# Patient Record
Sex: Female | Born: 1935
Health system: Southern US, Community
[De-identification: ages and names within clinical notes are randomized; demographics above are authoritative.]

## PROBLEM LIST (undated history)

## (undated) DIAGNOSIS — M12819 Other specific arthropathies, not elsewhere classified, unspecified shoulder: Secondary | ICD-10-CM

## (undated) DIAGNOSIS — Z923 Personal history of irradiation: Secondary | ICD-10-CM

## (undated) DIAGNOSIS — M069 Rheumatoid arthritis, unspecified: Secondary | ICD-10-CM

## (undated) DIAGNOSIS — I499 Cardiac arrhythmia, unspecified: Secondary | ICD-10-CM

## (undated) DIAGNOSIS — R51 Headache: Secondary | ICD-10-CM

## (undated) DIAGNOSIS — G709 Myoneural disorder, unspecified: Secondary | ICD-10-CM

## (undated) DIAGNOSIS — K219 Gastro-esophageal reflux disease without esophagitis: Secondary | ICD-10-CM

## (undated) DIAGNOSIS — R42 Dizziness and giddiness: Secondary | ICD-10-CM

## (undated) DIAGNOSIS — J45909 Unspecified asthma, uncomplicated: Secondary | ICD-10-CM

## (undated) DIAGNOSIS — E119 Type 2 diabetes mellitus without complications: Secondary | ICD-10-CM

## (undated) DIAGNOSIS — F419 Anxiety disorder, unspecified: Secondary | ICD-10-CM

## (undated) DIAGNOSIS — I10 Essential (primary) hypertension: Secondary | ICD-10-CM

## (undated) DIAGNOSIS — M75100 Unspecified rotator cuff tear or rupture of unspecified shoulder, not specified as traumatic: Secondary | ICD-10-CM

## (undated) DIAGNOSIS — J309 Allergic rhinitis, unspecified: Secondary | ICD-10-CM

## (undated) DIAGNOSIS — R519 Headache, unspecified: Secondary | ICD-10-CM

## (undated) DIAGNOSIS — M8580 Other specified disorders of bone density and structure, unspecified site: Secondary | ICD-10-CM

## (undated) DIAGNOSIS — I4891 Unspecified atrial fibrillation: Secondary | ICD-10-CM

## (undated) DIAGNOSIS — R569 Unspecified convulsions: Secondary | ICD-10-CM

## (undated) HISTORY — PX: CATARACT EXTRACTION: SUR2

## (undated) HISTORY — DX: Allergic rhinitis, unspecified: J30.9

## (undated) HISTORY — PX: TUBAL LIGATION: SHX77

## (undated) HISTORY — DX: Headache: R51

## (undated) HISTORY — DX: Anxiety disorder, unspecified: F41.9

## (undated) HISTORY — DX: Type 2 diabetes mellitus without complications: E11.9

## (undated) HISTORY — DX: Myoneural disorder, unspecified: G70.9

## (undated) HISTORY — DX: Cardiac arrhythmia, unspecified: I49.9

## (undated) HISTORY — DX: Rheumatoid arthritis, unspecified: M06.9

## (undated) HISTORY — PX: EYE SURGERY: SHX253

## (undated) HISTORY — DX: Headache, unspecified: R51.9

## (undated) HISTORY — DX: Other specified disorders of bone density and structure, unspecified site: M85.80

## (undated) HISTORY — DX: Gastro-esophageal reflux disease without esophagitis: K21.9

## (undated) HISTORY — DX: Essential (primary) hypertension: I10

## (undated) HISTORY — DX: Personal history of irradiation: Z92.3

## (undated) HISTORY — DX: Dizziness and giddiness: R42

## (undated) SURGERY — ESOPHAGOGASTRODUODENOSCOPY (EGD) WITH PROPOFOL
Anesthesia: Monitor Anesthesia Care

---

## 2005-11-24 ENCOUNTER — Emergency Department (HOSPITAL_COMMUNITY): Admission: EM | Admit: 2005-11-24 | Discharge: 2005-11-25 | Payer: Self-pay | Admitting: Emergency Medicine

## 2005-11-25 ENCOUNTER — Encounter: Admission: RE | Admit: 2005-11-25 | Discharge: 2005-11-25 | Payer: Self-pay | Admitting: Gastroenterology

## 2006-01-21 ENCOUNTER — Emergency Department (HOSPITAL_COMMUNITY): Admission: EM | Admit: 2006-01-21 | Discharge: 2006-01-22 | Payer: Self-pay | Admitting: Emergency Medicine

## 2006-04-25 ENCOUNTER — Encounter: Admission: RE | Admit: 2006-04-25 | Discharge: 2006-07-24 | Payer: Self-pay | Admitting: Family Medicine

## 2008-02-19 ENCOUNTER — Encounter: Admission: RE | Admit: 2008-02-19 | Discharge: 2008-02-19 | Payer: Self-pay | Admitting: Family Medicine

## 2008-07-26 ENCOUNTER — Encounter
Admission: RE | Admit: 2008-07-26 | Discharge: 2008-07-26 | Payer: Self-pay | Admitting: Physical Medicine and Rehabilitation

## 2009-05-02 ENCOUNTER — Encounter: Admission: RE | Admit: 2009-05-02 | Discharge: 2009-05-02 | Payer: Self-pay | Admitting: Family Medicine

## 2010-11-04 ENCOUNTER — Other Ambulatory Visit: Payer: Self-pay | Admitting: Family Medicine

## 2010-11-04 DIAGNOSIS — Z1231 Encounter for screening mammogram for malignant neoplasm of breast: Secondary | ICD-10-CM

## 2010-11-26 ENCOUNTER — Ambulatory Visit
Admission: RE | Admit: 2010-11-26 | Discharge: 2010-11-26 | Disposition: A | Discharge status: Home / Self Care | Payer: PRIVATE HEALTH INSURANCE | Source: Ambulatory Visit | Attending: Family Medicine | Admitting: Family Medicine

## 2010-11-26 DIAGNOSIS — Z1231 Encounter for screening mammogram for malignant neoplasm of breast: Secondary | ICD-10-CM

## 2010-12-07 ENCOUNTER — Other Ambulatory Visit: Payer: Self-pay | Admitting: Family Medicine

## 2010-12-07 DIAGNOSIS — R928 Other abnormal and inconclusive findings on diagnostic imaging of breast: Secondary | ICD-10-CM

## 2010-12-22 ENCOUNTER — Ambulatory Visit
Admission: RE | Admit: 2010-12-22 | Discharge: 2010-12-22 | Disposition: A | Discharge status: Home / Self Care | Payer: Medicare Other | Source: Ambulatory Visit | Attending: Family Medicine | Admitting: Family Medicine

## 2010-12-22 DIAGNOSIS — R928 Other abnormal and inconclusive findings on diagnostic imaging of breast: Secondary | ICD-10-CM

## 2011-02-17 DIAGNOSIS — I272 Pulmonary hypertension, unspecified: Secondary | ICD-10-CM | POA: Insufficient documentation

## 2011-02-17 DIAGNOSIS — R079 Chest pain, unspecified: Secondary | ICD-10-CM | POA: Insufficient documentation

## 2011-05-11 ENCOUNTER — Other Ambulatory Visit (HOSPITAL_COMMUNITY)
Admission: RE | Admit: 2011-05-11 | Discharge: 2011-05-11 | Disposition: A | Discharge status: Home / Self Care | Payer: Medicare Other | Source: Ambulatory Visit | Attending: Obstetrics and Gynecology | Admitting: Obstetrics and Gynecology

## 2011-05-11 DIAGNOSIS — Z124 Encounter for screening for malignant neoplasm of cervix: Secondary | ICD-10-CM | POA: Insufficient documentation

## 2011-06-01 ENCOUNTER — Other Ambulatory Visit: Payer: Self-pay | Admitting: Family Medicine

## 2011-06-01 DIAGNOSIS — N6489 Other specified disorders of breast: Secondary | ICD-10-CM

## 2011-06-10 ENCOUNTER — Ambulatory Visit
Admission: RE | Admit: 2011-06-10 | Discharge: 2011-06-10 | Disposition: A | Discharge status: Home / Self Care | Payer: Medicare Other | Source: Ambulatory Visit | Attending: Family Medicine | Admitting: Family Medicine

## 2011-06-10 ENCOUNTER — Other Ambulatory Visit: Payer: Self-pay | Admitting: Family Medicine

## 2011-06-10 DIAGNOSIS — R059 Cough, unspecified: Secondary | ICD-10-CM

## 2011-06-10 DIAGNOSIS — R05 Cough: Secondary | ICD-10-CM

## 2011-06-24 ENCOUNTER — Ambulatory Visit
Admission: RE | Admit: 2011-06-24 | Discharge: 2011-06-24 | Disposition: A | Discharge status: Home / Self Care | Payer: Medicare Other | Source: Ambulatory Visit | Attending: Family Medicine | Admitting: Family Medicine

## 2011-06-24 DIAGNOSIS — N6489 Other specified disorders of breast: Secondary | ICD-10-CM

## 2011-12-22 ENCOUNTER — Other Ambulatory Visit: Payer: Self-pay | Admitting: Family Medicine

## 2011-12-22 DIAGNOSIS — N6489 Other specified disorders of breast: Secondary | ICD-10-CM

## 2011-12-30 ENCOUNTER — Ambulatory Visit
Admission: RE | Admit: 2011-12-30 | Discharge: 2011-12-30 | Disposition: A | Discharge status: Home / Self Care | Payer: Medicare Other | Source: Ambulatory Visit | Attending: Family Medicine | Admitting: Family Medicine

## 2011-12-30 DIAGNOSIS — N6489 Other specified disorders of breast: Secondary | ICD-10-CM

## 2012-07-20 ENCOUNTER — Encounter: Payer: Self-pay | Admitting: *Deleted

## 2012-07-24 ENCOUNTER — Ambulatory Visit (INDEPENDENT_AMBULATORY_CARE_PROVIDER_SITE_OTHER): Payer: Medicare Other | Admitting: Internal Medicine

## 2012-07-24 ENCOUNTER — Encounter: Payer: Self-pay | Admitting: Internal Medicine

## 2012-07-24 VITALS — BP 120/60 | HR 60 | Temp 98.1°F | Ht 62.75 in | Wt 147.0 lb

## 2012-07-24 DIAGNOSIS — J45909 Unspecified asthma, uncomplicated: Secondary | ICD-10-CM

## 2012-07-24 DIAGNOSIS — R05 Cough: Secondary | ICD-10-CM

## 2012-07-24 DIAGNOSIS — J45991 Cough variant asthma: Secondary | ICD-10-CM | POA: Insufficient documentation

## 2012-07-24 DIAGNOSIS — R059 Cough, unspecified: Secondary | ICD-10-CM

## 2012-07-24 DIAGNOSIS — J4520 Mild intermittent asthma, uncomplicated: Secondary | ICD-10-CM

## 2012-07-24 DIAGNOSIS — R058 Other specified cough: Secondary | ICD-10-CM | POA: Insufficient documentation

## 2012-07-24 MED ORDER — FAMOTIDINE 20 MG PO TABS: ORAL_TABLET | ORAL | Status: DC

## 2012-07-24 MED ORDER — PANTOPRAZOLE SODIUM 40 MG PO TBEC: 40.0000 mg | DELAYED_RELEASE_TABLET | Freq: Every day | ORAL | Status: DC

## 2012-07-24 MED ORDER — PREDNISONE (PAK) 10 MG PO TABS: ORAL_TABLET | ORAL | Status: DC

## 2012-07-24 MED ORDER — MOMETASONE FURO-FORMOTEROL FUM 100-5 MCG/ACT IN AERO: INHALATION_SPRAY | RESPIRATORY_TRACT | Status: DC

## 2012-07-24 NOTE — Progress Notes (Signed)
  Subjective:    Patient ID: Lorraine Gilbert, female    DOB: 28-Apr-1932  MRN: [id number]  HPI  31 yf from [place] never smoker with cough and sob x decades previously eval by Dr Jethro Bolus referred by Dr Azucena Cecil 07/24/2012 for further evaluation.  07/24/2012 1st pulmonary eval cc cough x 2 months indolent onset progressively worse that is not better on advair, min prod of white mucus more day than night, assoc with mild sob.  No obvious daytime variabilty or assoc  cp or chest tightness, subjective wheeze overt sinus or hb symptoms. No unusual exp hx or h/o childhood pna/ asthma or knowledge of premature birth.   Sleeping ok without nocturnal  or early am exacerbation  of respiratory  c/o's or need for noct saba. Also denies any obvious fluctuation of symptoms with weather or environmental changes or other aggravating or alleviating factors except as outlined above     Review of Systems  Constitutional: Negative for fever, chills and unexpected weight change.  HENT: Negative for ear pain, nosebleeds, congestion, sore throat, rhinorrhea, sneezing, trouble swallowing, dental problem, voice change, postnasal drip and sinus pressure.   Eyes: Negative for visual disturbance.  Respiratory: Positive for cough and shortness of breath. Negative for choking.   Cardiovascular: Negative for chest pain and leg swelling.  Gastrointestinal: Negative for vomiting, abdominal pain and diarrhea.  Genitourinary: Negative for difficulty urinating.  Musculoskeletal: Negative for arthralgias.  Skin: Negative for rash.  Neurological: Negative for tremors, syncope and headaches.  Hematological: Does not bruise/bleed easily.       Objective:   Physical Exam  Hoarse amb pr female nad Wt Readings from Last 3 Encounters:  07/24/12 147 lb (66.679 kg)     HEENT: nl dentition, turbinates, and orophanx. Nl external ear canals without cough reflex   NECK :  without JVD/Nodes/TM/ nl carotid upstrokes  bilaterally   LUNGS: no acc muscle use, clear to A and P bilaterally without cough on insp or exp maneuvers   CV:  RRR  no s3 or murmur or increase in P2, no edema   ABD:  soft and nontender with nl excursion in the supine position. No bruits or organomegaly, bowel sounds nl  MS:  warm without deformities, calf tenderness, cyanosis or clubbing  SKIN: warm and dry without lesions    NEURO:  alert, depressed,  no deficits    rec xr 07/24/12 > did not go      Assessment & Plan:

## 2012-07-24 NOTE — Patient Instructions (Addendum)
Stop advair  Start dulera 100 Take 2 puffs first thing in am and then another 2 puffs about 12 hours later.     Pantoprazole (protonix) 40 mg  Take 30-60 min before first meal of the day and Pepcid 20 mg one bedtime until return to office - this is the best way to tell whether stomach acid is contributing to your problem.    GERD (REFLUX)  is an extremely common cause of respiratory symptoms, many times with no significant heartburn at all.    It can be treated with medication, but also with lifestyle changes including avoidance of late meals, excessive alcohol, smoking cessation, and avoid fatty foods, chocolate, peppermint, colas, red wine, and acidic juices such as orange juice.  NO MINT OR MENTHOL PRODUCTS SO NO COUGH DROPS  USE SUGARLESS CANDY INSTEAD (jolley ranchers or Stover's)  NO OIL BASED VITAMINS - use powdered substitutes.  If not better return here in 2 weeks all bottles/inhalers from all doctors or return to [facility] and if you want to change to [facility] we will need you to bring your records with you as well

## 2012-07-26 NOTE — Assessment & Plan Note (Addendum)
DDX of  difficult airways managment all start with A and  include Adherence, Ace Inhibitors, Acid Reflux, Active Sinus Disease, Alpha 1 Antitripsin deficiency, Anxiety masquerading as Airways dz,  ABPA,  allergy(esp in young), Aspiration (esp in elderly), Adverse effects of DPI,  Active smokers, plus two Bs  = Bronchiectasis and Beta blocker use..and one C= CHF   Adherence is always the initial "prime suspect" and is a multilayered concern that requires a "trust but verify" approach in every patient - starting with knowing how to use medications, especially inhalers, correctly, keeping up with refills and understanding the fundamental difference between maintenance and prns vs those medications only taken for a very short course and then stopped and not refilled. The proper method of use, as well as anticipated side effects, of a metered-dose inhaler are discussed and demonstrated to the patient. Improved effectiveness after extensive coaching during this visit to a level of approximately  50% so try dulera 100 .2bid x 2 weeks then regroup with med reconciliation  ? Acid reflux in obese pt > max acid suppression plus diet  ? Adverse effect of advair > try hfa  ? Allergies > will need eval from [facility] if she wants to change to here.    Each maintenance medication was reviewed in detail including most importantly the difference between maintenance and as needed and under what circumstances the prns are to be used.  Please see instructions for details which were reviewed in writing and the patient given a copy.

## 2012-08-07 ENCOUNTER — Ambulatory Visit: Payer: Medicare Other | Admitting: Internal Medicine

## 2012-08-25 ENCOUNTER — Ambulatory Visit: Payer: Medicare Other | Admitting: Internal Medicine

## 2013-01-02 ENCOUNTER — Other Ambulatory Visit: Payer: Self-pay

## 2013-01-02 DIAGNOSIS — Z1231 Encounter for screening mammogram for malignant neoplasm of breast: Secondary | ICD-10-CM

## 2013-02-02 ENCOUNTER — Ambulatory Visit
Admission: RE | Admit: 2013-02-02 | Discharge: 2013-02-02 | Disposition: A | Discharge status: Home / Self Care | Payer: Medicare Other | Source: Ambulatory Visit

## 2013-02-02 ENCOUNTER — Other Ambulatory Visit: Payer: Self-pay

## 2013-02-02 DIAGNOSIS — Z1231 Encounter for screening mammogram for malignant neoplasm of breast: Secondary | ICD-10-CM

## 2013-02-21 ENCOUNTER — Other Ambulatory Visit: Payer: Self-pay | Admitting: Gastroenterology

## 2013-02-21 DIAGNOSIS — R1013 Epigastric pain: Secondary | ICD-10-CM

## 2013-02-28 ENCOUNTER — Ambulatory Visit
Admission: RE | Admit: 2013-02-28 | Discharge: 2013-02-28 | Disposition: A | Discharge status: Home / Self Care | Payer: Medicare Other | Source: Ambulatory Visit | Attending: Gastroenterology | Admitting: Gastroenterology

## 2013-02-28 DIAGNOSIS — R1013 Epigastric pain: Secondary | ICD-10-CM

## 2013-06-05 ENCOUNTER — Encounter: Payer: Medicare Other | Attending: Family Medicine

## 2013-06-05 VITALS — Ht 63.0 in | Wt 142.6 lb

## 2013-06-05 DIAGNOSIS — E119 Type 2 diabetes mellitus without complications: Secondary | ICD-10-CM | POA: Insufficient documentation

## 2013-06-05 DIAGNOSIS — Z713 Dietary counseling and surveillance: Secondary | ICD-10-CM | POA: Insufficient documentation

## 2013-06-05 NOTE — Progress Notes (Signed)
Patient was seen on 06/05/13 for the first of a series of three diabetes self-management courses at [facility].  Current HbA1c: 7.7%  The following learning objectives were met by the patient during this class:  Describe diabetes  State some common risk factors for diabetes  Defines the role of glucose and insulin  Identifies type of diabetes and pathophysiology  Describe the relationship between diabetes and cardiovascular risk  State the members of the Healthcare Team  States the rationale for glucose monitoring  State when to test glucose  State their individual Target Range  State the importance of logging glucose readings  Describe how to interpret glucose readings  Identifies A1C target  Explain the correlation between A1c and eAG values  State symptoms and treatment of high blood glucose  State symptoms and treatment of low blood glucose  Explain proper technique for glucose testing  Identifies proper sharps disposal  Handouts given during class include:  Living Well with Diabetes book  Carb Counting and Meal Planning book  Meal Plan Card  Carbohydrate guide  Meal planning worksheet  Low Sodium Flavoring Tips  The diabetes portion plate  T0C to eAG Conversion Chart  Diabetes Medications  Diabetes Recommended Care Schedule  Support Group  Diabetes Success Plan  Core Class Satisfaction Survey  Follow-Up Plan:  Attend core 2

## 2013-06-12 DIAGNOSIS — E119 Type 2 diabetes mellitus without complications: Secondary | ICD-10-CM

## 2013-06-12 DIAGNOSIS — Z713 Dietary counseling and surveillance: Secondary | ICD-10-CM | POA: Diagnosis not present

## 2013-06-12 NOTE — Progress Notes (Signed)

## 2013-06-19 ENCOUNTER — Encounter: Payer: Medicare Other | Attending: Family Medicine

## 2013-06-19 DIAGNOSIS — Z713 Dietary counseling and surveillance: Secondary | ICD-10-CM | POA: Diagnosis present

## 2013-06-19 DIAGNOSIS — E119 Type 2 diabetes mellitus without complications: Secondary | ICD-10-CM

## 2013-06-20 NOTE — Progress Notes (Signed)
Patient was seen on 06/19/13 for the third of a series of three diabetes self-management courses at the [facility]. The following learning objectives were met by the patient during this class:    State the amount of activity recommended for healthy living   Describe activities suitable for individual needs   Identify ways to regularly incorporate activity into daily life   Identify barriers to activity and ways to over come these barriers  Identify diabetes medications being personally used and their primary action for lowering glucose and possible side effects   Describe role of stress on blood glucose and develop strategies to address psychosocial issues   Identify diabetes complications and ways to prevent them  Explain how to manage diabetes during illness   Evaluate success in meeting personal goal   Establish 2-3 goals that they will plan to diligently work on until they return for the  28-monthfollow-up visit  Goals:  Follow Diabetes Meal Plan as instructed  Aim for 15-30 mins of physical activity daily as tolerated  Bring food record and glucose log to your follow up visit  Your patient has established the following 4 month goals in their individualized success plan: I will count my carb choices at most meals and snacks I will increase my activity level at least 15 minutes 7  days a week Reduce my whole milk I will test my glucose at least 2 times a day, 7 days a week I will look at patterns in my record book at least 2 days a month To help manage stress I will go out shopping  Your patient has identified these potential barriers to change:  motivation  finances  Your patient has identified their diabetes self-care support plan as  [facility]  Family Support  Plan:  Attend Core 4 in 4 months

## 2013-10-18 ENCOUNTER — Ambulatory Visit (INDEPENDENT_AMBULATORY_CARE_PROVIDER_SITE_OTHER)
Admission: RE | Admit: 2013-10-18 | Discharge: 2013-10-18 | Disposition: A | Discharge status: Home / Self Care | Payer: Medicare Other | Source: Ambulatory Visit | Attending: Internal Medicine | Admitting: Internal Medicine

## 2013-10-18 ENCOUNTER — Ambulatory Visit (INDEPENDENT_AMBULATORY_CARE_PROVIDER_SITE_OTHER): Payer: Medicare Other | Admitting: Internal Medicine

## 2013-10-18 ENCOUNTER — Encounter: Payer: Self-pay | Admitting: Internal Medicine

## 2013-10-18 VITALS — BP 130/58 | HR 60 | Temp 98.1°F | Ht 63.0 in | Wt 133.2 lb

## 2013-10-18 DIAGNOSIS — R059 Cough, unspecified: Secondary | ICD-10-CM

## 2013-10-18 DIAGNOSIS — J45991 Cough variant asthma: Secondary | ICD-10-CM

## 2013-10-18 DIAGNOSIS — R05 Cough: Secondary | ICD-10-CM

## 2013-10-18 MED ORDER — PANTOPRAZOLE SODIUM 40 MG PO TBEC: DELAYED_RELEASE_TABLET | ORAL | Status: DC

## 2013-10-18 MED ORDER — PREDNISONE 10 MG PO TABS: ORAL_TABLET | ORAL | Status: DC

## 2013-10-18 NOTE — Assessment & Plan Note (Signed)
-   10/18/2013 p extensive coaching HFA effectiveness =    25% at best > resume trial of dulera 100 2bid   Based on inadequate hfa I strongly doubt this dx but will resume dulera until return and I have a chance to review last allergy notes  Probably needs MCT to sort out

## 2013-10-18 NOTE — Assessment & Plan Note (Addendum)
-   Note POS GERD on UGI  02/28/13     The most common causes of chronic cough in immunocompetent adults include the following: upper airway cough syndrome (UACS), previously referred to as postnasal drip syndrome (PNDS), which is caused by variety of rhinosinus conditions; (2) asthma; (3) GERD; (4) chronic bronchitis from cigarette smoking or other inhaled environmental irritants; (5) nonasthmatic eosinophilic bronchitis; and (6) bronchiectasis.   These conditions, singly or in combination, have accounted for up to 94% of the causes of chronic cough in prospective studies.   Other conditions have constituted no >6% of the causes in prospective studies These have included bronchogenic carcinoma, chronic interstitial pneumonia, sarcoidosis, left ventricular failure, ACEI-induced cough, and aspiration from a condition associated with pharyngeal dysfunction.    Chronic cough is often simultaneously caused by more than one condition. A single cause has been found from 38 to 82% of the time, multiple causes from 18 to 62%. Multiply caused cough has been the result of three diseases up to 42% of the time.       Based on hx and exam, this is most likely:  Classic Upper airway cough syndrome, so named because it's frequently impossible to sort out how much is  CR/sinusitis with freq throat clearing (which can be related to primary GERD)   vs  causing  secondary (" extra esophageal")  GERD from wide swings in gastric pressure that occur with throat clearing, often  promoting self use of mint and menthol lozenges that reduce the lower esophageal sphincter tone and exacerbate the problem further in a cyclical fashion.   These are the same pts (now being labeled as having "irritable larynx syndrome" by some cough centers) who not infrequently have a history of having failed to tolerate ace inhibitors,  dry powder inhalers or biphosphonates or report having atypical reflux symptoms that don't respond to standard  doses of PPI (the most likely scenario here)  , and are easily confused as having aecopd or asthma flares by even experienced allergists/ pulmonologists.   The first step is to maximize acid suppression and eliminate cyclical coughing to the extent possible s narcotics Also add h1 hs per guidelines for uacs

## 2013-10-18 NOTE — Patient Instructions (Addendum)
Protonix 40 mg Take 30- 60 min before your first and last meals of the day   Add pepcid 20 mg and chlortrimeton 4 mg x 2 at bedtime both are over the counter  Prednisone 10 mg take  4 each am x 2 days,   2 each am x 2 days,  1 each am x 2 days and stop   Last note from Dr Leanora Cover office  For cough > delsym 2 tsp evey 12 hours   GERD (REFLUX)  is an extremely common cause of respiratory symptoms, many times with no significant heartburn at all.    It can be treated with medication, but also with lifestyle changes including avoidance of late meals, excessive alcohol, smoking cessation, and avoid fatty foods, chocolate, peppermint, colas, red wine, and acidic juices such as orange juice.  NO MINT OR MENTHOL PRODUCTS SO NO COUGH DROPS  USE SUGARLESS CANDY INSTEAD (jolley ranchers or Stover's)  NO OIL BASED VITAMINS - use powdered substitutes. Elevate head of bed if possible  Work on inhaler technique:  relax and gently blow all the way out then take a nice smooth deep breath back in, triggering the inhaler at same time you start breathing in.  Hold for up to 5 seconds if you can.  Rinse and gargle with water when done  See Tammy NP w/in 2 weeks with all your medications, even over the counter meds, separated in two separate bags, the ones you take no matter what vs the ones you stop once you feel better and take only as needed when you feel you need them.   Tammy  will generate for you a new user friendly medication calendar that will put [place] all on the same page re: your medication use.     Without this process, it simply isn't possible to assure that we are providing  your outpatient care  with  the attention to detail we feel you deserve.   If we cannot assure that you're getting that kind of care,  then we cannot manage your problem effectively from this clinic.  Please remember to go to the  x-ray department downstairs for your tests - we will call you with the results when they are  available.     Once you have seen Tammy and we are sure that we're all on the same page with your medication use she will arrange follow up with me.

## 2013-10-18 NOTE — Progress Notes (Signed)
Subjective:    Patient ID: Lorraine Gilbert, female    DOB: 1933-01-05  MRN: [id number]   Brief patient profile:  79  yf from [place] never smoker with cough and sob x decades previously eval by Dr Bernita Buffy referred by Dr Moreen Fowler 07/24/2012 for further evaluation.   History of Present Illness  07/24/2012 1st pulmonary eval cc cough x 2 months indolent onset progressively worse that is not better on advair, min prod of white mucus more day than night, assoc with mild sob. rec Stop advair Start dulera 100 Take 2 puffs first thing in am and then another 2 puffs about 12 hours later.  Pantoprazole (protonix) 40 mg  Take 30-60 min before first meal of the day and Pepcid 20 mg one bedtime until return to office - this is the best way to tell whether stomach acid is contributing to your problem.   GERD   If not better return here in 2 weeks all bottles/inhalers from all doctors or return to [facility] and if you want to change to [facility] we will need you to bring your records with you as well >> did not return as rec    10/18/2013 extended ov/ re-establish care ov/Harvel [place] re: on ppi ac and dulera 100 but extremely poor hfa  Chief Complaint  Patient presents with  . Acute Visit    Pt c/o increased cough x 2 months- prod with minimal clear sputum. Breathing is unchanged since her last visit.   cough is worse p supper and at hs but not waking her once asleep or in am  Not limited by breathing from desired activities    No obvious day to day or daytime variabilty or assoc   cp or chest tightness, subjective wheeze overt sinus or hb symptoms. No unusual exp hx or h/o childhood pna/ asthma or knowledge of premature birth.  Sleeping ok without nocturnal  or early am exacerbation  of respiratory  c/o's or need for noct saba. Also denies any obvious fluctuation of symptoms with weather or environmental changes or other aggravating or alleviating factors except as outlined above   Current  Medications, Allergies, Complete Past Medical History, Past Surgical History, Family History, and Social History were reviewed in [employer] record.  ROS  The following are not active complaints unless bolded sore throat, dysphagia, dental problems, itching, sneezing,  nasal congestion or excess/ purulent secretions, ear ache,   fever, chills, sweats, unintended wt loss, pleuritic or exertional cp, hemoptysis,  orthopnea pnd or leg swelling, presyncope, palpitations, heartburn, abdominal pain, anorexia, nausea, vomiting, diarrhea  or change in bowel or urinary habits, change in stools or urine, dysuria,hematuria,  rash, arthralgias, visual complaints, headache, numbness weakness or ataxia or problems with walking or coordination,  change in mood/affect or memory.                  Objective:   Physical Exam  Hoarse amb pr female nad  Wt Readings from Last 3 Encounters:  10/18/13 133 lb 3.2 oz (60.419 kg)  06/05/13 142 lb 9.6 oz (64.683 kg)  07/24/12 147 lb (66.679 kg)        HEENT: nl dentition, turbinates, and orophanx. Nl external ear canals without cough reflex   NECK :  without JVD/Nodes/TM/ nl carotid upstrokes bilaterally   LUNGS: no acc muscle use, clear to A and P bilaterally without cough on insp or exp maneuvers   CV:  RRR  no s3 or murmur or  increase in P2, no edema   ABD:  soft and nontender with nl excursion in the supine position. No bruits or organomegaly, bowel sounds nl  MS:  warm without deformities, calf tenderness, cyanosis or clubbing  SKIN: warm and dry without lesions    NEURO:  alert, depressed,  no deficits    CXR  10/18/2013 :   The heart size is stable at the upper limits of normal. Heart size is exaggerated by a narrow AP diameter of the chest. The mediastinal contours are stable. There is aortic atherosclerosis. The lungs are clear. There is no pleural effusion. No significant osseous findings are  demonstrated.       Assessment & Plan:

## 2013-10-22 ENCOUNTER — Encounter: Payer: Medicare Other | Attending: Family Medicine

## 2013-10-22 DIAGNOSIS — E119 Type 2 diabetes mellitus without complications: Secondary | ICD-10-CM | POA: Diagnosis present

## 2013-10-22 DIAGNOSIS — Z713 Dietary counseling and surveillance: Secondary | ICD-10-CM | POA: Insufficient documentation

## 2013-10-23 NOTE — Progress Notes (Signed)
Class start time: 9:10   End time: 10:05  Patient was seen on 10/22/2013 for a review of the series of three diabetes self-management courses at the [facility]. The following learning objectives were met by the patient during this class:    Reviewed blood glucose monitoring and interpretation including the recommended target ranges and Hgb A1c.    Reviewed on carb counting, importance of regularly scheduled meals/snacks, and meal planning.    Reviewed the effects of physical activity on glucose levels and long-term glucose control.  Recommended goal of 150 minutes of physical activity/week.   Reviewed patient medications and discussed role of medication on blood glucose and possible side effects.   Discussed strategies to manage stress, psychosocial issues, and other obstacles to diabetes management.   Encouraged moderate weight reduction to improve glucose levels.     Reviewed short-term complications: hyper- and hypo-glycemia.  Discussed causes, symptoms, and treatment options.   Reviewed prevention, detection, and treatment of long-term complications.  Discussed the role of prolonged elevated glucose levels on body systems.  Goals:  Follow Diabetes Meal Plan as instructed  Eat 3 meals and 2 snacks, every 3-5 hrs  Limit carbohydrate intake to 45 grams carbohydrate/meal Limit carbohydrate intake to 15 grams carbohydrate/snack Add lean protein foods to meals/snacks  Monitor glucose levels as instructed by your doctor  Aim for goal of 15-30 mins of physical activity daily as tolerated  Bring food record and glucose log to your next nutrition visit

## 2013-11-01 ENCOUNTER — Encounter: Payer: Self-pay | Admitting: Adult Health

## 2013-11-01 ENCOUNTER — Ambulatory Visit (INDEPENDENT_AMBULATORY_CARE_PROVIDER_SITE_OTHER): Payer: Medicare Other | Admitting: Adult Health

## 2013-11-01 VITALS — BP 132/56 | HR 54 | Temp 96.9°F | Ht 63.0 in | Wt 135.8 lb

## 2013-11-01 DIAGNOSIS — M069 Rheumatoid arthritis, unspecified: Secondary | ICD-10-CM

## 2013-11-01 DIAGNOSIS — J45991 Cough variant asthma: Secondary | ICD-10-CM

## 2013-11-01 NOTE — Patient Instructions (Signed)
Follow med calendar closely and bring to each visit.  May add Chlorpheniramine 4mg  2 at bedtime as needed for drainage/throat clearing .  GERD diet  Follow up Dr. Melvyn Novas  In 4-6 weeks with PFT and As needed   Please contact office for sooner follow up if symptoms do not improve or worsen or seek emergency care

## 2013-11-01 NOTE — Progress Notes (Signed)
Subjective:    Patient ID: Lorraine Gilbert, female    DOB: 06/09/1932  MRN: [id number]   Brief patient profile:  69  yf from [place] never smoker with cough and sob x decades previously eval by Dr Bernita Buffy referred by Dr Moreen Fowler 07/24/2012 for further evaluation.   History of Present Illness  07/24/2012 1st pulmonary eval cc cough x 2 months indolent onset progressively worse that is not better on advair, min prod of white mucus more day than night, assoc with mild sob. rec Stop advair Start dulera 100 Take 2 puffs first thing in am and then another 2 puffs about 12 hours later.  Pantoprazole (protonix) 40 mg  Take 30-60 min before first meal of the day and Pepcid 20 mg one bedtime until return to office - this is the best way to tell whether stomach acid is contributing to your problem.   GERD   If not better return here in 2 weeks all bottles/inhalers from all doctors or return to [facility] and if you want to change to [facility] we will need you to bring your records with you as well >> did not return as rec    10/18/2013 extended ov/ re-establish care ov/[place] re: on ppi ac and dulera 100 but extremely poor hfa  Chief Complaint  Patient presents with  . Acute Visit    Pt c/o increased cough x 2 months- prod with minimal clear sputum. Breathing is unchanged since her last visit.   cough is worse p supper and at hs but not waking her once asleep or in am Not limited by breathing from desired activities   >steroid  Taper , cough trigger regimen w/ PPI and H2  blocker and H1 antihistamine .   11/01/2013 Follow up and Med review  Pt returns for 2 week follow up for cough .  She was seen last week for return of cough . She was given steroid taper and strarted on PPI and Pepcid  Chlortrimeton added to bedtime.  CXR last ov with no acute process .  She return feeling much better. Says cough is totally gone.  We reviewed all her meds and organized them into a  Med calendar with  pt education .  She did not start on pepcid as recommended from last ov.  Nor did she add chlortabs .  Denies chest pain,orthopnea, edema, fever or chest pain.  Of note she does have RA on MTX . This was not on her med list from last ov.      Current Medications, Allergies, Complete Past Medical History, Past Surgical History, Family History, and Social History were reviewed in [employer] record.  ROS  The following are not active complaints unless bolded sore throat, dysphagia, dental problems, itching, sneezing,  nasal congestion or excess/ purulent secretions, ear ache,   fever, chills, sweats, unintended wt loss, pleuritic or exertional cp, hemoptysis,  orthopnea pnd or leg swelling, presyncope, palpitations, heartburn, abdominal pain, anorexia, nausea, vomiting, diarrhea  or change in bowel or urinary habits, change in stools or urine, dysuria,hematuria,  rash, arthralgias, visual complaints, headache, numbness weakness or ataxia or problems with walking or coordination,  change in mood/affect or memory.                  Objective:   Physical Exam   amb pr female nad       HEENT: nl dentition, turbinates, and orophanx. Nl external ear canals without cough reflex  NECK :  without JVD/Nodes/TM/ nl carotid upstrokes bilaterally   LUNGS: no acc muscle use, clear to A and P bilaterally without cough on insp or exp maneuvers   CV:  RRR  no s3 or murmur or increase in P2, no edema   ABD:  soft and nontender with nl excursion in the supine position. No bruits or organomegaly, bowel sounds nl  MS:  warm without deformities, calf tenderness, cyanosis or clubbing  SKIN: warm and dry without lesions    NEURO:  alert, depressed,  no deficits    CXR  10/18/2013 :   The heart size is stable at the upper limits of normal. Heart size is exaggerated by a narrow AP diameter of the chest. The mediastinal contours are stable. There is aortic atherosclerosis.  The lungs are clear. There is no pleural effusion. No significant osseous findings are demonstrated.       Assessment & Plan:

## 2013-11-01 NOTE — Assessment & Plan Note (Addendum)
Recent flare with Upper airway cough now improved after steroid taper CXR is unrevealing.  Of note she does have RA on MTX  -CXR w/out evidence sign of toxicity and cough resolved with short steroid burst Will follow closely for cough return  Need PFTs  Patient's medications were reviewed today and patient education was given. Computerized medication calendar was adjusted/completed    Plan   Follow med calendar closely and bring to each visit.  May add Chlorpheniramine 4mg  2 at bedtime as needed for drainage/throat clearing .  GERD diet  Follow up Dr. Melvyn Novas  In 4-6 weeks with PFT and As needed   Please contact office for sooner follow up if symptoms do not improve or worsen or seek emergency care

## 2013-11-02 NOTE — Progress Notes (Signed)
Chart and ov notes reviewed and agree with a/p

## 2013-11-30 ENCOUNTER — Ambulatory Visit: Payer: Medicare Other | Admitting: Internal Medicine

## 2013-12-03 ENCOUNTER — Other Ambulatory Visit: Payer: Self-pay | Admitting: Internal Medicine

## 2013-12-03 DIAGNOSIS — R0600 Dyspnea, unspecified: Secondary | ICD-10-CM

## 2013-12-04 ENCOUNTER — Ambulatory Visit (INDEPENDENT_AMBULATORY_CARE_PROVIDER_SITE_OTHER): Payer: Medicare Other | Admitting: Internal Medicine

## 2013-12-04 ENCOUNTER — Encounter: Payer: Self-pay | Admitting: Internal Medicine

## 2013-12-04 VITALS — BP 132/60 | HR 67 | Ht 61.0 in | Wt 132.0 lb

## 2013-12-04 DIAGNOSIS — R05 Cough: Secondary | ICD-10-CM

## 2013-12-04 DIAGNOSIS — M069 Rheumatoid arthritis, unspecified: Secondary | ICD-10-CM

## 2013-12-04 DIAGNOSIS — R059 Cough, unspecified: Secondary | ICD-10-CM

## 2013-12-04 DIAGNOSIS — J45991 Cough variant asthma: Secondary | ICD-10-CM

## 2013-12-04 DIAGNOSIS — R0600 Dyspnea, unspecified: Secondary | ICD-10-CM

## 2013-12-04 LAB — PULMONARY FUNCTION TEST
DL/VA % pred: 101 %
DL/VA: 4.48 ml/min/mmHg/L
DLCO unc % pred: 79 %
DLCO unc: 16.14 ml/min/mmHg
FEF 25-75 PRE: 2.09 L/s
FEF 25-75 Post: 1.53 L/sec
FEF2575-%CHANGE-POST: -26 %
FEF2575-%PRED-POST: 121 %
FEF2575-%Pred-Pre: 165 %
FEV1-%CHANGE-POST: -5 %
FEV1-%PRED-POST: 97 %
FEV1-%PRED-PRE: 103 %
FEV1-PRE: 1.74 L
FEV1-Post: 1.64 L
FEV1FVC-%Change-Post: 2 %
FEV1FVC-%Pred-Pre: 112 %
FEV6-%Change-Post: -8 %
FEV6-%PRED-POST: 89 %
FEV6-%Pred-Pre: 97 %
FEV6-PRE: 2.08 L
FEV6-Post: 1.9 L
FEV6FVC-%Change-Post: 0 %
FEV6FVC-%PRED-PRE: 106 %
FEV6FVC-%Pred-Post: 106 %
FVC-%Change-Post: -8 %
FVC-%Pred-Post: 84 %
FVC-%Pred-Pre: 91 %
FVC-POST: 1.91 L
FVC-Pre: 2.08 L
POST FEV1/FVC RATIO: 86 %
Post FEV6/FVC ratio: 100 %
Pre FEV1/FVC ratio: 83 %
Pre FEV6/FVC Ratio: 100 %
RV % PRED: 102 %
RV: 2.28 L
TLC % PRED: 100 %
TLC: 4.64 L

## 2013-12-04 NOTE — Patient Instructions (Signed)
Continue the protonix 40 mg Take 30-60 min before first meal of the day  And  if you start coughing for any reason>>add pepcid ac 20 mg at bedtime  Ok to leave off dulera to see what difference it makes   If you are satisfied with your treatment plan,  let your doctor know and he/she can either refill your medications or you can return here when your prescription runs out.     If in any way you are not 100% satisfied,  please tell us.  If 100% better, tell your friends!  Pulmonary follow up is as needed

## 2013-12-04 NOTE — Progress Notes (Signed)
PFT done today. 

## 2013-12-04 NOTE — Progress Notes (Signed)
Subjective:    Patient ID: Lorraine Gilbert, female    DOB: 1932/02/25  MRN: [id number]   Brief patient profile:  49  yf from [place] never smoker with cough and sob x decades previously eval by Dr Bernita Buffy referred by Dr Moreen Fowler 07/24/2012 for further evaluation of sob and cough with perfectly nl pfts 12/04/2013    History of Present Illness  07/24/2012 1st pulmonary eval cc cough x 2 months indolent onset progressively worse that is not better on advair, min prod of white mucus more day than night, assoc with mild sob. rec Stop advair Start dulera 100 Take 2 puffs first thing in am and then another 2 puffs about 12 hours later.  Pantoprazole (protonix) 40 mg  Take 30-60 min before first meal of the day and Pepcid 20 mg one bedtime until return to office - this is the best way to tell whether stomach acid is contributing to your problem.   GERD   If not better return here in 2 weeks all bottles/inhalers from all doctors or return to [facility] and if you want to change to [facility] we will need you to bring your records with you as well >> did not return as rec    10/18/2013 extended ov/ re-establish care ov/Kourtnei Rauber re: on ppi ac and dulera 100 but extremely poor hfa  Chief Complaint  Patient presents with  . Acute Visit    Pt c/o increased cough x 2 months- prod with minimal clear sputum. Breathing is unchanged since her last visit.   cough is worse p supper and at hs but not waking her once asleep or in am Not limited by breathing from desired activities   >steroid  Taper , cough trigger regimen w/ PPI and H2  blocker and H1 antihistamine .   11/01/2013 Follow up and Med review  Pt returns for 2 week follow up for cough .  She was seen last week for return of cough . She was given steroid taper and strarted on PPI and Pepcid  Chlortrimeton added to bedtime.  CXR last ov with no acute process .  She return feeling much better. Says cough is totally gone.  We reviewed all her  meds and organized them into a  Med calendar with pt education .  She did not start on pepcid as recommended from last ov.  Nor did she add chlortabs .  Denies chest pain,orthopnea, edema, fever or chest pain.  Of note she does have RA on MTX . This was not on her med list from last ov.  rec No change rx   12/04/2013 f/u ov/Chanta Bauers re: f/u pfts off dulera > 12 hours with nl pfts  Chief Complaint  Patient presents with  . Follow-up    Pt states that her breathing is unchanged. Her cough has resolved. No new co's today.     Swayne/ hawkes following  Not limited by breathing from desired activities  But rather by arthritis and fatigue   No obvious day to day or daytime variabilty or assoc chronic cough or cp or chest tightness, subjective wheeze overt sinus or hb symptoms. No unusual exp hx or h/o childhood pna/ asthma or knowledge of premature birth.  Sleeping ok without nocturnal  or early am exacerbation  of respiratory  c/o's or need for noct saba. Also denies any obvious fluctuation of symptoms with weather or environmental changes or other aggravating or alleviating factors except as outlined above   Current Medications,  Allergies, Complete Past Medical History, Past Surgical History, Family History, and Social History were reviewed in [employer] record.  ROS  The following are not active complaints unless bolded sore throat, dysphagia, dental problems, itching, sneezing,  nasal congestion or excess/ purulent secretions, ear ache,   fever, chills, sweats, unintended wt loss, pleuritic or exertional cp, hemoptysis,  orthopnea pnd or leg swelling, presyncope, palpitations, heartburn, abdominal pain, anorexia, nausea, vomiting, diarrhea  or change in bowel or urinary habits, change in stools or urine, dysuria,hematuria,  rash, arthralgias, visual complaints, headache, numbness weakness or ataxia or problems with walking or coordination,  change in mood/affect or memory.                       Objective:   Physical Exam   amb pr female nad   Wt Readings from Last 3 Encounters:  12/04/13 132 lb (59.875 kg)  11/01/13 135 lb 12.8 oz (61.598 kg)  10/18/13 133 lb 3.2 oz (60.419 kg)    Vital signs reviewed       HEENT: nl dentition, turbinates, and orophanx. Nl external ear canals without cough reflex   NECK :  without JVD/Nodes/TM/ nl carotid upstrokes bilaterally   LUNGS: no acc muscle use, clear to A and P bilaterally without cough on insp or exp maneuvers   CV:  RRR  no s3 or murmur or increase in P2, no edema   ABD:  soft and nontender with nl excursion in the supine position. No bruits or organomegaly, bowel sounds nl  MS:  warm without deformities, calf tenderness, cyanosis or clubbing     CXR  10/18/2013 :   The heart size is stable at the upper limits of normal. Heart size is exaggerated by a narrow AP diameter of the chest. The mediastinal contours are stable. There is aortic atherosclerosis. The lungs are clear. There is no pleural effusion. No significant osseous findings are demonstrated.       Assessment & Plan:    Updated Medication List Outpatient Encounter Prescriptions as of 12/04/2013  Medication Sig  . apixaban (ELIQUIS) 5 MG TABS tablet Take 5 mg by mouth 2 (two) times daily.  Marland Kitchen aspirin 81 MG tablet Take 81 mg by mouth daily.  . digoxin (LANOXIN) 0.125 MG tablet Take 125 mcg by mouth daily.  Marland Kitchen diltiazem (DILACOR XR) 240 MG 24 hr capsule Take 240 mg by mouth daily.  . ergocalciferol (VITAMIN D2) 50000 UNITS capsule Take 50,000 Units by mouth once a week.  . escitalopram (LEXAPRO) 10 MG tablet Take 10 mg by mouth daily.  . folic acid (FOLVITE) 1 MG tablet Take 1 mg by mouth daily.  . Methotrexate Sodium (METHOTREXATE PO) Take 9 tablets by mouth every 7 (seven) days.  . mometasone-formoterol (DULERA) 100-5 MCG/ACT AERO Take 2 puffs first thing in am and then another 2 puffs about 12 hours later.  .  pantoprazole (PROTONIX) 40 MG tablet Take 30- 60 min before your first and last meals of the day (Patient taking differently: Take 30- 60 min before your first meal of the day)  . [DISCONTINUED] predniSONE (DELTASONE) 10 MG tablet Take  4 each am x 2 days,   2 each am x 2 days,  1 each am x 2 days and stop

## 2013-12-05 NOTE — Assessment & Plan Note (Signed)
-   Note POS GERD on UGI  02/28/13   Therefore rec continue gerd rx indefinitely

## 2013-12-05 NOTE — Assessment & Plan Note (Signed)
On Methotrexate per Dr Trudie Reed as of ov 12/04/13 - PFT's wnl 12/04/13 with dlco 79%

## 2013-12-05 NOTE — Assessment & Plan Note (Signed)
-   10/18/2013 p extensive coaching HFA effectiveness =    25% at best > resume trial of dulera 100 2bid  -Med calendar 11/01/2013 > did not bring as instructed 12/04/13 - PFTs wnl 12/04/13 > ok to try off dulera

## 2014-01-28 ENCOUNTER — Other Ambulatory Visit: Payer: Self-pay | Admitting: Gastroenterology

## 2014-01-28 DIAGNOSIS — R1084 Generalized abdominal pain: Secondary | ICD-10-CM

## 2014-02-01 ENCOUNTER — Ambulatory Visit
Admission: RE | Admit: 2014-02-01 | Discharge: 2014-02-01 | Disposition: A | Discharge status: Home / Self Care | Payer: Medicare Other | Source: Ambulatory Visit | Attending: Gastroenterology | Admitting: Gastroenterology

## 2014-02-01 DIAGNOSIS — R1084 Generalized abdominal pain: Secondary | ICD-10-CM

## 2014-02-04 ENCOUNTER — Other Ambulatory Visit: Payer: Self-pay

## 2014-02-04 DIAGNOSIS — Z1231 Encounter for screening mammogram for malignant neoplasm of breast: Secondary | ICD-10-CM

## 2014-02-12 ENCOUNTER — Ambulatory Visit
Admission: RE | Admit: 2014-02-12 | Discharge: 2014-02-12 | Disposition: A | Discharge status: Home / Self Care | Payer: Medicare Other | Source: Ambulatory Visit

## 2014-02-12 DIAGNOSIS — Z1231 Encounter for screening mammogram for malignant neoplasm of breast: Secondary | ICD-10-CM

## 2014-02-13 ENCOUNTER — Ambulatory Visit: Payer: Medicare Other

## 2014-03-25 ENCOUNTER — Other Ambulatory Visit: Payer: Self-pay | Admitting: Family Medicine

## 2014-03-25 ENCOUNTER — Ambulatory Visit
Admission: RE | Admit: 2014-03-25 | Discharge: 2014-03-25 | Disposition: A | Discharge status: Home / Self Care | Payer: Medicare Other | Source: Ambulatory Visit | Attending: Family Medicine | Admitting: Family Medicine

## 2014-03-25 DIAGNOSIS — R05 Cough: Secondary | ICD-10-CM

## 2014-03-25 DIAGNOSIS — R059 Cough, unspecified: Secondary | ICD-10-CM

## 2014-04-30 ENCOUNTER — Encounter: Payer: Self-pay | Admitting: Dietician

## 2014-04-30 ENCOUNTER — Encounter: Payer: Medicare Other | Attending: Family Medicine | Admitting: Dietician

## 2014-04-30 VITALS — Ht 63.0 in | Wt 137.0 lb

## 2014-04-30 DIAGNOSIS — Z713 Dietary counseling and surveillance: Secondary | ICD-10-CM | POA: Diagnosis not present

## 2014-04-30 DIAGNOSIS — E119 Type 2 diabetes mellitus without complications: Secondary | ICD-10-CM | POA: Insufficient documentation

## 2014-04-30 NOTE — Progress Notes (Signed)
  Medical Nutrition Therapy:  Appt start time: 1400 end time:  1500.   Assessment:  Primary concerns today: Patient would like to learn more about diabetes and how to control her blood sugar.  CBG checked bid.  AM CGB is 120-150,   CBG 2 hours after dinner is 160.  HgbA1C 7.1% 03/25/14.  Patient has had diabetes for about 1 year.  She came to classes here at that time.    Patient lives with husband, son, daughter in [place].  Son has diabetes.Patient does all of the cooking and shopping.   Preferred Learning Style:   No preference indicated   Learning Readiness:   Not ready  Contemplating  Ready  Change in progress   MEDICATIONS: see list   DIETARY INTAKE: 24-hr recall:  B (6-7 AM): white bread and coffee  Snk (9-10 AM): grits or cereal, sometimes eggs or [place] bacon L ( PM): seldom: fruit or toast with peanut butter and jelly Snk ( PM): none D (5PM): potato, yams, rice, or pasta, meat, vegetables and salad Snk ( PM): occasional pastry if blood sugar is OK Beverages: coffee with cream and sugar sub, water, OJ rarely  Usual physical activity: cleans her own house, walks 3 times per week for 30 minutes  Estimated energy needs: 1400 calories 158 g carbohydrates 105 g protein 39 g fat  Progress Towards Goal(s):  In progress.   Nutritional Diagnosis:  NB-1.1 Food and nutrition-related knowledge deficit As related to balance of carbohydrate, protein, and fat.  As evidenced by patient report.    Intervention:  Nutrition counseling and diabetes education initiated. Discussed Carb Counting by food group as method of portion control, reading food labels, and benefits of increased activity. Also discussed basic physiology of Diabetes, target BG ranges pre and post meals, and A1c.  . Plan:  Aim for 2 Carb Choices per meal (30 grams) +/- 1 either way  Aim for 0-2 Carbs per snack if hungry  Include protein in moderation with your meals and snacks Consider reading food labels for  Total Carbohydrate and Fat Grams of foods Consider  increasing your activity level by walking for 30 minutes daily as tolerated Consider checking BG at alternate times per day as directed by MD     Teaching Method Utilized:  Visual Auditory Hands on  Handouts given during visit include:  In Spanish  Living Well with Diabetes  My Plate  Label reading (English)  Meal plan card  Snack list (English  Barriers to learning/adherence to lifestyle change: none  Demonstrated degree of understanding via:  Teach Back   Monitoring/Evaluation:  Dietary intake, exercise, label reading, and body weight prn.

## 2014-04-30 NOTE — Patient Instructions (Signed)
Plan:  Aim for 2 Carb Choices per meal (30 grams) +/- 1 either way  Aim for 0-2 Carbs per snack if hungry  Include protein in moderation with your meals and snacks Consider reading food labels for Total Carbohydrate and Fat Grams of foods Consider  increasing your activity level by walking for 30 minutes daily as tolerated Consider checking BG at alternate times per day as directed by MD

## 2014-08-14 ENCOUNTER — Encounter: Payer: Self-pay | Admitting: Internal Medicine

## 2014-08-14 ENCOUNTER — Ambulatory Visit (INDEPENDENT_AMBULATORY_CARE_PROVIDER_SITE_OTHER): Payer: Medicare Other | Admitting: Internal Medicine

## 2014-08-14 VITALS — BP 132/58 | HR 71 | Ht 63.0 in | Wt 138.0 lb

## 2014-08-14 DIAGNOSIS — J45991 Cough variant asthma: Secondary | ICD-10-CM

## 2014-08-14 MED ORDER — MOMETASONE FURO-FORMOTEROL FUM 100-5 MCG/ACT IN AERO: INHALATION_SPRAY | RESPIRATORY_TRACT | Status: DC

## 2014-08-14 MED ORDER — PREDNISONE 10 MG PO TABS: ORAL_TABLET | ORAL | Status: DC

## 2014-08-14 NOTE — Progress Notes (Signed)
Subjective:    Patient ID: Lorraine Gilbert, female    DOB: 1932-10-18  MRN: [id number]   Brief patient profile:  54  yf from [place] never smoker with cough and sob x decades previously eval by Dr Bernita Buffy referred by Dr Moreen Fowler 07/24/2012 for further evaluation of sob and cough with perfectly nl pfts 12/04/2013    History of Present Illness  07/24/2012 1st pulmonary eval cc cough x 2 months indolent onset progressively worse that is not better on advair, min prod of white mucus more day than night, assoc with mild sob. rec Stop advair Start dulera 100 Take 2 puffs first thing in am and then another 2 puffs about 12 hours later.  Pantoprazole (protonix) 40 mg  Take 30-60 min before first meal of the day and Pepcid 20 mg one bedtime until return to office - this is the best way to tell whether stomach acid is contributing to your problem.   GERD   If not better return here in 2 weeks all bottles/inhalers from all doctors or return to [facility] and if you want to change to [facility] we will need you to bring your records with you as well >> did not return as rec    10/18/2013 extended ov/ re-establish care ov/Wert re: on ppi ac and dulera 100 but extremely poor hfa  Chief Complaint  Patient presents with  . Acute Visit    Pt c/o increased cough x 2 months- prod with minimal clear sputum. Breathing is unchanged since her last visit.   cough is worse p supper and at hs but not waking her once asleep or in am Not limited by breathing from desired activities   >steroid  Taper , cough trigger regimen w/ PPI and H2  blocker and H1 antihistamine .   11/01/2013 Follow up and Med review  Pt returns for 2 week follow up for cough .  She was seen last week for return of cough . She was given steroid taper and strarted on PPI and Pepcid  Chlortrimeton added to bedtime.  CXR last ov with no acute process .  She return feeling much better. Says cough is totally gone.  We reviewed all her  meds and organized them into a  Med calendar with pt education .  She did not start on pepcid as recommended from last ov.  Nor did she add chlortabs .  Denies chest pain,orthopnea, edema, fever or chest pain.  Of note she does have RA on MTX . This was not on her med list from last ov.  rec No change rx     12/04/2013 f/u ov/Wert re: f/u pfts off dulera > 12 hours with nl pfts  Chief Complaint  Patient presents with  . Follow-up    Pt states that her breathing is unchanged. Her cough has resolved. No new co's today.     Swayne/ hawkes following rec Continue the protonix 40 mg Take 30-60 min before first meal of the day  And  if you start coughing for any reason>>add pepcid ac 20 mg at bedtime Ok to leave off dulera to see what difference it makes   08/14/2014 f/u ov/Wert re: ? Cough variant asthma  Chief Complaint  Patient presents with  . Acute Visit    Pt c/o cough x 3 wks- prod at times with minimal clear sputum.   cough worse at hs not taking pepcid dulera empty x 2 days ? If really helping as hfa  very poor and did fine off all resp rx x month prior to this acute flare x 3 weeks    Not limited by breathing from desired activities  But rather by arthritis and fatigue   No obvious day to day or daytime variabilty or assoc chronic cough or cp or chest tightness, subjective wheeze overt sinus or hb symptoms. No unusual exp hx or h/o childhood pna/ asthma or knowledge of premature birth.  Sleeping ok without nocturnal  or early am exacerbation  of respiratory  c/o's or need for noct saba. Also denies any obvious fluctuation of symptoms with weather or environmental changes or other aggravating or alleviating factors except as outlined above   Current Medications, Allergies, Complete Past Medical History, Past Surgical History, Family History, and Social History were reviewed in [employer] record.  ROS  The following are not active complaints unless  bolded sore throat, dysphagia, dental problems, itching, sneezing,  nasal congestion or excess/ purulent secretions, ear ache,   fever, chills, sweats, unintended wt loss, pleuritic or exertional cp, hemoptysis,  orthopnea pnd or leg swelling, presyncope, palpitations, heartburn, abdominal pain, anorexia, nausea, vomiting, diarrhea  or change in bowel or urinary habits, change in stools or urine, dysuria,hematuria,  rash, arthralgias, visual complaints, headache, numbness weakness or ataxia or problems with walking or coordination,  change in mood/affect or memory.            Objective:   Physical Exam   amb pr female nad   08/14/2014        138  Wt Readings from Last 3 Encounters:  12/04/13 132 lb (59.875 kg)  11/01/13 135 lb 12.8 oz (61.598 kg)  10/18/13 133 lb 3.2 oz (60.419 kg)    Vital signs reviewed       HEENT: nl dentition, turbinates, and orophanx. Nl external ear canals without cough reflex   NECK :  without JVD/Nodes/TM/ nl carotid upstrokes bilaterally   LUNGS: no acc muscle use, clear to A and P bilaterally without cough on insp or exp maneuvers   CV:  RRR  no s3 or murmur or increase in P2, no edema   ABD:  soft and nontender with nl excursion in the supine position. No bruits or organomegaly, bowel sounds nl  MS:  warm without deformities, calf tenderness, cyanosis or clubbing       I personally reviewed images and agree with radiology impression as follows:  CXR:  03/25/14  1. Mild cardiomegaly, no pulmonary venous congestion. 2. No acute pulmonary disease. Chest is stable prior exam.       Assessment & Plan:

## 2014-08-14 NOTE — Patient Instructions (Addendum)
Continue the protonix 40 mg Take 30-60 min before first meal of the day  And  add pepcid ac 20 mg at bedtime  GERD (REFLUX)  is an extremely common cause of respiratory symptoms just like yours , many times with no obvious heartburn at all.    It can be treated with medication, but also with lifestyle changes including elevation of the head of your bed (ideally with 6 inch  bed blocks),  Smoking cessation, avoidance of late meals, excessive alcohol, and avoid fatty foods, chocolate, peppermint, colas, red wine, and acidic juices such as orange juice.  NO MINT OR MENTHOL PRODUCTS SO NO COUGH DROPS  USE SUGARLESS CANDY INSTEAD (Jolley ranchers or Stover's or Life Savers) or even ice chips will also do - the key is to swallow to prevent all throat clearing. NO OIL BASED VITAMINS - use powdered substitutes.    Resume dulera 100 Take 2 puffs first thing in am and then another 2 puffs about 12 hours later.   Work on inhaler technique:  relax and gently blow all the way out then take a nice smooth deep breath back in, triggering the inhaler at same time you start breathing in.  Hold for up to 5 seconds if you can.  Rinse and gargle with water when done  Prednisone 10 mg take  4 each am x 2 days,   2 each am x 2 days,  1 each am x 2 days and stop   If not satisfied return with all mediations in hand

## 2014-08-19 ENCOUNTER — Encounter: Payer: Self-pay | Admitting: Internal Medicine

## 2014-08-19 NOTE — Assessment & Plan Note (Addendum)
-   10/18/2013 p extensive coaching HFA effectiveness =    25% at best > resume trial of dulera 100 2bid  -Med calendar 11/01/2013 > did not bring as instructed 12/04/13 - PFTs wnl 12/04/13 > ok to try off dulera  DDX of  difficult airways management all start with A and  include Adherence, Ace Inhibitors, Acid Reflux, Active Sinus Disease, Alpha 1 Antitripsin deficiency, Anxiety masquerading as Airways dz,  ABPA,  allergy(esp in young), Aspiration (esp in elderly), Adverse effects of meds,  Active smokers, A bunch of PE's (a small clot burden can't cause this syndrome unless there is already severe underlying pulm or vascular dz with poor reserve) plus two Bs  = Bronchiectasis and Beta blocker use..and one C= CHF  Adherence is always the initial "prime suspect" and is a multilayered concern that requires a "trust but verify" approach in every patient - starting with knowing how to use medications, especially inhalers, correctly, keeping up with refills and understanding the fundamental difference between maintenance and prns vs those medications only taken for a very short course and then stopped and not refilled.  - dulera count was on 0 and she was not aware of this  - The proper method of use, as well as anticipated side effects, of a metered-dose inhaler are discussed and demonstrated to the patient. Improved effectiveness after extensive coaching during this visit to a level of approximately  75% though baseline   ? Acid (or non-acid) GERD > always difficult to exclude as up to 75% of pts in some series report no assoc GI/ Heartburn symptoms> rec max (24h)  acid suppression and diet restrictions/ reviewed and instructions given in writing.   ? Allergy > Prednisone 10 mg take  4 each am x 2 days,   2 each am x 2 days,  1 each am x 2 days and stop   If not improving needs to return with all meds in hand  I had an extended discussion with the patient  through interpreter reviewing all relevant  studies completed to date and  lasting 15 to 20 minutes of a 25 minute visit    Each maintenance medication was reviewed in detail including most importantly the difference between maintenance and prns and under what circumstances the prns are to be triggered using an action plan format that is not reflected in the computer generated alphabetically organized AVS.    Please see instructions for details which were reviewed in writing and the patient given a copy highlighting the part that I personally wrote and discussed at today's ov.

## 2014-08-20 ENCOUNTER — Ambulatory Visit (INDEPENDENT_AMBULATORY_CARE_PROVIDER_SITE_OTHER): Payer: Medicare Other | Admitting: Neurology

## 2014-08-20 ENCOUNTER — Telehealth: Payer: Self-pay | Admitting: Family Medicine

## 2014-08-20 ENCOUNTER — Encounter: Payer: Self-pay | Admitting: Neurology

## 2014-08-20 ENCOUNTER — Other Ambulatory Visit (INDEPENDENT_AMBULATORY_CARE_PROVIDER_SITE_OTHER): Payer: Medicare Other

## 2014-08-20 VITALS — BP 110/50 | HR 67 | Resp 16 | Ht 62.0 in | Wt 136.0 lb

## 2014-08-20 DIAGNOSIS — I4891 Unspecified atrial fibrillation: Secondary | ICD-10-CM

## 2014-08-20 DIAGNOSIS — I10 Essential (primary) hypertension: Secondary | ICD-10-CM | POA: Diagnosis not present

## 2014-08-20 DIAGNOSIS — E785 Hyperlipidemia, unspecified: Secondary | ICD-10-CM | POA: Diagnosis not present

## 2014-08-20 DIAGNOSIS — G4485 Primary stabbing headache: Secondary | ICD-10-CM | POA: Diagnosis not present

## 2014-08-20 DIAGNOSIS — R413 Other amnesia: Secondary | ICD-10-CM | POA: Diagnosis not present

## 2014-08-20 DIAGNOSIS — E1165 Type 2 diabetes mellitus with hyperglycemia: Secondary | ICD-10-CM

## 2014-08-20 DIAGNOSIS — IMO0002 Reserved for concepts with insufficient information to code with codable children: Secondary | ICD-10-CM | POA: Insufficient documentation

## 2014-08-20 LAB — TSH: TSH: 2.17 u[IU]/mL (ref 0.35–4.50)

## 2014-08-20 LAB — VITAMIN B12: Vitamin B-12: 413 pg/mL (ref 211–911)

## 2014-08-20 NOTE — Telephone Encounter (Signed)
Called patient to give her MRI brain appt information. Scheduled at [facility] on 08/27/14 arrival time 10:00.

## 2014-08-20 NOTE — Patient Instructions (Addendum)
1. Schedule open MRI without contrast 2. Recent bloodwork from PCP will be requested for review, if not done, TSH and B12 will be ordered 3. Option for starting Aricept 5mg  daily 4. Physical exercise and brain stimulation exercises (crossword puzzles, word search, etc), control of BP, cholesterol, are important for brain health 5. Follow-up in 1 year, call for any changes

## 2014-08-20 NOTE — Progress Notes (Signed)
NEUROLOGY CONSULTATION NOTE  Ausha Sieh MRN: [id number] DOB: 10-07-1932  Referring provider: Dr. [name] Primary care provider: Dr. [name]  Reason for consult:  Memory loss  Dear Dr Moreen Fowler:  Thank you for your kind referral of Mildreth Reek for consultation of the above symptoms. Although her history is well known to you, please allow me to reiterate it for the purpose of our medical record. Records and images were personally reviewed where available.  HISTORY OF PRESENT ILLNESS: This is a pleasant 81 year old right-handed woman with a history of atrial fibrillation on anticoagulation with Eliquis, hypertension, hyperlipidemia, diabetes, anxiety, vitamin D deficiency, polymyalgia rheumatica, presenting for evaluation of worsening memory. She started noticing symptoms over the past few months, mostly with short-term memory, she would forget things, occasionally forget her medications, or get disoriented when driving in unfamiliar places. She has noticed some word-finding difficulties. There are times she would "feel lost" when in crowded places like the mall last week. She denies any missed bill payments, she cooks and drives without difficulties, no difficulties with ADLs.  She was found to have atrial fibrillation after episodes of syncope 2-3 years ago. She has had brief sharp pains in the occipital region lasting a few seconds occurring around once a week, with associated photophobia, no nausea/vomiting. She has occasional dizziness. She denies any vision changes except for blurred vision from cataracts and glaucoma. She has chronic neck and back pain. She has numbness and tingling in both hands, and occasional pain in her calves. She denies any bowel/bladder dysfunction, no anosmia or tremors. She reports her mother had "the beginning of Alzheimer's" at age 72. She had a head injury when younger, hit her head and was unconscious until she woke up in the hospital. She denies  any alcohol intake.  Laboratory Data: 03/2014: CMP normal, TSH 2.68, HbA1c 7.1, lipid panel showed total cholesterol 202, LDL 144.  PAST MEDICAL HISTORY: Past Medical History  Diagnosis Date  . Abnormal heart rhythm   . GERD (gastroesophageal reflux disease)   . Rheumatoid arthritis   . Hypertension   . Allergic rhinitis   . Anxiety   . Osteopenia   . Vertigo   . Headache   . Diabetes mellitus without complication     PAST SURGICAL HISTORY: Past Surgical History  Procedure Laterality Date  . Tubal ligation    . Cataract extraction      bilateral    MEDICATIONS: Current Outpatient Prescriptions on File Prior to Visit  Medication Sig Dispense Refill  . apixaban (ELIQUIS) 5 MG TABS tablet Take 5 mg by mouth 2 (two) times daily.    Marland Kitchen aspirin 81 MG tablet Take 81 mg by mouth daily.    . digoxin (LANOXIN) 0.125 MG tablet Take 125 mcg by mouth daily.    Marland Kitchen diltiazem (DILACOR XR) 240 MG 24 hr capsule Take 240 mg by mouth daily.    . ergocalciferol (VITAMIN D2) 50000 UNITS capsule Take 50,000 Units by mouth once a week.    . escitalopram (LEXAPRO) 10 MG tablet Take 10 mg by mouth daily.    . folic acid (FOLVITE) 1 MG tablet Take 1 mg by mouth daily.    . mometasone-formoterol (DULERA) 100-5 MCG/ACT AERO Take 2 puffs first thing in am and then another 2 puffs about 12 hours later. 1 Inhaler 11   No current facility-administered medications on file prior to visit.    ALLERGIES: Allergies  Allergen Reactions  . Motrin [Ibuprofen]  FAMILY HISTORY: Family History  Problem Relation Age of Onset  . Emphysema Father     smoked  . Heart disease Mother     SOCIAL HISTORY: History   Social History  . Marital Status: Married    Spouse Name: N/A  . Number of Children: 6  . Years of Education: N/A   Occupational History  . Retired    Social History Main Topics  . Smoking status: Never Smoker   . Smokeless tobacco: Never Used  . Alcohol Use: No  . Drug Use: No  .  Sexual Activity: Not on file   Other Topics Concern  . Not on file   Social History Narrative    REVIEW OF SYSTEMS: Constitutional: No fevers, chills, or sweats, no generalized fatigue, change in appetite Eyes: as above Ear, nose and throat: No hearing loss, ear pain, nasal congestion, sore throat Cardiovascular: No chest pain, palpitations Respiratory:  No shortness of breath at rest or with exertion, wheezes GastrointestinaI: No nausea, vomiting, diarrhea, abdominal pain, fecal incontinence Genitourinary:  No dysuria, urinary retention or frequency Musculoskeletal:  + neck pain, back pain Integumentary: No rash, pruritus, skin lesions Neurological: as above Psychiatric: No depression, insomnia, anxiety Endocrine: No palpitations, fatigue, diaphoresis, mood swings, change in appetite, change in weight, increased thirst Hematologic/Lymphatic:  No anemia, purpura, petechiae. Allergic/Immunologic: no itchy/runny eyes, nasal congestion, recent allergic reactions, rashes  PHYSICAL EXAM: Filed Vitals:   08/20/14 1340  BP: 110/50  Pulse: 67  Resp: 16   General: No acute distress Head:  Normocephalic/atraumatic Eyes: Fundoscopic exam shows bilateral sharp discs, no vessel changes, exudates, or hemorrhages Neck: supple, no paraspinal tenderness, full range of motion Back: No paraspinal tenderness Heart: regular rate and rhythm Lungs: Clear to auscultation bilaterally. Vascular: No carotid bruits. Skin/Extremities: No rash, no edema Neurological Exam: Mental status: alert and oriented to person, place, and time, no dysarthria or aphasia, Fund of knowledge is appropriate.  Recent and remote memory are intact.  Attention and concentration are normal.    Able to name objects and repeat phrases. MMSE - Mini Mental State Exam 08/20/2014  Orientation to time 5  Orientation to Place 5  Registration 3  Attention/ Calculation 5  Recall 3  Language- name 2 objects 2  Language- repeat 1    Language- follow 3 step command 3  Language- read & follow direction 1  Write a sentence 1  Copy design 1  Total score 30   Cranial nerves: CN I: not tested CN II: pupils equal, round and reactive to light, visual fields intact, fundi unremarkable. CN III, IV, VI:  full range of motion, no nystagmus, no ptosis CN V: facial sensation intact CN VII: upper and lower face symmetric CN VIII: hearing intact to finger rub CN IX, X: gag intact, uvula midline CN XI: sternocleidomastoid and trapezius muscles intact CN XII: tongue midline Bulk & Tone: normal, no cogwheeling,no fasciculations. Motor: 5/5 throughout with no pronator drift. Sensation: intact to light touch, cold, pin, vibration and joint position sense.  No extinction to double simultaneous stimulation.  Romberg test negative Deep Tendon Reflexes: +2 throughout except for absent ankle jerks bilaterally, no ankle clonus Plantar responses: downgoing bilaterally Cerebellar: no incoordination on finger to nose, heel to shin. No dysdiadochokinesia Gait: narrow-based and steady, mild difficulty with tandem walk but able Tremor: none  IMPRESSION: This is a pleasant 81 year old right-handed woman with vascular risk factors including hypertension, hyperlipidemia, diabetes, atrial fibrillation on Eliquis, PMR, presenting for worsening memory  and stabbing headaches. Her MMSE today is normal 30/30, neurological exam normal. Symptoms may be due to age-related memory changes versus mild cognitive impairment. We discussed different causes of memory loss. Check TSH and B12. MRI brain without contrast will be ordered to assess for underlying structural abnormality and assess vascular load. We discussed that she may benefit from starting low dose cholinesterase inhibitors such as Aricept, side effects and expectations from the medication were discussed. She would like to wait for results of tests first before she decides. Headaches suggestive of  occipital neuralgia vs cervicogenic headaches. We discussed the importance of control of vascular risk factors, physical exercise, and brain stimulation exercises for brain health. She will follow-up in 1 year or earlier if needed.   Thank you for allowing me to participate in the care of this patient. Please do not hesitate to call for any questions or concerns.   Ellouise Newer, M.D.  CC: Dr. Moreen Fowler

## 2014-08-21 ENCOUNTER — Telehealth: Payer: Self-pay | Admitting: Family Medicine

## 2014-08-21 NOTE — Telephone Encounter (Signed)
-----   Message from Cameron Sprang, MD sent at 08/21/2014  8:28 AM EDT ----- Pls let her know bloodwork is normal, thanks

## 2014-08-21 NOTE — Telephone Encounter (Signed)
Lmovm to return my call. 

## 2014-08-23 NOTE — Telephone Encounter (Signed)
Called patient again and was able to speak with her. Notified her of results.

## 2014-08-28 ENCOUNTER — Telehealth: Payer: Self-pay

## 2014-08-28 NOTE — Telephone Encounter (Signed)
Spoke with patient and informed her that Dr Delice Lesch reviewed the MRI and it was unremarkable, no tumor, stroke or bleed.  Just age related changes.

## 2014-08-28 NOTE — Telephone Encounter (Signed)
-----   Message from Cameron Sprang, MD sent at 08/28/2014 12:33 PM EDT ----- Regarding: MRI Pls document on phone note and let patient know that I reviewed MRI brain and it is unremarkable, no evidence of tumor, stroke, or bleed. It shows age-related changes. Thanks

## 2014-09-11 ENCOUNTER — Encounter: Payer: Self-pay | Admitting: Neurology

## 2014-11-22 ENCOUNTER — Other Ambulatory Visit: Payer: Self-pay | Admitting: Family Medicine

## 2014-11-22 ENCOUNTER — Ambulatory Visit
Admission: RE | Admit: 2014-11-22 | Discharge: 2014-11-22 | Disposition: A | Discharge status: Home / Self Care | Payer: Medicare Other | Source: Ambulatory Visit | Attending: Family Medicine | Admitting: Family Medicine

## 2014-11-22 DIAGNOSIS — R059 Cough, unspecified: Secondary | ICD-10-CM

## 2014-11-22 DIAGNOSIS — R05 Cough: Secondary | ICD-10-CM

## 2015-01-24 ENCOUNTER — Encounter: Payer: Self-pay | Admitting: Acute Care

## 2015-01-24 ENCOUNTER — Ambulatory Visit (INDEPENDENT_AMBULATORY_CARE_PROVIDER_SITE_OTHER): Payer: Medicare Other | Admitting: Acute Care

## 2015-01-24 VITALS — BP 136/62 | HR 65 | Temp 97.7°F | Ht 63.0 in | Wt 137.6 lb

## 2015-01-24 DIAGNOSIS — J45991 Cough variant asthma: Secondary | ICD-10-CM

## 2015-01-24 NOTE — Patient Instructions (Addendum)
Start taking the pepcid 20 mg one at bedtime. Continue the protonix 40 mg Take 30-60 min before first meal of the day. Avoid mint products and menthol as this can make make your cough worse.  Try using sugar free Jolly Ranchers to sooth your throat. Add the Chlortrimeton 4 mg x 2 at bedtime , this is an antihistamine and is available over the counter. Delsym cough syrup over the counter for cough as needed. Try not to clear your throat, use sips of water instead. Follow up with Dr. Melvyn Novas in 3-4 months or as needed Please contact office for sooner follow up if symptoms do not improve or worsen or seek emergency care

## 2015-01-24 NOTE — Assessment & Plan Note (Addendum)
Continues to cough, but is not taking the pepcid or Chlortrimeton recommended by Dr. Melvyn Novas in original plan for cough. She has stopped taking her MTX within the past week and has noticed that her cough is somewhat better. Specifically does not want to take prednisone for her cough as it causes elevation of blood sugars, which she is monitoring.  Plan: Continue Dulera 102 puffs first thing in am and then another 2 puffs about 12 hours later.   Start taking the pepcid 20 mg one at bedtime. Continue the protonix 40 mg Take 30-60 min before first meal of the day. Avoid mint products and menthol as this can make make your cough worse.  Try using sugar free Jolly Ranchers to sooth your throat. Add the Chlortrimeton 4 mg x 2 at bedtime , this is an antihistamine and is available over the counter. Try Delsym cough syrup 2 teaspoons every 12 hours over the counter for cough as needed. Try not to clear your throat, use sips of water instead. Follow up with Dr. Melvyn Novas in 3-4 months or as needed

## 2015-01-24 NOTE — Progress Notes (Signed)
Subjective:    Patient ID: Lorraine Gilbert, female    DOB: Nov 04, 1932  MRN: [id number]   Brief patient profile:  58  yf from [place] never smoker with cough and sob x decades previously eval by Dr Bernita Buffy referred by Dr Moreen Fowler 07/24/2012 for further evaluation of sob and cough with perfectly nl pfts 12/04/2013    History of Present Illness  07/24/2012 1st pulmonary eval cc cough x 2 months indolent onset progressively worse that is not better on advair, min prod of white mucus more day than night, assoc with mild sob. rec Stop advair Start dulera 100 Take 2 puffs first thing in am and then another 2 puffs about 12 hours later.  Pantoprazole (protonix) 40 mg  Take 30-60 min before first meal of the day and Pepcid 20 mg one bedtime until return to office - this is the best way to tell whether stomach acid is contributing to your problem.   GERD   If not better return here in 2 weeks all bottles/inhalers from all doctors or return to [facility] and if you want to change to [facility] we will need you to bring your records with you as well >> did not return as rec    10/18/2013 extended ov/ re-establish care ov/Wert re: on ppi ac and dulera 100 but extremely poor hfa  Chief Complaint  Patient presents with  . Acute Visit    Pt c/o increased cough x 2 months- prod with minimal clear sputum. Breathing is unchanged since her last visit.   cough is worse p supper and at hs but not waking her once asleep or in am Not limited by breathing from desired activities   >steroid  Taper , cough trigger regimen w/ PPI and H2  blocker and H1 antihistamine .   11/01/2013 Follow up and Med review  Pt returns for 2 week follow up for cough .  She was seen last week for return of cough . She was given steroid taper and strarted on PPI and Pepcid  Chlortrimeton added to bedtime.  CXR last ov with no acute process .  She return feeling much better. Says cough is totally gone.  We reviewed all her  meds and organized them into a  Med calendar with pt education .  She did not start on pepcid as recommended from last ov.  Nor did she add chlortabs .  Denies chest pain,orthopnea, edema, fever or chest pain.  Of note she does have RA on MTX . This was not on her med list from last ov.  rec No change rx     12/04/2013 f/u ov/Wert re: f/u pfts off dulera > 12 hours with nl pfts  Chief Complaint  Patient presents with  . Follow-up    Pt states that her breathing is unchanged. Her cough has resolved. No new co's today.     [facility]ne/ [facility] following rec Continue the protonix 40 mg Take 30-60 min before first meal of the day  And  if you start coughing for any reason>>add pepcid ac 20 mg at bedtime Ok to leave off dulera to see what difference it makes   08/14/2014 f/u ov/Wert re: ? Cough variant asthma  Chief Complaint  Patient presents with  . Acute Visit    Pt c/o cough x 3 wks- prod at times with minimal clear sputum.   cough worse at hs not taking pepcid dulera empty x 2 days ? If really helping as hfa  very poor and did fine off all resp rx x month prior to this acute flare x 3 weeks    Not limited by breathing from desired activities  But rather by arthritis and fatigue  >>> Continue protonix         Add pepcid if cough returns         Avoid mints and menthol         Dulera 100 2 puffs/day and 2 puffs 12 hours later         Prednisone taper  01/24/2015 Follow up :  ? cough variant asthma :  Pt. Continues to cough , but is not taking the pepcid or the Chlortrimeton 4 mg x 2 at bedtime as Dr. Melvyn Novas had instructed in his plan for her at last visit . She does not want to take prednisone due to the fact it increases her blood sugar. She is afebrile, and cough is non-productive. She has recently( 1 week ago) stopped her Methotrexate and has noticed an improvement in her cough.CXR done 11/22/14 shows Stable cardiomegaly and aortic atherosclerosis. Lungs remain clear.Negative for edema or  pneumonia. No effusion or pneumothorax. Cardiomegaly without acute process Trachea midline. No acute osseous finding. We have reviewed the plan Dr. Melvyn Novas developed for her cough in detail. We added to the regimen the specific medications she has not been taking as directed ( pepcid 20 mg at bedtime and Chlortrimeton 4 mg x 2 at bedtime.)  I have reviewed all current medications, allergies,Past Medical History, Social and Family history as documented in the electronic medical record.   ROS  The following are not active complaints unless bolded sore throat, dysphagia, dental problems, itching, sneezing,  nasal congestion or excess/ purulent secretions, ear ache,   fever, chills, sweats, unintended wt loss, pleuritic or exertional cp, hemoptysis,  orthopnea pnd or leg swelling, presyncope, palpitations, heartburn, abdominal pain, anorexia, nausea, vomiting, diarrhea  or change in bowel or urinary habits, change in stools or urine, dysuria,hematuria,  rash, arthralgias, visual complaints, headache, numbness weakness or ataxia or problems with walking or coordination,  change in mood/affect or memory.            Objective:   Physical Exam   amb pr female nad   71/6/17       138 > 137 01/24/2015  Vital signs reviewed       HEENT: nl dentition, turbinates, and orophanx. Nl external ear canals without cough reflex   NECK :  without JVD/Nodes/TM/ nl carotid upstrokes bilaterally   LUNGS: no acc muscle use, clear to A and P bilaterally without cough on insp or exp maneuvers   CV:  RRR  no s3 or murmur or increase in P2, no edema   ABD:  soft and nontender with nl excursion in the supine position. No bruits or organomegaly, bowel sounds nl  MS:  warm without deformities, calf tenderness, cyanosis or clubbing        CXR:  03/25/14  1. Mild cardiomegaly, no pulmonary venous congestion. 2. No acute pulmonary disease. Chest is stable prior exam.       Assessment & Plan:

## 2015-01-25 NOTE — Progress Notes (Signed)
Chart and office note reviewed in detail  > agree with a/p as outlined    

## 2015-03-10 ENCOUNTER — Encounter: Payer: Self-pay | Admitting: Neurology

## 2015-03-26 ENCOUNTER — Other Ambulatory Visit: Payer: Self-pay

## 2015-03-26 DIAGNOSIS — Z1231 Encounter for screening mammogram for malignant neoplasm of breast: Secondary | ICD-10-CM

## 2015-04-07 ENCOUNTER — Ambulatory Visit
Admission: RE | Admit: 2015-04-07 | Discharge: 2015-04-07 | Disposition: A | Discharge status: Home / Self Care | Payer: Medicare Other | Source: Ambulatory Visit

## 2015-04-07 DIAGNOSIS — Z1231 Encounter for screening mammogram for malignant neoplasm of breast: Secondary | ICD-10-CM

## 2015-04-24 ENCOUNTER — Ambulatory Visit (INDEPENDENT_AMBULATORY_CARE_PROVIDER_SITE_OTHER)
Admission: RE | Admit: 2015-04-24 | Discharge: 2015-04-24 | Disposition: A | Discharge status: Home / Self Care | Payer: Medicare Other | Source: Ambulatory Visit | Attending: Internal Medicine | Admitting: Internal Medicine

## 2015-04-24 ENCOUNTER — Ambulatory Visit (INDEPENDENT_AMBULATORY_CARE_PROVIDER_SITE_OTHER): Payer: Medicare Other | Admitting: Internal Medicine

## 2015-04-24 ENCOUNTER — Encounter: Payer: Self-pay | Admitting: Internal Medicine

## 2015-04-24 VITALS — BP 140/58 | HR 68 | Ht 63.0 in | Wt 137.0 lb

## 2015-04-24 DIAGNOSIS — J45991 Cough variant asthma: Secondary | ICD-10-CM

## 2015-04-24 LAB — NITRIC OXIDE: Nitric Oxide: 17

## 2015-04-24 MED ORDER — MOMETASONE FURO-FORMOTEROL FUM 100-5 MCG/ACT IN AERO: 2.0000 | INHALATION_SPRAY | Freq: Two times a day (BID) | RESPIRATORY_TRACT | Status: DC

## 2015-04-24 NOTE — Patient Instructions (Signed)
For drainage / throat tickle try take CHLORPHENIRAMINE  4 mg - take one every 4 hours as needed - available over the counter- may cause drowsiness so start with just a bedtime dose or two and see how you tolerate it before trying in daytime    Change the protonix to Take 30- 60 min before your first and last meals of the day   GERD (REFLUX)  is an extremely common cause of respiratory symptoms just like yours , many times with no obvious heartburn at all.    It can be treated with medication, but also with lifestyle changes including elevation of the head of your bed (ideally with 6 inch  bed blocks),  Smoking cessation, avoidance of late meals, excessive alcohol, and avoid fatty foods, chocolate, peppermint, colas, red wine, and acidic juices such as orange juice.  NO MINT OR MENTHOL PRODUCTS SO NO COUGH DROPS  USE SUGARLESS CANDY INSTEAD (Jolley ranchers or Stover's or Life Savers) or even ice chips will also do - the key is to swallow to prevent all throat clearing. NO OIL BASED VITAMINS - use powdered substitutes.    If not better restart dulera 100 Take 2 puffs first thing in am and then another 2 puffs about 12 hours later.   Please remember to go to the  x-ray department downstairs for your tests - we will call you with the results when they are available.    See Tammy NP w/in 2 weeks (or next available)  with all your medications, even over the counter meds, separated in two separate bags, the ones you take no matter what vs the ones you stop once you feel better and take only as needed when you feel you need them.   Tammy  will generate for you a new user friendly medication calendar that will put [place] all on the same page re: your medication use.     Without this process, it simply isn't possible to assure that we are providing  your outpatient care  with  the attention to detail we feel you deserve.   If we cannot assure that you're getting that kind of care,  then we cannot manage your  problem effectively from this clinic.  Once you have seen Tammy and we are sure that we're all on the same page with your medication use she will arrange follow up with me.

## 2015-04-24 NOTE — Progress Notes (Signed)
Subjective:    Patient ID: Lorraine Gilbert, female    DOB: 1932-10-18  MRN: [id number]   Brief patient profile:  54  yf from [place] never smoker with cough and sob x decades previously eval by Dr Bernita Buffy referred by Dr Moreen Fowler 07/24/2012 for further evaluation of sob and cough with perfectly nl pfts 12/04/2013    History of Present Illness  07/24/2012 1st pulmonary eval cc cough x 2 months indolent onset progressively worse that is not better on advair, min prod of white mucus more day than night, assoc with mild sob. rec Stop advair Start dulera 100 Take 2 puffs first thing in am and then another 2 puffs about 12 hours later.  Pantoprazole (protonix) 40 mg  Take 30-60 min before first meal of the day and Pepcid 20 mg one bedtime until return to office - this is the best way to tell whether stomach acid is contributing to your problem.   GERD   If not better return here in 2 weeks all bottles/inhalers from all doctors or return to [place] and if you want to change to [facility] we will need you to bring your records with you as well >> did not return as rec    10/18/2013 extended ov/ re-establish care ov/Stran Raper re: on ppi ac and dulera 100 but extremely poor hfa  Chief Complaint  Patient presents with  . Acute Visit    Pt c/o increased cough x 2 months- prod with minimal clear sputum. Breathing is unchanged since her last visit.   cough is worse p supper and at hs but not waking her once asleep or in am Not limited by breathing from desired activities   >steroid  Taper , cough trigger regimen w/ PPI and H2  blocker and H1 antihistamine .   11/01/2013 Follow up and Med review  Pt returns for 2 week follow up for cough .  She was seen last week for return of cough . She was given steroid taper and strarted on PPI and Pepcid  Chlortrimeton added to bedtime.  CXR last ov with no acute process .  She return feeling much better. Says cough is totally gone.  We reviewed all her  meds and organized them into a  Med calendar with pt education .  She did not start on pepcid as recommended from last ov.  Nor did she add chlortabs .  Denies chest pain,orthopnea, edema, fever or chest pain.  Of note she does have RA on MTX . This was not on her med list from last ov.  rec No change rx     12/04/2013 f/u ov/Nazanin Kinner re: f/u pfts off dulera > 12 hours with nl pfts  Chief Complaint  Patient presents with  . Follow-up    Pt states that her breathing is unchanged. Her cough has resolved. No new co's today.     Swayne/ hawkes following rec Continue the protonix 40 mg Take 30-60 min before first meal of the day  And  if you start coughing for any reason>>add pepcid ac 20 mg at bedtime Ok to leave off dulera to see what difference it makes   08/14/2014 f/u ov/Jisella Ashenfelter re: ? Cough variant asthma  Chief Complaint  Patient presents with  . Acute Visit    Pt c/o cough x 3 wks- prod at times with minimal clear sputum.   cough worse at hs not taking pepcid dulera empty x 2 days ? If really helping as hfa  very poor and did fine off all resp rx x month prior to this acute flare x 3 weeks rec Continue the protonix 40 mg Take 30-60 min before first meal of the day  And  add pepcid ac 20 mg at bedtime GERD  Diet   Resume dulera 100 Take 2 puffs first thing in am and then another 2 puffs about 12 hours later.  Work on inhaler technique:  If not satisfied return with all mediations in hand     01/24/15 NP recs Start taking the pepcid 20 mg one at bedtime. Continue the protonix 40 mg Take 30-60 min before first meal of the day. Avoid mint products and menthol as this can make make your cough worse.  Try using sugar free Jolly Ranchers to sooth your throat. Add the Chlortrimeton 4 mg x 2 at bedtime , this is an antihistamine and is available over the counter. Delsym cough syrup over the counter for cough as needed. Try not to clear your throat, use sips of water instead.    04/24/2015   f/u ov/Bracken Moffa re: chronic cough with nl spirometry and NO / off dulera ? Since when and on ppi ?80 mg before bfast? pepcid at hs  Chief Complaint  Patient presents with  . Follow-up    Increased cough x 5 wks- esp worse at night and occ prod with min, clear sputum- "feels like it gets stuck in my throat".  She has occ wheezing and chest tightness.   did not get the chlorpheniramine yet / very easily confused with details of care/ meds/ names/ refills    Not limited by breathing from desired activities  But rather by arthritis and fatigue   No obvious day to day or daytime variabilty or assoc excess/ purulent sputum or mucus plugs   or cp or chest tightness, subjective wheeze overt sinus or hb symptoms. No unusual exp hx or h/o childhood pna/ asthma or knowledge of premature birth.  Also denies any obvious fluctuation of symptoms with weather or environmental changes or other aggravating or alleviating factors except as outlined above   Current Medications, Allergies, Complete Past Medical History, Past Surgical History, Family History, and Social History were reviewed in [employer] record.  ROS  The following are not active complaints unless bolded sore throat, dysphagia, dental problems, itching, sneezing,  nasal congestion or excess/ purulent secretions, ear ache,   fever, chills, sweats, unintended wt loss, pleuritic or exertional cp, hemoptysis,  orthopnea pnd or leg swelling, presyncope, palpitations, heartburn, abdominal pain, anorexia, nausea, vomiting, diarrhea  or change in bowel or urinary habits, change in stools or urine, dysuria,hematuria,  rash, arthralgias, visual complaints, headache, numbness weakness or ataxia or problems with walking or coordination,  change in mood/affect or memory.            Objective:   Physical Exam   amb pr female nad prominent pseudowheeze resolves with plm   08/14/2014        138 >  04/24/2015   137     12/04/13 132 lb  (59.875 kg)  11/01/13 135 lb 12.8 oz (61.598 kg)  10/18/13 133 lb 3.2 oz (60.419 kg)    Vital signs reviewed       HEENT: nl dentition, turbinates, and orophanx. Nl external ear canals without cough reflex   NECK :  without JVD/Nodes/TM/ nl carotid upstrokes bilaterally   LUNGS: no acc muscle use, clear to A and P bilaterally without cough on insp or  exp maneuvers   CV:  RRR  no s3 or murmur or increase in P2, no edema   ABD:  soft and nontender with nl excursion in the supine position. No bruits or organomegaly, bowel sounds nl  MS:  warm without deformities, calf tenderness, cyanosis or clubbing             Assessment & Plan:

## 2015-04-25 NOTE — Assessment & Plan Note (Signed)
-   10/18/2013  resume trial of dulera 100 2bid  -Med calendar 11/01/2013 > did not bring as instructed 12/04/13 - PFTs wnl 12/04/13 > ok to try off dulera - flare off dulera 08/14/14 > ok to resume dulera 100 2bid   - Spirometry 04/24/2015  wnl off dulera / even fef 25-75 - NO 04/24/2015  = 17  - 04/24/2015  p extensive coaching HFA effectiveness =    75%  From a baseline of 25%  Exam/ data strongly support vcd over asthma so first rec she try the 1st gen h1 and if not effective add back duler 100 2bid  I had an extended discussion with the patient reviewing all relevant studies completed to date and  lasting 15 to 20 minutes of a 25 minute visit    Each maintenance medication was reviewed in detail including most importantly the difference between maintenance and prns and under what circumstances the prns are to be triggered using an action plan format that is not reflected in the computer generated alphabetically organized AVS.    Please see instructions for details which were reviewed in writing and the patient given a copy highlighting the part that I personally wrote and discussed at today's ov.

## 2015-04-25 NOTE — Progress Notes (Signed)
Quick Note:  LMOM with results ______ 

## 2015-04-29 ENCOUNTER — Other Ambulatory Visit: Payer: Self-pay | Admitting: Gastroenterology

## 2015-04-29 DIAGNOSIS — R109 Unspecified abdominal pain: Secondary | ICD-10-CM

## 2015-05-05 ENCOUNTER — Ambulatory Visit
Admission: RE | Admit: 2015-05-05 | Discharge: 2015-05-05 | Disposition: A | Discharge status: Home / Self Care | Payer: Medicare Other | Source: Ambulatory Visit | Attending: Gastroenterology | Admitting: Gastroenterology

## 2015-05-05 DIAGNOSIS — R109 Unspecified abdominal pain: Secondary | ICD-10-CM

## 2015-05-08 ENCOUNTER — Encounter: Payer: Self-pay | Admitting: Adult Health

## 2015-05-08 ENCOUNTER — Ambulatory Visit (INDEPENDENT_AMBULATORY_CARE_PROVIDER_SITE_OTHER): Payer: Medicare Other | Admitting: Adult Health

## 2015-05-08 VITALS — BP 122/60 | HR 57 | Temp 97.6°F | Ht 63.0 in | Wt 135.0 lb

## 2015-05-08 DIAGNOSIS — J45991 Cough variant asthma: Secondary | ICD-10-CM | POA: Diagnosis not present

## 2015-05-08 NOTE — Patient Instructions (Signed)
Follow med calendar closely and bring to each visit.  Follow up Dr. Wert  In 3 months and As needed   Please contact office for sooner follow up if symptoms do not improve or worsen or seek emergency care   

## 2015-05-09 ENCOUNTER — Ambulatory Visit
Admission: RE | Admit: 2015-05-09 | Discharge: 2015-05-09 | Disposition: A | Discharge status: Home / Self Care | Payer: Medicare Other | Source: Ambulatory Visit | Attending: Gastroenterology | Admitting: Gastroenterology

## 2015-05-09 DIAGNOSIS — R109 Unspecified abdominal pain: Secondary | ICD-10-CM

## 2015-05-13 NOTE — Assessment & Plan Note (Signed)
Improved on [facility]   Plan  Follow med calendar closely and bring to each visit.   Follow up Dr. Melvyn Novas  In 3 months  and As needed   Please contact office for sooner follow up if symptoms do not improve or worsen or seek emergency care

## 2015-05-13 NOTE — Progress Notes (Signed)
Subjective:    Patient ID: Lorraine Gilbert, female    DOB: [birth year]  MRN: [id number] Brief patient profile:  84   yf from [place] never smoker with cough and sob x decades previously eval by Dr Bernita Buffy referred by Dr Moreen Fowler 07/24/2012 for further evaluation of sob and cough with perfectly nl pfts 12/04/2013    History of Present Illness  07/24/2012 1st pulmonary eval cc cough x 2 months indolent onset progressively worse that is not better on advair, min prod of white mucus more day than night, assoc with mild sob. rec Stop advair Start dulera 100 Take 2 puffs first thing in am and then another 2 puffs about 12 hours later.  Pantoprazole (protonix) 40 mg  Take 30-60 min before first meal of the day and Pepcid 20 mg one bedtime until return to office - this is the best way to tell whether stomach acid is contributing to your problem.   GERD   If not better return here in 2 weeks all bottles/inhalers from all doctors or return to [facility] and if you want to change to [facility] we will need you to bring your records with you as well >> did not return as rec    10/18/2013 extended ov/ re-establish care ov/Wert re: on ppi ac and dulera 100 but extremely poor hfa  Chief Complaint  Patient presents with  . Acute Visit    Pt c/o increased cough x 2 months- prod with minimal clear sputum. Breathing is unchanged since her last visit.   cough is worse p supper and at hs but not waking her once asleep or in am Not limited by breathing from desired activities   >steroid  Taper , cough trigger regimen w/ PPI and H2  blocker and H1 antihistamine .   11/01/2013 Follow up and Med review  Pt returns for 2 week follow up for cough .  She was seen last week for return of cough . She was given steroid taper and strarted on PPI and Pepcid  Chlortrimeton added to bedtime.  CXR last ov with no acute process .  She return feeling much better. Says cough is totally gone.  We reviewed all her  meds and organized them into a  Med calendar with pt education .  She did not start on pepcid as recommended from last ov.  Nor did she add chlortabs .  Denies chest pain,orthopnea, edema, fever or chest pain.  Of note she does have RA on MTX . This was not on her med list from last ov.  rec No change rx     12/04/2013 f/u ov/Wert re: f/u pfts off dulera > 12 hours with nl pfts  Chief Complaint  Patient presents with  . Follow-up    Pt states that her breathing is unchanged. Her cough has resolved. No new co's today.     Swayne/ hawkes following rec Continue the protonix 40 mg Take 30-60 min before first meal of the day  And  if you start coughing for any reason>>add pepcid ac 20 mg at bedtime Ok to leave off dulera to see what difference it makes   08/14/2014 f/u ov/Wert re: ? Cough variant asthma  Chief Complaint  Patient presents with  . Acute Visit    Pt c/o cough x 3 wks- prod at times with minimal clear sputum.   cough worse at hs not taking pepcid dulera empty x 2 days ? If really helping as hfa very  poor and did fine off all resp rx x month prior to this acute flare x 3 weeks rec Continue the protonix 40 mg Take 30-60 min before first meal of the day  And  add pepcid ac 20 mg at bedtime GERD  Diet   Resume dulera 100 Take 2 puffs first thing in am and then another 2 puffs about 12 hours later.  Work on inhaler technique:  If not satisfied return with all mediations in hand     01/24/15 NP recs Start taking the pepcid 20 mg one at bedtime. Continue the protonix 40 mg Take 30-60 min before first meal of the day. Avoid mint products and menthol as this can make make your cough worse.  Try using sugar free Jolly Ranchers to sooth your throat. Add the Chlortrimeton 4 mg x 2 at bedtime , this is an antihistamine and is available over the counter. Delsym cough syrup over the counter for cough as needed. Try not to clear your throat, use sips of water instead.    04/24/2015   f/u ov/Wert re: chronic cough with nl spirometry and NO / off dulera ? Since when and on ppi ?80 mg before bfast? pepcid at hs  Chief Complaint  Patient presents with  . Follow-up    Increased cough x 5 wks- esp worse at night and occ prod with min, clear sputum- "feels like it gets stuck in my throat".  She has occ wheezing and chest tightness.   did not get the chlorpheniramine yet / very easily confused with details of care/ meds/ names/ refills   Not limited by breathing from desired activities  But rather by arthritis and fatigue  >>Restart Dulera if not better.   05/08/15 Follow up : Chronic cough and Med calendar  Pt returns for 2 week follow up .  We reviewed all her meds and organized them into a med calendar with pt education.  Feels better but still has some  sinus drainage, SOB and wheezing at times.  PPI was increased to Twice daily  And Ruthe Mannan was restarted.  Denies any chest congestion/tightness, sinus pressure, fever, nausea or vomiting.   Current Medications, Allergies, Complete Past Medical History, Past Surgical History, Family History, and Social History were reviewed in [employer] record.  ROS  The following are not active complaints unless bolded sore throat, dysphagia, dental problems, itching, sneezing,  nasal congestion or excess/ purulent secretions, ear ache,   fever, chills, sweats, unintended wt loss, pleuritic or exertional cp, hemoptysis,  orthopnea pnd or leg swelling, presyncope, palpitations, heartburn, abdominal pain, anorexia, nausea, vomiting, diarrhea  or change in bowel or urinary habits, change in stools or urine, dysuria,hematuria,  rash, arthralgias, visual complaints, headache, numbness weakness or ataxia or problems with walking or coordination,  change in mood/affect or memory.            Objective:   Physical Exam   amb pr female nad   08/14/2014        138 >  04/24/2015   137  Filed Vitals:   05/08/15 1415  BP:  122/60  Pulse: 57  Temp: 97.6 F (36.4 C)  TempSrc: Oral  Height: 5\' 3"  (1.6 m)  Weight: 135 lb (61.236 kg)  SpO2: 99%      Vital signs reviewed       HEENT: nl dentition, turbinates, and orophanx. Nl external ear canals without cough reflex   NECK :  without JVD/Nodes/TM/ nl carotid upstrokes bilaterally  LUNGS: no acc muscle use, clear to A and P bilaterally without cough on insp or exp maneuvers   CV:  RRR  no s3 or murmur or increase in P2, no edema   ABD:  soft and nontender with nl excursion in the supine position. No bruits or organomegaly, bowel sounds nl  MS:  warm without deformities, calf tenderness, cyanosis or clubbing       Tammy Parrett NP-C  [facility]  05/08/15       Assessment & Plan:

## 2015-06-19 ENCOUNTER — Encounter: Payer: Self-pay | Admitting: Family Medicine

## 2015-08-08 ENCOUNTER — Encounter: Payer: Self-pay | Admitting: Internal Medicine

## 2015-08-08 ENCOUNTER — Ambulatory Visit (INDEPENDENT_AMBULATORY_CARE_PROVIDER_SITE_OTHER): Payer: Medicare Other | Admitting: Internal Medicine

## 2015-08-08 DIAGNOSIS — J45991 Cough variant asthma: Secondary | ICD-10-CM | POA: Diagnosis not present

## 2015-08-08 NOTE — Progress Notes (Signed)
Subjective:    Patient ID: Lorraine Gilbert, female    DOB: [birth year]  MRN: [id number] Brief patient profile:  84   yf from [place] never smoker with cough and sob x decades previously eval by Dr Bernita Buffy referred by Dr Moreen Fowler 07/24/2012 for further evaluation of sob and cough with perfectly nl pfts 12/04/2013    History of Present Illness  07/24/2012 1st pulmonary eval cc cough x 2 months indolent onset progressively worse that is not better on advair, min prod of white mucus more day than night, assoc with mild sob. rec Stop advair Start dulera 100 Take 2 puffs first thing in am and then another 2 puffs about 12 hours later.  Pantoprazole (protonix) 40 mg  Take 30-60 min before first meal of the day and Pepcid 20 mg one bedtime until return to office - this is the best way to tell whether stomach acid is contributing to your problem.   GERD   If not better return here in 2 weeks all bottles/inhalers from all doctors or return to [facility] and if you want to change to [facility] we will need you to bring your records with you as well >> did not return as rec    10/18/2013 extended ov/ re-establish care ov/Pranav Lince re: on ppi ac and dulera 100 but extremely poor hfa  Chief Complaint  Patient presents with  . Acute Visit    Pt c/o increased cough x 2 months- prod with minimal clear sputum. Breathing is unchanged since her last visit.   cough is worse p supper and at hs but not waking her once asleep or in am Not limited by breathing from desired activities   >steroid  Taper , cough trigger regimen w/ PPI and H2  blocker and H1 antihistamine .   11/01/2013 Follow up and Med review  Pt returns for 2 week follow up for cough .  She was seen last week for return of cough . She was given steroid taper and strarted on PPI and Pepcid  Chlortrimeton added to bedtime.  CXR last ov with no acute process .  She return feeling much better. Says cough is totally gone.  We reviewed all her  meds and organized them into a  Med calendar with pt education .  She did not start on pepcid as recommended from last ov.  Nor did she add chlortabs .  Denies chest pain,orthopnea, edema, fever or chest pain.  Of note she does have RA on MTX . This was not on her med list from last ov.  rec No change rx     12/04/2013 f/u ov/Jaxson Keener re: f/u pfts off dulera > 12 hours with nl pfts  Chief Complaint  Patient presents with  . Follow-up    Pt states that her breathing is unchanged. Her cough has resolved. No new co's today.     Swayne/ hawkes following rec Continue the protonix 40 mg Take 30-60 min before first meal of the day  And  if you start coughing for any reason>>add pepcid ac 20 mg at bedtime Ok to leave off dulera to see what difference it makes   08/14/2014 f/u ov/Maleak Brazzel re: ? Cough variant asthma  Chief Complaint  Patient presents with  . Acute Visit    Pt c/o cough x 3 wks- prod at times with minimal clear sputum.   cough worse at hs not taking pepcid dulera empty x 2 days ? If really helping as hfa very  poor and did fine off all resp rx x month prior to this acute flare x 3 weeks rec Continue the protonix 40 mg Take 30-60 min before first meal of the day  And  add pepcid ac 20 mg at bedtime GERD  Diet   Resume dulera 100 Take 2 puffs first thing in am and then another 2 puffs about 12 hours later.  Work on inhaler technique:  If not satisfied return with all mediations in hand     01/24/15 NP recs Start taking the pepcid 20 mg one at bedtime. Continue the protonix 40 mg Take 30-60 min before first meal of the day. Avoid mint products and menthol as this can make make your cough worse.  Try using sugar free Jolly Ranchers to sooth your throat. Add the Chlortrimeton 4 mg x 2 at bedtime , this is an antihistamine and is available over the counter. Delsym cough syrup over the counter for cough as needed. Try not to clear your throat, use sips of water instead.    04/24/2015   f/u ov/Eleanora Guinyard re: chronic cough with nl spirometry and NO / off dulera ? Since when and on ppi ?80 mg before bfast? pepcid at hs  Chief Complaint  Patient presents with  . Follow-up    Increased cough x 5 wks- esp worse at night and occ prod with min, clear sputum- "feels like it gets stuck in my throat".  She has occ wheezing and chest tightness.   did not get the chlorpheniramine yet / very easily confused with details of care/ meds/ names/ refills   Not limited by breathing from desired activities  But rather by arthritis and fatigue  For drainage / throat tickle try take CHLORPHENIRAMINE  4 mg - take one every 4 hours as needed -   Change the protonix to Take 30- 60 min before your first and last meals of the day  GERD better. If not better restart dulera 100 Take 2 puffs first thing in am and then another 2 puffs about 12 hours later.        05/08/15 Follow up : Chronic cough and Med calendar  Pt returns for 2 week follow up .  We reviewed all her meds and organized them into a med calendar with pt education.  Feels better but still has some  sinus drainage, SOB and wheezing at times.  PPI was increased to Twice daily  And Ruthe Mannan was restarted.  rec No change rx/ stay on med calendar    . 08/08/2015  f/u ov/Donnabelle Blanchard re: chronic cough/? Cough variant asthma/ not using dulera regularly / now also on MTX/ no med calendar Chief Complaint  Patient presents with  . Follow-up    Pt states that her cough is much improved. She has had some increased SOB which she relates to humid weather. No new co's today.    hfa about 50% effective/ Not limited by breathing from desired activities    No obvious day to day or daytime variability or assoc chronic cough or cp or chest tightness, subjective wheeze or overt sinus or hb symptoms. No unusual exp hx or h/o childhood pna/ asthma or knowledge of premature birth.  Sleeping ok without nocturnal  or early am exacerbation  of respiratory  c/o's or need for  noct saba. Also denies any obvious fluctuation of symptoms with weather or environmental changes or other aggravating or alleviating factors except as outlined above   Current Medications, Allergies, Complete Past Medical  History, Past Surgical History, Family History, and Social History were reviewed in [employer] record.  ROS  The following are not active complaints unless bolded sore throat, dysphagia, dental problems, itching, sneezing,  nasal congestion or excess/ purulent secretions, ear ache,   fever, chills, sweats, unintended wt loss, classically pleuritic or exertional cp, hemoptysis,  orthopnea pnd or leg swelling, presyncope, palpitations, abdominal pain, anorexia, nausea, vomiting, diarrhea  or change in bowel or bladder habits, change in stools or urine, dysuria,hematuria,  rash, arthralgias moderate , visual complaints, headache, numbness, weakness or ataxia or problems with walking or coordination,  change in mood/affect or memory.                 Objective:   Physical Exam   amb pr female nad   08/14/2014        138 >  04/24/2015   137> 08/08/2015  136  Vital signs reviewed      HEENT: nl dentition, turbinates, and orophanx. Nl external ear canals without cough reflex   NECK :  without JVD/Nodes/TM/ nl carotid upstrokes bilaterally   LUNGS: no acc muscle use, clear to A and P bilaterally without cough on insp or exp maneuvers   CV:  RRR  no s3 or murmur or increase in P2, no edema   ABD:  soft and nontender with nl excursion in the supine position. No bruits or organomegaly, bowel sounds nl  MS:  warm without deformities, calf tenderness, cyanosis or clubbing              Assessment & Plan:

## 2015-08-08 NOTE — Patient Instructions (Addendum)
Work on inhaler technique:  relax and gently blow all the way out then take a nice smooth deep breath back in, triggering the inhaler at same time you start breathing in.  Hold for up to 5 seconds if you can. Blow out thru nose. Rinse and gargle with water when done  [facility] to change dulera 100 to where only as needed for cough or short of breath > 2 puffs first thing in am and then another 2 puffs about 12 hours later if needed       Please schedule a follow up visit in 6 months but call sooner if needed

## 2015-08-10 NOTE — Assessment & Plan Note (Signed)
-  10/18/2013  resume trial of dulera 100 2bid  -Med calendar 11/01/2013 > did not bring as instructed 12/04/13 - PFTs wnl 12/04/13 > ok to try off dulera - flare off dulera 08/14/14 > ok to resume dulera 100 2bid  - Spirometry 04/24/2015  wnl off dulera / even fef 25-75 - NO 04/24/2015  = 17   - 08/08/2015  p extensive coaching HFA effectiveness =    75%  From a baseline of 25%   Despite continued poor hfa baseline, she is teachable and All goals of chronic asthma control met including optimal function and elimination of symptoms with minimal need for rescue therapy.  Contingencies discussed in full including contacting this office immediately if not controlling the symptoms using the rule of two's.     I had an extended discussion with the patient reviewing all relevant studies completed to date and  lasting 15 to 20 minutes of a 25 minute visit    Each maintenance medication was reviewed in detail including most importantly the difference between maintenance and prns and under what circumstances the prns are to be triggered using an action plan format that is not reflected in the computer generated alphabetically organized AVS.    Please see instructions for details which were reviewed in writing and the patient given a copy highlighting the part that I personally wrote and discussed at today's ov.

## 2015-08-20 ENCOUNTER — Ambulatory Visit: Payer: Medicare Other | Admitting: Neurology

## 2015-08-25 ENCOUNTER — Ambulatory Visit
Admission: RE | Admit: 2015-08-25 | Discharge: 2015-08-25 | Disposition: A | Discharge status: Home / Self Care | Payer: Medicare Other | Source: Ambulatory Visit | Attending: Family Medicine | Admitting: Family Medicine

## 2015-08-25 ENCOUNTER — Other Ambulatory Visit: Payer: Self-pay | Admitting: Family Medicine

## 2015-08-25 DIAGNOSIS — R220 Localized swelling, mass and lump, head: Secondary | ICD-10-CM

## 2015-08-25 MED ORDER — IOPAMIDOL (ISOVUE-300) INJECTION 61%
75.0000 mL | Freq: Once | INTRAVENOUS | Status: AC | PRN
  Administered 2015-08-25: 75 mL via INTRAVENOUS

## 2015-11-18 ENCOUNTER — Ambulatory Visit: Payer: Medicare Other | Admitting: Neurology

## 2015-11-18 ENCOUNTER — Encounter: Payer: Self-pay | Admitting: Neurology

## 2015-11-18 ENCOUNTER — Ambulatory Visit (INDEPENDENT_AMBULATORY_CARE_PROVIDER_SITE_OTHER): Payer: Medicare Other | Admitting: Neurology

## 2015-11-18 VITALS — BP 112/60 | HR 78 | Ht 63.0 in | Wt 135.0 lb

## 2015-11-18 DIAGNOSIS — R413 Other amnesia: Secondary | ICD-10-CM

## 2015-11-18 NOTE — Patient Instructions (Signed)
You look great! Continue control of blood pressure, cholesterol, diabetes, as well as physical exercise and brain stimulation exercises for brain health. Follow-up in 1 year.

## 2015-11-18 NOTE — Progress Notes (Signed)
NEUROLOGY FOLLOW UP OFFICE NOTE  Dioselyn Kretsch [id number]  HISTORY OF PRESENT ILLNESS: I had the pleasure of seeing Lorraine Gilbert in follow-up in the neurology clinic on 11/18/2015.  The patient was last seen more than a year ago for worsening memory. MMSE in August 2016 was 30/30. Records and images were personally reviewed where available.  I personally reviewed MRI brain without contrast which did not show any acute changes. There was mild diffuse atrophy and mild chronic microvascular disease. TSH and B12 normal. She has noticed she sometimes gets confused when she goes into a store and there are too many people, she does not recall if she has been there. She infrequently gets lost driving, but can quickly find her way back. She occasionally misplaces things. She occasionally forgets her medication, especially when she goes out of the house. Her husband has told her she repeats herself. She does the bills with her husband. She has occasional headaches and dizziness that she attributes to her allergies. She has tripped but denies any injuries. No focal numbness/tingling/weakness.   HPI 08/20/14: This is a pleasant 82 yo RH woman with a history of atrial fibrillation on anticoagulation with Eliquis, hypertension, hyperlipidemia, diabetes, anxiety, vitamin D deficiency, polymyalgia rheumatica, who presented for evaluation of worsening memory. She started noticing symptoms over the past few months, mostly with short-term memory, she would forget things, occasionally forget her medications, or get disoriented when driving in unfamiliar places. She has noticed some word-finding difficulties. There are times she would "feel lost" when in crowded places like the mall last week. She denies any missed bill payments, she cooks and drives without difficulties, no difficulties with ADLs.  She was found to have atrial fibrillation after episodes of syncope 2-3 years ago. She has had brief sharp pains in the  occipital region lasting a few seconds occurring around once a week, with associated photophobia, no nausea/vomiting. She has occasional dizziness. She denies any vision changes except for blurred vision from cataracts and glaucoma. She has chronic neck and back pain. She has numbness and tingling in both hands, and occasional pain in her calves. She denies any bowel/bladder dysfunction, no anosmia or tremors. She reports her mother had "the beginning of Alzheimer's" at age 80. She had a head injury when younger, hit her head and was unconscious until she woke up in the hospital. She denies any alcohol intake.  PAST MEDICAL HISTORY: Past Medical History:  Diagnosis Date  . Abnormal heart rhythm   . Allergic rhinitis   . Anxiety   . Diabetes mellitus without complication ([place])   . GERD (gastroesophageal reflux disease)   . Headache   . Hypertension   . Osteopenia   . Rheumatoid arthritis ([place])   . Vertigo     MEDICATIONS: Current Outpatient Prescriptions on File Prior to Visit  Medication Sig Dispense Refill  . apixaban (ELIQUIS) 5 MG TABS tablet Take 5 mg by mouth 2 (two) times daily.    Marland Kitchen aspirin 81 MG tablet Take 81 mg by mouth every morning.     . calcium carbonate (OS-CAL) 600 MG tablet Take 600 mg by mouth every morning.     . chlorpheniramine (CHLOR-TRIMETON) 4 MG tablet Take 4 mg by mouth every 4 (four) hours as needed (drippy nose, drainage, throat clearing).    Marland Kitchen dextromethorphan (DELSYM) 30 MG/5ML liquid Take 2 tsp every 12 hours as needed for cough    . digoxin (LANOXIN) 0.125 MG tablet Take 125 mcg by mouth every  morning.     . diltiazem (CARDIZEM CD) 240 MG 24 hr capsule Take 240 mg by mouth every morning.     . ergocalciferol (VITAMIN D2) 50000 UNITS capsule Take 50,000 Units by mouth once a week.    . escitalopram (LEXAPRO) 10 MG tablet Take 10 mg by mouth every morning.     . folic acid (FOLVITE) 1 MG tablet Take 1 mg by mouth every morning.     . methotrexate  (RHEUMATREX) 2.5 MG tablet 6 tablets once per wk per Dr. Trudie Reed    . mometasone-formoterol (DULERA) 100-5 MCG/ACT AERO Take 2 puffs first thing in am and then another 2 puffs about 12 hours later. 1 Inhaler 11  . pantoprazole (PROTONIX) 40 MG tablet Take 40 mg by mouth daily before breakfast.      No current facility-administered medications on file prior to visit.     ALLERGIES: Allergies  Allergen Reactions  . Motrin [Ibuprofen]   . Morphine Rash    Patient says it makes her feel like she is flying.     FAMILY HISTORY: Family History  Problem Relation Age of Onset  . Emphysema Father     smoked  . Heart disease Mother     SOCIAL HISTORY: Social History   Social History  . Marital status: Married    Spouse name: N/A  . Number of children: 6  . Years of education: N/A   Occupational History  . Retired    Social History Main Topics  . Smoking status: Never Smoker  . Smokeless tobacco: Never Used  . Alcohol use No  . Drug use: No  . Sexual activity: Not on file   Other Topics Concern  . Not on file   Social History Narrative  . No narrative on file    REVIEW OF SYSTEMS: Constitutional: No fevers, chills, or sweats, no generalized fatigue, change in appetite Eyes: No visual changes, double vision, eye pain Ear, nose and throat: No hearing loss, ear pain, nasal congestion, sore throat Cardiovascular: No chest pain, palpitations Respiratory:  No shortness of breath at rest or with exertion, wheezes GastrointestinaI: No nausea, vomiting, diarrhea, abdominal pain, fecal incontinence Genitourinary:  No dysuria, urinary retention or frequency Musculoskeletal:  No neck pain, back pain Integumentary: No rash, pruritus, skin lesions Neurological: as above Psychiatric: No depression, insomnia, anxiety Endocrine: No palpitations, fatigue, diaphoresis, mood swings, change in appetite, change in weight, increased thirst Hematologic/Lymphatic:  No anemia, purpura,  petechiae. Allergic/Immunologic: no itchy/runny eyes, nasal congestion, recent allergic reactions, rashes  PHYSICAL EXAM: Vitals:   11/18/15 1443  BP: 112/60  Pulse: 78   General: No acute distress Head:  Normocephalic/atraumatic Neck: supple, no paraspinal tenderness, full range of motion Heart:  Regular rate and rhythm Lungs:  Clear to auscultation bilaterally Back: No paraspinal tenderness Skin/Extremities: No rash, no edema Neurological Exam: alert and oriented to person, place, and time. No aphasia or dysarthria. Fund of knowledge is appropriate.  Recent and remote memory are intact.  Attention and concentration are normal.    Able to name objects and repeat phrases. CDT 5/5.  MMSE - Mini Mental State Exam 11/18/2015 08/20/2014  Orientation to time 5 5  Orientation to Place 5 5  Registration 3 3  Attention/ Calculation 5 5  Recall 2 3  Language- name 2 objects 2 2  Language- repeat 1 1  Language- follow 3 step command 3 3  Language- read & follow direction 1 1  Write a sentence 1 1  Copy design 1 1  Total score 29 30   Cranial nerves: Pupils equal, round, reactive to light. Extraocular movements intact with no nystagmus. Visual fields full. Facial sensation intact. No facial asymmetry. Tongue, uvula, palate midline.  Motor: Bulk and tone normal, muscle strength 5/5 throughout with no pronator drift.  Sensation to light touch intact.  No extinction to double simultaneous stimulation.  Deep tendon reflexes 2+ throughout except for absent ankle jerks bilaterally, toes downgoing.  Finger to nose testing intact.  Gait narrow-based and steady, able to tandem walk adequately.  Romberg negative.  IMPRESSION: This is a pleasant 82 yo RH woman with vascular risk factors including hypertension, hyperlipidemia, diabetes, atrial fibrillation on Eliquis, PMR, who presented with worsening memory and stabbing headaches. Her MMSE today is normal 29/30 (30/30 in August 2016). Neurological exam  normal. MRI brain unremarkable, TSH and B12 normal. We discussed the option of starting low dose cholinesterase inhibitors such as Aricept, she would like to hold off for now. Headaches had been suggestive of occipital neuralgia vs cervicogenic headaches, she takes prn pain medication with good effect.. We discussed the importance of control of vascular risk factors, physical exercise, and brain stimulation exercises for brain health. She will follow-up in 1 year or earlier if needed  Thank you for allowing me to participate in her care.  Please do not hesitate to call for any questions or concerns.  The duration of this appointment visit was 25 minutes of face-to-face time with the patient.  Greater than 50% of this time was spent in counseling, explanation of diagnosis, planning of further management, and coordination of care.   Ellouise Newer, M.D.   CC: Dr. Moreen Fowler

## 2015-12-17 ENCOUNTER — Other Ambulatory Visit: Payer: Self-pay | Admitting: Physician Assistant

## 2015-12-17 ENCOUNTER — Ambulatory Visit
Admission: RE | Admit: 2015-12-17 | Discharge: 2015-12-17 | Disposition: A | Discharge status: Home / Self Care | Payer: Medicare Other | Source: Ambulatory Visit | Attending: Physician Assistant | Admitting: Physician Assistant

## 2015-12-17 DIAGNOSIS — R059 Cough, unspecified: Secondary | ICD-10-CM

## 2015-12-17 DIAGNOSIS — R05 Cough: Secondary | ICD-10-CM

## 2016-02-09 ENCOUNTER — Encounter: Payer: Self-pay | Admitting: Internal Medicine

## 2016-02-09 ENCOUNTER — Ambulatory Visit (INDEPENDENT_AMBULATORY_CARE_PROVIDER_SITE_OTHER): Payer: Medicare Other | Admitting: Internal Medicine

## 2016-02-09 VITALS — BP 124/64 | HR 61 | Ht 63.0 in | Wt 132.0 lb

## 2016-02-09 DIAGNOSIS — R059 Cough, unspecified: Secondary | ICD-10-CM

## 2016-02-09 DIAGNOSIS — R05 Cough: Secondary | ICD-10-CM

## 2016-02-09 DIAGNOSIS — J45991 Cough variant asthma: Secondary | ICD-10-CM | POA: Diagnosis not present

## 2016-02-09 NOTE — Progress Notes (Signed)
Subjective:   Patient ID: Lorraine Gilbert, female    DOB: [birth year]  MRN: [id number]        Brief patient profile:  30 yf from [place] never smoker with cough and sob x decades previously eval by Dr Bernita Buffy referred by Dr Moreen Fowler 07/24/2012 for further evaluation of sob and cough with perfectly nl pfts 12/04/2013    History of Present Illness  07/24/2012 1st pulmonary eval cc cough x 2 months indolent onset progressively worse that is not better on advair, min prod of white mucus more day than night, assoc with mild sob. rec Stop advair Start dulera 100 Take 2 puffs first thing in am and then another 2 puffs about 12 hours later.  Pantoprazole (protonix) 40 mg  Take 30-60 min before first meal of the day and Pepcid 20 mg one bedtime until return to office - this is the best way to tell whether stomach acid is contributing to your problem.   GERD   If not better return here in 2 weeks all bottles/inhalers from all doctors or return to [place] and if you want to change to [facility] we will need you to bring your records with you as well >> did not return as rec     04/24/2015  f/u ov/Danon Lograsso re: chronic cough with nl spirometry and NO / off dulera ? Since when and on ppi ?80 mg before bfast? pepcid at hs  Chief Complaint  Patient presents with  . Follow-up    Increased cough x 5 wks- esp worse at night and occ prod with min, clear sputum- "feels like it gets stuck in my throat".  She has occ wheezing and chest tightness.   did not get the chlorpheniramine yet / very easily confused with details of care/ meds/ names/ refills   Not limited by breathing from desired activities  But rather by arthritis and fatigue  For drainage / throat tickle try take CHLORPHENIRAMINE  4 mg - take one every 4 hours as needed -   Change the protonix to Take 30- 60 min before your first and last meals of the day  GERD better. If not better restart dulera 100 Take 2 puffs first thing in am and then  another 2 puffs about 12 hours later.      . 08/08/2015  f/u ov/Kiowa Hollar re: chronic cough/? Cough variant asthma/ not using dulera regularly / now also on MTX/ no med calendar Chief Complaint  Patient presents with  . Follow-up    Pt states that her cough is much improved. She has had some increased SOB which she relates to humid weather. No new co's today.    hfa about 50% effective/ Not limited by breathing from desired activities  rec Work on inhaler technique:  relax and gently blow all the way out then take a nice smooth deep breath back in, triggering the inhaler at same time you start breathing in.  Hold for up to 5 seconds if you can. Blow out thru nose. Rinse and gargle with water when done [facility] to change dulera 100 to where only as needed for cough or short of breath > 2 puffs first thing in am and then another 2 puffs about 12 hours later if needed  Please schedule a follow up visit in 6 months but call sooner if needed     02/09/2016  f/u ov/Braun Rocca re: cough variant asthma with component of uacs/ irritable larynx on dulera 100 2bid  Chief Complaint  Patient presents with  . Follow-up    Pt c/o "itchy dry cough" and PND x 3 days. Breathing is overall doing well and no wheezing or tightness.    Not limited by breathing from desired activities      No obvious day to day or daytime variability or assoc chronic cough or cp or chest tightness, subjective wheeze or overt sinus or hb symptoms. No unusual exp hx or h/o childhood pna/ asthma or knowledge of premature birth.  Sleeping ok without nocturnal  or early am exacerbation  of respiratory  c/o's or need for noct saba. Also denies any obvious fluctuation of symptoms with weather or environmental changes or other aggravating or alleviating factors except as outlined above   Current Medications, Allergies, Complete Past Medical History, Past Surgical History, Family History, and Social History were reviewed in [employer] record.  ROS  The following are not active complaints unless bolded sore throat, dysphagia, dental problems, itching, sneezing,  nasal congestion or excess/ purulent secretions, ear ache,   fever, chills, sweats, unintended wt loss, classically pleuritic or exertional cp, hemoptysis,  orthopnea pnd or leg swelling, presyncope, palpitations, abdominal pain, anorexia, nausea, vomiting, diarrhea  or change in bowel or bladder habits, change in stools or urine, dysuria,hematuria,  rash, arthralgias moderate , visual complaints, headache, numbness, weakness or ataxia or problems with walking or coordination,  change in mood/affect or memory.                 Objective:   Physical Exam   amb pr female nad   08/14/2014        138 >  04/24/2015   137> 08/08/2015  136 > 02/09/2016  132   Vital signs reviewed   - Note on arrival 02 sats  100% on RA     HEENT: nl dentition, turbinates, and orophanx. Nl external ear canals without cough reflex   NECK :  without JVD/Nodes/TM/ nl carotid upstrokes bilaterally   LUNGS: no acc muscle use, clear to A and P bilaterally without cough on insp or exp maneuvers  CV:  RRR  no s3 or murmur or increase in P2, no edema   ABD:  soft and nontender with nl excursion in the supine position. No bruits or organomegaly, bowel sounds nl  MS:  warm without deformities, calf tenderness, cyanosis or clubbing    I personally reviewed images and agree with radiology impression as follows:  CXR:   12/17/15 No active cardiopulmonary disease.          Assessment & Plan:   Outpatient Encounter Prescriptions as of 02/09/2016  Medication Sig  . apixaban (ELIQUIS) 5 MG TABS tablet Take 5 mg by mouth 2 (two) times daily.  Marland Kitchen aspirin 81 MG tablet Take 81 mg by mouth every morning.   . calcium carbonate (OS-CAL) 600 MG tablet Take 600 mg by mouth every morning.   . digoxin (LANOXIN) 0.125 MG tablet Take 125 mcg by mouth every morning.   . diltiazem (CARDIZEM CD)  240 MG 24 hr capsule Take 240 mg by mouth every morning.   . dorzolamide (TRUSOPT) 2 % ophthalmic solution   . ergocalciferol (VITAMIN D2) 50000 UNITS capsule Take 50,000 Units by mouth once a week.  . escitalopram (LEXAPRO) 10 MG tablet Take 10 mg by mouth every morning.   . folic acid (FOLVITE) 1 MG tablet Take 1 mg by mouth every morning.   . methotrexate (RHEUMATREX) 2.5 MG tablet 6 tablets once  per wk per Dr. Trudie Reed  . mometasone-formoterol (DULERA) 100-5 MCG/ACT AERO Take 2 puffs first thing in am and then another 2 puffs about 12 hours later.  . pantoprazole (PROTONIX) 40 MG tablet Take 40 mg by mouth daily before breakfast.   . chlorpheniramine (CHLOR-TRIMETON) 4 MG tablet Take 4 mg by mouth every 4 (four) hours as needed (drippy nose, drainage, throat clearing).  Marland Kitchen dextromethorphan (DELSYM) 30 MG/5ML liquid Take 2 tsp every 12 hours as needed for cough   No facility-administered encounter medications on file as of 02/09/2016.

## 2016-02-09 NOTE — Patient Instructions (Signed)
Whenever coughing add pepcid 20 mg at bedtime  For drainage / throat tickle try take CHLORPHENIRAMINE  4 mg - take one every 4 hours as needed - available over the counter- may cause drowsiness so start with just a bedtime dose or two and see how you tolerate it before trying in daytime     If you are satisfied with your treatment plan,  let your doctor know and he/she can either refill your medications or you can return here when your prescription runs out.     If in any way you are not 100% satisfied,  please tell us.  If 100% better, tell your friends!  Pulmonary follow up is as needed

## 2016-02-15 ENCOUNTER — Encounter: Payer: Self-pay | Admitting: Internal Medicine

## 2016-02-15 NOTE — Assessment & Plan Note (Signed)
-   Note POS GERD on UGI  02/28/13   Most likely still has component of Upper airway cough syndrome (previously labeled PNDS) , is  so named because it's frequently impossible to sort out how much is  CR/sinusitis with freq throat clearing (which can be related to primary GERD)   vs  causing  secondary (" extra esophageal")  GERD from wide swings in gastric pressure that occur with throat clearing, often  promoting self use of mint and menthol lozenges that reduce the lower esophageal sphincter tone and exacerbate the problem further in a cyclical fashion.   These are the same pts (now being labeled as having "irritable larynx syndrome" by some cough centers) who not infrequently have a history of having failed to tolerate ace inhibitors,  dry powder inhalers or biphosphonates or report having atypical/extraesophageal reflux symptoms that don't respond to standard doses of PPI  and are easily confused as having aecopd or asthma flares by even experienced allergists/ pulmonologists (myself included).   rec add pepcid 20 mg hs prn noct cough and 1st gen h1 prn per guidelines/ f/u is prn  see avs for instructions unique to this ov  F/u can be prn with refills per [facility]

## 2016-02-15 NOTE — Assessment & Plan Note (Addendum)
-  10/18/2013  resume trial of dulera 100 2bid  -Med calendar 11/01/2013 > did not bring as instructed 12/04/13 - PFTs wnl 12/04/13 > ok to try off dulera - flare off dulera 08/14/14 > ok to resume dulera 100 2bid  - Spirometry 04/24/2015  wnl off dulera / even fef 25-75 - NO 04/24/2015  = 17  - 08/08/2015  p extensive coaching HFA effectiveness =    75%  From a baseline of 25%   All goals of chronic asthma control met including optimal function and elimination of symptoms with minimal need for rescue therapy on dulera 100 2bid    I had an extended final summary discussion with the patient reviewing all relevant studies completed to date and  lasting 15 to 20 minutes of a 25 minute visit on the following issues:   Contingencies discussed in full including contacting this office immediately if not controlling the symptoms using the rule of two's.     Each maintenance medication was reviewed in detail including most importantly the difference between maintenance and as needed and under what circumstances the prns are to be used.  Please see AVS for specific  Instructions which are unique to this visit and I personally typed out  which were reviewed in detail in writing with the patient and a copy provided.

## 2016-04-06 DIAGNOSIS — I38 Endocarditis, valve unspecified: Secondary | ICD-10-CM | POA: Insufficient documentation

## 2016-04-19 ENCOUNTER — Other Ambulatory Visit: Payer: Self-pay | Admitting: Family Medicine

## 2016-04-19 DIAGNOSIS — Z1231 Encounter for screening mammogram for malignant neoplasm of breast: Secondary | ICD-10-CM

## 2016-05-05 ENCOUNTER — Other Ambulatory Visit: Payer: Self-pay | Admitting: Family Medicine

## 2016-05-05 ENCOUNTER — Ambulatory Visit
Admission: RE | Admit: 2016-05-05 | Discharge: 2016-05-05 | Disposition: A | Discharge status: Home / Self Care | Payer: Medicare Other | Source: Ambulatory Visit | Attending: Family Medicine | Admitting: Family Medicine

## 2016-05-05 DIAGNOSIS — R0781 Pleurodynia: Secondary | ICD-10-CM

## 2016-05-07 ENCOUNTER — Ambulatory Visit
Admission: RE | Admit: 2016-05-07 | Discharge: 2016-05-07 | Disposition: A | Discharge status: Home / Self Care | Payer: Medicare Other | Source: Ambulatory Visit | Attending: Family Medicine | Admitting: Family Medicine

## 2016-05-07 DIAGNOSIS — Z1231 Encounter for screening mammogram for malignant neoplasm of breast: Secondary | ICD-10-CM

## 2016-09-17 ENCOUNTER — Other Ambulatory Visit: Payer: Self-pay | Admitting: Family Medicine

## 2016-09-17 DIAGNOSIS — R410 Disorientation, unspecified: Secondary | ICD-10-CM

## 2016-09-17 DIAGNOSIS — R413 Other amnesia: Secondary | ICD-10-CM

## 2016-09-17 DIAGNOSIS — R42 Dizziness and giddiness: Secondary | ICD-10-CM

## 2016-09-23 ENCOUNTER — Ambulatory Visit
Admission: RE | Admit: 2016-09-23 | Discharge: 2016-09-23 | Disposition: A | Discharge status: Home / Self Care | Payer: Medicare Other | Source: Ambulatory Visit | Attending: Family Medicine | Admitting: Family Medicine

## 2016-09-23 DIAGNOSIS — R413 Other amnesia: Secondary | ICD-10-CM

## 2016-09-23 DIAGNOSIS — R410 Disorientation, unspecified: Secondary | ICD-10-CM

## 2016-09-23 DIAGNOSIS — R42 Dizziness and giddiness: Secondary | ICD-10-CM

## 2016-10-05 ENCOUNTER — Ambulatory Visit (INDEPENDENT_AMBULATORY_CARE_PROVIDER_SITE_OTHER): Payer: Medicare Other | Admitting: Neurology

## 2016-10-05 ENCOUNTER — Encounter: Payer: Self-pay | Admitting: Neurology

## 2016-10-05 VITALS — BP 140/46 | HR 70 | Ht 63.0 in | Wt 135.0 lb

## 2016-10-05 DIAGNOSIS — G3184 Mild cognitive impairment, so stated: Secondary | ICD-10-CM

## 2016-10-05 MED ORDER — DONEPEZIL HCL 10 MG PO TABS: ORAL_TABLET | ORAL | 11 refills | Status: DC

## 2016-10-05 NOTE — Patient Instructions (Signed)
1. Start Aricept 10mg : take 1/2 tablet daily for 1 month, then increase to 1 tablet daily 2. Continue to monitor driving 3. Follow-up in 6 months, call for any changes  FALL PRECAUTIONS: Be cautious when walking. Scan the area for obstacles that may increase the risk of trips and falls. When getting up in the mornings, sit up at the edge of the bed for a few minutes before getting out of bed. Consider elevating the bed at the head end to avoid drop of blood pressure when getting up. Walk always in a well-lit room (use night lights in the walls). Avoid area rugs or power cords from appliances in the middle of the walkways. Use a walker or a cane if necessary and consider physical therapy for balance exercise. Get your eyesight checked regularly.  FINANCIAL OVERSIGHT: Supervision, especially oversight when making financial decisions or transactions is also recommended.  HOME SAFETY: Consider the safety of the kitchen when operating appliances like stoves, microwave oven, and blender. Consider having supervision and share cooking responsibilities until no longer able to participate in those. Accidents with firearms and other hazards in the house should be identified and addressed as well.  DRIVING: Regarding driving, in patients with progressive memory problems, driving will be impaired. We advise to have someone else do the driving if trouble finding directions or if minor accidents are reported. Independent driving assessment is available to determine safety of driving.  ABILITY TO BE LEFT ALONE: If patient is unable to contact 911 operator, consider using LifeLine, or when the need is there, arrange for someone to stay with patients. Smoking is a fire hazard, consider supervision or cessation. Risk of wandering should be assessed by caregiver and if detected at any point, supervision and safe proof recommendations should be instituted.  MEDICATION SUPERVISION: Inability to self-administer medication  needs to be constantly addressed. Implement a mechanism to ensure safe administration of the medications.  RECOMMENDATIONS FOR ALL PATIENTS WITH MEMORY PROBLEMS: 1. Continue to exercise (Recommend 30 minutes of walking everyday, or 3 hours every week) 2. Increase social interactions - continue going to [place] and enjoy social gatherings with friends and family 3. Eat healthy, avoid fried foods and eat more fruits and vegetables 4. Maintain adequate blood pressure, blood sugar, and blood cholesterol level. Reducing the risk of stroke and cardiovascular disease also helps promoting better memory. 5. Avoid stressful situations. Live a simple life and avoid aggravations. Organize your time and prepare for the next day in anticipation. 6. Sleep well, avoid any interruptions of sleep and avoid any distractions in the bedroom that may interfere with adequate sleep quality 7. Avoid sugar, avoid sweets as there is a strong link between excessive sugar intake, diabetes, and cognitive impairment We discussed the Mediterranean diet, which has been shown to help patients reduce the risk of progressive memory disorders and reduces cardiovascular risk. This includes eating fish, eat fruits and green leafy vegetables, nuts like almonds and hazelnuts, walnuts, and also use olive oil. Avoid fast foods and fried foods as much as possible. Avoid sweets and sugar as sugar use has been linked to worsening of memory function.  There is always a concern of gradual progression of memory problems. If this is the case, then we may need to adjust level of care according to patient needs. Support, both to the patient and caregiver, should then be put into place.

## 2016-10-05 NOTE — Progress Notes (Signed)
NEUROLOGY FOLLOW UP OFFICE NOTE  Lorraine Gilbert [id number]  HISTORY OF PRESENT ILLNESS: I had the pleasure of seeing Lorraine Gilbert in follow-up in the neurology clinic on 0918/2018.  The patient was last seen a year ago for worsening memory. MMSE in October 2017 was 29/30 (30/30 in August 2016). She feels her memory has been worsening. She also reports dizziness, which she describes as her head feeling funny. No spinning sensation. Around 3 weeks ago, she felt dizzy on the way to [facility] and drove for 5 minutes not knowing where she was. She had a head CT on 09/23/16 which I personally reviewed, no acute changes, there was mild diffuse atrophy with slightly greater frontal atrophy bilaterally than elsewhere, mild chronic microvascular disease. She sometimes forgets where she is when she is outside with a lot of people. She forgets names. She and her husband do bills together. She denies missing medications. She occasionally feels like she will fall backward, one time she got out of bed and fell to the side. She has mild frontal headaches when she has sinus issues. She feels the dizziness sometimes is related to her sinuses. She denies any vision changes, nausea/vomiting, dysarthria/dysphagia, neck/back pain, bowel/bladder dysfunction.   HPI 08/20/14: This is a pleasant 83 yo RH woman with a history of atrial fibrillation on anticoagulation with Eliquis, hypertension, hyperlipidemia, diabetes, anxiety, vitamin D deficiency, polymyalgia rheumatica, who presented for evaluation of worsening memory. She started noticing symptoms over the past few months, mostly with short-term memory, she would forget things, occasionally forget her medications, or get disoriented when driving in unfamiliar places. She has noticed some word-finding difficulties. There are times she would "feel lost" when in crowded places like the mall last week. She denies any missed bill payments, she cooks and drives without difficulties, no  difficulties with ADLs.  She was found to have atrial fibrillation after episodes of syncope 2-3 years ago. She has had brief sharp pains in the occipital region lasting a few seconds occurring around once a week, with associated photophobia, no nausea/vomiting. She has occasional dizziness. She denies any vision changes except for blurred vision from cataracts and glaucoma. She has chronic neck and back pain. She has numbness and tingling in both hands, and occasional pain in her calves. She denies any bowel/bladder dysfunction, no anosmia or tremors. She reports her mother had "the beginning of Alzheimer's" at age 63. She had a head injury when younger, hit her head and was unconscious until she woke up in the hospital. She denies any alcohol intake.  Records and images were personally reviewed where available.  I personally reviewed MRI brain without contrast which did not show any acute changes. There was mild diffuse atrophy and mild chronic microvascular disease. TSH and B12 normal.   PAST MEDICAL HISTORY: Past Medical History:  Diagnosis Date  . Abnormal heart rhythm   . Allergic rhinitis   . Anxiety   . Diabetes mellitus without complication ([place])   . GERD (gastroesophageal reflux disease)   . Headache   . Hypertension   . Osteopenia   . Rheumatoid arthritis ([place])   . Vertigo     MEDICATIONS: Current Outpatient Prescriptions on File Prior to Visit  Medication Sig Dispense Refill  . apixaban (ELIQUIS) 5 MG TABS tablet Take 5 mg by mouth 2 (two) times daily.    Marland Kitchen aspirin 81 MG tablet Take 81 mg by mouth every morning.     . calcium carbonate (OS-CAL) 600 MG tablet Take 600  mg by mouth every morning.     . chlorpheniramine (CHLOR-TRIMETON) 4 MG tablet Take 4 mg by mouth every 4 (four) hours as needed (drippy nose, drainage, throat clearing).    Marland Kitchen dextromethorphan (DELSYM) 30 MG/5ML liquid Take 2 tsp every 12 hours as needed for cough    . digoxin (LANOXIN) 0.125 MG tablet Take 125  mcg by mouth every morning.     . diltiazem (CARDIZEM CD) 240 MG 24 hr capsule Take 240 mg by mouth every morning.     . dorzolamide (TRUSOPT) 2 % ophthalmic solution     . ergocalciferol (VITAMIN D2) 50000 UNITS capsule Take 50,000 Units by mouth once a week.    . escitalopram (LEXAPRO) 10 MG tablet Take 10 mg by mouth every morning.     . folic acid (FOLVITE) 1 MG tablet Take 1 mg by mouth every morning.     . methotrexate (RHEUMATREX) 2.5 MG tablet 6 tablets once per wk per Dr. Trudie Reed    . mometasone-formoterol (DULERA) 100-5 MCG/ACT AERO Take 2 puffs first thing in am and then another 2 puffs about 12 hours later. 1 Inhaler 11  . pantoprazole (PROTONIX) 40 MG tablet Take 40 mg by mouth daily before breakfast.      No current facility-administered medications on file prior to visit.     ALLERGIES: Allergies  Allergen Reactions  . Motrin [Ibuprofen]   . Morphine Rash    Patient says it makes her feel like she is flying.     FAMILY HISTORY: Family History  Problem Relation Age of Onset  . Emphysema Father        smoked  . Heart disease Mother   . Breast cancer Neg Hx     SOCIAL HISTORY: Social History   Social History  . Marital status: Married    Spouse name: N/A  . Number of children: 6  . Years of education: N/A   Occupational History  . Retired    Social History Main Topics  . Smoking status: Never Smoker  . Smokeless tobacco: Never Used  . Alcohol use No  . Drug use: No  . Sexual activity: Not on file   Other Topics Concern  . Not on file   Social History Narrative  . No narrative on file    REVIEW OF SYSTEMS: Constitutional: No fevers, chills, or sweats, no generalized fatigue, change in appetite Eyes: No visual changes, double vision, eye pain Ear, nose and throat: No hearing loss, ear pain, nasal congestion, sore throat Cardiovascular: No chest pain, palpitations Respiratory:  No shortness of breath at rest or with exertion,  wheezes GastrointestinaI: No nausea, vomiting, diarrhea, abdominal pain, fecal incontinence Genitourinary:  No dysuria, urinary retention or frequency Musculoskeletal:  No neck pain, back pain Integumentary: No rash, pruritus, skin lesions Neurological: as above Psychiatric: No depression, insomnia, anxiety Endocrine: No palpitations, fatigue, diaphoresis, mood swings, change in appetite, change in weight, increased thirst Hematologic/Lymphatic:  No anemia, purpura, petechiae. Allergic/Immunologic: no itchy/runny eyes, nasal congestion, recent allergic reactions, rashes  PHYSICAL EXAM: Vitals:   10/05/16 1516  BP: (!) 140/46  Pulse: 70  SpO2: 97%   General: No acute distress Head:  Normocephalic/atraumatic Neck: supple, no paraspinal tenderness, full range of motion Heart:  Regular rate and rhythm Lungs:  Clear to auscultation bilaterally Back: No paraspinal tenderness Skin/Extremities: No rash, no edema Neurological Exam: alert and oriented to person, place, and time. No aphasia or dysarthria. Fund of knowledge is appropriate.  Recent and remote  memory are intact.  Attention and concentration are normal.    Able to name objects and repeat phrases. CDT 5/5.  MMSE - Mini Mental State Exam 10/05/2016 11/18/2015 08/20/2014  Orientation to time 4 5 5   Orientation to Place 5 5 5   Registration 3 3 3   Attention/ Calculation 5 5 5   Recall 2 2 3   Language- name 2 objects 2 2 2   Language- repeat 1 1 1   Language- follow 3 step command 3 3 3   Language- read & follow direction 1 1 1   Write a sentence 1 1 1   Copy design 1 1 1   Total score 28 29 30    Cranial nerves: Pupils equal, round, reactive to light. Extraocular movements intact with no nystagmus. Visual fields full. Facial sensation intact. No facial asymmetry. Tongue, uvula, palate midline.  Motor: Bulk and tone normal, muscle strength 5/5 throughout with no pronator drift.  Sensation to all modalities on both UE, decreased vibration to  ankles bilaterally, intact pin and cold. No extinction to double simultaneous stimulation.  Deep tendon reflexes +1 throughout except for absent ankle jerks bilaterally, toes downgoing.  Finger to nose testing intact.  Gait narrow-based and steady, mild difficulty with tandem walk. Able to rise with arms crossed over chest.  Romberg negative.  IMPRESSION: This is a pleasant 83 yo RH woman with vascular risk factors including hypertension, hyperlipidemia, diabetes, atrial fibrillation on Eliquis, PMR, who presented with worsening memory and stabbing headaches. Her MMSE today is normal 28/30 (29/30 in October 2017, 30/30 in August 2016). There has been very gradual decline over the few years, she reports occasional instances of confusion. She is agreeable to starting Aricept 5mg  daily for a month, then increase to 10mg  daily. Side effects and expectations from the medication were discussed. She is also reporting dizziness, that she describes as "feeling funny" rather than vertiginous. Recent head CT no acute changes. Neurological exam shows mild neuropathy, otherwise non-focal. Continue to monitor. We discussed driving, if she starts noticing more difficulties, would stop driving. She will follow-up in 6 months and knows to call for any changes.   Thank you for allowing me to participate in her care.  Please do not hesitate to call for any questions or concerns.  The duration of this appointment visit was 25 minutes of face-to-face time with the patient.  Greater than 50% of this time was spent in counseling, explanation of diagnosis, planning of further management, and coordination of care.   Ellouise Newer, M.D.   CC: Dr. Moreen Fowler

## 2016-10-10 ENCOUNTER — Encounter: Payer: Self-pay | Admitting: Neurology

## 2016-10-10 DIAGNOSIS — G3184 Mild cognitive impairment, so stated: Secondary | ICD-10-CM | POA: Insufficient documentation

## 2016-11-15 ENCOUNTER — Ambulatory Visit (INDEPENDENT_AMBULATORY_CARE_PROVIDER_SITE_OTHER): Payer: Medicare Other | Admitting: Internal Medicine

## 2016-11-15 ENCOUNTER — Ambulatory Visit (INDEPENDENT_AMBULATORY_CARE_PROVIDER_SITE_OTHER)
Admission: RE | Admit: 2016-11-15 | Discharge: 2016-11-15 | Disposition: A | Discharge status: Home / Self Care | Payer: Medicare Other | Source: Ambulatory Visit | Attending: Internal Medicine | Admitting: Internal Medicine

## 2016-11-15 ENCOUNTER — Encounter: Payer: Self-pay | Admitting: Internal Medicine

## 2016-11-15 VITALS — BP 126/74 | HR 70 | Ht 63.0 in | Wt 137.0 lb

## 2016-11-15 DIAGNOSIS — M069 Rheumatoid arthritis, unspecified: Secondary | ICD-10-CM

## 2016-11-15 DIAGNOSIS — J45991 Cough variant asthma: Secondary | ICD-10-CM | POA: Diagnosis not present

## 2016-11-15 DIAGNOSIS — R058 Other specified cough: Secondary | ICD-10-CM

## 2016-11-15 DIAGNOSIS — R05 Cough: Secondary | ICD-10-CM | POA: Diagnosis not present

## 2016-11-15 MED ORDER — BUDESONIDE-FORMOTEROL FUMARATE 80-4.5 MCG/ACT IN AERO: 2.0000 | INHALATION_SPRAY | Freq: Two times a day (BID) | RESPIRATORY_TRACT | 0 refills | Status: DC

## 2016-11-15 MED ORDER — BUDESONIDE-FORMOTEROL FUMARATE 80-4.5 MCG/ACT IN AERO: 2.0000 | INHALATION_SPRAY | Freq: Two times a day (BID) | RESPIRATORY_TRACT | 11 refills | Status: DC

## 2016-11-15 NOTE — Patient Instructions (Addendum)
Symbicort 80 Take 2 puffs first thing in am and then another 2 puffs about 12 hours later to see if helps the cough and then try off and if helps we can call it in, if doesn't we need to see you back here with all medications in hand   Work on inhaler technique:  relax and gently blow all the way out then take a nice smooth deep breath back in, triggering the inhaler at same time you start breathing in.  Hold for up to 5 seconds if you can. Blow out thru nose. Rinse and gargle with water when done    Whenever coughing>>   add pepcid 20 mg at bedtime For drainage / throat tickle >>   take CHLORPHENIRAMINE  4 mg - take one every 4 hours as needed     Please remember to go to the  x-ray department downstairs in the basement  for your tests - we will call you with the results when they are available.      Please schedule a follow up office visit in 6 weeks, call sooner if needed with pfts on return and bring all your medications

## 2016-11-15 NOTE — Progress Notes (Signed)
Subjective:   Patient ID: Lorraine Gilbert, female    DOB: January 01, 1933  MRN: [id number]        Brief patient profile:  79 yf from [place] never smoker with cough and sob x decades previously eval by Dr Bernita Buffy referred by Dr Moreen Fowler 07/24/2012 for further evaluation of sob and cough with perfectly nl pfts 12/04/2013    History of Present Illness  07/24/2012 1st pulmonary eval cc cough x 2 months indolent onset progressively worse that is not better on advair, min prod of white mucus more day than night, assoc with mild sob. rec Stop advair Start dulera 100 Take 2 puffs first thing in am and then another 2 puffs about 12 hours later.  Pantoprazole (protonix) 40 mg  Take 30-60 min before first meal of the day and Pepcid 20 mg one bedtime until return to office - this is the best way to tell whether stomach acid is contributing to your problem.   GERD   If not better return here in 2 weeks all bottles/inhalers from all doctors or return to [facility] and if you want to change to [facility] we will need you to bring your records with you as well >> did not return as rec     04/24/2015  f/u ov/Wert re: chronic cough with nl spirometry and NO / off dulera ? Since when and on ppi ?80 mg before bfast? pepcid at hs  Chief Complaint  Patient presents with  . Follow-up    Increased cough x 5 wks- esp worse at night and occ prod with min, clear sputum- "feels like it gets stuck in my throat".  She has occ wheezing and chest tightness.   did not get the chlorpheniramine yet / very easily confused with details of care/ meds/ names/ refills   Not limited by breathing from desired activities  But rather by arthritis and fatigue  For drainage / throat tickle try take CHLORPHENIRAMINE  4 mg - take one every 4 hours as needed -   Change the protonix to Take 30- 60 min before your first and last meals of the day  GERD better. If not better restart dulera 100 Take 2 puffs first thing in am and then  another 2 puffs about 12 hours later.      . 08/08/2015  f/u ov/Wert re: chronic cough/? Cough variant asthma/ not using dulera regularly / now also on MTX/ no med calendar Chief Complaint  Patient presents with  . Follow-up    Pt states that her cough is much improved. She has had some increased SOB which she relates to humid weather. No new co's today.    hfa about 50% effective/ Not limited by breathing from desired activities  rec Work on inhaler technique:  relax and gently blow all the way out then take a nice smooth deep breath back in, triggering the inhaler at same time you start breathing in.  Hold for up to 5 seconds if you can. Blow out thru nose. Rinse and gargle with water when done [facility] to change dulera 100 to where only as needed for cough or short of breath > 2 puffs first thing in am and then another 2 puffs about 12 hours later if needed  Please schedule a follow up visit in 6 months but call sooner if needed     02/09/2016  f/u ov/Wert re: cough variant asthma with component of uacs/ irritable larynx on dulera 100 2bid  Chief Complaint  Patient presents with  . Follow-up    Pt c/o "itchy dry cough" and PND x 3 days. Breathing is overall doing well and no wheezing or tightness.    Not limited by breathing from desired activities   rec Whenever coughing add pepcid 20 mg at bedtime For drainage / throat tickle try take CHLORPHENIRAMINE  4 mg - take one every 4 hours as needed     11/15/2016  Acute extended  ov/Wert re: evolving chronic cough  Chief Complaint  Patient presents with  . Acute Visit    Pt c/o cough for the pasgt 3-4 wks- prod with clear sputum.     did not add prns as above/ still taking dulera 100 2bid  Cough and then mtx added definitely after onset of cough  r  Off dulera x 2 months but just using dulera samples/ not on maint rx  Really Not limited by breathing from desired activities  But very inactive due to arthritis  Cough is worse day than  noct   No obvious other patterns in  day to day or daytime variability or assoc  purulent sputum or mucus plugs or hemoptysis or cp or chest tightness, subjective wheeze or overt sinus or hb symptoms. No unusual exp hx or h/o childhood pna/ asthma or knowledge of premature birth.  Sleeping ok flat without nocturnal  or early am exacerbation  of respiratory  c/o's or need for noct saba. Also denies any obvious fluctuation of symptoms with weather or environmental changes or other aggravating or alleviating factors except as outlined above   Current Allergies, Complete Past Medical History, Past Surgical History, Family History, and Social History were reviewed in [employer] record.  ROS  The following are not active complaints unless bolded Hoarseness, sore throat, dysphagia, dental problems, itching, sneezing,  nasal congestion or discharge of excess mucus or purulent secretions, ear ache,   fever, chills, sweats, unintended wt loss or wt gain, classically pleuritic or exertional cp,  orthopnea pnd or leg swelling, presyncope, palpitations, abdominal pain, anorexia, nausea, vomiting, diarrhea  or change in bowel habits or change in bladder habits, change in stools or change in urine, dysuria, hematuria,  rash, arthralgias, visual complaints, headache, numbness, weakness or ataxia or problems with walking or coordination,  change in mood/affect or memory.        Current Meds  Medication Sig  . apixaban (ELIQUIS) 5 MG TABS tablet Take 5 mg by mouth 2 (two) times daily.  Marland Kitchen aspirin 81 MG tablet Take 81 mg by mouth every morning.   . calcium carbonate (OS-CAL) 600 MG tablet Take 600 mg by mouth every morning.   . digoxin (LANOXIN) 0.125 MG tablet Take 125 mcg by mouth every morning.   . donepezil (ARICEPT) 10 MG tablet Take 1/2 tablet daily for 1 month, then increase to 1 tablet daily  . escitalopram (LEXAPRO) 10 MG tablet Take 10 mg by mouth every morning.   . folic acid  (FOLVITE) 1 MG tablet Take 1 mg by mouth every morning.   . methotrexate (RHEUMATREX) 2.5 MG tablet 4 tablets once per wk per Dr. Trudie Reed  . pantoprazole (PROTONIX) 40 MG tablet Take 40 mg by mouth daily before breakfast.                     Objective:   Physical Exam   amb pr female nad   08/14/2014        138 >  04/24/2015  137> 08/08/2015  136 > 02/09/2016  132 >  11/15/2016    137   Vital signs reviewed   - Note on arrival 02 sats  99% on RA     HEENT: nl dentition, turbinates, and orophanx. Nl external ear canals without cough reflex   NECK :  without JVD/Nodes/TM/ nl carotid upstrokes bilaterally   LUNGS: no acc muscle use, clear to A and P bilaterally without cough on insp or exp maneuvers  CV:  RRR  no s3 or murmur or increase in P2, no edema   ABD:  soft and nontender with nl excursion in the supine position. No bruits or organomegaly, bowel sounds nl  MS:  warm without deformities, calf tenderness, cyanosis or clubbing    CXR PA and Lateral:   11/15/2016 :    I personally reviewed images and agree with radiology impression as follows:    Cardiomegaly without decompensation.         Assessment & Plan:

## 2016-11-16 NOTE — Progress Notes (Signed)
LMTCB

## 2016-11-17 ENCOUNTER — Ambulatory Visit: Payer: Medicare Other | Admitting: Neurology

## 2016-11-17 ENCOUNTER — Telehealth: Payer: Self-pay | Admitting: Internal Medicine

## 2016-11-17 NOTE — Telephone Encounter (Signed)
Notes recorded by Tanda Rockers, MD on 11/16/2016 at 1:05 PM EDT Call pt: Reviewed cxr and no acute change so no change in recommendations made at [facility]  Advised pt of results. Pt understood and nothing further is needed.

## 2016-11-17 NOTE — Assessment & Plan Note (Addendum)
On Methotrexate per Dr Trudie Reed as of ov 12/04/13 - PFT's wnl 12/04/13 with dlco 79%  - MTX restarted around Nov 01 2016 > rec repeat pfts about mid Dec 2018 for new baseline

## 2016-11-17 NOTE — Assessment & Plan Note (Signed)
-   Note POS GERD on UGI  02/28/13   Of the three most common causes of  Sub-acute or recurrent or chronic cough, only one (GERD)  can actually contribute to/ trigger  the other two (asthma and post nasal drip syndrome)  and perpetuate the cylce of cough.  While not intuitively obvious, many patients with chronic low grade reflux do not cough until there is a primary insult that disturbs the protective epithelial barrier and exposes sensitive nerve endings.   This is typically viral but can be direct physical injury such as with an endotracheal tube.   The point is that once this occurs, it is difficult to eliminate the cycle  using anything but a maximally effective acid suppression regimen at least in the short run, accompanied by an appropriate diet to address non acid GERD / reviewed > add pepcid 20 mg hs to the ppi main q am ac whenever coughing flares

## 2016-11-17 NOTE — Assessment & Plan Note (Signed)
-   10/18/2013  resume trial of dulera 100 2bid  -Med calendar 11/01/2013 > did not bring as instructed 12/04/13 - PFTs wnl 12/04/13 > ok to try off dulera - flare off dulera 08/14/14 > ok to resume dulera 100 2bid  - Spirometry 04/24/2015  wnl off dulera / even fef 25-75 - NO 04/24/2015  = 17   - 11/15/2016  After extensive coaching HFA effectiveness =    75% try symbicort 80 2bid    Having flare x last 3-4 weeks ? Trigger but no evidence of mtx tox/ cardiac asthma or allergic asthma at present nor ILD or any other complication of RA  Or RA med effects  Will start with rx with symbicort and return in 4-6 weeks for repeat  baseline pfts    I had an extended discussion with the patient reviewing all relevant studies completed to date and  lasting 15 to 20 minutes of a 25 minute acute visit    Each maintenance medication was reviewed in detail including most importantly the difference between maintenance and prns and under what circumstances the prns are to be triggered using an action plan format that is not reflected in the computer generated alphabetically organized AVS.    Please see AVS for specific instructions unique to this visit that I personally wrote and verbalized to the the pt in detail and then reviewed with pt  by my nurse highlighting any  changes in therapy recommended at today's visit to their plan of care.

## 2016-11-18 NOTE — Progress Notes (Signed)
Spoke with pt and notified of results per Dr. Wert. Pt verbalized understanding and denied any questions. 

## 2016-12-28 ENCOUNTER — Ambulatory Visit: Payer: Medicare Other | Admitting: Internal Medicine

## 2017-01-06 ENCOUNTER — Encounter: Payer: Self-pay | Admitting: Internal Medicine

## 2017-01-06 ENCOUNTER — Ambulatory Visit: Payer: Medicare Other | Admitting: Internal Medicine

## 2017-01-06 ENCOUNTER — Ambulatory Visit (INDEPENDENT_AMBULATORY_CARE_PROVIDER_SITE_OTHER): Payer: Medicare Other | Admitting: Internal Medicine

## 2017-01-06 VITALS — BP 122/58 | HR 61 | Ht 61.0 in | Wt 133.0 lb

## 2017-01-06 DIAGNOSIS — M05749 Rheumatoid arthritis with rheumatoid factor of unspecified hand without organ or systems involvement: Secondary | ICD-10-CM | POA: Diagnosis not present

## 2017-01-06 DIAGNOSIS — R05 Cough: Secondary | ICD-10-CM

## 2017-01-06 DIAGNOSIS — M069 Rheumatoid arthritis, unspecified: Secondary | ICD-10-CM

## 2017-01-06 DIAGNOSIS — J45991 Cough variant asthma: Secondary | ICD-10-CM

## 2017-01-06 DIAGNOSIS — R058 Other specified cough: Secondary | ICD-10-CM

## 2017-01-06 LAB — PULMONARY FUNCTION TEST
DL/VA % pred: 100 %
DL/VA: 4.44 ml/min/mmHg/L
DLCO COR: 13.9 ml/min/mmHg
DLCO UNC % PRED: 67 %
DLCO cor % pred: 68 %
DLCO unc: 13.59 ml/min/mmHg
FEF 25-75 PRE: 1.76 L/s
FEF 25-75 Post: 2.32 L/sec
FEF2575-%Change-Post: 32 %
FEF2575-%PRED-PRE: 159 %
FEF2575-%Pred-Post: 210 %
FEV1-%Change-Post: 4 %
FEV1-%PRED-PRE: 97 %
FEV1-%Pred-Post: 101 %
FEV1-POST: 1.59 L
FEV1-Pre: 1.52 L
FEV1FVC-%Change-Post: 4 %
FEV1FVC-%Pred-Pre: 113 %
FEV6-%CHANGE-POST: 0 %
FEV6-%PRED-POST: 91 %
FEV6-%PRED-PRE: 92 %
FEV6-PRE: 1.84 L
FEV6-Post: 1.84 L
FEV6FVC-%PRED-PRE: 106 %
FEV6FVC-%Pred-Post: 106 %
FVC-%Change-Post: 0 %
FVC-%Pred-Post: 86 %
FVC-%Pred-Pre: 86 %
FVC-Post: 1.84 L
FVC-Pre: 1.84 L
POST FEV1/FVC RATIO: 86 %
POST FEV6/FVC RATIO: 100 %
Pre FEV1/FVC ratio: 83 %
Pre FEV6/FVC Ratio: 100 %
RV % pred: 121 %
RV: 2.77 L
TLC % PRED: 100 %
TLC: 4.62 L

## 2017-01-06 NOTE — Progress Notes (Signed)
PFT done today. 

## 2017-01-06 NOTE — Progress Notes (Signed)
Subjective:   Patient ID: Lorraine Gilbert, female    DOB: 09/19/1932  MRN: [id number]        Brief patient profile:  3 yf from [place] never smoker with cough and sob x decades previously eval by Dr Bernita Buffy referred by Dr Moreen Fowler 07/24/2012 for further evaluation of sob and cough with perfectly nl pfts 12/04/2013       History of Present Illness  07/24/2012 1st pulmonary eval cc cough x 2 months indolent onset progressively worse that is not better on advair, min prod of white mucus more day than night, assoc with mild sob. rec Stop advair Start dulera 100 Take 2 puffs first thing in am and then another 2 puffs about 12 hours later.  Pantoprazole (protonix) 40 mg  Take 30-60 min before first meal of the day and Pepcid 20 mg one bedtime until return to office - this is the best way to tell whether stomach acid is contributing to your problem.   GERD   If not better return here in 2 weeks all bottles/inhalers from all doctors or return to [place] and if you want to change to [facility] we will need you to bring your records with you as well >> did not return as rec     04/24/2015  f/u ov/Conner Muegge re: chronic cough with nl spirometry and NO / off dulera ? Since when and on ppi ?80 mg before bfast? pepcid at hs  Chief Complaint  Patient presents with  . Follow-up    Increased cough x 5 wks- esp worse at night and occ prod with min, clear sputum- "feels like it gets stuck in my throat".  She has occ wheezing and chest tightness.   did not get the chlorpheniramine yet / very easily confused with details of care/ meds/ names/ refills   Not limited by breathing from desired activities  But rather by arthritis and fatigue  For drainage / throat tickle try take CHLORPHENIRAMINE  4 mg - take one every 4 hours as needed -   Change the protonix to Take 30- 60 min before your first and last meals of the day  GERD better. If not better restart dulera 100 Take 2 puffs first thing in am and  then another 2 puffs about 12 hours later.      . 08/08/2015  f/u ov/Tawney Vanorman re: chronic cough/? Cough variant asthma/ not using dulera regularly / now also on MTX/ no med calendar Chief Complaint  Patient presents with  . Follow-up    Pt states that her cough is much improved. She has had some increased SOB which she relates to humid weather. No new co's today.    hfa about 50% effective/ Not limited by breathing from desired activities  rec Work on inhaler technique:  relax and gently blow all the way out then take a nice smooth deep breath back in, triggering the inhaler at same time you start breathing in.  Hold for up to 5 seconds if you can. Blow out thru nose. Rinse and gargle with water when done [facility] to change dulera 100 to where only as needed for cough or short of breath > 2 puffs first thing in am and then another 2 puffs about 12 hours later if needed  Please schedule a follow up visit in 6 months but call sooner if needed     02/09/2016  f/u ov/Alberto Schoch re: cough variant asthma with component of uacs/ irritable larynx on dulera 100 2bid  Chief Complaint  Patient presents with  . Follow-up    Pt c/o "itchy dry cough" and PND x 3 days. Breathing is overall doing well and no wheezing or tightness.    Not limited by breathing from desired activities   rec Whenever coughing add pepcid 20 mg at bedtime For drainage / throat tickle try take CHLORPHENIRAMINE  4 mg - take one every 4 hours as needed     11/15/2016  Acute extended  ov/Madhav Mohon re: evolving chronic cough  Chief Complaint  Patient presents with  . Acute Visit    Pt c/o cough for the pasgt 3-4 wks- prod with clear sputum.     did not add prns as above/ still taking dulera 100 2bid  Cough and then mtx added definitely after onset of cough    Off dulera x 2 months  Still using dulera samples prn/ not on maint rx  Really Not limited by breathing from desired activities  But very inactive due to arthritis  Cough is worse day  than noct rec Symbicort 80 Take 2 puffs first thing in am and then another 2 puffs about 12 hours later  Work on inhaler technique:  relax and gently blow all the way out then take a nice smooth deep breath back in, triggering the inhaler at same time you start breathing in.  Hold for up to 5 seconds if you can. Blow out thru nose. Rinse and gargle with water when done Whenever coughing>>   add pepcid 20 mg at bedtime For drainage / throat tickle >>   take CHLORPHENIRAMINE  4 mg - take one every 4 hours as needed  Please schedule a follow up office visit in 6 weeks, call sooner if needed with pfts on return and bring all your medications    01/06/2017  f/u ov/Kensey Luepke re: cough > sob  Did not bring meds/arthritis worse than usual / no better on symb but hfa quite poor  Chief Complaint  Patient presents with  . Follow-up    PFT's done today. Breathing is unchanged.    cough is most bothersome w/in 15 min of hs / then sleeps ok after that  Not limited by breathing from desired activities  But by arthritis   No obvious day to day or daytime variability or assoc excess/ purulent sputum or mucus plugs or hemoptysis or cp or chest tightness, subjective wheeze or overt sinus or hb symptoms. No unusual exposure hx or h/o childhood pna/ asthma or knowledge of premature birth.  Sleeping ok flat (p initial cough) without nocturnal  or early am exacerbation  of respiratory  c/o's or need for noct saba. Also denies any obvious fluctuation of symptoms with weather or environmental changes or other aggravating or alleviating factors except as outlined above   Current Allergies, Complete Past Medical History, Past Surgical History, Family History, and Social History were reviewed in [employer] record.  ROS  The following are not active complaints unless bolded Hoarseness, sore throat, dysphagia, dental problems, itching, sneezing,  nasal congestion or discharge of excess mucus or  purulent secretions, ear ache,   fever, chills, sweats, unintended wt loss or wt gain, classically pleuritic or exertional cp,  orthopnea pnd or leg swelling, presyncope, palpitations, abdominal pain, anorexia, nausea, vomiting, diarrhea  or change in bowel habits or change in bladder habits, change in stools or change in urine, dysuria, hematuria,  rash, arthralgias, visual complaints, headache, numbness, weakness or ataxia or problems with walking or  coordination,  change in mood/affect or memory.        Current Meds  Medication Sig  . apixaban (ELIQUIS) 5 MG TABS tablet Take 5 mg by mouth 2 (two) times daily.  Marland Kitchen aspirin 81 MG tablet Take 81 mg by mouth every morning.   . budesonide-formoterol (SYMBICORT) 80-4.5 MCG/ACT inhaler Inhale 2 puffs into the lungs 2 (two) times daily.  . calcium carbonate (OS-CAL) 600 MG tablet Take 600 mg by mouth every morning.   . digoxin (LANOXIN) 0.125 MG tablet Take 125 mcg by mouth every morning.   . donepezil (ARICEPT) 10 MG tablet Take 1/2 tablet daily for 1 month, then increase to 1 tablet daily  . escitalopram (LEXAPRO) 10 MG tablet Take 10 mg by mouth every morning.   . folic acid (FOLVITE) 1 MG tablet Take 1 mg by mouth every morning.   . methotrexate (RHEUMATREX) 2.5 MG tablet 4 tablets once per wk per Dr. Trudie Reed  . pantoprazole (PROTONIX) 40 MG tablet Take 40 mg by mouth daily before breakfast.                      Objective:   Physical Exam  [place] amb PR female nad   08/14/2014        138 >  04/24/2015   137> 08/08/2015  136 > 02/09/2016  132 >  11/15/2016    137 > 01/06/2017  133   Vital signs reviewed - Note on arrival 02 sats  98% on RA    HEENT: nl dentition, turbinates bilaterally, and oropharynx. Nl external ear canals without cough reflex   NECK :  without JVD/Nodes/TM/ nl carotid upstrokes bilaterally   LUNGS: no acc muscle use,  Nl contour chest which is clear to A and P bilaterally without cough on insp or exp  maneuvers   CV:  RRR  no s3 or murmur or increase in P2, and no edema   ABD:  soft and nontender with nl inspiratory excursion in the supine position. No bruits or organomegaly appreciated, bowel sounds nl  MS:  Nl gait/ ext warm without deformities, calf tenderness, cyanosis or clubbing RA changes both hands    SKIN: warm and dry without lesions    NEURO:  alert, approp, nl sensorium with  no motor or cerebellar deficits apparent.             CXR PA and Lateral:   11/15/2016 :    I personally reviewed images and agree with radiology impression as follows:    Cardiomegaly without decompensation.         Assessment & Plan:

## 2017-01-06 NOTE — Patient Instructions (Addendum)
Add pepcid ac 20 mg about an hour before bedtime  For drainage / throat tickle try take CHLORPHENIRAMINE  4 mg - take one every 4 hours as needed - available over the counter- may cause drowsiness so start with just a bedtime dose (take one hour before) or two and see how you tolerate it before trying in daytime     GERD (REFLUX)  is an extremely common cause of respiratory symptoms just like yours , many times with no obvious heartburn at all.    It can be treated with medication, but also with lifestyle changes including elevation of the head of your bed (ideally with 6 inch  bed blocks),  Smoking cessation, avoidance of late meals, excessive alcohol, and avoid fatty foods, chocolate, peppermint, colas, red wine, and acidic juices such as orange juice.  NO MINT OR MENTHOL PRODUCTS SO NO COUGH DROPS  USE SUGARLESS CANDY INSTEAD (Jolley ranchers or Stover's or Life Savers) or even ice chips will also do - the key is to swallow to prevent all throat clearing. NO OIL BASED VITAMINS - use powdered substitutes.     If not satisfied with your breathing or coughing, return with all medications / inhalers  In hand

## 2017-01-07 ENCOUNTER — Encounter: Payer: Self-pay | Admitting: Internal Medicine

## 2017-01-07 NOTE — Assessment & Plan Note (Addendum)
-   10/18/2013  resume trial of dulera 100 2bid  -Med calendar 11/01/2013 > did not bring as instructed 12/04/13 - PFTs wnl 12/04/13 > ok to try off dulera - flare off dulera 08/14/14 > ok to resume dulera 100 2bid  - Spirometry 04/24/2015  wnl off dulera / even fef 25-75 - NO 04/24/2015  = 17  - 11/15/2016  try symbicort 80 2bid   - PFT's  01/06/2017  FEV1 1.59 (101 % ) ratio 86  p 4 % improvement from saba p symb 80 x 2 x 12h  prior to study with DLCO  67/68c % corrects to 100 % for alv volume   - 01/06/2017  After extensive coaching inhaler device  effectiveness =  10%    So ok to leave symbicort off at this point  I doubt the cough is related to asthma esp since most bothersome at hs but really not noct so rec h2 hs and 1st gen H1 blockers per guidelines  And return if not better with all meds in hand to regroup then    Each maintenance medication was reviewed in detail including most importantly the difference between maintenance and as needed and under what circumstances the prns are to be used.  Please see AVS for specific  Instructions which are unique to this visit and I personally typed out  which were reviewed in detail in writing with the patient and a copy provided.

## 2017-01-07 NOTE — Assessment & Plan Note (Signed)
-   Note POS GERD on UGI  02/28/13  - 01/06/2017  added h2 and 1st gen H1 blockers per guidelines  At HS > f/u prn

## 2017-01-07 NOTE — Assessment & Plan Note (Signed)
On Methotrexate per Dr Trudie Reed as of ov 12/04/13 - PFT's wnl 12/04/13 with dlco 79% - MTX restarted around Nov 01 2016  - repeat pfts 01/06/2017  - PFT's  FVC 1.84 (97%)    with DLCO  67/68c % corrects to 100 % for alv volume    I had an extended final summary discussion with the patient reviewing all relevant studies completed to date and  lasting 15 to 20 minutes of a 25 minute visit on the following issues:   Reviewed results, no evidence of RA lung dz or mtx adverse effects >f/u for worse sob or changes on cxr or at discretion of Rheumatology

## 2017-02-17 ENCOUNTER — Other Ambulatory Visit: Payer: Self-pay | Admitting: Gastroenterology

## 2017-02-17 ENCOUNTER — Ambulatory Visit
Admission: RE | Admit: 2017-02-17 | Discharge: 2017-02-17 | Disposition: A | Discharge status: Home / Self Care | Payer: Medicare Other | Source: Ambulatory Visit | Attending: Gastroenterology | Admitting: Gastroenterology

## 2017-02-17 DIAGNOSIS — R05 Cough: Secondary | ICD-10-CM

## 2017-02-17 DIAGNOSIS — R059 Cough, unspecified: Secondary | ICD-10-CM

## 2017-04-05 ENCOUNTER — Ambulatory Visit: Payer: Medicare Other | Admitting: Neurology

## 2017-04-28 ENCOUNTER — Other Ambulatory Visit: Payer: Self-pay | Admitting: Family Medicine

## 2017-04-28 DIAGNOSIS — Z1231 Encounter for screening mammogram for malignant neoplasm of breast: Secondary | ICD-10-CM

## 2017-05-03 ENCOUNTER — Encounter: Payer: Self-pay | Admitting: Adult Health

## 2017-05-03 ENCOUNTER — Ambulatory Visit: Payer: Medicare Other | Admitting: Adult Health

## 2017-05-03 DIAGNOSIS — R05 Cough: Secondary | ICD-10-CM

## 2017-05-03 DIAGNOSIS — R058 Other specified cough: Secondary | ICD-10-CM

## 2017-05-03 MED ORDER — PREDNISONE 10 MG PO TABS: ORAL_TABLET | ORAL | 0 refills | Status: DC

## 2017-05-03 MED ORDER — CETIRIZINE HCL 10 MG PO TABS: 10.0000 mg | ORAL_TABLET | Freq: Every day | ORAL | 3 refills | Status: DC

## 2017-05-03 MED ORDER — BENZONATATE 200 MG PO CAPS: 200.0000 mg | ORAL_CAPSULE | Freq: Three times a day (TID) | ORAL | 3 refills | Status: DC | PRN

## 2017-05-03 MED ORDER — FLUTICASONE PROPIONATE 50 MCG/ACT NA SUSP: 2.0000 | Freq: Every day | NASAL | 3 refills | Status: DC

## 2017-05-03 MED ORDER — DEXTROMETHORPHAN POLISTIREX ER 30 MG/5ML PO SUER: ORAL | 3 refills | Status: DC

## 2017-05-03 NOTE — Progress Notes (Signed)
@Patient  ID: Lorraine Gilbert, female    DOB: [birth year], 84 y.o.   MRN: [id number]  Chief Complaint  Patient presents with  . Acute Visit    Referring provider: [name], MD  HPI: 84 yo female from [place] , never smoker , followed for chronic cough /cough variant asthma  PMH : RA on Prednisone 5mg  (off Methotrexate 2018 )    TEST  - PFTs wnl 12/04/13 > ok to try off dulera - flare off dulera 08/14/14 > ok to resume dulera 100 2bid  - Spirometry 04/24/2015  wnl off dulera / even fef 25-75 - NO 04/24/2015  = 17  - PFT's  01/06/2017  FEV1 1.59 (101 % ) ratio 86  p 4 % improvement from saba p symb 80 x 2 x 12h  prior to study with DLCO  67/68c % corrects to 100 % for alv volume     05/03/2017 Acute OV : Cough  Pt presents for an acute office visit. Complains of increased dry cough , nasal drainage , throat clearing /tickle in throat . Has tried things in past for cough but not taking anything now .  Was on Symbicort previously but did not help , stopped . No worse off Symbicort , also could not afford. Last visit was started on pepcid and chlortrimeton but did not see much help. Cough has been worse for last 2-3 weeks.  CXR in Jan 2019 showed clear lungs.   She has Rheumatoid Arthritis on prednisone 5mg  . Previously on MTX.   Allergies  Allergen Reactions  . Motrin [Ibuprofen]   . Morphine Rash    Patient says it makes her feel like she is flying.     Immunization History  Administered Date(s) Administered  . Influenza Split 09/18/2013, 10/19/2014, 09/18/2016  . Influenza Whole 10/19/2011, 09/19/2015  . Influenza-Unspecified 10/19/2014    Past Medical History:  Diagnosis Date  . Abnormal heart rhythm   . Allergic rhinitis   . Anxiety   . Diabetes mellitus without complication ([place])   . GERD (gastroesophageal reflux disease)   . Headache   . Hypertension   . Osteopenia   . Rheumatoid arthritis ([place])   . Vertigo     Tobacco History: Social History   Tobacco  Use  Smoking Status Never Smoker  Smokeless Tobacco Never Used   Counseling given: Not Answered   Outpatient Encounter Medications as of 05/03/2017  Medication Sig  . apixaban (ELIQUIS) 5 MG TABS tablet Take 5 mg by mouth 2 (two) times daily.  Marland Kitchen aspirin 81 MG tablet Take 81 mg by mouth every morning.   . benzonatate (TESSALON) 100 MG capsule Take 100 mg by mouth 3 (three) times daily as needed for cough.  . calcium carbonate (OS-CAL) 600 MG tablet Take 600 mg by mouth every morning.   . digoxin (LANOXIN) 0.125 MG tablet Take 125 mcg by mouth every morning.   . diltiazem (CARDIZEM CD) 240 MG 24 hr capsule Take 240 mg by mouth daily.  Marland Kitchen donepezil (ARICEPT) 10 MG tablet Take 1/2 tablet daily for 1 month, then increase to 1 tablet daily  . escitalopram (LEXAPRO) 10 MG tablet Take 10 mg by mouth every morning.   . folic acid (FOLVITE) 1 MG tablet Take 1 mg by mouth every morning.   . pantoprazole (PROTONIX) 20 MG tablet Take 20 mg by mouth daily before breakfast.   . predniSONE (DELTASONE) 5 MG tablet Take 10 mg by mouth daily with breakfast. For 30  days per Dr Trudie Reed beginning 04/01/17  . vitamin C (ASCORBIC ACID) 500 MG tablet Take 500 mg by mouth daily.  . Vitamin D, Ergocalciferol, (DRISDOL) 50000 units CAPS capsule Take 50,000 Units by mouth every 7 (seven) days.  . budesonide-formoterol (SYMBICORT) 80-4.5 MCG/ACT inhaler Inhale 2 puffs into the lungs 2 (two) times daily. (Patient not taking: Reported on 05/03/2017)  . methotrexate (RHEUMATREX) 2.5 MG tablet 4 tablets once per wk per Dr. Trudie Reed   No facility-administered encounter medications on file as of 05/03/2017.      Review of Systems  Constitutional:   No  weight loss, night sweats,  Fevers, chills, fatigue, or  lassitude.  HEENT:   No headaches,  Difficulty swallowing,  Tooth/dental problems, or  Sore throat,                No sneezing, itching, ear ache,  +nasal congestion, post nasal drip,   CV:  No chest pain,  Orthopnea,  PND, swelling in lower extremities, anasarca, dizziness, palpitations, syncope.   GI  No heartburn, indigestion, abdominal pain, nausea, vomiting, diarrhea, change in bowel habits, loss of appetite, bloody stools.   Resp:  No chest wall deformity  Skin: no rash or lesions.  GU: no dysuria, change in color of urine, no urgency or frequency.  No flank pain, no hematuria   MS:  No joint pain or swelling.  No decreased range of motion.  No back pain.    Physical Exam  BP (!) 138/58 (BP Location: Left Arm, Cuff Size: Normal)   Pulse (!) 52   Ht 5\' 2"  (1.575 m)   Wt 131 lb (59.4 kg)   SpO2 98%   BMI 23.96 kg/m   GEN: A/Ox3; pleasant , NAD    HEENT:  [place]/AT,  EACs-clear, TMs-wnl, NOSE-clear drainage , THROAT-clear, no lesions, no postnasal drip or exudate noted.   NECK:  Supple w/ fair ROM; no JVD; normal carotid impulses w/o bruits; no thyromegaly or nodules palpated; no lymphadenopathy.    RESP  Clear  P & A; w/o, wheezes/ rales/ or rhonchi. no accessory muscle use, no dullness to percussion  CARD:  RRR, no m/r/g, no peripheral edema, pulses intact, no cyanosis or clubbing.  GI:   Soft & nt; nml bowel sounds; no organomegaly or masses detected.   Musco: Warm bil, no deformities or joint swelling noted.   Neuro: alert, no focal deficits noted.    Skin: Warm, no lesions or rashes    Lab Results:  CBC No results found for: WBC, RBC, HGB, HCT, PLT, MCV, MCH, MCHC, RDW, LYMPHSABS, MONOABS, EOSABS, BASOSABS  BMET No results found for: NA, K, CL, CO2, GLUCOSE, BUN, CREATININE, CALCIUM, GFRNONAA, GFRAA  BNP No results found for: BNP  ProBNP No results found for: PROBNP  Imaging: No results found.   Assessment & Plan:   No problem-specific Assessment & Plan notes found for this encounter.     Rexene Edison, NP 05/03/2017

## 2017-05-03 NOTE — Patient Instructions (Signed)
Begin Delsym cough syrup over-the-counter 2 teaspoons twice daily for cough Begin Zyrtec 10 mg at bedtime for sinus congestion and drainage Begin Tessalon 200 mg 3 times daily for cough as needed Saline nasal rinses twice daily for nasal congestion Begin Flonase 2 puffs daily, is over-the-counter Prednisone taper over the next week then resume 5 mg daily dosing Follow up with Dr. Melvyn Novas  In 6 weeks and As needed

## 2017-05-04 NOTE — Assessment & Plan Note (Signed)
Flare , with acute rhinitis   Plan  Patient Instructions  Begin Delsym cough syrup over-the-counter 2 teaspoons twice daily for cough Begin Zyrtec 10 mg at bedtime for sinus congestion and drainage Begin Tessalon 200 mg 3 times daily for cough as needed Saline nasal rinses twice daily for nasal congestion Begin Flonase 2 puffs daily, is over-the-counter Prednisone taper over the next week then resume 5 mg daily dosing Follow up with Dr. Melvyn Novas  In 6 weeks and As needed

## 2017-05-12 ENCOUNTER — Other Ambulatory Visit: Payer: Self-pay | Admitting: Physician Assistant

## 2017-05-12 DIAGNOSIS — M8580 Other specified disorders of bone density and structure, unspecified site: Secondary | ICD-10-CM

## 2017-05-26 ENCOUNTER — Ambulatory Visit: Payer: Medicare Other

## 2017-06-16 ENCOUNTER — Ambulatory Visit: Payer: Medicare Other | Admitting: Internal Medicine

## 2017-06-21 ENCOUNTER — Ambulatory Visit
Admission: RE | Admit: 2017-06-21 | Discharge: 2017-06-21 | Disposition: A | Discharge status: Home / Self Care | Payer: Medicare Other | Source: Ambulatory Visit | Attending: Family Medicine | Admitting: Family Medicine

## 2017-06-21 ENCOUNTER — Ambulatory Visit
Admission: RE | Admit: 2017-06-21 | Discharge: 2017-06-21 | Disposition: A | Discharge status: Home / Self Care | Payer: Medicare Other | Source: Ambulatory Visit | Attending: Physician Assistant | Admitting: Physician Assistant

## 2017-06-21 DIAGNOSIS — M8580 Other specified disorders of bone density and structure, unspecified site: Secondary | ICD-10-CM

## 2017-06-21 DIAGNOSIS — Z1231 Encounter for screening mammogram for malignant neoplasm of breast: Secondary | ICD-10-CM

## 2017-06-28 ENCOUNTER — Other Ambulatory Visit: Payer: Self-pay

## 2017-06-28 ENCOUNTER — Encounter: Payer: Self-pay | Admitting: Neurology

## 2017-06-28 ENCOUNTER — Ambulatory Visit: Payer: Medicare Other | Admitting: Neurology

## 2017-06-28 VITALS — BP 146/58 | HR 70 | Ht 63.0 in | Wt 129.0 lb

## 2017-06-28 DIAGNOSIS — R413 Other amnesia: Secondary | ICD-10-CM

## 2017-06-28 DIAGNOSIS — R531 Weakness: Secondary | ICD-10-CM | POA: Diagnosis not present

## 2017-06-28 DIAGNOSIS — R200 Anesthesia of skin: Secondary | ICD-10-CM | POA: Diagnosis not present

## 2017-06-28 NOTE — Patient Instructions (Addendum)
1. Schedule EMG/NCV of right UE and LE with Dr. Posey Pronto 2. After test, we will plan for PT of right leg 3. MRI results will be requested from Dr. Nelva Bush for review 4. Stop Donepezil 5. Follow-up in 6 months, call for any changes   RECOMMENDATIONS FOR ALL PATIENTS WITH MEMORY PROBLEMS: 1. Continue to exercise (Recommend 30 minutes of walking everyday, or 3 hours every week) 2. Increase social interactions - continue going to [place] and enjoy social gatherings with friends and family 3. Eat healthy, avoid fried foods and eat more fruits and vegetables 4. Maintain adequate blood pressure, blood sugar, and blood cholesterol level. Reducing the risk of stroke and cardiovascular disease also helps promoting better memory. 5. Avoid stressful situations. Live a simple life and avoid aggravations. Organize your time and prepare for the next day in anticipation. 6. Sleep well, avoid any interruptions of sleep and avoid any distractions in the bedroom that may interfere with adequate sleep quality 7. Avoid sugar, avoid sweets as there is a strong link between excessive sugar intake, diabetes, and cognitive impairment The Mediterranean diet has been shown to help patients reduce the risk of progressive memory disorders and reduces cardiovascular risk. This includes eating fish, eat fruits and green leafy vegetables, nuts like almonds and hazelnuts, walnuts, and also use olive oil. Avoid fast foods and fried foods as much as possible. Avoid sweets and sugar as sugar use has been linked to worsening of memory function.

## 2017-06-28 NOTE — Progress Notes (Signed)
NEUROLOGY FOLLOW UP OFFICE NOTE  Lorraine Gilbert [id number]  DOB: 26-Sep-1932  HISTORY OF PRESENT ILLNESS: I had the pleasure of seeing Lorraine Gilbert in follow-up in the neurology clinic on 06/28/2017. She is accompanied by her daughter-in-law who helps supplement the history today. The patient was last seen 9 months ago for worsening memory. She presents today with new symptoms of right arm and leg weakness and numbness. She reports a chronic history of right shoulder pain and had previously been offered surgery, but symptoms improved with PT. She started having more right shoulder pain a few weeks ago and went to the ER for right shoulder pain and frequent falls. She has pain on her right arm, sometimes she feels she cannot control it and it jerks and twitches involuntarily. She had an MRI of the shoulder and rotator cuff surgery has been discussed. She was also having issues with her right leg, she reports numbness from her hip down to the foot. She feels it is weak, she drags her right leg sometimes and states sometimes she can hardly walk. She has some back pain and received an epidural injection recently. She feels like there is a needle going down her leg sometimes, but her main concern is that it is numb. No bowel/bladder dysfunction, no perineal numbness. Left side is unaffected. She reports having an MRI lumbar spine with Dr. Nelva Bush, results unavailable for review. She had an MRI brain on 06/16/17 which did not show any acute changes, there was a small old lacunar infarct in the right cerebellum, mild chronic microvascular disease. MRI cervical spine showed cervical spondylosis most pronounced at C3-4 through C5-6, no cord compression or abnormal cord signal seen although limited by motion. No significant spinal canal stenosis. There was moderate left foraminal stenosis at C3-C4, moderate bilateral foraminal stenosis at C4-C5, and moderate right foraminal stenosis at C5-C6.   Her daughter-in-law  is concerned that Donepezil had worsened her own mother's symptoms, and would like to discontinue this in Lorraine Gilbert. Family feels she is occasionally forgetful but able to do complex tasks. She feels her memory is the same. She lives with her husband and son/daughter-in-law. She has not been cooking much due to her right arm difficulties. She has not been driving due to right leg issues as well. She denies missing medications or bill payments.   HPI 08/20/14: This is a pleasant 84 yo RH woman with a history of atrial fibrillation on anticoagulation with Eliquis, hypertension, hyperlipidemia, diabetes, anxiety, vitamin D deficiency, polymyalgia rheumatica, who presented for evaluation of worsening memory. She started noticing symptoms over the past few months, mostly with short-term memory, she would forget things, occasionally forget her medications, or get disoriented when driving in unfamiliar places. She has noticed some word-finding difficulties. There are times she would "feel lost" when in crowded places like the mall last week. She denies any missed bill payments, she cooks and drives without difficulties, no difficulties with ADLs.  She was found to have atrial fibrillation after episodes of syncope 2-3 years ago. She has had brief sharp pains in the occipital region lasting a few seconds occurring around once a week, with associated photophobia, no nausea/vomiting. She has occasional dizziness. She denies any vision changes except for blurred vision from cataracts and glaucoma. She has chronic neck and back pain. She has numbness and tingling in both hands, and occasional pain in her calves. She denies any bowel/bladder dysfunction, no anosmia or tremors. She reports her mother had "the beginning of  Alzheimer's" at age 63. She had a head injury when younger, hit her head and was unconscious until she woke up in the hospital. She denies any alcohol intake.  Records and images were personally reviewed  where available.  I personally reviewed MRI brain without contrast which did not show any acute changes. There was mild diffuse atrophy and mild chronic microvascular disease. TSH and B12 normal.   PAST MEDICAL HISTORY: Past Medical History:  Diagnosis Date  . Abnormal heart rhythm   . Allergic rhinitis   . Anxiety   . Diabetes mellitus without complication ([place])   . GERD (gastroesophageal reflux disease)   . Headache   . Hypertension   . Osteopenia   . Rheumatoid arthritis ([place])   . Vertigo     MEDICATIONS: Current Outpatient Medications on File Prior to Visit  Medication Sig Dispense Refill  . apixaban (ELIQUIS) 5 MG TABS tablet Take 5 mg by mouth 2 (two) times daily.    Marland Kitchen aspirin 81 MG tablet Take 81 mg by mouth every morning.     . benzonatate (TESSALON) 200 MG capsule Take 1 capsule (200 mg total) by mouth 3 (three) times daily as needed for cough. 45 capsule 3  . calcium carbonate (OS-CAL) 600 MG tablet Take 600 mg by mouth every morning.     . cetirizine (ZYRTEC ALLERGY) 10 MG tablet Take 1 tablet (10 mg total) by mouth at bedtime. 30 tablet 3  . dextromethorphan (DELSYM) 30 MG/5ML liquid 2 teaspoons by mouth twice daily as needed for cough 148 mL 3  . digoxin (LANOXIN) 0.125 MG tablet Take 125 mcg by mouth every morning.     . diltiazem (CARDIZEM CD) 240 MG 24 hr capsule Take 240 mg by mouth daily.    Marland Kitchen donepezil (ARICEPT) 10 MG tablet Take 1/2 tablet daily for 1 month, then increase to 1 tablet daily 30 tablet 11  . escitalopram (LEXAPRO) 10 MG tablet Take 10 mg by mouth every morning.     . fluticasone (FLONASE) 50 MCG/ACT nasal spray Place 2 sprays into both nostrils daily. 16 g 3  . folic acid (FOLVITE) 1 MG tablet Take 1 mg by mouth every morning.     . pantoprazole (PROTONIX) 20 MG tablet Take 20 mg by mouth daily before breakfast.     . predniSONE (DELTASONE) 10 MG tablet 4 tabs for 2 days, then 3 tabs for 2 days, 2 tabs for 2 days, then 1 tab for 2 days, then 5mg   daily 20 tablet 0  . predniSONE (DELTASONE) 5 MG tablet Take 10 mg by mouth daily with breakfast. For 30 days per Dr Trudie Reed beginning 04/01/17    . vitamin C (ASCORBIC ACID) 500 MG tablet Take 500 mg by mouth daily.    . Vitamin D, Ergocalciferol, (DRISDOL) 50000 units CAPS capsule Take 50,000 Units by mouth every 7 (seven) days.     No current facility-administered medications on file prior to visit.     ALLERGIES: Allergies  Allergen Reactions  . Motrin [Ibuprofen]   . Morphine Rash    Patient says it makes her feel like she is flying.     FAMILY HISTORY: Family History  Problem Relation Age of Onset  . Emphysema Father        smoked  . Heart disease Mother   . Breast cancer Neg Hx     SOCIAL HISTORY: Social History   Socioeconomic History  . Marital status: Married    Spouse name: Not on  file  . Number of children: 6  . Years of education: Not on file  . Highest education level: Not on file  Occupational History  . Occupation: Retired  Scientific laboratory technician  . Financial resource strain: Not on file  . Food insecurity:    Worry: Not on file    Inability: Not on file  . Transportation needs:    Medical: Not on file    Non-medical: Not on file  Tobacco Use  . Smoking status: Never Smoker  . Smokeless tobacco: Never Used  Substance and Sexual Activity  . Alcohol use: No    Alcohol/week: 0.0 oz  . Drug use: No  . Sexual activity: Not on file  Lifestyle  . Physical activity:    Days per week: Not on file    Minutes per session: Not on file  . Stress: Not on file  Relationships  . Social connections:    Talks on phone: Not on file    Gets together: Not on file    Attends religious service: Not on file    Active member of club or organization: Not on file    Attends meetings of clubs or organizations: Not on file    Relationship status: Not on file  . Intimate partner violence:    Fear of current or ex partner: Not on file    Emotionally abused: Not on file     Physically abused: Not on file    Forced sexual activity: Not on file  Other Topics Concern  . Not on file  Social History Narrative  . Not on file    REVIEW OF SYSTEMS: Constitutional: No fevers, chills, or sweats, no generalized fatigue, change in appetite Eyes: No visual changes, double vision, eye pain Ear, nose and throat: No hearing loss, ear pain, nasal congestion, sore throat Cardiovascular: No chest pain, palpitations Respiratory:  No shortness of breath at rest or with exertion, wheezes GastrointestinaI: No nausea, vomiting, diarrhea, abdominal pain, fecal incontinence Genitourinary:  No dysuria, urinary retention or frequency Musculoskeletal:  + neck pain, back pain Integumentary: No rash, pruritus, skin lesions Neurological: as above Psychiatric: No depression, insomnia, anxiety Endocrine: No palpitations, fatigue, diaphoresis, mood swings, change in appetite, change in weight, increased thirst Hematologic/Lymphatic:  No anemia, purpura, petechiae. Allergic/Immunologic: no itchy/runny eyes, nasal congestion, recent allergic reactions, rashes  PHYSICAL EXAM: Vitals:   06/28/17 1109  BP: (!) 146/58  Pulse: 70  SpO2: 98%   General: No acute distress Head:  Normocephalic/atraumatic Neck: supple, no paraspinal tenderness, full range of motion Heart:  Regular rate and rhythm Lungs:  Clear to auscultation bilaterally Back: No paraspinal tenderness Skin/Extremities: No rash, no edema Neurological Exam: alert and oriented to person, place, and time. No aphasia or dysarthria. Fund of knowledge is appropriate.  Recent and remote memory are intact.  Attention and concentration are normal.    Able to name objects and repeat phrases. CDT 5/5.  MMSE - Mini Mental State Exam 06/28/2017 10/05/2016 11/18/2015  Orientation to time 5 4 5   Orientation to Place 5 5 5   Registration 3 3 3   Attention/ Calculation 5 5 5   Recall 3 2 2   Language- name 2 objects 2 2 2   Language- repeat 1 1  1   Language- follow 3 step command 2 3 3   Language- read & follow direction 1 1 1   Write a sentence 1 1 1   Copy design 1 1 1   Total score 29 28 29    Cranial nerves: Pupils equal, round,  reactive to light. Extraocular movements intact with no nystagmus. Visual fields full. Facial sensation intact. No facial asymmetry. Tongue, uvula, palate midline.  Motor: Bulk and tone normal, muscle strength 5/5 on individual muscle testing, with report of pain on right shoulder abduction. No pronator drift.  Sensation intact to cold, pin on both UE, decreased cold and pin on right LE, decreased vibration to knees bilaterally, worse on right LE. Deep tendon reflexes +2 on both UE, bilateral patella, +1 both ankle jerks. Toes downgoing.  Finger to nose testing intact.  Gait slow and cautious favoring right leg, unable to tandem walk.  Romberg negative.  IMPRESSION: This is a pleasant 84 yo RH woman with vascular risk factors including hypertension, hyperlipidemia, diabetes, atrial fibrillation on Eliquis, PMR, who I had been seeing for memory changes. She reported continued worsening on last visit and was started on Donepezil, family today expresses concern that it has worsened symptoms in other patients and does not feel her memory is concerning, we have agreed to stop Donepezil. MMSE today 29/30. She presents today with new symptoms of right arm and leg weakness, right leg numbness. MRI brain and cervical spine did not show any intracranial abnormality, no significant spinal stenosis or cord abnormality. There were several levels of foraminal stenoses. She reports having an MRI lumbar spine, report will be requested for review. Strength is intact on individual muscle testing, she mostly feels numbness in her right leg. She is also dealing with right rotator cuff issues with pain. We discussed doing an EMG/NCV of the right UE and LE to further evaluate her symptoms. We discussed doing PT to help with symptoms, she has  been told to hold off on right UE PT until after surgery, we may do right leg PT first after EMG is done. She will follow-up in 6 months and knows to call for any changes.   Thank you for allowing me to participate in her care.  Please do not hesitate to call for any questions or concerns.  The duration of this appointment visit was 30 minutes of face-to-face time with the patient.  Greater than 50% of this time was spent in counseling, explanation of diagnosis, planning of further management, and coordination of care.   Ellouise Newer, M.D.   CC: Dr. Moreen Fowler

## 2017-07-07 ENCOUNTER — Ambulatory Visit (INDEPENDENT_AMBULATORY_CARE_PROVIDER_SITE_OTHER): Payer: Medicare Other | Admitting: Neurology

## 2017-07-07 DIAGNOSIS — R200 Anesthesia of skin: Secondary | ICD-10-CM

## 2017-07-07 DIAGNOSIS — R202 Paresthesia of skin: Secondary | ICD-10-CM | POA: Diagnosis not present

## 2017-07-07 DIAGNOSIS — G5621 Lesion of ulnar nerve, right upper limb: Secondary | ICD-10-CM | POA: Diagnosis not present

## 2017-07-07 DIAGNOSIS — R531 Weakness: Secondary | ICD-10-CM | POA: Diagnosis not present

## 2017-07-07 NOTE — Procedures (Signed)
[facility]  [place], [address]  [place], [place] [address] Tel: [phone] Fax:  [phone] Test Date:  07/07/2017  Patient: Lorraine Gilbert DOB: [birth year] Physician: Narda Amber, DO  Sex: Female Height: 5\' 3"  Ref Phys: Ellouise Newer, M.D.  ID#: [id number] Temp: 35.8C Technician:    Patient Complaints: This is a 84 year-old female referred for evaluation of right sided weakness and right leg numbness.  NCV & EMG Findings: Extensive electrodiagnostic testing of the right upper and lower extremity shows:  1. Right ulnar sensory response shows prolonged latency (4.1 ms). Right median, mixed palmer, sural, and superficial peroneal sensory responses are within normal limits. 2. Right ulnar motor response shows slowed conduction velocity across the elbow (A Elbow-B Elbow, 43 m/s).  Right median, peroneal, and tibial motor responses are within normal limits.  3. Right tibial H reflex study is within normal limits. 4. Sparse chronic motor axon loss changes isolated to the right first dorsal interosseous muscle, without accompanied active denervation. Motor unit configuration and recruitment pattern in the remaining muscles are within normal limits.  Impression: 1. Right ulnar neuropathy with slowing across the elbow, purely demyelinating in type, and mild in degree electrically. 2. There is no evidence of a diffuse myopathy, large fiber sensorimotor polyneuropathy, or cervical/lumbosacral radiculopathy affecting the right side.   ___________________________ Narda Amber, DO    Nerve Conduction Studies Anti Sensory Summary Table   Stim Site NR Peak (ms) Norm Peak (ms) P-T Amp (V) Norm P-T Amp  Right Median Anti Sensory (2nd Digit)  35.8C  Wrist    3.5 <3.8 11.2 >10  Right Sup Peroneal Anti Sensory (Ant Lat Mall)  35.8C  12 cm    2.5 <4.6 8.4 >3  Right Sural Anti Sensory (Lat Mall)  35.8C  Calf    3.5 <4.6 8.0 >3  Right Ulnar Anti Sensory (5th Digit)  35.8C    Wrist    4.1 <3.2 13.2 >5   Motor Summary Table   Stim Site NR Onset (ms) Norm Onset (ms) O-P Amp (mV) Norm O-P Amp Site1 Site2 Delta-0 (ms) Dist (cm) Vel (m/s) Norm Vel (m/s)  Right Median Motor (Abd Poll Brev)  35.8C  Wrist    3.2 <4.0 7.8 >5 Elbow Wrist 4.6 27.0 59 >50  Elbow    7.8  7.2         Right Peroneal Motor (Ext Dig Brev)  35.8C  Ankle    3.7 <6.0 3.4 >2.5 B Fib Ankle 7.1 34.0 48 >40  B Fib    10.8  3.1  Poplt B Fib 2.0 8.0 40 >40  Poplt    12.8  3.0         Right Tibial Motor (Abd Hall Brev)  35.8C  Ankle    5.2 <6.0 6.5 >4 Knee Ankle 7.7 39.0 51 >40  Knee    12.9  5.6         Right Ulnar Motor (Abd Dig Minimi)  35.8C  Wrist    2.7 <3.1 10.1 >7 B Elbow Wrist 3.0 21.0 70 >50  B Elbow    5.7  9.2  A Elbow B Elbow 2.3 10.0 43 >50  A Elbow    8.0  8.4          Comparison Summary Table   Stim Site NR Peak (ms) Norm Peak (ms) P-T Amp (V) Site1 Site2 Delta-P (ms) Norm Delta (ms)  Right Median/Ulnar Palm Comparison (Wrist - 8cm)  35.8C  Median TransMontaigne  2.0 <2.2 18.5 Median Palm Ulnar Palm 0.3   Ulnar Palm    1.7 <2.2 10.3       H Reflex Studies   NR H-Lat (ms) Lat Norm (ms) L-R H-Lat (ms)  Right Tibial (Gastroc)  35.8C     34.56 <35    EMG   Side Muscle Ins Act Fibs Psw Fasc Number Recrt Dur Dur. Amp Amp. Poly Poly. Comment  Right AntTibialis Nml Nml Nml Nml Nml Nml Nml Nml Nml Nml Nml Nml N/A  Right Gastroc Nml Nml Nml Nml Nml Nml Nml Nml Nml Nml Nml Nml N/A  Right RectFemoris Nml Nml Nml Nml Nml Nml Nml Nml Nml Nml Nml Nml N/A  Right BicepsFemS Nml Nml Nml Nml Nml Nml Nml Nml Nml Nml Nml Nml N/A  Right 1stDorInt Nml Nml Nml Nml 1- Rapid Few 1+ Few 1+ Nml Nml N/A  Right ABD Dig Min Nml Nml Nml Nml Nml Nml Nml Nml Nml Nml Nml Nml N/A  Right FlexCarpiUln Nml Nml Nml Nml Nml Nml Nml Nml Nml Nml Nml Nml N/A  Right PronatorTeres Nml Nml Nml Nml Nml Nml Nml Nml Nml Nml Nml Nml N/A  Right Biceps Nml Nml Nml Nml Nml Nml Nml Nml Nml Nml Nml Nml N/A  Right Triceps Nml  Nml Nml Nml Nml Nml Nml Nml Nml Nml Nml Nml N/A  Right Deltoid Nml Nml Nml Nml Nml Nml Nml Nml Nml Nml Nml Nml N/A      Waveforms:

## 2017-07-07 NOTE — Progress Notes (Signed)
12  

## 2017-07-12 ENCOUNTER — Telehealth: Payer: Self-pay

## 2017-07-12 NOTE — Telephone Encounter (Signed)
-----   Message from Cameron Sprang, MD sent at 07/12/2017  9:43 AM EDT ----- Pls let patient know that the nerve and muscle test did not show any evidence of significant injury to her nerves in the arm and leg. There is a mild pinched nerve at the elbow, but the problem with her arm is likely due to her shoulder. The problem with her leg is likely due to her back. Pls request MRI lumbar spine results from her Ortho Dr. Nelva Bush. Would start PT of right leg, or if she wants to wait after shoulder surgery. Thanks!

## 2017-07-12 NOTE — Telephone Encounter (Signed)
Spoke with pt relaying message below.  Pt states that she would like to wait until after her shoulder Sx for PT.  Advised her to give me a call when she is ready

## 2017-07-15 ENCOUNTER — Ambulatory Visit: Payer: Medicare Other | Admitting: Neurology

## 2017-07-26 ENCOUNTER — Telehealth: Payer: Self-pay | Admitting: Neurology

## 2017-07-26 NOTE — Telephone Encounter (Signed)
Patient states she is having pain in her limbs and discoloration and she would like to speak with the nurse.

## 2017-07-27 NOTE — Pre-Procedure Instructions (Signed)
Niang Mitcheltree  07/27/2017      [facility] [place], [address] [place] [address] Phone: [phone] Fax: [phone]    Your procedure is scheduled on August 04, 2017.  Report to [facility] Admitting at 530 AM.  Call this number if you have problems the morning of surgery:  [phone]   Remember:  Do not eat or drink after midnight.    Take these medicines the morning of surgery with A SIP OF WATER  Digoxin (lanoxin) Diltiazem (cardizem) Eye drops escitalopram (lexapro) Pantoprazole (protonix)  Follow you surgeon's instructions on when to hold/resume Eliquis and aspirin 81 mg.  If no instructions given, call your surgeon's office to receive instructions  7 days prior to surgery STOP taking any Aleve, Naproxen, Ibuprofen, Motrin, Advil, Goody's, BC's, all herbal medications, fish oil, and all vitamins    Do not wear jewelry, make-up or nail polish.  Do not wear lotions, powders, or perfumes, or deodorant.  Do not shave 48 hours prior to surgery.    Do not bring valuables to the hospital.  [facility] is not responsible for any belongings or valuables.  Contacts, dentures or bridgework may not be worn into surgery.  Leave your suitcase in the car.  After surgery it may be brought to your room.  For patients admitted to the hospital, discharge time will be determined by your treatment team.  Patients discharged the day of surgery will not be allowed to drive home.   [place]- Preparing For Surgery  Before surgery, you can play an important role. Because skin is not sterile, your skin needs to be as free of germs as possible. You can reduce the number of germs on your skin by washing with CHG (chlorahexidine gluconate) Soap before surgery.  CHG is an antiseptic cleaner which kills germs and bonds with the skin to continue killing germs even after washing.    Oral Hygiene is also important to  reduce your risk of infection.  Remember - BRUSH YOUR TEETH THE MORNING OF SURGERY WITH YOUR REGULAR TOOTHPASTE  Please do not use if you have an allergy to CHG or antibacterial soaps. If your skin becomes reddened/irritated stop using the CHG.  Do not shave (including legs and underarms) for at least 48 hours prior to first CHG shower. It is OK to shave your face.  Please follow these instructions carefully.   1. Shower the NIGHT BEFORE SURGERY and the MORNING OF SURGERY with CHG.   2. If you chose to wash your hair, wash your hair first as usual with your normal shampoo.  3. After you shampoo, rinse your hair and body thoroughly to remove the shampoo.  4. Use CHG as you would any other liquid soap. You can apply CHG directly to the skin and wash gently with a scrungie or a clean washcloth.   5. Apply the CHG Soap to your body ONLY FROM THE NECK DOWN.  Do not use on open wounds or open sores. Avoid contact with your eyes, ears, mouth and genitals (private parts). Wash Face and genitals (private parts)  with your normal soap.  6. Wash thoroughly, paying special attention to the area where your surgery will be performed.  7. Thoroughly rinse your body with warm water from the neck down.  8. DO NOT shower/wash with your normal soap after using and rinsing off the CHG Soap.  9. Pat yourself dry with a  CLEAN TOWEL.  10. Wear CLEAN PAJAMAS to bed the night before surgery, wear comfortable clothes the morning of surgery  11. Place CLEAN SHEETS on your bed the night of your first shower and DO NOT SLEEP WITH PETS.  Day of Surgery:  Do not apply any deodorants/lotions.  Please wear clean clothes to the hospital/surgery center.   Remember to brush your teeth WITH YOUR REGULAR TOOTHPASTE.  Please read over the following fact sheets that you were given. Pain Booklet, Coughing and Deep Breathing, MRSA Information and Surgical Site Infection Prevention

## 2017-07-28 ENCOUNTER — Encounter (HOSPITAL_COMMUNITY): Payer: Self-pay

## 2017-07-28 ENCOUNTER — Telehealth: Payer: Self-pay | Admitting: Neurology

## 2017-07-28 ENCOUNTER — Other Ambulatory Visit: Payer: Self-pay

## 2017-07-28 ENCOUNTER — Encounter (HOSPITAL_COMMUNITY)
Admission: RE | Admit: 2017-07-28 | Discharge: 2017-07-28 | Disposition: A | Discharge status: Home / Self Care | Payer: Medicare Other | Source: Ambulatory Visit | Attending: Orthopedic Surgery | Admitting: Orthopedic Surgery

## 2017-07-28 DIAGNOSIS — K219 Gastro-esophageal reflux disease without esophagitis: Secondary | ICD-10-CM | POA: Insufficient documentation

## 2017-07-28 DIAGNOSIS — I4891 Unspecified atrial fibrillation: Secondary | ICD-10-CM | POA: Insufficient documentation

## 2017-07-28 DIAGNOSIS — M75101 Unspecified rotator cuff tear or rupture of right shoulder, not specified as traumatic: Secondary | ICD-10-CM | POA: Diagnosis not present

## 2017-07-28 DIAGNOSIS — Z01818 Encounter for other preprocedural examination: Secondary | ICD-10-CM | POA: Insufficient documentation

## 2017-07-28 DIAGNOSIS — I10 Essential (primary) hypertension: Secondary | ICD-10-CM | POA: Diagnosis not present

## 2017-07-28 DIAGNOSIS — M12811 Other specific arthropathies, not elsewhere classified, right shoulder: Secondary | ICD-10-CM | POA: Insufficient documentation

## 2017-07-28 DIAGNOSIS — E119 Type 2 diabetes mellitus without complications: Secondary | ICD-10-CM | POA: Diagnosis not present

## 2017-07-28 DIAGNOSIS — Z79899 Other long term (current) drug therapy: Secondary | ICD-10-CM | POA: Insufficient documentation

## 2017-07-28 DIAGNOSIS — Z7901 Long term (current) use of anticoagulants: Secondary | ICD-10-CM | POA: Insufficient documentation

## 2017-07-28 DIAGNOSIS — Z7982 Long term (current) use of aspirin: Secondary | ICD-10-CM | POA: Diagnosis not present

## 2017-07-28 HISTORY — DX: Other specific arthropathies, not elsewhere classified, unspecified shoulder: M12.819

## 2017-07-28 HISTORY — DX: Unspecified rotator cuff tear or rupture of unspecified shoulder, not specified as traumatic: M75.100

## 2017-07-28 HISTORY — DX: Unspecified atrial fibrillation: I48.91

## 2017-07-28 HISTORY — DX: Unspecified asthma, uncomplicated: J45.909

## 2017-07-28 LAB — BASIC METABOLIC PANEL
ANION GAP: 8 (ref 5–15)
BUN: 15 mg/dL (ref 8–23)
CHLORIDE: 107 mmol/L (ref 98–111)
CO2: 24 mmol/L (ref 22–32)
Calcium: 9.3 mg/dL (ref 8.9–10.3)
Creatinine, Ser: 0.89 mg/dL (ref 0.44–1.00)
GFR calc Af Amer: >60 mL/min (ref >60)
GFR calc non Af Amer: 58 mL/min — ABNORMAL LOW (ref >60)
Glucose, Bld: 179 mg/dL — ABNORMAL HIGH (ref 70–99)
POTASSIUM: 4.8 mmol/L (ref 3.5–5.1)
SODIUM: 139 mmol/L (ref 135–145)

## 2017-07-28 LAB — GLUCOSE, CAPILLARY: GLUCOSE-CAPILLARY: 182 mg/dL — AB (ref 70–99)

## 2017-07-28 LAB — CBC
HEMATOCRIT: 43.6 % (ref 36.0–46.0)
HEMOGLOBIN: 13.6 g/dL (ref 12.0–15.0)
MCH: 29.5 pg (ref 26.0–34.0)
MCHC: 31.2 g/dL (ref 30.0–36.0)
MCV: 94.6 fL (ref 78.0–100.0)
Platelets: 256 10*3/uL (ref 150–400)
RBC: 4.61 MIL/uL (ref 3.87–5.11)
RDW: 13.5 % (ref 11.5–15.5)
WBC: 9.4 10*3/uL (ref 4.0–10.5)

## 2017-07-28 LAB — HEMOGLOBIN A1C
Hgb A1c MFr Bld: 7.8 % — ABNORMAL HIGH (ref 4.8–5.6)
Mean Plasma Glucose: 177.16 mg/dL

## 2017-07-28 LAB — SURGICAL PCR SCREEN
MRSA, PCR: NEGATIVE
Staphylococcus aureus: NEGATIVE

## 2017-07-28 NOTE — Progress Notes (Signed)
[facility]  Cardiologist - Dr. Michiel Sites [place]- [facility]   Chest x-ray - 06/19/17 (E)  EKG - 06/19/17 (CE)- Req'd  Stress Test - 01/28/15 (CE)- Req'd  ECHO - Denies  Cardiac Cath - Denies  Sleep Study - Denies CPAP - None  LABS- 07/28/17: CBC, BMP 08/04/17: PT  ASA- Continue Eliquis- LD-7/14  HA1C- 07/28/17 Fasting Blood Sugar - 99-120, Today 182 Checks Blood Sugar ____1_ time a day  Anesthesia- Yes- Req'd records  Pt denies having chest pain, sob, or fever at this time. All instructions explained to the pt, with a verbal understanding of the material. Pt agrees to go over the instructions while at home for a better understanding. The opportunity to ask questions was provided.

## 2017-07-28 NOTE — Progress Notes (Signed)
Lorraine Gilbert            07/27/2017                          [facility] [place], [place] [address] [place] [address] Phone: [phone] Fax: [phone]              Your procedure is scheduled on Thurs., August 04, 2017 from 7:30AM-9:30AM            Report to [facility] Admitting  Entrance "A" at 5:30AM            Call this number if you have problems the morning of surgery:            [phone]             Remember:            Do not eat or drink after midnight on July 17th                        Take these medicines the morning of surgery with A SIP OF WATER: Digoxin (lanoxin) Diltiazem (cardizem) Eye drops escitalopram (lexapro) Pantoprazole (protonix) Prednisone (deltasone)  Follow your doctors instructions regarding your Aspirin and Eliquis. Take last dose of Eliquis on 7/14.   As of today, STOP taking any Aleve, Naproxen, Ibuprofen, Motrin, Advil, Goody's, BC's, all herbal medications, fish oil, and all vitamins  How to Manage Your Diabetes Before and After Surgery  Why is it important to control my blood sugar before and after surgery? . Improving blood sugar levels before and after surgery helps healing and can limit problems. . A way of improving blood sugar control is eating a healthy diet by: o  Eating less sugar and carbohydrates o  Increasing activity/exercise o  Talking with your doctor about reaching your blood sugar goals . High blood sugars (greater than 180 mg/dL) can raise your risk of infections and slow your recovery, so you will need to focus on controlling your diabetes during the weeks before surgery. . Make sure that the doctor who takes care of your diabetes knows about your planned surgery including the date and location.  How do I manage my blood sugar before surgery? . Check your blood sugar at least 4 times a day, starting 2 days before surgery, to make sure that the  level is not too high or low. o Check your blood sugar the morning of your surgery when you wake up and every 2 hours until you get to the Short Stay unit. . If your blood sugar is less than 70 mg/dL, you will need to treat for low blood sugar: o Do not take insulin. o Treat a low blood sugar (less than 70 mg/dL) with  cup of clear juice (cranberry or apple), 4 glucose tablets, OR glucose gel. Recheck blood sugar in 15 minutes after treatment (to make sure it is greater than 70 mg/dL). If your blood sugar is not greater than 70 mg/dL on recheck, call [phone] o  for further instructions. . Report your blood sugar to the short stay nurse when you get to Short Stay.  . If you are admitted to the hospital after surgery: o Your blood sugar will be checked by the staff and you will probably be given insulin after surgery (instead of oral diabetes medicines) to make sure you have  good blood sugar levels. o The goal for blood sugar control after surgery is 80-180 mg/dL.  . If your CBG is greater than 220 mg/dL, inform the staff upon arrival to Short Stay.  Reviewed and Endorsed by [facility] Patient Education Committee, August 2015             Do not wear jewelry, make-up or nail polish.            Do not wear lotions, powders, or perfumes, or deodorant.            Do not shave 48 hours prior to surgery.              Do not bring valuables to the hospital.            [facility] is not responsible for any belongings or valuables.  Contacts, dentures or bridgework may not be worn into surgery.  Leave your suitcase in the car.  After surgery it may be brought to your room.  For patients admitted to the hospital, discharge time will be determined by your treatment team.  Patients discharged the day of surgery will not be allowed to drive home.   [facility]- Preparing For Surgery  Before surgery, you can play an important role. Because skin is not sterile, your skin needs to be as  free of germs as possible. You can reduce the number of germs on your skin by washing with CHG (chlorahexidine gluconate) Soap before surgery.  CHG is an antiseptic cleaner which kills germs and bonds with the skin to continue killing germs even after washing.    Oral Hygiene is also important to reduce your risk of infection.  Remember - BRUSH YOUR TEETH THE MORNING OF SURGERY WITH YOUR REGULAR TOOTHPASTE  Please do not use if you have an allergy to CHG or antibacterial soaps. If your skin becomes reddened/irritated stop using the CHG.  Do not shave (including legs and underarms) for at least 48 hours prior to first CHG shower. It is OK to shave your face.  Please follow these instructions carefully.                                                                                                                     1. Shower the NIGHT BEFORE SURGERY and the MORNING OF SURGERY with CHG.   2. If you chose to wash your hair, wash your hair first as usual with your normal shampoo.  3. After you shampoo, rinse your hair and body thoroughly to remove the shampoo.  4. Use CHG as you would any other liquid soap. You can apply CHG directly to the skin and wash gently with a scrungie or a clean washcloth.   5. Apply the CHG Soap to your body ONLY FROM THE NECK DOWN.  Do not use on open wounds or open sores. Avoid contact with your eyes, ears, mouth and genitals (private parts). Wash Face and genitals (private parts)  with your normal soap.  6. Wash thoroughly, paying special attention to the area where your surgery will be performed.  7. Thoroughly rinse your body with warm water from the neck down.  8. DO NOT shower/wash with your normal soap after using and rinsing off the CHG Soap.  9. Pat yourself dry with a CLEAN TOWEL.  10. Wear CLEAN PAJAMAS to bed the night before surgery, wear comfortable clothes the morning of surgery  11. Place CLEAN SHEETS on your bed the night of your  first shower and DO NOT SLEEP WITH PETS.  Day of Surgery:  Do not apply any deodorants/lotions.  Please wear clean clothes to the hospital/surgery center.   Remember to brush your teeth WITH YOUR REGULAR TOOTHPASTE.  Please read over the following fact sheets that you were given. Pain Booklet, Coughing and Deep Breathing, MRSA Information and Surgical Site Infection Prevention

## 2017-07-28 NOTE — Telephone Encounter (Signed)
Copy of MRI lumbar spine done 04/04/17 at [facility] report:  L1-2: Moderate to large right central canal and posterolateral extrusion L3-4: moderate to severe canal stenosis L4-5: moderate right and mild left lateral recess stenosis L5-S1: mild bilateral lateral recess stenosis

## 2017-07-29 NOTE — Progress Notes (Signed)
Anesthesia Chart Review:  Case:  [id number] Date/Time:  08/04/17 0715   Procedure:  RIGHT REVERSE SHOULDER ARTHROPLASTY (Right Shoulder)   Anesthesia type:  General   Pre-op diagnosis:  right shoulder rotator cuff tear arthropathy   Location:  [facility] OR ROOM 06 / [facility]   Surgeon:  Justice Britain, MD      DISCUSSION: 84 yo never smoker scheduled for above procedure. Pertinent medical hx includes RA, Aortic regurg, GERD, HTN, Anxiety, Afib, DMII, chronic cough, PAD with significant distal SFA disease.  Cardiology clearance per Elesa Massed, "The patient is anticipating shoulder surgery later this month. She has historically good exercise capacity and can perform greater than 4 METS of activities without limitations. She is felt to be at acceptable risk for her upcoming shoulder surgery. She should hold her Eliquis 2 to 3 days prior to surgery but continue this back as soon as possible post operative. Caution should be taken with her volume status during surgery. She has a mild increase in shortness of breath and occasional lower extremity edema concerns here today in the clinic. She does have a prescription for Lasix 20 mg to take as needed but has not been doing this. I encouraged her to go ahead and use Lasix on as-needed basis for volume retention. I will have the patient follow-up with her primary cardiologist in 4 to 6 weeks after the shoulder surgery to reassess her worsening claudication symptoms at that time. I encouraged her to walking be as active as possible in the meantime."  Last dose Eliquis 07/31/2017.  Anticipate pt can proceed with surgery as planned barring any acute status change.  VS: BP (!) 145/70   Pulse 70   Temp 36.4 C   Resp 20   Ht 5\' 3"  (1.6 m)   Wt 134 lb 1.6 oz (60.8 kg)   SpO2 100%   BMI 23.75 kg/m   PROVIDERS: [name], MD is PCP  Noureddine, Michiel Sites, MD is Cardiologist last seen in office by Elesa Massed, PA-C 07/22/2017 at which time she received surgical  clearance.  LABS: Labs reviewed: Acceptable for surgery. (all labs ordered are listed, but only abnormal results are displayed)  Labs Reviewed  GLUCOSE, CAPILLARY - Abnormal; Notable for the following components:      Result Value   Glucose-Capillary 182 (*)    All other components within normal limits  HEMOGLOBIN A1C - Abnormal; Notable for the following components:   Hgb A1c MFr Bld 7.8 (*)    All other components within normal limits  BASIC METABOLIC PANEL - Abnormal; Notable for the following components:   Glucose, Bld 179 (*)    GFR calc non Af Amer 58 (*)    All other components within normal limits  SURGICAL PCR SCREEN  CBC     IMAGES: CHEST  2 VIEW 02/17/2017 COMPARISON:  11/15/2016 FINDINGS: Lungs are clear. Cardiomediastinal silhouette is within normal. There is calcified plaque over the thoracic aorta. Mild degenerate change of the spine. IMPRESSION: No active cardiopulmonary disease.  EKG: 07/22/2017 ([facility], see copy in pt chart): Sinus rhythm. Nonspecific ST depression, Nonspecific T-abnormality.   CV: ECHO 04/07/2017 ([facility], see copy in pt chart) There is normal left ventricular wall thickness.  The left ventricular ejection fraction is normal 60 to 65%.  The left ventricular wall motion is normal.  There is mild 1+ mitral regurgitation.  There is moderate to moderately severe 2-3+ tricuspid regurgitation.  There is moderately severe 3+ aortic regurgitation present. There is  no aortic stenosis.  Mild aortic sclerosis is present with good valvular opening.  Right ventricular systolic pressure is elevated between 40 to 50 mmHg, consistent with moderate pulmonary hypertension.  Lexiscan 12/23/2016 Graphic evidence of inducible myocardial ischemia.  The post stress ejection fraction is 67%.  Global left ventricular systolic function is normal  Past Medical History:  Diagnosis Date  . A-fib ([place])   . Abnormal heart rhythm   . Allergic rhinitis   . Anxiety   .  Asthma   . Diabetes mellitus without complication ([place])    Type II  . GERD (gastroesophageal reflux disease)   . Headache   . Hypertension   . Osteopenia   . Rheumatoid arthritis ([place])   . Rotator cuff tear arthropathy    Right  . Vertigo     Past Surgical History:  Procedure Laterality Date  . CATARACT EXTRACTION     bilateral  . EYE SURGERY    . TUBAL LIGATION      MEDICATIONS: . apixaban (ELIQUIS) 5 MG TABS tablet  . aspirin 81 MG tablet  . Cholecalciferol (VITAMIN D3) 50000 units CAPS  . digoxin (LANOXIN) 0.125 MG tablet  . diltiazem (CARDIZEM CD) 240 MG 24 hr capsule  . donepezil (ARICEPT) 10 MG tablet  . dorzolamide (TRUSOPT) 2 % ophthalmic solution  . escitalopram (LEXAPRO) 20 MG tablet  . folic acid (FOLVITE) 1 MG tablet  . furosemide (LASIX) 20 MG tablet  . latanoprost (XALATAN) 0.005 % ophthalmic solution  . pantoprazole (PROTONIX) 20 MG tablet  . predniSONE (DELTASONE) 5 MG tablet  . timolol (BETIMOL) 0.5 % ophthalmic solution  . vitamin C (ASCORBIC ACID) 500 MG tablet   No current facility-administered medications for this encounter.     Wynonia Musty [facility] Short Stay Center/Anesthesiology Phone [phone] 07/29/2017 3:30 PM

## 2017-08-03 ENCOUNTER — Encounter (HOSPITAL_COMMUNITY): Payer: Self-pay | Admitting: Certified Registered Nurse Anesthetist

## 2017-08-03 MED ORDER — TRANEXAMIC ACID 1000 MG/10ML IV SOLN
1000.0000 mg | INTRAVENOUS | Status: AC
  Administered 2017-08-04: 1000 mg via INTRAVENOUS
  Filled 2017-08-03: qty 1100

## 2017-08-03 MED ORDER — DEXTROSE 5 % IV SOLN
3.0000 g | INTRAVENOUS | Status: AC
  Administered 2017-08-04: 2 g via INTRAVENOUS
  Filled 2017-08-03: qty 3

## 2017-08-04 ENCOUNTER — Encounter (HOSPITAL_COMMUNITY): Payer: Self-pay | Admitting: Surgery

## 2017-08-04 ENCOUNTER — Encounter (HOSPITAL_COMMUNITY)
Admission: RE | Disposition: A | Discharge status: Home / Self Care | Payer: Self-pay | Source: Ambulatory Visit | Attending: Orthopedic Surgery

## 2017-08-04 ENCOUNTER — Inpatient Hospital Stay (HOSPITAL_COMMUNITY)
Admission: RE | Admit: 2017-08-04 | Discharge: 2017-08-05 | DRG: 483 | Disposition: A | Discharge status: Home / Self Care | Payer: Medicare Other | Source: Ambulatory Visit | Attending: Orthopedic Surgery | Admitting: Orthopedic Surgery

## 2017-08-04 ENCOUNTER — Other Ambulatory Visit: Payer: Self-pay

## 2017-08-04 ENCOUNTER — Inpatient Hospital Stay (HOSPITAL_COMMUNITY): Payer: Medicare Other | Admitting: Anesthesiology

## 2017-08-04 ENCOUNTER — Inpatient Hospital Stay (HOSPITAL_COMMUNITY): Payer: Medicare Other | Admitting: Physician Assistant

## 2017-08-04 DIAGNOSIS — M069 Rheumatoid arthritis, unspecified: Secondary | ICD-10-CM | POA: Diagnosis present

## 2017-08-04 DIAGNOSIS — K219 Gastro-esophageal reflux disease without esophagitis: Secondary | ICD-10-CM | POA: Diagnosis not present

## 2017-08-04 DIAGNOSIS — I10 Essential (primary) hypertension: Secondary | ICD-10-CM | POA: Diagnosis present

## 2017-08-04 DIAGNOSIS — J45909 Unspecified asthma, uncomplicated: Secondary | ICD-10-CM | POA: Diagnosis not present

## 2017-08-04 DIAGNOSIS — E119 Type 2 diabetes mellitus without complications: Secondary | ICD-10-CM | POA: Diagnosis present

## 2017-08-04 DIAGNOSIS — M8580 Other specified disorders of bone density and structure, unspecified site: Secondary | ICD-10-CM | POA: Diagnosis present

## 2017-08-04 DIAGNOSIS — F419 Anxiety disorder, unspecified: Secondary | ICD-10-CM | POA: Diagnosis not present

## 2017-08-04 DIAGNOSIS — Z7901 Long term (current) use of anticoagulants: Secondary | ICD-10-CM | POA: Diagnosis not present

## 2017-08-04 DIAGNOSIS — Z79899 Other long term (current) drug therapy: Secondary | ICD-10-CM

## 2017-08-04 DIAGNOSIS — Z7952 Long term (current) use of systemic steroids: Secondary | ICD-10-CM | POA: Diagnosis not present

## 2017-08-04 DIAGNOSIS — I4891 Unspecified atrial fibrillation: Secondary | ICD-10-CM | POA: Diagnosis present

## 2017-08-04 DIAGNOSIS — Z96619 Presence of unspecified artificial shoulder joint: Secondary | ICD-10-CM

## 2017-08-04 DIAGNOSIS — M75101 Unspecified rotator cuff tear or rupture of right shoulder, not specified as traumatic: Principal | ICD-10-CM | POA: Diagnosis present

## 2017-08-04 HISTORY — PX: REVERSE SHOULDER ARTHROPLASTY: SHX5054

## 2017-08-04 LAB — GLUCOSE, CAPILLARY
GLUCOSE-CAPILLARY: 155 mg/dL — AB (ref 70–99)
Glucose-Capillary: 139 mg/dL — ABNORMAL HIGH (ref 70–99)

## 2017-08-04 LAB — PROTIME-INR
INR: 0.94
Prothrombin Time: 12.5 seconds (ref 11.4–15.2)

## 2017-08-04 SURGERY — ARTHROPLASTY, SHOULDER, TOTAL, REVERSE
Anesthesia: General | Site: Shoulder | Laterality: Right

## 2017-08-04 MED ORDER — PHENOL 1.4 % MT LIQD: 1.0000 | OROMUCOSAL | Status: DC | PRN

## 2017-08-04 MED ORDER — PROPOFOL 10 MG/ML IV BOLUS
INTRAVENOUS | Status: AC
  Filled 2017-08-04: qty 20

## 2017-08-04 MED ORDER — APIXABAN 5 MG PO TABS
5.0000 mg | ORAL_TABLET | Freq: Two times a day (BID) | ORAL | Status: DC
  Administered 2017-08-05: 5 mg via ORAL
  Filled 2017-08-04: qty 1

## 2017-08-04 MED ORDER — GLYCOPYRROLATE PF 0.2 MG/ML IJ SOSY
PREFILLED_SYRINGE | INTRAMUSCULAR | Status: DC | PRN
  Administered 2017-08-04 (×2): .2 mg via INTRAVENOUS

## 2017-08-04 MED ORDER — PANTOPRAZOLE SODIUM 40 MG PO TBEC
40.0000 mg | DELAYED_RELEASE_TABLET | Freq: Every day | ORAL | Status: DC
  Administered 2017-08-05: 40 mg via ORAL
  Filled 2017-08-04: qty 1

## 2017-08-04 MED ORDER — LIDOCAINE HCL (CARDIAC) PF 100 MG/5ML IV SOSY
PREFILLED_SYRINGE | INTRAVENOUS | Status: DC | PRN
  Administered 2017-08-04: 60 mg via INTRAVENOUS

## 2017-08-04 MED ORDER — MENTHOL 3 MG MT LOZG: 1.0000 | LOZENGE | OROMUCOSAL | Status: DC | PRN

## 2017-08-04 MED ORDER — PREDNISONE 10 MG PO TABS
10.0000 mg | ORAL_TABLET | Freq: Every day | ORAL | Status: DC
  Administered 2017-08-05: 10 mg via ORAL
  Filled 2017-08-04: qty 1

## 2017-08-04 MED ORDER — LACTATED RINGERS IV SOLN
INTRAVENOUS | Status: DC | PRN
  Administered 2017-08-04: 07:00:00 via INTRAVENOUS

## 2017-08-04 MED ORDER — SUGAMMADEX SODIUM 200 MG/2ML IV SOLN
INTRAVENOUS | Status: DC | PRN
  Administered 2017-08-04: 121.6 mg via INTRAVENOUS

## 2017-08-04 MED ORDER — ALUM & MAG HYDROXIDE-SIMETH 200-200-20 MG/5ML PO SUSP: 30.0000 mL | ORAL | Status: DC | PRN

## 2017-08-04 MED ORDER — FENTANYL CITRATE (PF) 250 MCG/5ML IJ SOLN
INTRAMUSCULAR | Status: AC
  Filled 2017-08-04: qty 5

## 2017-08-04 MED ORDER — ROCURONIUM BROMIDE 10 MG/ML (PF) SYRINGE
PREFILLED_SYRINGE | INTRAVENOUS | Status: AC
  Filled 2017-08-04: qty 10

## 2017-08-04 MED ORDER — DOCUSATE SODIUM 100 MG PO CAPS
100.0000 mg | ORAL_CAPSULE | Freq: Two times a day (BID) | ORAL | Status: DC
  Administered 2017-08-04 – 2017-08-05 (×2): 100 mg via ORAL
  Filled 2017-08-04 (×2): qty 1

## 2017-08-04 MED ORDER — SODIUM CHLORIDE 0.9 % IV SOLN
INTRAVENOUS | Status: DC | PRN
  Administered 2017-08-04: 30 ug/min via INTRAVENOUS

## 2017-08-04 MED ORDER — POLYETHYLENE GLYCOL 3350 17 G PO PACK: 17.0000 g | PACK | Freq: Every day | ORAL | Status: DC | PRN

## 2017-08-04 MED ORDER — ONDANSETRON HCL 4 MG PO TABS: 4.0000 mg | ORAL_TABLET | Freq: Four times a day (QID) | ORAL | Status: DC | PRN

## 2017-08-04 MED ORDER — DIGOXIN 125 MCG PO TABS
125.0000 ug | ORAL_TABLET | Freq: Every day | ORAL | Status: DC
  Filled 2017-08-04: qty 1

## 2017-08-04 MED ORDER — TIZANIDINE HCL 2 MG PO TABS
2.0000 mg | ORAL_TABLET | Freq: Four times a day (QID) | ORAL | Status: DC | PRN
Start: 2017-08-04 — End: 2017-08-05

## 2017-08-04 MED ORDER — METOCLOPRAMIDE HCL 5 MG PO TABS: 5.0000 mg | ORAL_TABLET | Freq: Three times a day (TID) | ORAL | Status: DC | PRN

## 2017-08-04 MED ORDER — DEXAMETHASONE SODIUM PHOSPHATE 10 MG/ML IJ SOLN
INTRAMUSCULAR | Status: AC
  Filled 2017-08-04: qty 1

## 2017-08-04 MED ORDER — ONDANSETRON HCL 4 MG/2ML IJ SOLN
INTRAMUSCULAR | Status: AC
  Filled 2017-08-04: qty 2

## 2017-08-04 MED ORDER — DIPHENHYDRAMINE HCL 12.5 MG/5ML PO ELIX: 12.5000 mg | ORAL_SOLUTION | ORAL | Status: DC | PRN

## 2017-08-04 MED ORDER — DEXAMETHASONE SODIUM PHOSPHATE 10 MG/ML IJ SOLN
INTRAMUSCULAR | Status: DC | PRN
  Administered 2017-08-04: 4 mg via INTRAVENOUS

## 2017-08-04 MED ORDER — DILTIAZEM HCL ER COATED BEADS 240 MG PO CP24
240.0000 mg | ORAL_CAPSULE | Freq: Every day | ORAL | Status: DC
  Administered 2017-08-05: 240 mg via ORAL
  Filled 2017-08-04: qty 1

## 2017-08-04 MED ORDER — METOCLOPRAMIDE HCL 5 MG/ML IJ SOLN: 5.0000 mg | Freq: Three times a day (TID) | INTRAMUSCULAR | Status: DC | PRN

## 2017-08-04 MED ORDER — BISACODYL 5 MG PO TBEC: 5.0000 mg | DELAYED_RELEASE_TABLET | Freq: Every day | ORAL | Status: DC | PRN

## 2017-08-04 MED ORDER — LIDOCAINE 2% (20 MG/ML) 5 ML SYRINGE
INTRAMUSCULAR | Status: AC
  Filled 2017-08-04: qty 5

## 2017-08-04 MED ORDER — ROCURONIUM BROMIDE 100 MG/10ML IV SOLN
INTRAVENOUS | Status: DC | PRN
  Administered 2017-08-04: 30 mg via INTRAVENOUS

## 2017-08-04 MED ORDER — MAGNESIUM CITRATE PO SOLN: 1.0000 | Freq: Once | ORAL | Status: DC | PRN

## 2017-08-04 MED ORDER — CEFAZOLIN SODIUM-DEXTROSE 2-4 GM/100ML-% IV SOLN: 2.0000 g | INTRAVENOUS | Status: DC

## 2017-08-04 MED ORDER — BUPIVACAINE HCL (PF) 0.5 % IJ SOLN
INTRAMUSCULAR | Status: DC | PRN
  Administered 2017-08-04: 25 mL via PERINEURAL

## 2017-08-04 MED ORDER — OXYCODONE HCL 5 MG PO TABS
10.0000 mg | ORAL_TABLET | ORAL | Status: DC | PRN
  Filled 2017-08-04: qty 2

## 2017-08-04 MED ORDER — ASPIRIN EC 81 MG PO TBEC
81.0000 mg | DELAYED_RELEASE_TABLET | Freq: Every day | ORAL | Status: DC
  Administered 2017-08-05: 81 mg via ORAL
  Filled 2017-08-04: qty 1

## 2017-08-04 MED ORDER — GLYCOPYRROLATE PF 0.2 MG/ML IJ SOSY
PREFILLED_SYRINGE | INTRAMUSCULAR | Status: AC
  Filled 2017-08-04: qty 1

## 2017-08-04 MED ORDER — PROPOFOL 10 MG/ML IV BOLUS
INTRAVENOUS | Status: DC | PRN
  Administered 2017-08-04: 20 mg via INTRAVENOUS
  Administered 2017-08-04: 80 mg via INTRAVENOUS

## 2017-08-04 MED ORDER — ONDANSETRON HCL 4 MG/2ML IJ SOLN
INTRAMUSCULAR | Status: DC | PRN
  Administered 2017-08-04: 4 mg via INTRAVENOUS

## 2017-08-04 MED ORDER — LACTATED RINGERS IV SOLN
INTRAVENOUS | Status: DC
  Administered 2017-08-04: 13:00:00 via INTRAVENOUS

## 2017-08-04 MED ORDER — FENTANYL CITRATE (PF) 100 MCG/2ML IJ SOLN
INTRAMUSCULAR | Status: DC | PRN
  Administered 2017-08-04 (×2): 50 ug via INTRAVENOUS

## 2017-08-04 MED ORDER — 0.9 % SODIUM CHLORIDE (POUR BTL) OPTIME
TOPICAL | Status: DC | PRN
  Administered 2017-08-04: 1000 mL

## 2017-08-04 MED ORDER — ACETAMINOPHEN 325 MG PO TABS: 325.0000 mg | ORAL_TABLET | Freq: Four times a day (QID) | ORAL | Status: DC | PRN

## 2017-08-04 MED ORDER — PROMETHAZINE HCL 25 MG/ML IJ SOLN: 6.2500 mg | INTRAMUSCULAR | Status: DC | PRN

## 2017-08-04 MED ORDER — ONDANSETRON HCL 4 MG/2ML IJ SOLN: 4.0000 mg | Freq: Four times a day (QID) | INTRAMUSCULAR | Status: DC | PRN

## 2017-08-04 MED ORDER — MIDAZOLAM HCL 2 MG/2ML IJ SOLN
INTRAMUSCULAR | Status: AC
  Filled 2017-08-04: qty 2

## 2017-08-04 MED ORDER — SUCCINYLCHOLINE CHLORIDE 20 MG/ML IJ SOLN
INTRAMUSCULAR | Status: DC | PRN
  Administered 2017-08-04: 100 mg via INTRAVENOUS

## 2017-08-04 MED ORDER — HYDROMORPHONE HCL 1 MG/ML IJ SOLN: 0.5000 mg | INTRAMUSCULAR | Status: DC | PRN

## 2017-08-04 MED ORDER — CHLORHEXIDINE GLUCONATE 4 % EX LIQD: 60.0000 mL | Freq: Once | CUTANEOUS | Status: DC

## 2017-08-04 MED ORDER — ESCITALOPRAM OXALATE 20 MG PO TABS
20.0000 mg | ORAL_TABLET | Freq: Every day | ORAL | Status: DC
  Administered 2017-08-05: 20 mg via ORAL
  Filled 2017-08-04: qty 1

## 2017-08-04 MED ORDER — HYDROMORPHONE HCL 1 MG/ML IJ SOLN: 0.2500 mg | INTRAMUSCULAR | Status: DC | PRN

## 2017-08-04 MED ORDER — OXYCODONE HCL 5 MG PO TABS
5.0000 mg | ORAL_TABLET | ORAL | Status: DC | PRN
  Administered 2017-08-04 – 2017-08-05 (×3): 10 mg via ORAL
  Filled 2017-08-04 (×2): qty 2

## 2017-08-04 SURGICAL SUPPLY — 54 items
BASEPLATE GLENOID SHLDR SM (Shoulder) ×2 IMPLANT
BLADE SAW SGTL 83.5X18.5 (BLADE) ×2 IMPLANT
COVER SURGICAL LIGHT HANDLE (MISCELLANEOUS) ×2 IMPLANT
CUP SUT UNIV REVERS 36 NEUTRAL (Cup) ×2 IMPLANT
DERMABOND ADVANCED (GAUZE/BANDAGES/DRESSINGS) ×1
DERMABOND ADVANCED .7 DNX12 (GAUZE/BANDAGES/DRESSINGS) ×1 IMPLANT
DRAPE ORTHO SPLIT 77X108 STRL (DRAPES) ×2
DRAPE SURG 17X11 SM STRL (DRAPES) ×2 IMPLANT
DRAPE SURG 17X23 STRL (DRAPES) ×4 IMPLANT
DRAPE SURG ORHT 6 SPLT 77X108 (DRAPES) ×2 IMPLANT
DRAPE U-SHAPE 47X51 STRL (DRAPES) ×4 IMPLANT
DRSG AQUACEL AG ADV 3.5X10 (GAUZE/BANDAGES/DRESSINGS) ×2 IMPLANT
DURAPREP 26ML APPLICATOR (WOUND CARE) ×2 IMPLANT
ELECT BLADE 4.0 EZ CLEAN MEGAD (MISCELLANEOUS) ×2
ELECT CAUTERY BLADE 6.4 (BLADE) ×2 IMPLANT
ELECT REM PT RETURN 9FT ADLT (ELECTROSURGICAL) ×2
ELECTRODE BLDE 4.0 EZ CLN MEGD (MISCELLANEOUS) ×1 IMPLANT
ELECTRODE REM PT RTRN 9FT ADLT (ELECTROSURGICAL) ×1 IMPLANT
FACESHIELD WRAPAROUND (MASK) ×8 IMPLANT
GLENOSPHERE LATERAL 36MM+4 (Shoulder) ×2 IMPLANT
GLOVE BIO SURGEON STRL SZ7.5 (GLOVE) ×2 IMPLANT
GLOVE BIO SURGEON STRL SZ8 (GLOVE) ×2 IMPLANT
GLOVE EUDERMIC 7 POWDERFREE (GLOVE) ×2 IMPLANT
GLOVE SS BIOGEL STRL SZ 7.5 (GLOVE) ×1 IMPLANT
GLOVE SUPERSENSE BIOGEL SZ 7.5 (GLOVE) ×1
GOWN STRL REUS W/ TWL LRG LVL3 (GOWN DISPOSABLE) ×1 IMPLANT
GOWN STRL REUS W/ TWL XL LVL3 (GOWN DISPOSABLE) ×2 IMPLANT
GOWN STRL REUS W/TWL LRG LVL3 (GOWN DISPOSABLE) ×1
GOWN STRL REUS W/TWL XL LVL3 (GOWN DISPOSABLE) ×2
INSERT HUMERAL 36 +6 (Shoulder) ×2 IMPLANT
KIT BASIN OR (CUSTOM PROCEDURE TRAY) ×2 IMPLANT
KIT TURNOVER KIT B (KITS) ×2 IMPLANT
MANIFOLD NEPTUNE II (INSTRUMENTS) ×2 IMPLANT
NEEDLE TAPERED W/ NITINOL LOOP (MISCELLANEOUS) ×2 IMPLANT
NS IRRIG 1000ML POUR BTL (IV SOLUTION) ×2 IMPLANT
PACK SHOULDER (CUSTOM PROCEDURE TRAY) ×2 IMPLANT
PAD ARMBOARD 7.5X6 YLW CONV (MISCELLANEOUS) ×4 IMPLANT
RESTRAINT HEAD UNIVERSAL NS (MISCELLANEOUS) ×2 IMPLANT
SCREW CENTRAL NONLOCK 25MM (Screw) ×2 IMPLANT
SCREW LOCK PERIPHERAL 30MM (Shoulder) ×2 IMPLANT
SCREW LOCK PERIPHERAL 36MM (Screw) ×2 IMPLANT
SET PIN UNIVERSAL REVERSE (SET/KITS/TRAYS/PACK) ×2 IMPLANT
SLING ARM FOAM STRAP LRG (SOFTGOODS) ×2 IMPLANT
SPONGE LAP 18X18 X RAY DECT (DISPOSABLE) ×2 IMPLANT
SPONGE LAP 4X18 RFD (DISPOSABLE) ×2 IMPLANT
STEM HUMERAL UNI REVERS SZ6 (Stem) ×2 IMPLANT
SUT MNCRL AB 3-0 PS2 18 (SUTURE) ×2 IMPLANT
SUT MON AB 2-0 CT1 27 (SUTURE) ×2 IMPLANT
SUT VIC AB 1 CT1 27 (SUTURE) ×1
SUT VIC AB 1 CT1 27XBRD ANBCTR (SUTURE) ×1 IMPLANT
SUTURE TAPE 1.3 40 TPR END (SUTURE) ×1 IMPLANT
SUTURETAPE 1.3 40 TPR END (SUTURE) ×2
TOWEL OR 17X26 10 PK STRL BLUE (TOWEL DISPOSABLE) ×2 IMPLANT
WATER STERILE IRR 1000ML POUR (IV SOLUTION) ×2 IMPLANT

## 2017-08-04 NOTE — Anesthesia Procedure Notes (Signed)
Anesthesia Regional Block: Interscalene brachial plexus block   Pre-Anesthetic Checklist: ,, timeout performed, Correct Patient, Correct Site, Correct Laterality, Correct Procedure, Correct Position, site marked, Risks and benefits discussed,  Surgical consent,  Pre-op evaluation,  At surgeon's request and post-op pain management  Laterality: Right  Prep: chloraprep       Needles:  Injection technique: Single-shot  Needle Type: Echogenic Needle     Needle Length: 9cm      Additional Needles:   Procedures:,,,, ultrasound used (permanent image in chart),,,,  Narrative:  Start time: 08/04/2017 7:00 AM End time: 08/04/2017 7:10 AM Injection made incrementally with aspirations every 5 mL.  Performed by: Personally  Anesthesiologist: Myrtie Soman, MD  Additional Notes: Patient tolerated the procedure well without complications

## 2017-08-04 NOTE — Progress Notes (Signed)
Report given to [employer] as caregiver

## 2017-08-04 NOTE — Discharge Instructions (Signed)
° °Kevin M. Supple, M.D., F.A.A.O.S. °Orthopaedic Surgery °Specializing in Arthroscopic and Reconstructive °Surgery of the Shoulder and Knee °[phone] °[address] - [place], [place] [address] - Fax [phone] ° ° °POST-OP TOTAL SHOULDER REPLACEMENT INSTRUCTIONS ° °1. Call the office at [phone] to schedule your first post-op appointment 10-14 days from the date of your surgery. ° °2. The bandage over your incision is waterproof. You may begin showering with this dressing on. You may leave this dressing on until first follow up appointment within 2 weeks. We prefer you leave this dressing in place until follow up however after 5-7 days if you are having itching or skin irritation and would like to remove it you may do so. Go slow and tug at the borders gently to break the bond the dressing has with the skin. At this point if there is no drainage it is okay to go without a bandage or you may cover it with a light guaze and tape. You can also expect significant bruising around your shoulder that will drift down your arm and into your chest wall. This is very normal and should resolve over several days. ° ° 3. Wear your sling/immobilizer at all times except to perform the exercises below or to occasionally let your arm dangle by your side to stretch your elbow. You also need to sleep in your sling immobilizer until instructed otherwise. ° °4. Range of motion to your elbow, wrist, and hand are encouraged 3-5 times daily. Exercise to your hand and fingers helps to reduce swelling you may experience. ° °5. Utilize ice to the shoulder 3-5 times minimum a day and additionally if you are experiencing pain. ° °6. Prescriptions for a pain medication and a muscle relaxant are provided for you. It is recommended that if you are experiencing pain that you pain medication alone is not controlling, add the muscle relaxant along with the pain medication which can give additional pain relief. The first 1-2 days  is generally the most severe of your pain and then should gradually decrease. As your pain lessens it is recommended that you decrease your use of the pain medications to an "as needed basis'" only and to always comply with the recommended dosages of the pain medications. ° °7. Pain medications can produce constipation along with their use. If you experience this, the use of an over the counter stool softener or laxative daily is recommended.  ° °8. For additional questions or concerns, please do not hesitate to call the office. If after hours there is an answering service to forward your concerns to the physician on call. ° °9.Pain control following an exparel block ° °To help control your post-operative pain you received a nerve block  performed with Exparel which is a long acting anesthetic (numbing agent) which can provide pain relief and sensations of numbness (and relief of pain) in the operative shoulder and arm for up to 3 days. Sometimes it provides mixed relief, meaning you may still have numbness in certain areas of the arm but can still be able to move  parts of that arm, hand, and fingers. We recommend that your prescribed pain medications  be used as needed. We do not feel it is necessary to "pre medicate" and "stay ahead" of pain.  Taking narcotic pain medications when you are not having any pain can lead to unnecessary and potentially dangerous side effects.  ° °POST-OP EXERCISES ° °Pendulum Exercises ° °Perform pendulum exercises while standing and bending at   the waist. Support your uninvolved arm on a table or chair and allow your operated arm to hang freely. Make sure to do these exercises passively - not using you shoulder muscles. ° °Repeat 20 times. Do 3 sessions per day. ° ° ° ° °

## 2017-08-04 NOTE — Op Note (Signed)
08/04/2017  9:10 AM  PATIENT:   Lorraine Gilbert  84 y.o. female  PRE-OPERATIVE DIAGNOSIS:  right shoulder rotator cuff tear arthropathy  POST-OPERATIVE DIAGNOSIS:  same  PROCEDURE:  R RSA #6 stem, +6 poly, 36/+4 glenosphere, small base plate  SURGEON:  Kenedi Cilia, Metta Clines M.D.  ASSISTANTS: Shuford pac   ANESTHESIA:   GET + ISB  EBL: 150  SPECIMEN:  none  Drains: none   PATIENT DISPOSITION:  PACU - hemodynamically stable.    PLAN OF CARE: Admit for overnight observation  Brief history:  Lorraine Gilbert presents with a long history of increasing right shoulder pain with associated functional limitations related to end-stage rotator cuff tear arthropathy.  Plain radiographs confirm glenohumeral joint arthrosis with a high riding humeral head consistent with rotator cuff tear arthropathy.  Due to her increasing pain and functional mentation she is brought to the operating room at this time for planned right shoulder reverse arthroplasty  Preoperative counseled Lorraine Gilbert regarding treatment options as well as the potential risks versus benefits thereof.  Possible surgical complications were reviewed including potential for bleeding, infection, neurovascular injury, persistent pain, anesthetic complication, failure of the implant, and possible need for additional surgery.  She understands and accepts and agrees with her planned procedure.  Procedure in detail:  After undergoing routine preop evaluation patient received prophylactic antibiotics and received an interscalene block with Exparel in the holding area by the anesthesia department.  Brought to the operating placed supine on the operative underwent smooth induction of a general endotracheal anesthesia.  Placed into the beachchair position and appropriate padding protected.  Right shoulder girdle region was sterilely prepped and draped in standard fashion.  Timeout was called.  An anterior deltopectoral approach to the right shoulder was  made through an 8 cm incision.  Skin flaps were elevated dissection carried deeply cephalic vein identified and retracted lateral to the deltoid upper centimeter the pectoralis major tendon was then tenotomized to improve exposure.  There is been previous rupture long head biceps tendon.  Conjoined tendon was then mobilized and retracted medially.  Subscapularis was then divided away from the lesser tuberosity intact with a pair of suture tape sutures.  We then divided the capsular attachments from the anterior inferior and inferior aspects of the humeral neck allowing deliver the head through the wound.  Remnant of the rotator cuff was divided superiorly.  We outlined the proposed humeral head resection was then performed with an oscillating saw at approximately 30 degrees retroversion.  Residual osteophytes on the margin of the humeral neck were then removed.  Middle Place of the cut surface the proximal humerus.  We then exposed the glenoid and performed a circumferential labral resection gaining complete visualization of the margins of the glenoid.  A central guidepin was then placed with an approximate 10 degree inferior tilt and the glenoid was then reamed with both the central and peripheral reamers.  Soft tissue bony debris was then meticulously removed.  Our small baseplate was then impacted into position and a central lag screw was then placed with excellent fixation followed by the inferior and superior locking screws all of which obtained good purchase and fixation.  A 36+4 glenosphere was then impacted over the baseplate with excellent fit and fixation.  We then returned our attention to the proximal humerus where we performed canal reaming by hand up to size 7 broached up to size 6 with excellent fit and fixation native retroversion.  The proximal metaphysis was then reamed.  A trial was then placed and this showed good soft tissue balance good shoulder motion good stability.  We assembled the final  implant on the back table with a neutral metaphysis.  This was then impacted in position with excellent interference fit and fixation.  Trial reductions were then performed ultimately the plastics probably give [place] the best soft tissue balance.  Trials were removed the plastic spine was impacted onto the humeral stem the joint was copiously irrigated final reduction was performed again very pleased with the overall stability and soft tissue balance.  The subscapularis was then.  Repaired back to the metaphysis of the implant through the eyelets on the collar the implant allowing excellent apposition and easily achieved 45 degrees of flexion rotation without excessive tension on the repair.  Wound was then irrigated hemostasis was obtained.  The deltopectoral interval was then reapproximated with a series of figure-of-eight and 1 Vicryl sutures.  2-0 Vicryl used for subcu layer intracuticular 3-0 Monocryl for the skin followed by Dermabond and Aquasol dressing.  Right arm then placed in sling patient awakened, extubated, and taken to the recovery room in stable condition.  Lorraine Gilbert for PA-C was used as an Environmental consultant throughout this case essential for help with positioning the patient, position extremity, tissue manipulation, implantation of the prosthesis, wound closure, and intraoperative decision-making.   Lorraine Gilbert # [phone]

## 2017-08-04 NOTE — H&P (Signed)
Lorraine Gilbert    Chief Complaint: right shoulder rotator cuff tear arthropathy HPI: The patient is a 84 y.o. female with end stage right shoulder rotator cuff tear arthropathy  Past Medical History:  Diagnosis Date  . A-fib ([place])   . Abnormal heart rhythm   . Allergic rhinitis   . Anxiety   . Asthma   . Diabetes mellitus without complication ([place])    Type II  . GERD (gastroesophageal reflux disease)   . Headache   . Hypertension   . Osteopenia   . Rheumatoid arthritis ([place])   . Rotator cuff tear arthropathy    Right  . Vertigo     Past Surgical History:  Procedure Laterality Date  . CATARACT EXTRACTION     bilateral  . EYE SURGERY    . TUBAL LIGATION      Family History  Problem Relation Age of Onset  . Emphysema Father        smoked  . Heart disease Mother   . Breast cancer Neg Hx     Social History:  reports that she has never smoked. She has never used smokeless tobacco. She reports that she does not drink alcohol or use drugs.   Medications Prior to Admission  Medication Sig Dispense Refill  . apixaban (ELIQUIS) 5 MG TABS tablet Take 5 mg by mouth 2 (two) times daily.    Marland Kitchen aspirin 81 MG tablet Take 81 mg by mouth every morning.     . Cholecalciferol (VITAMIN D3) 50000 units CAPS Take 50,000 Units by mouth every Wednesday.    . digoxin (LANOXIN) 0.125 MG tablet Take 125 mcg by mouth every morning.     . diltiazem (CARDIZEM CD) 240 MG 24 hr capsule Take 240 mg by mouth daily.    . dorzolamide (TRUSOPT) 2 % ophthalmic solution Place 1 drop into both eyes 2 (two) times daily.    Marland Kitchen escitalopram (LEXAPRO) 20 MG tablet Take 20 mg by mouth daily.     . folic acid (FOLVITE) 1 MG tablet Take 1 mg by mouth every morning.     . furosemide (LASIX) 20 MG tablet Take 20 mg by mouth daily as needed for fluid.  11  . latanoprost (XALATAN) 0.005 % ophthalmic solution Place 1 drop into both eyes at bedtime.    . pantoprazole (PROTONIX) 20 MG tablet Take 40 mg by mouth daily  before breakfast.     . predniSONE (DELTASONE) 5 MG tablet Take 10 mg by mouth daily with breakfast.    . timolol (BETIMOL) 0.5 % ophthalmic solution Place 1 drop into both eyes daily.     . vitamin C (ASCORBIC ACID) 500 MG tablet Take 500 mg by mouth daily.    Marland Kitchen donepezil (ARICEPT) 10 MG tablet Take 1/2 tablet daily for 1 month, then increase to 1 tablet daily (Patient not taking: Reported on 07/25/2017) 30 tablet 11     Physical Exam: right shoulder with painful and restricted motion as noted at recent office visits  Vitals  Temp:  [97.6 F (36.4 C)] 97.6 F (36.4 C) (07/18 0543) Pulse Rate:  [64] 64 (07/18 0543) Resp:  [18] 18 (07/18 0543) BP: (174)/(35) 174/35 (07/18 0543) SpO2:  [100 %] 100 % (07/18 0543)  Assessment/Plan  Impression: right shoulder rotator cuff tear arthropathy  Plan of Action: Procedure(s): RIGHT REVERSE SHOULDER ARTHROPLASTY  Sharaya Boruff M Lachell Rochette 08/04/2017, 6:03 AM Contact # [phone]

## 2017-08-04 NOTE — Anesthesia Preprocedure Evaluation (Addendum)
Anesthesia Evaluation  Patient identified by MRN, date of birth, ID band Patient awake    Reviewed: Allergy & Precautions, NPO status , Patient's Chart, lab work & pertinent test results  Airway Mallampati: II  TM Distance: >3 FB Neck ROM: Full    Dental no notable dental hx.    Pulmonary neg pulmonary ROS,    Pulmonary exam normal breath sounds clear to auscultation       Cardiovascular hypertension, Normal cardiovascular exam+ dysrhythmias Atrial Fibrillation + Valvular Problems/Murmurs MR and AI  Rhythm:Regular Rate:Normal     Neuro/Psych negative neurological ROS  negative psych ROS   GI/Hepatic negative GI ROS, Neg liver ROS,   Endo/Other  diabetes  Renal/GU negative Renal ROS  negative genitourinary   Musculoskeletal negative musculoskeletal ROS (+)   Abdominal   Peds negative pediatric ROS (+)  Hematology negative hematology ROS (+)   Anesthesia Other Findings   Reproductive/Obstetrics negative OB ROS                            Anesthesia Physical Anesthesia Plan  ASA: III  Anesthesia Plan: General   Post-op Pain Management:  Regional for Post-op pain   Induction: Intravenous  PONV Risk Score and Plan: 3 and Ondansetron, Dexamethasone and Treatment may vary due to age or medical condition  Airway Management Planned: Oral ETT  Additional Equipment:   Intra-op Plan:   Post-operative Plan: Extubation in OR  Informed Consent: I have reviewed the patients History and Physical, chart, labs and discussed the procedure including the risks, benefits and alternatives for the proposed anesthesia with the patient or authorized representative who has indicated his/her understanding and acceptance.   Dental advisory given  Plan Discussed with: CRNA and Surgeon  Anesthesia Plan Comments:         Anesthesia Quick Evaluation

## 2017-08-04 NOTE — Anesthesia Procedure Notes (Signed)
Anesthesia Procedure Image    

## 2017-08-04 NOTE — Anesthesia Postprocedure Evaluation (Signed)
Anesthesia Post Note  Patient: Lorraine Gilbert  Procedure(s) Performed: RIGHT REVERSE SHOULDER ARTHROPLASTY (Right Shoulder)     Patient location during evaluation: PACU Anesthesia Type: General Level of consciousness: awake and alert Pain management: pain level controlled Vital Signs Assessment: post-procedure vital signs reviewed and stable Respiratory status: spontaneous breathing, nonlabored ventilation, respiratory function stable and patient connected to nasal cannula oxygen Cardiovascular status: blood pressure returned to baseline and stable Postop Assessment: no apparent nausea or vomiting Anesthetic complications: no    Last Vitals:  Vitals:   08/04/17 0945 08/04/17 1000  BP: (!) 162/57   Pulse: 86 88  Resp: 18 20  Temp:    SpO2: 98% 98%    Last Pain:  Vitals:   08/04/17 1000  TempSrc:   PainSc: 0-No pain                 Deltha Bernales S

## 2017-08-04 NOTE — Anesthesia Procedure Notes (Signed)
Procedure Name: Intubation Date/Time: 08/04/2017 7:45 AM Performed by: Marsa Aris, CRNA Pre-anesthesia Checklist: Patient identified, Emergency Drugs available, Suction available, Patient being monitored and Timeout performed Patient Re-evaluated:Patient Re-evaluated prior to induction Oxygen Delivery Method: Circle system utilized Preoxygenation: Pre-oxygenation with 100% oxygen Induction Type: IV induction Ventilation: Mask ventilation without difficulty Laryngoscope Size: Miller and 2 Grade View: Grade II Tube size: 7.0 mm Number of attempts: 1 Airway Equipment and Method: Stylet Placement Confirmation: ETT inserted through vocal cords under direct vision,  positive ETCO2,  CO2 detector and breath sounds checked- equal and bilateral Secured at: 21 cm Tube secured with: Tape Dental Injury: Teeth and Oropharynx as per pre-operative assessment  Comments: C-spine neutrality maintained, dentition same as pre-op

## 2017-08-04 NOTE — Transfer of Care (Signed)
Immediate Anesthesia Transfer of Care Note  Patient: Lorraine Gilbert  Procedure(s) Performed: RIGHT REVERSE SHOULDER ARTHROPLASTY (Right Shoulder)  Patient Location: PACU  Anesthesia Type:General and GA combined with regional for post-op pain  Level of Consciousness: drowsy and responds to stimulation  Airway & Oxygen Therapy: Patient Spontanous Breathing and Patient connected to face mask oxygen  Post-op Assessment: Report given to RN and Post -op Vital signs reviewed and stable  Post vital signs: Reviewed and stable  Last Vitals:  Vitals Value Taken Time  BP    Temp    Pulse    Resp    SpO2      Last Pain:  Vitals:   08/04/17 0935  TempSrc:   PainSc: (P) 0-No pain      Patients Stated Pain Goal: 3 (37/36/68 1594)  Complications: No apparent anesthesia complications

## 2017-08-05 ENCOUNTER — Encounter (HOSPITAL_COMMUNITY): Payer: Self-pay | Admitting: Orthopedic Surgery

## 2017-08-05 MED ORDER — ONDANSETRON HCL 4 MG PO TABS: 4.0000 mg | ORAL_TABLET | Freq: Three times a day (TID) | ORAL | 0 refills | Status: DC | PRN

## 2017-08-05 MED ORDER — TIZANIDINE HCL 4 MG PO CAPS: 4.0000 mg | ORAL_CAPSULE | Freq: Three times a day (TID) | ORAL | 1 refills | Status: DC | PRN

## 2017-08-05 MED ORDER — OXYCODONE-ACETAMINOPHEN 5-325 MG PO TABS: 1.0000 | ORAL_TABLET | ORAL | 0 refills | Status: DC | PRN

## 2017-08-05 NOTE — Evaluation (Addendum)
Occupational Therapy Evaluation Patient Details Name: Lorraine Gilbert MRN: [id number] DOB: 01/02/1933 Today's Date: 08/05/2017    History of Present Illness Pt is an 84 y/o female now s/p reverse R TSA. PMHx includes a-fib, DM, HTN, vertigo, anxiety, RA   Clinical Impression   This 84 y/o female presents with the above. At baseline pt is independent with ADLs and functional mobility. Pt limited this session due to pain and dizziness with mobility (BP 158/46). Pt requiring minA for short distance functional mobility in room using HHA; currently requires mod-maxA for UB and LB ADLs. Began education regarding shoulder precautions, safety and compensatory techniques for completing ADLs while maintaining precautions with pt verbalizing understanding. Pt lives with spouse, reports her sister will be staying initially to assist with ADL/iADL needs after return home. Pt participating in AROM to elbow, wrist, and hand this session and completing seated dangle, though not able to progress to pendulums due to safety concerns. Will benefit from additional OT session prior to discharge to further review shoulder protocol, safety and compensatory strategies for completing ADLs and functional mobility.     Follow Up Recommendations  Follow surgeon's recommendation for DC plan and follow-up therapies;Supervision/Assistance - 24 hour    Equipment Recommendations  None recommended by OT           Precautions / Restrictions Precautions Precautions: Shoulder Type of Shoulder Precautions: sling at all times except ADL/exercise; NWB UE, AROM e/w/h to tolerance; no resisted internal rotation, no exercises for internal rotation; okay to use operative UE for ADLs within follow range: ER 0-20, ABD 0-45, FE 0-60; okay for pendulums and lap slides  Shoulder Interventions: Shoulder sling/immobilizer;At all times;Off for dressing/bathing/exercises Precaution Booklet Issued: Yes (comment) Precaution Comments: issued and  reviewed with pt  Required Braces or Orthoses: Sling Restrictions Weight Bearing Restrictions: Yes RUE Weight Bearing: Non weight bearing      Mobility Bed Mobility Overal bed mobility: Needs Assistance Bed Mobility: Supine to Sit;Sit to Supine     Supine to sit: Min assist Sit to supine: Min assist   General bed mobility comments: assist to guide and support trunk upright and when returning to supine; increased time and effort, HOB slightly elevated   Transfers Overall transfer level: Needs assistance Equipment used: 1 person hand held assist Transfers: Sit to/from Stand Sit to Stand: Min assist         General transfer comment: assist to rise and stabilize in standing     Balance Overall balance assessment: Needs assistance Sitting-balance support: Feet supported Sitting balance-Leahy Scale: Fair     Standing balance support: Single extremity supported Standing balance-Leahy Scale: Poor Standing balance comment: reliant on UE support and external support from therapist                            ADL either performed or assessed with clinical judgement   ADL Overall ADL's : Needs assistance/impaired Eating/Feeding: Set up;Sitting   Grooming: Set up;Min guard;Sitting   Upper Body Bathing: Minimal assistance;Sitting   Lower Body Bathing: Moderate assistance;Sit to/from stand   Upper Body Dressing : Maximal assistance;Sitting Upper Body Dressing Details (indicate cue type and reason): maxA for sling management this session  Lower Body Dressing: Maximal assistance;Sit to/from stand Lower Body Dressing Details (indicate cue type and reason): minA for standing balance  Toilet Transfer: Minimal assistance;Ambulation;Regular Toilet   Toileting- Clothing Manipulation and Hygiene: Minimal assistance;Sit to/from stand       Functional mobility  during ADLs: Minimal assistance General ADL Comments: pt with increased dizziness upon sitting EOB and with  standing and also limited due to pain, BP taken sitting EOB and 158/46. pt ambulating around EOB to sit EOB on opposite side, requiring minA and pt taking slow cautious steps throughout; began education regarding shoulder precautions, safety and compensatory techniques for completing ADLs while maintaining precautions; will benefit from continued review      Vision         Perception     Praxis      Pertinent Vitals/Pain Pain Assessment: Faces Faces Pain Scale: Hurts even more Pain Location: RUE  Pain Descriptors / Indicators: Grimacing;Guarding;Sharp Pain Intervention(s): Monitored during session;Repositioned;Ice applied     Hand Dominance Right   Extremity/Trunk Assessment Upper Extremity Assessment Upper Extremity Assessment: RUE deficits/detail RUE Deficits / Details: s/p reverse TSA  RUE: Unable to fully assess due to immobilization;Unable to fully assess due to pain   Lower Extremity Assessment Lower Extremity Assessment: Generalized weakness       Communication Communication Communication: No difficulties   Cognition Arousal/Alertness: Awake/alert Behavior During Therapy: WFL for tasks assessed/performed Overall Cognitive Status: Within Functional Limits for tasks assessed                                     General Comments  pt's spouse present during session    Exercises Shoulder Exercises Pendulum Exercise: Limitations Pendulum Limitations: completed dangle at EOB, not able to safely progress to pendulums at this time  Elbow Flexion: AAROM;5 reps;Seated;Right Elbow Extension: AAROM;5 reps;Right;Seated Wrist Flexion: AROM;10 reps;Right;Seated Wrist Extension: AROM;10 reps;Right;Seated Digit Composite Flexion: AROM;10 reps;Right;Seated Composite Extension: AROM;10 reps;Right;Seated Hand Exercises Forearm Supination: AROM;10 reps;Right;Seated Forearm Pronation: AROM;10 reps;Right;Seated Other Exercises Other Exercises: lap slides, seated,  RUE, x5 reps    Shoulder Instructions Shoulder Instructions Donning/doffing shirt without moving shoulder: Maximal assistance Method for sponge bathing under operated UE: Minimal assistance Donning/doffing sling/immobilizer: Maximal assistance Correct positioning of sling/immobilizer: Moderate assistance ROM for elbow, wrist and digits of operated UE: Minimal assistance Sling wearing schedule (on at all times/off for ADL's): Supervision/safety Proper positioning of operated UE when showering: Supervision/safety Positioning of UE while sleeping: Minimal assistance    Home Living Family/patient expects to be discharged to:: Private residence Living Arrangements: Spouse/significant other;Children;Other relatives(sister staying to assist initially ) Available Help at Discharge: Family;Available 24 hours/day Type of Home: House Home Access: Stairs to enter [employer] of Steps: 2  Entrance Stairs-Rails: Right;Left Home Layout: One level     Bathroom Shower/Tub: Occupational psychologist: Standard     Home Equipment: Shower seat - built in          Prior Functioning/Environment Level of Independence: Independent                 OT Problem List: Decreased strength;Impaired balance (sitting and/or standing);Decreased knowledge of precautions;Pain;Decreased activity tolerance;Impaired UE functional use      OT Treatment/Interventions: Self-care/ADL training;DME and/or AE instruction;Therapeutic activities;Balance training;Therapeutic exercise;Patient/family education    OT Goals(Current goals can be found in the care plan section) Acute Rehab OT Goals Patient Stated Goal: less pain OT Goal Formulation: With patient Time For Goal Achievement: 08/19/17 Potential to Achieve Goals: Good ADL Goals Pt Will Perform Upper Body Dressing: (P) with min assist;with caregiver independent in assisting;sitting Pt Will Perform Lower Body Dressing: (P) with min  assist;with caregiver independent in assisting;sit to/from stand Pt  Will Transfer to Toilet: (P) with min guard assist;ambulating;bedside commode(over toilet)  OT Frequency: Min 3X/week   Barriers to D/C:            Co-evaluation              AM-PAC PT "6 Clicks" Daily Activity     Outcome Measure Help from another person eating meals?: A Little Help from another person taking care of personal grooming?: A Little Help from another person toileting, which includes using toliet, bedpan, or urinal?: A Lot Help from another person bathing (including washing, rinsing, drying)?: A Lot Help from another person to put on and taking off regular upper body clothing?: A Lot Help from another person to put on and taking off regular lower body clothing?: A Lot 6 Click Score: 14   End of Session Equipment Utilized During Treatment: Other (comment);Gait belt(sling ) Nurse Communication: Mobility status  Activity Tolerance: Patient tolerated treatment well;Patient limited by pain;Other (comment)(limited by dizziness ) Patient left: in bed;with call bell/phone within reach;with family/visitor present  OT Visit Diagnosis: Unsteadiness on feet (R26.81);Muscle weakness (generalized) (M62.81);Pain Pain - Right/Left: Right Pain - part of body: Shoulder                Time: 4327-6147 OT Time Calculation (min): 50 min Charges:  OT General Charges $OT Visit: 1 Visit OT Evaluation $OT Eval Moderate Complexity: 1 Mod OT Treatments $Self Care/Home Management : 23-37 mins G-Codes:     Lou Cal, OT Pager [phone] 08/05/2017   Raymondo Band 08/05/2017, 1:06 PM

## 2017-08-05 NOTE — Progress Notes (Signed)
Occupational Therapy Treatment Patient Details Name: Lorraine Gilbert MRN: [id number] DOB: 1932-05-16 Today's Date: 08/05/2017    History of present illness Pt is an 84 y/o female now s/p reverse R TSA. PMHx includes a-fib, DM, HTN, vertigo, anxiety, RA   OT comments  Pt with improved tolerance with OOB mobility and activity this session. Pt completing UB/LB ADLs with overall mod-maxA with min cues for maintaining shoulder precautions; completing room level mobility with minA and no AD. Progressed to completion of standing dangle and gentle pendulums this session. Pt requiring max cues for safe completion of pendulums during instruction. Pt's daughter and spouse also present and engaged during session/education and pt/family verbalizing understanding of education throughout. Further reviewed shoulder precautions, safety and compensatory strategies for completing ADLs at home. Questions answered throughout. Pt will have 24hr assist from sister after return home. Feel pt is safe to discharge home from OT standpoint and follow up as recommended per MD given available family assist. Anticipating d/c home later today.    Follow Up Recommendations  Follow surgeon's recommendation for DC plan and follow-up therapies;Supervision/Assistance - 24 hour    Equipment Recommendations  None recommended by OT          Precautions / Restrictions Precautions Precautions: Shoulder Type of Shoulder Precautions: sling at all times except ADL/exercise; NWB UE, AROM e/w/h to tolerance; no resisted internal rotation, no exercises for internal rotation; okay to use operative UE for ADLs within follow range: ER 0-20, ABD 0-45, FE 0-60; okay for pendulums and lap slides  Shoulder Interventions: Shoulder sling/immobilizer;At all times;Off for dressing/bathing/exercises Precaution Booklet Issued: Yes (comment) Precaution Comments: reviewed with pt and pt's family  Required Braces or Orthoses: Sling Restrictions Weight  Bearing Restrictions: Yes RUE Weight Bearing: Non weight bearing       Mobility Bed Mobility Overal bed mobility: Needs Assistance Bed Mobility: Supine to Sit     Supine to sit: Min assist Sit to supine: Min assist   General bed mobility comments: assist to guide and support trunk upright increased time and effort and multiple attempts due to pain, HOB slightly elevated   Transfers Overall transfer level: Needs assistance Equipment used: 1 person hand held assist;None Transfers: Sit to/from Stand Sit to Stand: Min guard;Min assist         General transfer comment: assist to rise and stabilize in standing     Balance Overall balance assessment: Needs assistance Sitting-balance support: Feet supported Sitting balance-Leahy Scale: Good     Standing balance support: Single extremity supported;No upper extremity supported Standing balance-Leahy Scale: Fair Standing balance comment: reliant on UE support and external support from therapist                            ADL either performed or assessed with clinical judgement   ADL Overall ADL's : Needs assistance/impaired Eating/Feeding: Set up;Sitting   Grooming: Set up;Min guard;Sitting   Upper Body Bathing: Minimal assistance;Sitting   Lower Body Bathing: Moderate assistance;Sit to/from stand   Upper Body Dressing : Maximal assistance;Sitting Upper Body Dressing Details (indicate cue type and reason): donning button up gown and sling Lower Body Dressing: Sit to/from stand;Maximal assistance Lower Body Dressing Details (indicate cue type and reason): assist to thread LEs into underwear; pt able to advance underwear over hips with minA for standing balance  Toilet Transfer: Minimal assistance;Ambulation;Regular Toilet   Toileting- Clothing Manipulation and Hygiene: Minimal assistance;Sit to/from stand       Functional  mobility during ADLs: Minimal assistance General ADL Comments: pt with improved  activity tolerance to OOB this session; completed UB/LB dressing and reviewed shoulder protocol/exercises; pt's daughter present for education      Vision       Perception     Praxis      Cognition Arousal/Alertness: Awake/alert Behavior During Therapy: WFL for tasks assessed/performed Overall Cognitive Status: Within Functional Limits for tasks assessed                                          Exercises Shoulder Exercises Pendulum Exercise: Limitations;PROM Pendulum Limitations: progressed to standing dangle; max cues for safety and technique during attempts at pendulums with pt completing few reps Elbow Flexion: AAROM;5 reps;Seated;Right Elbow Extension: AAROM;5 reps;Right;Seated Wrist Flexion: AROM;10 reps;Right;Seated Wrist Extension: AROM;10 reps;Right;Seated Digit Composite Flexion: AROM;10 reps;Right;Seated Composite Extension: AROM;10 reps;Right;Seated Neck Flexion: (educated in neck exercises ) Hand Exercises Forearm Supination: AROM;10 reps;Right;Seated Forearm Pronation: AROM;10 reps;Right;Seated Other Exercises Other Exercises: lap slides, seated, RUE, x5 reps    Shoulder Instructions Shoulder Instructions Donning/doffing shirt without moving shoulder: Maximal assistance;Patient able to independently direct caregiver;Caregiver independent with task Method for sponge bathing under operated UE: Minimal assistance;Caregiver independent with task;Patient able to independently direct caregiver Donning/doffing sling/immobilizer: Maximal assistance;Caregiver independent with task;Patient able to independently direct caregiver Correct positioning of sling/immobilizer: Moderate assistance;Patient able to independently direct caregiver;Caregiver independent with task Pendulum exercises (written home exercise program): Moderate assistance;Caregiver independent with task ROM for elbow, wrist and digits of operated UE: Minimal assistance Sling wearing schedule  (on at all times/off for ADL's): Supervision/safety Proper positioning of operated UE when showering: Supervision/safety Positioning of UE while sleeping: Minimal assistance;Caregiver independent with task;Patient able to independently direct caregiver     General Comments pt's spouse present during session    Pertinent Vitals/ Pain       Pain Assessment: Faces Faces Pain Scale: Hurts even more Pain Location: RUE with certain movements  Pain Descriptors / Indicators: Grimacing;Guarding;Sharp Pain Intervention(s): Monitored during session;Repositioned  Home Living                                          Prior Functioning/Environment              Frequency  Min 3X/week        Progress Toward Goals  OT Goals(current goals can now be found in the care plan section)  Progress towards OT goals: Progressing toward goals  Acute Rehab OT Goals Patient Stated Goal: less pain OT Goal Formulation: With patient Time For Goal Achievement: 08/19/17 Potential to Achieve Goals: Good ADL Goals Pt Will Perform Upper Body Dressing: with min assist;with caregiver independent in assisting;sitting Pt Will Perform Lower Body Dressing: with min assist;with caregiver independent in assisting;sit to/from stand Pt Will Transfer to Toilet: with min guard assist;ambulating;bedside commode(over toilet) Pt/caregiver will Perform Home Exercise Program: Right Upper extremity;With Supervision;With written HEP provided  Plan Discharge plan remains appropriate    Co-evaluation                 AM-PAC PT "6 Clicks" Daily Activity     Outcome Measure   Help from another person eating meals?: A Little Help from another person taking care of personal grooming?: A Little Help from another person toileting, which  includes using toliet, bedpan, or urinal?: A Lot Help from another person bathing (including washing, rinsing, drying)?: A Lot Help from another person to put on and  taking off regular upper body clothing?: A Lot Help from another person to put on and taking off regular lower body clothing?: A Lot 6 Click Score: 14    End of Session Equipment Utilized During Treatment: (sling )  OT Visit Diagnosis: Unsteadiness on feet (R26.81);Muscle weakness (generalized) (M62.81);Pain Pain - Right/Left: Right Pain - part of body: Shoulder   Activity Tolerance Patient tolerated treatment well   Patient Left in chair;with call bell/phone within reach;with family/visitor present   Nurse Communication Mobility status        Time: 1610-9604 OT Time Calculation (min): 33 min  Charges: OT General Charges $OT Visit: 1 Visit OT Evaluation $OT Eval Moderate Complexity: 1 Mod OT Treatments $Self Care/Home Management : 23-37 mins  Lou Cal, OT Pager [phone] 08/05/2017    Raymondo Band 08/05/2017, 3:42 PM

## 2017-08-05 NOTE — Discharge Summary (Signed)
PATIENT ID:      Lorraine Gilbert  MRN:     [id number] DOB/AGE:    June 29, 1932 / 84 y.o.     DISCHARGE SUMMARY  ADMISSION DATE:    08/04/2017 DISCHARGE DATE:    ADMISSION DIAGNOSIS: right shoulder rotator cuff tear arthropathy Past Medical History:  Diagnosis Date  . A-fib ([place])   . Abnormal heart rhythm   . Allergic rhinitis   . Anxiety   . Asthma   . Diabetes mellitus without complication ([place])    Type II  . GERD (gastroesophageal reflux disease)   . Headache   . Hypertension   . Osteopenia   . Rheumatoid arthritis ([place])   . Rotator cuff tear arthropathy    Right  . Vertigo     DISCHARGE DIAGNOSIS:   Active Problems:   S/p reverse total shoulder arthroplasty   PROCEDURE: Procedure(s): RIGHT REVERSE SHOULDER ARTHROPLASTY on 08/04/2017  CONSULTS:    HISTORY:  See H&P in chart.  HOSPITAL COURSE:  Lorraine Gilbert is a 84 y.o. admitted on 08/04/2017 with a diagnosis of right shoulder rotator cuff tear arthropathy.  They were brought to the operating room on 08/04/2017 and underwent Procedure(s): RIGHT REVERSE SHOULDER ARTHROPLASTY.    They were given perioperative antibiotics:  Anti-infectives (From admission, onward)   Start     Dose/Rate Route Frequency Ordered Stop   08/04/17 0600  ceFAZolin (ANCEF) IVPB 2g/100 mL premix  Status:  Discontinued     2 g 200 mL/hr over 30 Minutes Intravenous On call to O.R. 08/04/17 0548 08/04/17 1343   08/04/17 0600  ceFAZolin (ANCEF) 3 g in dextrose 5 % 50 mL IVPB     3 g 100 mL/hr over 30 Minutes Intravenous To Short Stay 08/03/17 1326 08/04/17 0753    .  Patient underwent the above named procedure and tolerated it well. The following day they were hemodynamically stable and pain was controlled on oral analgesics. She did have a fair amount of pain the first night They were neurovascularly intact to the operative extremity. OT was ordered and worked with patient per protocol. They were medically and orthopaedically stable for discharge on  day 1 .    DIAGNOSTIC STUDIES:  RECENT RADIOGRAPHIC STUDIES :  No results found.  RECENT VITAL SIGNS:   Patient Vitals for the past 24 hrs:  BP Temp Temp src Pulse Resp SpO2 Height Weight  08/05/17 0523 (!) 166/47 98.2 F (36.8 C) Oral - 19 98 % - -  08/04/17 2129 (!) 151/53 98.1 F (36.7 C) Oral 65 16 98 % - -  08/04/17 1811 (!) 141/48 98.1 F (36.7 C) Oral 63 16 98 % - -  08/04/17 1349 (!) 129/32 98 F (36.7 C) Oral 69 17 97 % 5\' 3"  (1.6 m) 64.5 kg (142 lb 3.2 oz)  08/04/17 1315 (!) 146/50 - - 74 15 99 % - -  08/04/17 1258 - - - 62 17 96 % - -  08/04/17 1230 (!) 138/47 - - 87 19 98 % - -  08/04/17 1145 (!) 148/50 - - 61 17 94 % - -  08/04/17 1100 (!) 150/50 - - 75 20 95 % - -  08/04/17 1030 (!) 162/57 - - 71 20 94 % - -  08/04/17 1015 (!) 166/59 - - 78 (!) 22 96 % - -  08/04/17 1000 (!) 167/67 - - 88 20 98 % - -  08/04/17 0945 (!) 162/57 - - 86 18 98 % - -  08/04/17 0935 (!) 162/56 (!) 97.3 F (36.3 C) - 88 15 99 % - -  .  RECENT EKG RESULTS:   No orders found for this or any previous visit.  DISCHARGE INSTRUCTIONS:    DISCHARGE MEDICATIONS:   Allergies as of 08/05/2017      Reactions   Ibuprofen Other (See Comments)   Confusion   Morphine Rash, Other (See Comments)   Patient says it makes her feel like she is flying.       Medication List    TAKE these medications   aspirin 81 MG tablet Take 81 mg by mouth every morning.   digoxin 0.125 MG tablet Commonly known as:  LANOXIN Take 125 mcg by mouth every morning.   diltiazem 240 MG 24 hr capsule Commonly known as:  CARDIZEM CD Take 240 mg by mouth daily.   donepezil 10 MG tablet Commonly known as:  ARICEPT Take 1/2 tablet daily for 1 month, then increase to 1 tablet daily   dorzolamide 2 % ophthalmic solution Commonly known as:  TRUSOPT Place 1 drop into both eyes 2 (two) times daily.   ELIQUIS 5 MG Tabs tablet Generic drug:  apixaban Take 5 mg by mouth 2 (two) times daily.   escitalopram 20 MG  tablet Commonly known as:  LEXAPRO Take 20 mg by mouth daily.   folic acid 1 MG tablet Commonly known as:  FOLVITE Take 1 mg by mouth every morning.   furosemide 20 MG tablet Commonly known as:  LASIX Take 20 mg by mouth daily as needed for fluid.   latanoprost 0.005 % ophthalmic solution Commonly known as:  XALATAN Place 1 drop into both eyes at bedtime.   ondansetron 4 MG tablet Commonly known as:  ZOFRAN Take 1 tablet (4 mg total) by mouth every 8 (eight) hours as needed for nausea or vomiting.   oxyCODONE-acetaminophen 5-325 MG tablet Commonly known as:  PERCOCET Take 1 tablet by mouth every 4 (four) hours as needed (max 6 q).   pantoprazole 20 MG tablet Commonly known as:  PROTONIX Take 40 mg by mouth daily before breakfast.   predniSONE 5 MG tablet Commonly known as:  DELTASONE Take 10 mg by mouth daily with breakfast.   timolol 0.5 % ophthalmic solution Commonly known as:  BETIMOL Place 1 drop into both eyes daily.   tiZANidine 4 MG capsule Commonly known as:  ZANAFLEX Take 1 capsule (4 mg total) by mouth 3 (three) times daily as needed for muscle spasms.   vitamin C 500 MG tablet Commonly known as:  ASCORBIC ACID Take 500 mg by mouth daily.   Vitamin D3 50000 units Caps Take 50,000 Units by mouth every Wednesday.       FOLLOW UP VISIT:   Follow-up Information    Justice Britain, MD.   Specialty:  Orthopedic Surgery Why:  call to be seen in 10-14 days Contact information: [address] [place] [address] [phone]           DISCHARGE SE:GBTD   DISCHARGE CONDITION:  Lorraine Gilbert for Dr. Justice Britain 08/05/2017, 8:25 AM

## 2017-08-05 NOTE — Progress Notes (Signed)
Pt d/c home per MD order, pt tol well, pt family at [facility], D/c instructions given, pt verbalized understanding, all questions answered

## 2018-01-05 ENCOUNTER — Ambulatory Visit: Payer: Medicare Other | Admitting: Internal Medicine

## 2018-01-05 ENCOUNTER — Encounter: Payer: Self-pay | Admitting: Internal Medicine

## 2018-01-05 ENCOUNTER — Telehealth: Payer: Self-pay | Admitting: Internal Medicine

## 2018-01-05 VITALS — BP 148/36 | HR 69 | Ht 63.0 in | Wt 134.0 lb

## 2018-01-05 DIAGNOSIS — J45991 Cough variant asthma: Secondary | ICD-10-CM

## 2018-01-05 DIAGNOSIS — R05 Cough: Secondary | ICD-10-CM | POA: Diagnosis not present

## 2018-01-05 DIAGNOSIS — R058 Other specified cough: Secondary | ICD-10-CM

## 2018-01-05 MED ORDER — PANTOPRAZOLE SODIUM 40 MG PO TBEC: DELAYED_RELEASE_TABLET | ORAL | 2 refills | Status: DC

## 2018-01-05 MED ORDER — PREDNISONE 10 MG PO TABS: ORAL_TABLET | ORAL | 0 refills | Status: DC

## 2018-01-05 NOTE — Telephone Encounter (Signed)
[facility] pharmacy-phone continued to ring and hang up. Will need to try again later.

## 2018-01-05 NOTE — Patient Instructions (Addendum)
Prednisone 10 mg take two each am until 100 % better then 1 daily x 5 days and stop   Change pantoprazole to 40 mg (2 x 20)  Take 30- 60 min before your first and last meals of the day   GERD (REFLUX)  is an extremely common cause of respiratory symptoms just like yours , many times with no obvious heartburn at all.    It can be treated with medication, but also with lifestyle changes including elevation of the head of your bed (ideally with 6-8  inch  bed blocks),  Smoking cessation, avoidance of late meals, excessive alcohol, and avoid fatty foods, chocolate, peppermint, colas, red wine, and acidic juices such as orange juice.  NO MINT OR MENTHOL PRODUCTS SO NO COUGH DROPS   USE SUGARLESS CANDY INSTEAD (Jolley ranchers or Stover's or Life Savers) or even ice chips will also do - the key is to swallow to prevent all throat clearing. NO OIL BASED VITAMINS - use powdered substitutes.  For drainage / throat tickle try take CHLORPHENIRAMINE  4 mg (chlortab 4 mg at [facility]) - take one every 4 hours as needed - available over the counter- may cause drowsiness so start with just   dose or two  An hour before bedtime  and see how you tolerate it before trying in daytime      Please schedule a follow up office visit in 3  weeks, sooner if needed  with all medications /inhalers/ solutions in hand so we can verify exactly what you are taking. This includes all medications from all doctors and over the counters

## 2018-01-05 NOTE — Progress Notes (Signed)
Subjective:   Patient ID: Lorraine Gilbert, female    DOB: Nov 09, 1932  MRN: [id number]        Brief patient profile:  33 yf from [place] never smoker with cough and sob x decades previously eval by Dr Bernita Buffy referred by Dr Moreen Fowler 07/24/2012 for further evaluation of sob and cough with perfectly nl pfts 12/04/2013       History of Present Illness  07/24/2012 1st pulmonary eval cc cough x 2 months indolent onset progressively worse that is not better on advair, min prod of white mucus more day than night, assoc with mild sob. rec Stop advair Start dulera 100 Take 2 puffs first thing in am and then another 2 puffs about 12 hours later.  Pantoprazole (protonix) 40 mg  Take 30-60 min before first meal of the day and Pepcid 20 mg one bedtime until return to office - this is the best way to tell whether stomach acid is contributing to your problem.   GERD   If not better return here in 2 weeks all bottles/inhalers from all doctors or return to [place] and if you want to change to [facility] we will need you to bring your records with you as well >> did not return as rec     04/24/2015  f/u ov/Lorraine Gilbert re: chronic cough with nl spirometry and NO / off dulera ? Since when and on ppi ?80 mg before bfast? pepcid at hs  Chief Complaint  Patient presents with  . Follow-up    Increased cough x 5 wks- esp worse at night and occ prod with min, clear sputum- "feels like it gets stuck in my throat".  She has occ wheezing and chest tightness.   did not get the chlorpheniramine yet / very easily confused with details of care/ meds/ names/ refills   Not limited by breathing from desired activities  But rather by arthritis and fatigue  For drainage / throat tickle try take CHLORPHENIRAMINE  4 mg - take one every 4 hours as needed -   Change the protonix to Take 30- 60 min before your first and last meals of the day  GERD better. If not better restart dulera 100 Take 2 puffs first thing in am and  then another 2 puffs about 12 hours later.      . 08/08/2015  f/u ov/Lorraine Gilbert re: chronic cough/? Cough variant asthma/ not using dulera regularly / now also on MTX/ no med calendar Chief Complaint  Patient presents with  . Follow-up    Pt states that her cough is much improved. She has had some increased SOB which she relates to humid weather. No new co's today.    hfa about 50% effective/ Not limited by breathing from desired activities  rec Work on inhaler technique:  relax and gently blow all the way out then take a nice smooth deep breath back in, triggering the inhaler at same time you start breathing in.  Hold for up to 5 seconds if you can. Blow out thru nose. Rinse and gargle with water when done [facility] to change dulera 100 to where only as needed for cough or short of breath > 2 puffs first thing in am and then another 2 puffs about 12 hours later if needed  Please schedule a follow up visit in 6 months but call sooner if needed         11/15/2016  Acute extended  ov/Lorraine Gilbert re: evolving chronic cough  Chief Complaint  Patient presents with  . Acute Visit    Pt c/o cough for the pasgt 3-4 wks- prod with clear sputum.     did not add prns as above/ still taking dulera 100 2bid  Cough and then mtx added definitely after onset of cough    Off dulera x 2 months  Still using dulera samples prn/ not on maint rx  Really Not limited by breathing from desired activities  But very inactive due to arthritis  Cough is worse day than noct rec Symbicort 80 Take 2 puffs first thing in am and then another 2 puffs about 12 hours later  Work on inhaler technique:  relax and gently blow all the way out then take a nice smooth deep breath back in, triggering the inhaler at same time you start breathing in.  Hold for up to 5 seconds if you can. Blow out thru nose. Rinse and gargle with water when done Whenever coughing>>   add pepcid 20 mg at bedtime For drainage / throat tickle >>   take  CHLORPHENIRAMINE  4 mg - take one every 4 hours as needed  Please schedule a follow up office visit in 6 weeks, call sooner if needed with pfts on return and bring all your medications    01/06/2017  f/u ov/Lorraine Gilbert re: cough > sob  Did not bring meds/arthritis worse than usual / no better on symb but hfa quite poor  Chief Complaint  Patient presents with  . Follow-up    PFT's done today. Breathing is unchanged.   cough is most bothersome w/in 15 min of hs / then sleeps ok after that  Not limited by breathing from desired activities  But by arthritis  rec Add pepcid ac 20 mg about an hour before bedtime For drainage / throat tickle try take CHLORPHENIRAMINE  4 mg  GERD   If not satisfied with your breathing or coughing, return with all medications / inhalers  In hand     NP 05/03/17  Begin Delsym cough syrup over-the-counter 2 teaspoons twice daily for cough Begin Zyrtec 10 mg at bedtime for sinus congestion and drainage Begin Tessalon 200 mg 3 times daily for cough as needed Saline nasal rinses twice daily for nasal congestion Begin Flonase 2 puffs daily, is over-the-counter Prednisone taper over the next week then resume 5 mg daily dosing Follow up with Dr. Melvyn Novas  In 6 weeks and As needed > did not return as req    01/05/2018  Acute extended ov/Lorraine Gilbert re:  Cough> sob/ thoroughly confused re details of care = > brought only one bottle of meds = tessalon "not working"  Cough/sob  worse esp hs since stopped prednisone x  2 m prior to OV   Dyspnea:  MMRC3 = can't walk 100 yards even at a slow pace at a flat grade s stopping due to sob   Cough: clear minimal  Sleeping: flat bed 2 pillows / worse cough at hs  SABA use: none 02: none   No obvious day to day or daytime variability or assoc excess/ purulent sputum or mucus plugs or hemoptysis or cp or chest tightness, subjective wheeze or overt sinus or hb symptoms.    Also denies any obvious fluctuation of symptoms with weather or  environmental changes or other aggravating or alleviating factors except as outlined above   No unusual exposure hx or h/o childhood pna/ asthma or knowledge of premature birth.  Current Allergies, Complete Past Medical History, Past Surgical History,  Family History, and Social History were reviewed in [employer] record.  ROS  The following are not active complaints unless bolded Hoarseness, sore throat, dysphagia, dental problems, itching, sneezing,  nasal congestion or discharge of excess mucus or purulent secretions, ear ache,   fever, chills, sweats, unintended wt loss or wt gain, classically pleuritic or exertional cp,  orthopnea pnd or arm/hand swelling  or leg swelling, presyncope, palpitations, abdominal pain, anorexia, nausea, vomiting, diarrhea  or change in bowel habits or change in bladder habits, change in stools or change in urine, dysuria, hematuria,  rash, arthralgias, visual complaints, headache, numbness, weakness or ataxia or problems with walking or coordination,  change in mood or  memory.        Current Meds  - - NOTE:   Unable to verify as accurately reflecting what pt takes     Medication Sig  . apixaban (ELIQUIS) 5 MG TABS tablet Take 5 mg by mouth 2 (two) times daily.  Marland Kitchen aspirin 81 MG tablet Take 81 mg by mouth every morning.   . Cholecalciferol (VITAMIN D3) 50000 units CAPS Take 50,000 Units by mouth every Wednesday.  . digoxin (LANOXIN) 0.125 MG tablet Take 125 mcg by mouth every morning.   . diltiazem (CARDIZEM CD) 240 MG 24 hr capsule Take 240 mg by mouth daily.  Marland Kitchen donepezil (ARICEPT) 10 MG tablet Take 1/2 tablet daily for 1 month, then increase to 1 tablet daily  . dorzolamide (TRUSOPT) 2 % ophthalmic solution Place 1 drop into both eyes 2 (two) times daily.  Marland Kitchen escitalopram (LEXAPRO) 20 MG tablet Take 20 mg by mouth daily.   . folic acid (FOLVITE) 1 MG tablet Take 1 mg by mouth every morning.   . furosemide (LASIX) 20 MG tablet Take 20 mg  by mouth daily as needed for fluid.  Marland Kitchen latanoprost (XALATAN) 0.005 % ophthalmic solution Place 1 drop into both eyes at bedtime.  . ondansetron (ZOFRAN) 4 MG tablet Take 1 tablet (4 mg total) by mouth every 8 (eight) hours as needed for nausea or vomiting.  . timolol (BETIMOL) 0.5 % ophthalmic solution Place 1 drop into both eyes daily.   Marland Kitchen tiZANidine (ZANAFLEX) 4 MG capsule Take 1 capsule (4 mg total) by mouth 3 (three) times daily as needed for muscle spasms.  . vitamin C (ASCORBIC ACID) 500 MG tablet Take 500 mg by mouth daily.  . [  benzonatate (TESSALON) 100 MG capsule Take 100 mg by mouth 3 (three) times daily as needed for cough.  . [  pantoprazole (PROTONIX) 20 MG tablet Take 40 mg by mouth daily before breakfast.                  Objective:   Physical Exam  Somber amb pr female nad  / min dry mostly upper airway coughing noted  08/14/2014        138 >  04/24/2015   137> 08/08/2015  136 > 02/09/2016  132 >  11/15/2016    137 > 01/06/2017  133 > 01/05/2018   134   Vital signs reviewed - Note on arrival 02 sats  98% on RA    HEENT: nl dentition, turbinates bilaterally, and oropharynx. Nl external ear canals without cough reflex   NECK :  without JVD/Nodes/TM/ nl carotid upstrokes bilaterally   LUNGS: no acc muscle use,  Nl contour chest which is clear to A and P bilaterally without cough on insp or exp maneuvers   CV:  RRR  no s3 or murmur or increase in P2, and no edema   ABD:  soft and nontender with nl inspiratory excursion in the supine position. No bruits or organomegaly appreciated, bowel sounds nl  MS:  Nl gait/ ext warm without deformities, calf tenderness, cyanosis or clubbing No obvious joint restrictions   SKIN: warm and dry without lesions    NEURO:  alert, approp, nl sensorium with  no motor or cerebellar deficits apparent.           Assessment & Plan:

## 2018-01-06 ENCOUNTER — Encounter: Payer: Self-pay | Admitting: Internal Medicine

## 2018-01-06 NOTE — Telephone Encounter (Signed)
Per MW patients instructions are listed below as on the AVS.  Instructions   Prednisone 10 mg take two each am until 100 % better then 1 daily x 5 days and stop      _________________________________________________________________________________________  [facility] pharmacy and spoke with Caryl Pina advised her of response above, ashley verbalized understanding. Nothing further needed.

## 2018-01-06 NOTE — Assessment & Plan Note (Addendum)
-   Note POS GERD on UGI  02/28/13     Despite flare of cough /sob off prednisone she has very little  evidence of asthma so still strongly favor dx of Upper airway cough syndrome (previously labeled PNDS),  is so named because it's frequently impossible to sort out how much is  CR/sinusitis with freq throat clearing (which can be related to primary GERD)   vs  causing  secondary (" extra esophageal")  GERD from wide swings in gastric pressure that occur with throat clearing, often  promoting self use of mint and menthol lozenges that reduce the lower esophageal sphincter tone and exacerbate the problem further in a cyclical fashion.   These are the same pts (now being labeled as having "irritable larynx syndrome" by some cough centers) who not infrequently have a history of having failed to tolerate ace inhibitors,  dry powder inhalers or biphosphonates or report having atypical/extraesophageal reflux symptoms that don't respond to standard doses of PPI  and are easily confused as having aecopd or asthma flares by even experienced allergists/ pulmonologists (myself included).    Of the three most common causes of  Sub-acute / recurrent or chronic cough, only one (GERD, which we know she has from above study)  can actually contribute to/ trigger  the other two (asthma and post nasal drip syndrome)  and perpetuate the cylce of cough.  While not intuitively obvious, many patients with chronic low grade reflux do not cough until there is a primary insult that disturbs the protective epithelial barrier and exposes sensitive nerve endings.   This is typically viral but can due to PNDS and  either may apply here.      >>> The point is that once this occurs, it is difficult to eliminate the cycle  using anything but a maximally effective acid suppression regimen at least in the short run, accompanied by an appropriate diet to address non acid GERD and control / eliminate pnds with 1st gen H1 blockers per  guidelines  Then return to regroup with all meds in hand using a trust but verify approach to confirm accurate Medication  Reconciliation The principal here is that until we are certain that the  patients are doing what we've asked, it makes no sense to ask them to do more and advised we can't work with her safely going forward without her meeting [place] half way in terms of med reconciliation.   I had an extended discussion with the patient reviewing all relevant studies completed to date and  lasting 25 minutes of a 40  minute acute office visit to re-establish with me   re  severe non-specific but potentially very serious refractory respiratory symptoms of uncertain and potentially multiple  etiologies.   Each maintenance medication was reviewed in detail including most importantly the difference between maintenance and prns and under what circumstances the prns are to be triggered using an action plan format that is not reflected in the computer generated alphabetically organized AVS.    Please see AVS for specific instructions unique to this office visit that I personally wrote and verbalized to the the pt in detail and then reviewed with pt  by my nurse highlighting any changes in therapy/plan of care  recommended at today's visit.

## 2018-01-06 NOTE — Telephone Encounter (Signed)
Call made to pharmacy was placed on hold for over 10 minutes, they need clarification of prednisone order. Per pharmacy we cannot send prednisone orders that state Take as directed we must specify dose and frequency.   MW please advise of prednisone order. Thanks.

## 2018-01-06 NOTE — Assessment & Plan Note (Addendum)
-   10/18/2013  resume trial of dulera 100 2bid  -Med calendar 11/01/2013 > did not bring as instructed 12/04/13 - PFTs wnl 12/04/13 > ok to try off dulera - flare off dulera 08/14/14 > ok to resume dulera 100 2bid  - Spirometry 04/24/2015  wnl off dulera / even fef 25-75 - NO 04/24/2015  = 17  - 11/15/2016  try symbicort 80 2bid  - PFT's  01/06/2017  FEV1 1.59 (101 % ) ratio 86  p 4 % improvement from saba p symb 80 x 2 x 12h  prior to study with DLCO  67/68c % corrects to 100 % for alv volume   - 01/06/2017  After extensive coaching inhaler device  effectiveness =  10% so no need to use inhalers   Not clear at all this is asthma but symptoms did worsen off prednisone and on Timolol eyedrops so  reasonable to restart low doses until return to regroup p holidays.  The goal with a chronic steroid dependent illness is always arriving at the lowest effective dose that controls the disease/symptoms and not accepting a set "formula" which is based on statistics or guidelines that don't always take into account patient  variability or the natural hx of the dz in every individual patient, which may well vary over time.  For now therefore I recommend the patient maintain  20 mg daily until 100% better then 10 mg daily  X  5 days and stop but if not all better just continue the 20 mg daily until return    .

## 2018-01-06 NOTE — Telephone Encounter (Signed)
instructions are on the avs - just copy and paste

## 2018-01-18 DIAGNOSIS — C801 Malignant (primary) neoplasm, unspecified: Secondary | ICD-10-CM

## 2018-01-18 HISTORY — DX: Malignant (primary) neoplasm, unspecified: C80.1

## 2018-01-22 DIAGNOSIS — Z7982 Long term (current) use of aspirin: Secondary | ICD-10-CM | POA: Diagnosis not present

## 2018-01-22 DIAGNOSIS — M5489 Other dorsalgia: Secondary | ICD-10-CM | POA: Diagnosis not present

## 2018-01-22 DIAGNOSIS — I959 Hypotension, unspecified: Secondary | ICD-10-CM | POA: Diagnosis not present

## 2018-01-22 DIAGNOSIS — Z041 Encounter for examination and observation following transport accident: Secondary | ICD-10-CM | POA: Diagnosis not present

## 2018-01-22 DIAGNOSIS — Z886 Allergy status to analgesic agent status: Secondary | ICD-10-CM | POA: Diagnosis not present

## 2018-01-22 DIAGNOSIS — M47896 Other spondylosis, lumbar region: Secondary | ICD-10-CM | POA: Diagnosis not present

## 2018-01-22 DIAGNOSIS — G8911 Acute pain due to trauma: Secondary | ICD-10-CM | POA: Diagnosis not present

## 2018-01-22 DIAGNOSIS — Z7901 Long term (current) use of anticoagulants: Secondary | ICD-10-CM | POA: Diagnosis not present

## 2018-01-22 DIAGNOSIS — G319 Degenerative disease of nervous system, unspecified: Secondary | ICD-10-CM | POA: Diagnosis not present

## 2018-01-22 DIAGNOSIS — S0990XA Unspecified injury of head, initial encounter: Secondary | ICD-10-CM | POA: Diagnosis not present

## 2018-01-22 DIAGNOSIS — R52 Pain, unspecified: Secondary | ICD-10-CM | POA: Diagnosis not present

## 2018-01-22 DIAGNOSIS — M47897 Other spondylosis, lumbosacral region: Secondary | ICD-10-CM | POA: Diagnosis not present

## 2018-01-22 DIAGNOSIS — M545 Low back pain: Secondary | ICD-10-CM | POA: Diagnosis not present

## 2018-01-22 DIAGNOSIS — R51 Headache: Secondary | ICD-10-CM | POA: Diagnosis not present

## 2018-01-22 DIAGNOSIS — I10 Essential (primary) hypertension: Secondary | ICD-10-CM | POA: Diagnosis not present

## 2018-01-22 DIAGNOSIS — M542 Cervicalgia: Secondary | ICD-10-CM | POA: Diagnosis not present

## 2018-01-22 DIAGNOSIS — M4186 Other forms of scoliosis, lumbar region: Secondary | ICD-10-CM | POA: Diagnosis not present

## 2018-01-22 DIAGNOSIS — Z79899 Other long term (current) drug therapy: Secondary | ICD-10-CM | POA: Diagnosis not present

## 2018-01-24 DIAGNOSIS — H10413 Chronic giant papillary conjunctivitis, bilateral: Secondary | ICD-10-CM | POA: Diagnosis not present

## 2018-01-24 DIAGNOSIS — H04123 Dry eye syndrome of bilateral lacrimal glands: Secondary | ICD-10-CM | POA: Diagnosis not present

## 2018-01-24 DIAGNOSIS — H401131 Primary open-angle glaucoma, bilateral, mild stage: Secondary | ICD-10-CM | POA: Diagnosis not present

## 2018-01-24 DIAGNOSIS — E119 Type 2 diabetes mellitus without complications: Secondary | ICD-10-CM | POA: Diagnosis not present

## 2018-01-24 DIAGNOSIS — Z961 Presence of intraocular lens: Secondary | ICD-10-CM | POA: Diagnosis not present

## 2018-01-24 DIAGNOSIS — H353131 Nonexudative age-related macular degeneration, bilateral, early dry stage: Secondary | ICD-10-CM | POA: Diagnosis not present

## 2018-01-27 ENCOUNTER — Ambulatory Visit: Payer: Medicare Other | Admitting: Internal Medicine

## 2018-01-27 ENCOUNTER — Encounter: Payer: Self-pay | Admitting: Internal Medicine

## 2018-01-27 ENCOUNTER — Ambulatory Visit (INDEPENDENT_AMBULATORY_CARE_PROVIDER_SITE_OTHER): Payer: Medicare HMO | Admitting: Internal Medicine

## 2018-01-27 VITALS — BP 122/44 | HR 60 | Ht 63.0 in | Wt 133.0 lb

## 2018-01-27 DIAGNOSIS — J45991 Cough variant asthma: Secondary | ICD-10-CM

## 2018-01-27 NOTE — Progress Notes (Signed)
Subjective:   Patient ID: Lorraine Gilbert, female    DOB: September 18, 1932  MRN: [id number]        Brief patient profile:  19 yf from [place]  never smoker with cough and sob x decades previously eval by Dr Bernita Buffy referred by Dr Moreen Fowler 07/24/2012 for further evaluation of sob and cough with perfectly nl pfts 12/04/2013   History of Present Illness  07/24/2012 1st pulmonary eval cc cough x 2 months indolent onset progressively worse that is not better on advair, min prod of white mucus more day than night, assoc with mild sob. rec Stop advair Start dulera 100 Take 2 puffs first thing in am and then another 2 puffs about 12 hours later.  Pantoprazole (protonix) 40 mg  Take 30-60 min before first meal of the day and Pepcid 20 mg one bedtime until return to office - this is the best way to tell whether stomach acid is contributing to your problem.   GERD   If not better return here in 2 weeks all bottles/inhalers from all doctors or return to [place] and if you want to change to [facility] we will need you to bring your records with you as well >> did not return as rec   11/15/2016  Acute extended  ov/Deyjah Kindel re: evolving chronic cough  Chief Complaint  Patient presents with  . Acute Visit    Pt c/o cough for the pasgt 3-4 wks- prod with clear sputum.     did not add prns as above/ still taking dulera 100 2bid  Cough and then mtx added definitely after onset of cough    Off dulera x 2 months  Still using dulera samples prn/ not on maint rx  Really Not limited by breathing from desired activities  But very inactive due to arthritis  Cough is worse day than noct rec Symbicort 80 Take 2 puffs first thing in am and then another 2 puffs about 12 hours later  Work on inhaler technique:  relax and gently blow all the way out then take a nice smooth deep breath back in, triggering the inhaler at same time you start breathing in.  Hold for up to 5 seconds if you can. Blow out thru nose. Rinse  and gargle with water when done Whenever coughing>>   add pepcid 20 mg at bedtime For drainage / throat tickle >>   take CHLORPHENIRAMINE  4 mg - take one every 4 hours as needed  Please schedule a follow up office visit in 6 weeks, call sooner if needed with pfts on return and bring all your medications    01/06/2017  f/u ov/Desyre Calma re: cough > sob  Did not bring meds/arthritis worse than usual / no better on symb but hfa quite poor  Chief Complaint  Patient presents with  . Follow-up    PFT's done today. Breathing is unchanged.   cough is most bothersome w/in 15 min of hs / then sleeps ok after that  Not limited by breathing from desired activities  But by arthritis  rec Add pepcid ac 20 mg about an hour before bedtime For drainage / throat tickle try take CHLORPHENIRAMINE  4 mg  GERD   If not satisfied with your breathing or coughing, return with all medications / inhalers  In hand     NP 05/03/17  Begin Delsym cough syrup over-the-counter 2 teaspoons twice daily for cough Begin Zyrtec 10 mg at bedtime for sinus congestion and drainage Begin Tessalon  200 mg 3 times daily for cough as needed Saline nasal rinses twice daily for nasal congestion Begin Flonase 2 puffs daily, is over-the-counter Prednisone taper over the next week then resume 5 mg daily dosing Follow up with Dr. Melvyn Novas  In 6 weeks and As needed > did not return as req    01/05/2018  Acute extended ov/Alija Riano re:  Cough> sob/ thoroughly confused re details of care = > brought only one bottle of meds = tessalon "not working"  Cough/sob  worse esp hs since stopped prednisone x  2 m prior to OV   Dyspnea:  MMRC3 = can't walk 100 yards even at a slow pace at a flat grade s stopping due to sob   Cough: clear minimal  Sleeping: flat bed 2 pillows / worse cough at hs  SABA use: none 02: none  rec Prednisone 10 mg take two each am until 100 % better then 1 daily x 5 days and stop  Change pantoprazole to 40 mg (2 x 20)  Take 30-  60 min before your first and last meals of the day  GERD   For drainage / throat tickle try take CHLORPHENIRAMINE  4 mg (chlortab 4 mg at [facility]) - take one every 4 hours as needed -     01/27/2018  f/u ov/Lizabeth Fellner re: uacs improved on 1st gen H1 blockers per guidelines / finally brought meds as req   Chief Complaint  Patient presents with  . Follow-up    Her breathing is unchanged. She states still having hoarseness. No new co's.    Dyspnea:  MMRC2 = can't walk a nl pace on a flat grade s sob but does fine slow and flat eg shopping fine  Cough: in am only a cough or two  then ok  The rest of the day  Sleeping: flat ok  SABA use: none  02:    No obvious day to day or daytime variability or assoc excess/ purulent sputum or mucus plugs or hemoptysis or cp or chest tightness, subjective wheeze or overt sinus or hb symptoms.   Sleeping fine now without nocturnal  or early am exacerbation  of respiratory  c/o's or need for noct saba. Also denies any obvious fluctuation of symptoms with weather or environmental changes or other aggravating or alleviating factors except as outlined above   No unusual exposure hx or h/o childhood pna/ asthma or knowledge of premature birth.  Current Allergies, Complete Past Medical History, Past Surgical History, Family History, and Social History were reviewed in [employer] record.  ROS  The following are not active complaints unless bolded Hoarseness, sore throat, dysphagia, dental problems, itching, sneezing,  nasal congestion or discharge of excess mucus or purulent secretions, ear ache,   fever, chills, sweats, unintended wt loss or wt gain, classically pleuritic or exertional cp,  orthopnea pnd or arm/hand swelling  or leg swelling, presyncope, palpitations, abdominal pain, anorexia, nausea, vomiting, diarrhea  or change in bowel habits or change in bladder habits, change in stools or change in urine, dysuria, hematuria,  rash,  arthralgias, visual complaints, headache, numbness, weakness or ataxia or problems with walking or coordination,  change in mood or  memory.        Current Meds  Medication Sig  . apixaban (ELIQUIS) 5 MG TABS tablet Take 5 mg by mouth 2 (two) times daily.  Marland Kitchen aspirin 81 MG tablet Take 81 mg by mouth every morning.   . Cholecalciferol (VITAMIN D3)  50000 units CAPS Take 50,000 Units by mouth every Wednesday.  . digoxin (LANOXIN) 0.125 MG tablet Take 125 mcg by mouth every morning.   . diltiazem (CARDIZEM CD) 240 MG 24 hr capsule Take 240 mg by mouth daily.  Marland Kitchen donepezil (ARICEPT) 10 MG tablet Take 1/2 tablet daily for 1 month, then increase to 1 tablet daily  . dorzolamide (TRUSOPT) 2 % ophthalmic solution Place 1 drop into both eyes 2 (two) times daily.  Marland Kitchen escitalopram (LEXAPRO) 20 MG tablet Take 20 mg by mouth daily.   . folic acid (FOLVITE) 1 MG tablet Take 1 mg by mouth every morning.   . furosemide (LASIX) 20 MG tablet Take 20 mg by mouth daily as needed for fluid.  Marland Kitchen latanoprost (XALATAN) 0.005 % ophthalmic solution Place 1 drop into both eyes at bedtime.  . methocarbamol (ROBAXIN) 500 MG tablet Take 1 tablet by mouth 2 (two) times daily as needed.  . ondansetron (ZOFRAN) 4 MG tablet Take 1 tablet (4 mg total) by mouth every 8 (eight) hours as needed for nausea or vomiting.  . pantoprazole (PROTONIX) 40 MG tablet Take 30- 60 min before your first and last meals of the day  . timolol (BETIMOL) 0.5 % ophthalmic solution Place 1 drop into both eyes daily.   Marland Kitchen tiZANidine (ZANAFLEX) 4 MG capsule Take 1 capsule (4 mg total) by mouth 3 (three) times daily as needed for muscle spasms.  . vitamin C (ASCORBIC ACID) 500 MG tablet Take 500 mg by mouth daily.                 Objective:  Physical Exam  Pleasant pr female mildly hoarse nad   08/14/2014        138 >  04/24/2015   137> 08/08/2015  136 > 02/09/2016  132 >  11/15/2016    137 > 01/06/2017  133 > 01/05/2018   134 > 01/27/2018   133    Vital signs reviewed - Note on arrival 02 sats  100% on RA   HEENT: nl dentition, and oropharynx. Nl external ear canals without cough reflex - mild bilateral non-specific turbinate edema     NECK :  without JVD/Nodes/TM/ nl carotid upstrokes bilaterally   LUNGS: no acc muscle use,  Nl contour chest which is clear to A and P bilaterally without cough on insp or exp maneuvers   CV:  RRR  no s3 or murmur or increase in P2, and no edema   ABD:  soft and nontender with nl inspiratory excursion in the supine position. No bruits or organomegaly appreciated, bowel sounds nl  MS:  Nl gait/ ext warm without deformities, calf tenderness, cyanosis or clubbing No obvious joint restrictions   SKIN: warm and dry without lesions    NEURO:  alert, approp, nl sensorium with  no motor or cerebellar deficits apparent.              Assessment & Plan:

## 2018-01-27 NOTE — Patient Instructions (Signed)
Continue For drainage / throat tickle try take CHLORPHENIRAMINE  4 mg - take one every 4 hours as needed - available over the counter- may cause drowsiness so start with just a bedtime dose or two and see how you tolerate it before trying in daytime      If you are satisfied with your treatment plan,  let your doctor know and he/she can either refill your medications or you can return here when your prescription runs out.     If in any way you are not 100% satisfied,  please tell us.  If 100% better, tell your friends!  Pulmonary follow up is as needed

## 2018-01-28 ENCOUNTER — Encounter: Payer: Self-pay | Admitting: Internal Medicine

## 2018-01-28 DIAGNOSIS — M5489 Other dorsalgia: Secondary | ICD-10-CM | POA: Diagnosis not present

## 2018-01-28 DIAGNOSIS — S301XXA Contusion of abdominal wall, initial encounter: Secondary | ICD-10-CM | POA: Diagnosis not present

## 2018-01-28 NOTE — Assessment & Plan Note (Signed)
-   10/18/2013  resume trial of dulera 100 2bid  -Med calendar 11/01/2013 > did not bring as instructed 12/04/13 - PFTs wnl 12/04/13 > ok to try off dulera - flare off dulera 08/14/14 > ok to resume dulera 100 2bid  - Spirometry 04/24/2015  wnl off dulera / even fef 25-75 - NO 04/24/2015  = 17  - 11/15/2016  try symbicort 80 2bid  - PFT's  01/06/2017  FEV1 1.59 (101 % ) ratio 86  p 4 % improvement from saba p symb 80 x 2 x 12h  prior to study with DLCO  67/68c % corrects to 100 % for alv volume   - 01/06/2017  After extensive coaching inhaler device  effectiveness =  10% - 01/27/2018 cough resolved maint on just 1st gen H1 blockers per guidelines  And off inhalers      Convincing response to 1st gen H1 blockers is strongly indicative of Upper airway cough syndrome (previously labeled PNDS),  is so named because it's frequently impossible to sort out how much is  CR/sinusitis with freq throat clearing (which can be related to primary GERD)   vs  causing  secondary (" extra esophageal")  GERD from wide swings in gastric pressure that occur with throat clearing, often  promoting self use of mint and menthol lozenges that reduce the lower esophageal sphincter tone and exacerbate the problem further in a cyclical fashion.   These are the same pts (now being labeled as having "irritable larynx syndrome" by some cough centers) who not infrequently have a history of having failed to tolerate ace inhibitors,  dry powder inhalers or biphosphonates or report having atypical/extraesophageal reflux symptoms that don't respond to standard doses of PPI  and are easily confused as having aecopd or asthma flares by even experienced allergists/ pulmonologists (myself included).    No need for pulmonary f/u for this problem as adeq control on otcs   I had an extended summary final discussion with the patient reviewing all relevant studies completed to date and  lasting 15 to 20 minutes of a 25 minute visit  Which was  extended due to language barrier and demonstrated repeated inability to process instructions.  Each maintenance medication was reviewed in detail including most importantly the difference between maintenance and prns and under what circumstances the prns are to be triggered using an action plan format that is not reflected in the computer generated alphabetically organized AVS.     Please see AVS for specific instructions unique to this visit that I personally wrote and verbalized to the the pt in detail and then reviewed with pt  by my nurse highlighting any  changes in therapy recommended at today's visit to their plan of care.

## 2018-01-30 ENCOUNTER — Other Ambulatory Visit (HOSPITAL_COMMUNITY): Payer: Self-pay | Admitting: Orthopedic Surgery

## 2018-01-30 ENCOUNTER — Ambulatory Visit: Payer: Self-pay | Admitting: Internal Medicine

## 2018-01-30 ENCOUNTER — Other Ambulatory Visit: Payer: Self-pay | Admitting: Orthopedic Surgery

## 2018-01-30 DIAGNOSIS — Z96612 Presence of left artificial shoulder joint: Secondary | ICD-10-CM

## 2018-01-31 DIAGNOSIS — K219 Gastro-esophageal reflux disease without esophagitis: Secondary | ICD-10-CM | POA: Diagnosis not present

## 2018-01-31 DIAGNOSIS — M25511 Pain in right shoulder: Secondary | ICD-10-CM | POA: Diagnosis not present

## 2018-01-31 DIAGNOSIS — M545 Low back pain: Secondary | ICD-10-CM | POA: Diagnosis not present

## 2018-01-31 DIAGNOSIS — E7800 Pure hypercholesterolemia, unspecified: Secondary | ICD-10-CM | POA: Diagnosis not present

## 2018-01-31 DIAGNOSIS — E1169 Type 2 diabetes mellitus with other specified complication: Secondary | ICD-10-CM | POA: Diagnosis not present

## 2018-01-31 DIAGNOSIS — F419 Anxiety disorder, unspecified: Secondary | ICD-10-CM | POA: Diagnosis not present

## 2018-01-31 DIAGNOSIS — I4891 Unspecified atrial fibrillation: Secondary | ICD-10-CM | POA: Diagnosis not present

## 2018-02-06 DIAGNOSIS — K760 Fatty (change of) liver, not elsewhere classified: Secondary | ICD-10-CM | POA: Diagnosis not present

## 2018-02-06 DIAGNOSIS — R1031 Right lower quadrant pain: Secondary | ICD-10-CM | POA: Diagnosis not present

## 2018-02-06 DIAGNOSIS — I10 Essential (primary) hypertension: Secondary | ICD-10-CM | POA: Diagnosis not present

## 2018-02-06 DIAGNOSIS — R590 Localized enlarged lymph nodes: Secondary | ICD-10-CM | POA: Diagnosis not present

## 2018-02-06 DIAGNOSIS — Z79899 Other long term (current) drug therapy: Secondary | ICD-10-CM | POA: Diagnosis not present

## 2018-02-06 DIAGNOSIS — R591 Generalized enlarged lymph nodes: Secondary | ICD-10-CM | POA: Diagnosis not present

## 2018-02-06 DIAGNOSIS — Z886 Allergy status to analgesic agent status: Secondary | ICD-10-CM | POA: Diagnosis not present

## 2018-02-06 DIAGNOSIS — I4891 Unspecified atrial fibrillation: Secondary | ICD-10-CM | POA: Diagnosis not present

## 2018-02-06 DIAGNOSIS — R109 Unspecified abdominal pain: Secondary | ICD-10-CM | POA: Diagnosis not present

## 2018-02-06 DIAGNOSIS — M4856XA Collapsed vertebra, not elsewhere classified, lumbar region, initial encounter for fracture: Secondary | ICD-10-CM | POA: Diagnosis not present

## 2018-02-06 DIAGNOSIS — Z7982 Long term (current) use of aspirin: Secondary | ICD-10-CM | POA: Diagnosis not present

## 2018-02-07 ENCOUNTER — Encounter (HOSPITAL_COMMUNITY): Payer: Medicare HMO

## 2018-02-07 ENCOUNTER — Encounter (HOSPITAL_COMMUNITY): Admission: RE | Admit: 2018-02-07 | Payer: Medicare HMO | Source: Ambulatory Visit

## 2018-02-08 ENCOUNTER — Encounter: Payer: Self-pay | Admitting: Neurology

## 2018-02-08 ENCOUNTER — Ambulatory Visit: Payer: Medicare HMO | Admitting: Neurology

## 2018-02-08 ENCOUNTER — Other Ambulatory Visit: Payer: Self-pay

## 2018-02-08 VITALS — BP 140/48 | HR 64 | Ht 63.0 in | Wt 133.0 lb

## 2018-02-08 DIAGNOSIS — F0390 Unspecified dementia without behavioral disturbance: Secondary | ICD-10-CM

## 2018-02-08 DIAGNOSIS — F03A0 Unspecified dementia, mild, without behavioral disturbance, psychotic disturbance, mood disturbance, and anxiety: Secondary | ICD-10-CM

## 2018-02-08 MED ORDER — RIVASTIGMINE TARTRATE 1.5 MG PO CAPS: 1.5000 mg | ORAL_CAPSULE | Freq: Two times a day (BID) | ORAL | 11 refills | Status: DC

## 2018-02-08 MED ORDER — SODIUM CHLORIDE 0.9 % IV SOLN
10.00 | INTRAVENOUS | Status: DC
Start: ? — End: 2018-02-08

## 2018-02-08 MED ORDER — GENERIC EXTERNAL MEDICATION
10.00 | Status: DC
Start: ? — End: 2018-02-08
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
## 2018-02-08 NOTE — Patient Instructions (Signed)
1. Start Rivastigmine 1.5mg  twice a day 2. Recommend family do the driving 3. Follow-up in 6 months, call for any changes  FALL PRECAUTIONS: Be cautious when walking. Scan the area for obstacles that may increase the risk of trips and falls. When getting up in the mornings, sit up at the edge of the bed for a few minutes before getting out of bed. Consider elevating the bed at the head end to avoid drop of blood pressure when getting up. Walk always in a well-lit room (use night lights in the walls). Avoid area rugs or power cords from appliances in the middle of the walkways. Use a walker or a cane if necessary and consider physical therapy for balance exercise. Get your eyesight checked regularly.  FINANCIAL OVERSIGHT: Supervision, especially oversight when making financial decisions or transactions is also recommended.  HOME SAFETY: Consider the safety of the kitchen when operating appliances like stoves, microwave oven, and blender. Consider having supervision and share cooking responsibilities until no longer able to participate in those. Accidents with firearms and other hazards in the house should be identified and addressed as well.  DRIVING: Regarding driving, in patients with progressive memory problems, driving will be impaired. We advise to have someone else do the driving if trouble finding directions or if minor accidents are reported. Independent driving assessment is available to determine safety of driving.  ABILITY TO BE LEFT ALONE: If patient is unable to contact 911 operator, consider using LifeLine, or when the need is there, arrange for someone to stay with patients. Smoking is a fire hazard, consider supervision or cessation. Risk of wandering should be assessed by caregiver and if detected at any point, supervision and safe proof recommendations should be instituted.  MEDICATION SUPERVISION: Inability to self-administer medication needs to be constantly addressed. Implement a  mechanism to ensure safe administration of the medications.  RECOMMENDATIONS FOR ALL PATIENTS WITH MEMORY PROBLEMS: 1. Continue to exercise (Recommend 30 minutes of walking everyday, or 3 hours every week) 2. Increase social interactions - continue going to [place] and enjoy social gatherings with friends and family 3. Eat healthy, avoid fried foods and eat more fruits and vegetables 4. Maintain adequate blood pressure, blood sugar, and blood cholesterol level. Reducing the risk of stroke and cardiovascular disease also helps promoting better memory. 5. Avoid stressful situations. Live a simple life and avoid aggravations. Organize your time and prepare for the next day in anticipation. 6. Sleep well, avoid any interruptions of sleep and avoid any distractions in the bedroom that may interfere with adequate sleep quality 7. Avoid sugar, avoid sweets as there is a strong link between excessive sugar intake, diabetes, and cognitive impairment The Mediterranean diet has been shown to help patients reduce the risk of progressive memory disorders and reduces cardiovascular risk. This includes eating fish, eat fruits and green leafy vegetables, nuts like almonds and hazelnuts, walnuts, and also use olive oil. Avoid fast foods and fried foods as much as possible. Avoid sweets and sugar as sugar use has been linked to worsening of memory function.  There is always a concern of gradual progression of memory problems. If this is the case, then we may need to adjust level of care according to patient needs. Support, both to the patient and caregiver, should then be put into place.

## 2018-02-09 ENCOUNTER — Encounter: Payer: Self-pay | Admitting: Neurology

## 2018-02-10 DIAGNOSIS — R590 Localized enlarged lymph nodes: Secondary | ICD-10-CM | POA: Diagnosis not present

## 2018-02-10 DIAGNOSIS — S32010D Wedge compression fracture of first lumbar vertebra, subsequent encounter for fracture with routine healing: Secondary | ICD-10-CM | POA: Diagnosis not present

## 2018-02-10 DIAGNOSIS — F439 Reaction to severe stress, unspecified: Secondary | ICD-10-CM | POA: Diagnosis not present

## 2018-02-10 DIAGNOSIS — M25511 Pain in right shoulder: Secondary | ICD-10-CM | POA: Diagnosis not present

## 2018-02-10 DIAGNOSIS — F0390 Unspecified dementia without behavioral disturbance: Secondary | ICD-10-CM | POA: Diagnosis not present

## 2018-02-13 DIAGNOSIS — M545 Low back pain: Secondary | ICD-10-CM | POA: Diagnosis not present

## 2018-02-13 DIAGNOSIS — M6281 Muscle weakness (generalized): Secondary | ICD-10-CM | POA: Diagnosis not present

## 2018-02-13 DIAGNOSIS — R262 Difficulty in walking, not elsewhere classified: Secondary | ICD-10-CM | POA: Diagnosis not present

## 2018-02-14 ENCOUNTER — Encounter (HOSPITAL_COMMUNITY)
Admission: RE | Admit: 2018-02-14 | Discharge: 2018-02-14 | Disposition: A | Discharge status: Home / Self Care | Payer: Medicare HMO | Source: Ambulatory Visit | Attending: Orthopedic Surgery | Admitting: Orthopedic Surgery

## 2018-02-14 DIAGNOSIS — Z96612 Presence of left artificial shoulder joint: Secondary | ICD-10-CM

## 2018-02-14 DIAGNOSIS — R948 Abnormal results of function studies of other organs and systems: Secondary | ICD-10-CM | POA: Diagnosis not present

## 2018-02-14 MED ORDER — TECHNETIUM TC 99M MEDRONATE IV KIT
20.0000 | PACK | Freq: Once | INTRAVENOUS | Status: AC | PRN
  Administered 2018-02-14: 20 via INTRAVENOUS

## 2018-02-17 DIAGNOSIS — M25511 Pain in right shoulder: Secondary | ICD-10-CM | POA: Diagnosis not present

## 2018-02-21 DIAGNOSIS — M545 Low back pain: Secondary | ICD-10-CM | POA: Diagnosis not present

## 2018-02-22 DIAGNOSIS — R1110 Vomiting, unspecified: Secondary | ICD-10-CM | POA: Diagnosis not present

## 2018-02-22 DIAGNOSIS — K589 Irritable bowel syndrome without diarrhea: Secondary | ICD-10-CM | POA: Diagnosis not present

## 2018-02-22 DIAGNOSIS — K219 Gastro-esophageal reflux disease without esophagitis: Secondary | ICD-10-CM | POA: Diagnosis not present

## 2018-02-22 DIAGNOSIS — R1031 Right lower quadrant pain: Secondary | ICD-10-CM | POA: Diagnosis not present

## 2018-02-24 DIAGNOSIS — M545 Low back pain: Secondary | ICD-10-CM | POA: Diagnosis not present

## 2018-02-28 DIAGNOSIS — C774 Secondary and unspecified malignant neoplasm of inguinal and lower limb lymph nodes: Secondary | ICD-10-CM | POA: Diagnosis not present

## 2018-02-28 DIAGNOSIS — R599 Enlarged lymph nodes, unspecified: Secondary | ICD-10-CM | POA: Diagnosis not present

## 2018-02-28 DIAGNOSIS — R590 Localized enlarged lymph nodes: Secondary | ICD-10-CM | POA: Diagnosis not present

## 2018-03-01 DIAGNOSIS — M6281 Muscle weakness (generalized): Secondary | ICD-10-CM | POA: Diagnosis not present

## 2018-03-01 DIAGNOSIS — R262 Difficulty in walking, not elsewhere classified: Secondary | ICD-10-CM | POA: Diagnosis not present

## 2018-03-01 DIAGNOSIS — M545 Low back pain: Secondary | ICD-10-CM | POA: Diagnosis not present

## 2018-03-07 DIAGNOSIS — M545 Low back pain: Secondary | ICD-10-CM | POA: Diagnosis not present

## 2018-03-09 ENCOUNTER — Telehealth: Payer: Self-pay | Admitting: Oncology

## 2018-03-09 DIAGNOSIS — M545 Low back pain: Secondary | ICD-10-CM | POA: Diagnosis not present

## 2018-03-09 NOTE — Telephone Encounter (Signed)
Left a message for patient to call to schedule an appointment with Dr. Denman George.

## 2018-03-13 ENCOUNTER — Telehealth: Payer: Self-pay | Admitting: Oncology

## 2018-03-13 NOTE — Telephone Encounter (Signed)
Called [place] and scheduled appointment to see Dr. Denman George on 03/15/18 at 11:30 am.  She verbalized understanding and agreement.

## 2018-03-15 ENCOUNTER — Encounter: Payer: Self-pay | Admitting: Gynecologic Oncology

## 2018-03-15 ENCOUNTER — Encounter: Payer: Self-pay | Admitting: Oncology

## 2018-03-15 ENCOUNTER — Other Ambulatory Visit (HOSPITAL_COMMUNITY)
Admission: RE | Admit: 2018-03-15 | Discharge: 2018-03-15 | Disposition: A | Discharge status: Home / Self Care | Payer: Medicare HMO | Source: Ambulatory Visit | Attending: Gynecologic Oncology | Admitting: Gynecologic Oncology

## 2018-03-15 ENCOUNTER — Telehealth: Payer: Self-pay

## 2018-03-15 ENCOUNTER — Inpatient Hospital Stay: Payer: Medicare HMO | Attending: Gynecologic Oncology | Admitting: Gynecologic Oncology

## 2018-03-15 VITALS — BP 153/44 | HR 58 | Temp 98.0°F | Resp 18 | Ht 63.0 in | Wt 129.0 lb

## 2018-03-15 DIAGNOSIS — E119 Type 2 diabetes mellitus without complications: Secondary | ICD-10-CM | POA: Diagnosis not present

## 2018-03-15 DIAGNOSIS — M79604 Pain in right leg: Secondary | ICD-10-CM | POA: Diagnosis not present

## 2018-03-15 DIAGNOSIS — K219 Gastro-esophageal reflux disease without esophagitis: Secondary | ICD-10-CM | POA: Insufficient documentation

## 2018-03-15 DIAGNOSIS — C779 Secondary and unspecified malignant neoplasm of lymph node, unspecified: Secondary | ICD-10-CM

## 2018-03-15 DIAGNOSIS — M8580 Other specified disorders of bone density and structure, unspecified site: Secondary | ICD-10-CM | POA: Insufficient documentation

## 2018-03-15 DIAGNOSIS — Z888 Allergy status to other drugs, medicaments and biological substances status: Secondary | ICD-10-CM | POA: Diagnosis not present

## 2018-03-15 DIAGNOSIS — E785 Hyperlipidemia, unspecified: Secondary | ICD-10-CM | POA: Diagnosis not present

## 2018-03-15 DIAGNOSIS — Z7982 Long term (current) use of aspirin: Secondary | ICD-10-CM | POA: Diagnosis not present

## 2018-03-15 DIAGNOSIS — Z79899 Other long term (current) drug therapy: Secondary | ICD-10-CM | POA: Diagnosis not present

## 2018-03-15 DIAGNOSIS — J45909 Unspecified asthma, uncomplicated: Secondary | ICD-10-CM | POA: Insufficient documentation

## 2018-03-15 DIAGNOSIS — C774 Secondary and unspecified malignant neoplasm of inguinal and lower limb lymph nodes: Secondary | ICD-10-CM | POA: Diagnosis not present

## 2018-03-15 DIAGNOSIS — I4891 Unspecified atrial fibrillation: Secondary | ICD-10-CM | POA: Insufficient documentation

## 2018-03-15 DIAGNOSIS — Z7901 Long term (current) use of anticoagulants: Secondary | ICD-10-CM | POA: Insufficient documentation

## 2018-03-15 DIAGNOSIS — M069 Rheumatoid arthritis, unspecified: Secondary | ICD-10-CM | POA: Insufficient documentation

## 2018-03-15 DIAGNOSIS — C801 Malignant (primary) neoplasm, unspecified: Secondary | ICD-10-CM | POA: Insufficient documentation

## 2018-03-15 DIAGNOSIS — I10 Essential (primary) hypertension: Secondary | ICD-10-CM | POA: Diagnosis not present

## 2018-03-15 DIAGNOSIS — N72 Inflammatory disease of cervix uteri: Secondary | ICD-10-CM | POA: Diagnosis not present

## 2018-03-15 DIAGNOSIS — K580 Irritable bowel syndrome with diarrhea: Secondary | ICD-10-CM | POA: Diagnosis not present

## 2018-03-15 DIAGNOSIS — R05 Cough: Secondary | ICD-10-CM | POA: Diagnosis not present

## 2018-03-15 DIAGNOSIS — J101 Influenza due to other identified influenza virus with other respiratory manifestations: Secondary | ICD-10-CM | POA: Diagnosis not present

## 2018-03-15 DIAGNOSIS — R509 Fever, unspecified: Secondary | ICD-10-CM | POA: Diagnosis not present

## 2018-03-15 NOTE — Progress Notes (Signed)
Consult Note: Gyn-Onc  Consult was requested by Dr. Moreen Fowler for the evaluation of Lorraine Gilbert 85 y.o. female  CC:  Chief Complaint  Patient presents with  . squamous cell carcinoma of unknown primary    Assessment/Plan:  Lorraine. Kassiah Mccrory  is a 85 y.o.  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  I discussed with the patient and her daughter-in-law today that her examination was normal of her lower reproductive tract and perianal tissues.  We will follow-up the Pap and biopsies that were taken from representative areas.  We will obtain a PET/CT to better evaluate for source of primary malignancy.  Should the studies all failed to demonstrate a clear primary, her diagnosis will be metastatic squamous cell carcinoma of unknown primary.  We will review her case at the multidisciplinary tumor board conference conferring with radiation oncology and medical oncology regarding next best therapy.  I will discuss if there is a role for surgical debulking of the lymph node in this case, though it is unclear at this time.  HPI: Lorraine Gilbert is an 85 year old P6 who is seen in consultation at the request of Dr Moreen Fowler for metastatic squamous cell carcinoma in the left inguinal lymph nodes.   The patient has a longstanding history of irritable bowel syndrome.  She developed some abdominal pain that was [place] generalized and nonlocalized in January 2020.  This was worked up with a CT scan of the abdomen and pelvis that was performed via [facility] radiology.  This revealed an enlarged lymph node in the left inguinal region measuring 2.6 x 1.5 cm which was newly enlarged from her prior exam in 2014.  There were no other enlarged lymph nodes identified.  The any other findings on the CT scan were a 1.3 cm low-attenuation lesion in the far lateral left hepatic lobe that had been less apparent on the previous examination, and a 1.6 cm right renal cyst.   The reproductive organs were unremarkable.  There was a mild L1 compression deformity.  The patient denied symptoms of vulva pruritus, vaginal bleeding, pelvic pain, blood in her stools, change in bladder or bowel habit.  Patient's medical history is significant for some mild dementia though she is independently functioning and signed her own medical consents.  She has a history of atrial fibrillation and is on anticoagulation for this with Eliquis and aspirin 81 mg.  She has a history of type II but diabetes mellitus which she states is controlled with diet only however and hemoglobin A1c from January 2020 was elevated at 7%.  She has a history of rheumatoid arthritis but takes no steroid medication on methotrexate for this.  She has a history of glaucoma.  She has a history of irritable bowel syndrome and intermittent constipation diarrhea and some anal leakage.  She has no family history remarkable for malignancies.  Her last colonoscopy was within a couple of years and she states that it was normal.  She is never had an abnormal Pap smear.  She reports that she had regular gynecologic examinations during her reproductive years but none recently.  She is never had a gynecologic procedure other than a tubal ligation.  She has never had episodes of postmenopausal bleeding.  Specifically she has never had any cervical dysplasia diagnosed on Pap smear treated.  Current Meds:  Outpatient Encounter Medications as of 03/15/2018  Medication Sig  . apixaban (ELIQUIS) 5 MG TABS tablet Take  5 mg by mouth 2 (two) times daily.  Marland Kitchen aspirin 81 MG tablet Take 81 mg by mouth every morning.   . Cholecalciferol (VITAMIN D3) 50000 units CAPS Take 50,000 Units by mouth every Wednesday.  . digoxin (LANOXIN) 0.125 MG tablet Take 125 mcg by mouth every morning.   . diltiazem (CARDIZEM CD) 240 MG 24 hr capsule Take 240 mg by mouth daily.  . dorzolamide (TRUSOPT) 2 % ophthalmic solution Place 1 drop into both eyes 2  (two) times daily.  Marland Kitchen escitalopram (LEXAPRO) 20 MG tablet Take 20 mg by mouth daily.   . folic acid (FOLVITE) 1 MG tablet Take 1 mg by mouth every morning.   . furosemide (LASIX) 20 MG tablet Take 20 mg by mouth daily as needed for fluid.  Marland Kitchen latanoprost (XALATAN) 0.005 % ophthalmic solution Place 1 drop into both eyes at bedtime.  . pantoprazole (PROTONIX) 40 MG tablet Take 30- 60 min before your first and last meals of the day  . pentoxifylline (TRENTAL) 400 MG CR tablet Take 400 mg by mouth 3 (three) times daily with meals.  . rivastigmine (EXELON) 1.5 MG capsule Take 1 capsule (1.5 mg total) by mouth 2 (two) times daily.  . timolol (BETIMOL) 0.5 % ophthalmic solution Place 1 drop into both eyes daily.   . vitamin C (ASCORBIC ACID) 500 MG tablet Take 500 mg by mouth daily.   No facility-administered encounter medications on file as of 03/15/2018.     Allergy:  Allergies  Allergen Reactions  . Tylenol With Codeine #3 [Acetaminophen-Codeine] Other (See Comments)    Makes patient feel like she is flying  . Ibuprofen Other (See Comments)    Confusion, Patient states that the 800mg  Motrin made her feel like she was flying.  . Morphine Rash and Other (See Comments)    Patient says it makes her feel like she is flying.(Patient states on 03/15/2018, that she has never taken Morphine).    Social Hx:   Social History   Socioeconomic History  . Marital status: Married    Spouse name: Not on file  . Number of children: 6  . Years of education: Not on file  . Highest education level: Not on file  Occupational History  . Occupation: Retired  Scientific laboratory technician  . Financial resource strain: Not on file  . Food insecurity:    Worry: Not on file    Inability: Not on file  . Transportation needs:    Medical: Not on file    Non-medical: Not on file  Tobacco Use  . Smoking status: Never Smoker  . Smokeless tobacco: Never Used  Substance and Sexual Activity  . Alcohol use: No    Alcohol/week:  0.0 standard drinks  . Drug use: No  . Sexual activity: Not on file  Lifestyle  . Physical activity:    Days per week: Not on file    Minutes per session: Not on file  . Stress: Not on file  Relationships  . Social connections:    Talks on phone: Not on file    Gets together: Not on file    Attends religious service: Not on file    Active member of club or organization: Not on file    Attends meetings of clubs or organizations: Not on file    Relationship status: Not on file  . Intimate partner violence:    Fear of current or ex partner: Not on file    Emotionally abused: Not on file  Physically abused: Not on file    Forced sexual activity: Not on file  Other Topics Concern  . Not on file  Social History Narrative  . Not on file    Past Surgical Hx:  Past Surgical History:  Procedure Laterality Date  . CATARACT EXTRACTION     bilateral  . EYE SURGERY    . REVERSE SHOULDER ARTHROPLASTY Right 08/04/2017  . REVERSE SHOULDER ARTHROPLASTY Right 08/04/2017   Procedure: RIGHT REVERSE SHOULDER ARTHROPLASTY;  Surgeon: Justice Britain, MD;  Location: [facility];  Service: Orthopedics;  Laterality: Right;  . TUBAL LIGATION      Past Medical Hx:  Past Medical History:  Diagnosis Date  . A-fib ([place])   . Abnormal heart rhythm   . Allergic rhinitis   . Anxiety   . Asthma   . Diabetes mellitus without complication ([place])    Type II  . GERD (gastroesophageal reflux disease)   . Headache   . Hypertension   . Neuromuscular disorder ([place])    Siactic pain in right leg  . Osteopenia   . Rheumatoid arthritis ([place])   . Rotator cuff tear arthropathy    Right  . Vertigo     Past Gynecological History:  SVD x 5, no history of HPV or dysplasia No LMP recorded. Patient is postmenopausal.  Family Hx:  Family History  Problem Relation Age of Onset  . Emphysema Father        smoked  . Heart disease Mother   . Heart attack Son   . Breast cancer Neg Hx     Review of  Systems:  Constitutional  Feels well,    ENT Normal appearing ears and nares bilaterally Skin/Breast  No rash, sores, jaundice, itching, dryness Cardiovascular  No chest pain, shortness of breath, or edema  Pulmonary  + cough  Gastro Intestinal  No nausea, vomitting, or diarrhoea. No bright red blood per rectum, no abdominal pain, change in bowel movement, or constipation.  Genito Urinary  No frequency, urgency, dysuria, no bleeding or pruritis or discharge.  Musculo Skeletal  No myalgia, arthralgia, joint swelling or pain  Neurologic  No weakness, numbness, change in gait,  Psychology  No depression, anxiety, insomnia.   Vitals:  Blood pressure (!) 153/44, pulse (!) 58, temperature 98 F (36.7 C), resp. rate 18, height 5\' 3"  (1.6 m), weight 129 lb (58.5 kg).  Physical Exam: WD in NAD Neck  Supple NROM, without any enlargements.  Lymph Node Survey + palpable medial left inguinal lymph node 2+cm, minimally mobile.  Cardiovascular  Pulse normal rate, regularity and rhythm. S1 and S2 normal.  Lungs  Clear to auscultation bilateraly, without wheezes/crackles/rhonchi. Good air movement.  Skin  No rash/lesions/breakdown  Psychiatry  Alert and oriented to person, place, and time  Abdomen  Normoactive bowel sounds, abdomen soft, non-tender and thin without evidence of hernia. Back No CVA tenderness Genito Urinary  Vulva/vagina: Normal external female genitalia.  No lesions. No discharge or bleeding. Acetic acid applied - no acetowhite changes.   Bladder/urethra:  No lesions or masses, well supported bladder  Vagina: no visible or palpable lesions  Cervix: Normal appearing, no lesions. Atrophic and flush with upper vagina.   Uterus:  Small, mobile, no parametrial involvement or nodularity.  Adnexa: no palpable masses. Rectal  Good tone, no masses no cul de sac nodularity. No palpable anal lesions.  Extremities  No bilateral cyanosis, clubbing or edema.  Procedure Note:   Preop Dx: metastatic SCC of unknown primary Postop  Dx: same Procedure: biopsies of cervix Surgeon: Dorann Ou, MD EBL: minimal Specimens: 1/ ectocervix at 12 o'clock, 2/ endocervical curettage (ECC) Complications: none Procedure Details: The patient provided verbal consent and a verbal timeout was performed.  The speculum was inserted into the vagina and the cervix was visualized.  Was closely inspected no gross lesions were identified and therefore a representative biopsy was taken from 12:00 of the ectocervix.  The endocervical curette was then placed within the somewhat stenotic atrophic cervical loss.  A brush was used to collect endocervical curetting samples.  Hemostasis was achieved with silver nitrate stick.  Both samples were sent for permanent histopathology.  Patient tolerated procedure well.    Thereasa Solo, MD  03/15/2018, 12:13 PM

## 2018-03-15 NOTE — Patient Instructions (Signed)
Dr Denman George has performed biopsies of the cervix today. The skin of the vulva, anus and vagina looked normal. The skin of the cervix looked normal.   Dr Denman George will order a PET scan to evaluate for the source of the cancer.  Once the results of the biopsies and PET scan are back, Dr Denman George will determine if you should be seen next by radiation doctors, chemotherapy doctors or both.  The diagnosis at present is "squamous cell carcinoma to the lymph nodes of unknown primary".

## 2018-03-15 NOTE — Patient Instructions (Signed)
Component Name 02/06/2018 12/20/2017 12/20/2017 12/20/2017 06/19/2017 06/16/2017 05/31/2017 04/06/2016 09/01/2015 05/04/2013 05/03/2013 12/18/2012 07/31/2012 02/29/2012 10/24/2011 11/04/2009 10/27/2009 10/07/2009 07/02/2009 12/29/2008 04/08/2008          146 (H)  '141 140 140 137 137 137 143 141 142 139 '$ 139 141  4.0 4.9  4.3 4.4 4.4 4.7 5.1 5.3 3.7 4.6 4.7 4.3 4 4.2 4.5 4.7 4.9 3.6 4.6 4.'9         102  102 101 103 105 104 103 110 108 107 108 108 104           26 24 27 22 20 23 27 27 29 22 25 ''29           13 15 10 10 13 11 6 6 6 9 6 8  '$ 139 (H) 178 (H)  227 (H) 132 (H) 133 (H) 162 (H) 78 109 (H) 235 (H) 216 (H) 204 (H) 118 (H) 124 (H) 186 (H) 186 (H) 100 (H) 151 (H) 223 (H) 103 (H) 99  '14 14  14 13 13 18 16 14 15 12 14 13 17 19 13 13 14 18 13 20  '$ 0.72 0.79  0.83 0.52 (L) 0.59 0.80  0.69 0.84 0.67 0.7 1.01 (H) 0.84 0.84 0.8 0.7 0.8 0.8 0.8 0.7           10.2 10.3 (H) 9.6 9.4 8.8 7.8 (L) 9.2 9.2 9.7 9.5 9.5 9.'9                 25 24 24 '$ 32 26 28         0.3  0.32 0.51 0.28 0.4 0.5 0.4 0.6 0.6 0.4 0.5 0.5 0.8              <0.2        0.1              >0.20        0.7         4.1  4.3 4.4 4.1 4.5 4.1 4.1 3.8 3.7 3.8 4.2 3.9 3.'9         15  26 30 22 '$ 38 36 67 (H) '21 23 23 '$ 33 28 39         82  '103 103 83 70 52 57 70 65 66 72 '$ 90 50  7.5   7.9 7.3 6.7 6.8 7.1 7.7 8.1 8.3 7.2 7.8 7.3 7.4 6.7 7.0 7.2 7.6 7.1 7.4              88        131  47        45  57  53 37       45           2 2.1 2.2       2.2  2.3           >60 >60 >60 >60 >60 >60 >60 >60 >60 >60 >60 >60           >60 >60 >60 53 >60 >60 >60 >60 >60 >60 >60 >60           3.8 3.9 3.1 3.3 3.2 3.3               1.4  1.1 1.1 1.3 1.4 1.3 1.2               21  17.9 17.9 20.0 12.9 20.2 22.6        15  $'24 17 15 17 16 20 24 'L'36  21 34 25 27        138 141  139 140 145 142  140              100 106  103 104 106 104  102              26 24  24 27 28 28 '$ 25 27              9.6 9.2  9.6 9.6 9.6 9.1  9.4              129   99 71 69 65  82              0.18   0.16 0.24 0.25 0.40  0.33              3.8   4.0 4.3 3.7 3.7  4.2              3.7   3.9 3.0 3.0 3.1  3.5              1.0 (L)   1.0 (L) 1.4 1.2 1.2  1.2              19.4 17.7  16.9 25.0 22.0 22.5  20.'3              12   16 22 24 27  18              '$ 90 80  75 102 98 79  94              78 69  65 89 85 68  82              '12 11  12 9 11 10  11                 '$ 2.0     2.2                     0.78                      72                      82                      9.6                      3.0                   1.3                    0.9                    Sodium  Potassium  Chloride  Carbon Dioxide (CO2)  Anion Gap  Glucose  BUN  Creatinine  Calcium  SGOT (AST)  Total Bilirubin  Bilirubin Direct  Bilirubin Indirect  Albumin, Serum  ALT (SGPT)  Alkaline Phosphatase  Total Protein  Amylase  Lipase  Magnesium  GFR-African American  GFR Non-African American  Globulin  Albumin/Globulin Ratio  BUN/Creatinine Ratio  AST  Na  Cl  CO2  Ca  ALK PHOS  T Bili  Alb  GLOBULIN  ALBUMIN/GLOBULIN RATIO  BUN/CREAT RATIO  ALT  GFR AFRICAN AMERICAN  GFR Non African American  AGAP  Mg  Creatinine, Serum  eGFR If NonAfrican American  eGFR If African American  CALCIUM  Globulin, Total  Lactic Acid  POC CREAT

## 2018-03-15 NOTE — Telephone Encounter (Signed)
Component Name 02/06/2018 12/20/2017 12/20/2017 12/20/2017 06/19/2017 06/16/2017 05/31/2017 04/06/2016 09/01/2015 05/04/2013 05/03/2013 12/18/2012 07/31/2012 02/29/2012 10/24/2011 11/04/2009 10/27/2009 10/07/2009 07/02/2009 12/29/2008 04/08/2008          146 (H)  '141 140 140 137 137 137 143 141 142 139 '$ 139 141  4.0 4.9  4.3 4.4 4.4 4.7 5.1 5.3 3.7 4.6 4.7 4.3 4 4.2 4.5 4.7 4.9 3.6 4.6 4.'9         102  102 101 103 105 104 103 110 108 107 108 108 104           26 24 27 22 20 23 27 27 29 22 25 ''29           13 15 10 10 13 11 6 6 6 9 6 8  '$ 139 (H) 178 (H)  227 (H) 132 (H) 133 (H) 162 (H) 78 109 (H) 235 (H) 216 (H) 204 (H) 118 (H) 124 (H) 186 (H) 186 (H) 100 (H) 151 (H) 223 (H) 103 (H) 99  '14 14  14 13 13 18 16 14 15 12 14 13 17 19 13 13 14 18 13 20  '$ 0.72 0.79  0.83 0.52 (L) 0.59 0.80  0.69 0.84 0.67 0.7 1.01 (H) 0.84 0.84 0.8 0.7 0.8 0.8 0.8 0.7           10.2 10.3 (H) 9.6 9.4 8.8 7.8 (L) 9.2 9.2 9.7 9.5 9.5 9.'9                 25 24 24 '$ 32 26 28         0.3  0.32 0.51 0.28 0.4 0.5 0.4 0.6 0.6 0.4 0.5 0.5 0.8              <0.2        0.1              >0.20        0.7         4.1  4.3 4.4 4.1 4.5 4.1 4.1 3.8 3.7 3.8 4.2 3.9 3.'9         15  26 30 22 '$ 38 36 67 (H) '21 23 23 '$ 33 28 39         82  '103 103 83 70 52 57 70 65 66 72 '$ 90 50  7.5   7.9 7.3 6.7 6.8 7.1 7.7 8.1 8.3 7.2 7.8 7.3 7.4 6.7 7.0 7.2 7.6 7.1 7.4              88        131  47        45  57  53 37       45           2 2.1 2.2       2.2  2.3           >60 >60 >60 >60 >60 >60 >60 >60 >60 >60 >60 >60           >60 >60 >60 53 >60 >60 >60 >60 >60 >60 >60 >60           3.8 3.9 3.1 3.3 3.2 3.3               1.4  1.1 1.1 1.3 1.4 1.3 1.2               21  17.9 17.9 20.0 12.9 20.2 22.6        15  $'24 17 15 17 16 20 24 'r'36  21 34 25 27        138 141  139 140 145 142  140              100 106  103 104 106 104  102              26 24  24 27 28 28 '$ 25 27              9.6 9.2  9.6 9.6 9.6 9.1  9.4              129   99 71 69 65  82              0.18   0.16 0.24 0.25 0.40  0.33              3.8   4.0 4.3 3.7 3.7  4.2              3.7   3.9 3.0 3.0 3.1  3.5              1.0 (L)   1.0 (L) 1.4 1.2 1.2  1.2              19.4 17.7  16.9 25.0 22.0 22.5  20.'3              12   16 22 24 27  18              '$ 90 80  75 102 98 79  94              78 69  65 89 85 68  82              '12 11  12 9 11 10  11                 '$ 2.0     2.2                     0.78                      72                      82                      9.6                      3.0                   1.3                    0.9                    Sodium  Potassium  Chloride  Carbon Dioxide (CO2)  Anion Gap  Glucose  BUN  Creatinine  Calcium  SGOT (AST)  Total Bilirubin  Bilirubin Direct  Bilirubin Indirect  Albumin, Serum  ALT (SGPT)  Alkaline Phosphatase  Total Protein  Amylase  Lipase  Magnesium  GFR-African American  GFR Non-African American  Globulin  Albumin/Globulin Ratio  BUN/Creatinine Ratio  AST  Na  Cl  CO2  Ca  ALK PHOS  T Bili  Alb  GLOBULIN  ALBUMIN/GLOBULIN RATIO  BUN/CREAT RATIO  ALT  GFR AFRICAN AMERICAN  GFR Non African American  AGAP  Mg  Creatinine, Serum  eGFR If NonAfrican American  eGFR If African American  CALCIUM  Globulin, Total  Lactic Acid  POC CREAT   PROTEIN ELEC / IFELatest Result: 02/06/2018  PROTEIN ELEC / IFE Component  Name 02/06/2018 12/20/2017 06/19/2017 06/16/2017 05/31/2017 04/06/2016 09/01/2015 05/04/2013 05/03/2013 12/18/2012 07/31/2012 02/29/2012 10/24/2011          3.8 3.9 3.1 3.3 3.2 3.3  3.7 3.9 3.0 3.0 3.1  3.5             3.0         Globulin  GLOBULIN  Globulin, Total   THYROID

## 2018-03-16 ENCOUNTER — Telehealth: Payer: Self-pay

## 2018-03-16 NOTE — Telephone Encounter (Signed)
Outgoing call to patient per Joylene John NP regarding surgical path report 2-26 as "still waiting on pap results, No cancer on biopsies taken in office, proceed with PET scan as scheduled"- pt voiced understanding.  Confirmed her PET scan appt for 03-23-2018 at 3 pm.  No other needs per pt at this time.

## 2018-03-17 LAB — CYTOLOGY - PAP
Diagnosis: NEGATIVE
HPV (WINDOPATH): NOT DETECTED

## 2018-03-17 NOTE — Telephone Encounter (Signed)
Outgoing call to patient regarding pap smear results back - per Joylene John NP "normal pap smear, HPV not detected" - pt voiced understanding.  No other needs per pt at this time.

## 2018-03-23 ENCOUNTER — Ambulatory Visit (HOSPITAL_COMMUNITY)
Admission: RE | Admit: 2018-03-23 | Discharge: 2018-03-23 | Disposition: A | Discharge status: Home / Self Care | Payer: Medicare HMO | Source: Ambulatory Visit | Attending: Gynecologic Oncology | Admitting: Gynecologic Oncology

## 2018-03-23 DIAGNOSIS — C779 Secondary and unspecified malignant neoplasm of lymph node, unspecified: Secondary | ICD-10-CM | POA: Insufficient documentation

## 2018-03-23 DIAGNOSIS — C801 Malignant (primary) neoplasm, unspecified: Secondary | ICD-10-CM | POA: Insufficient documentation

## 2018-03-23 DIAGNOSIS — C4492 Squamous cell carcinoma of skin, unspecified: Secondary | ICD-10-CM | POA: Diagnosis not present

## 2018-03-23 LAB — GLUCOSE, CAPILLARY: Glucose-Capillary: 86 mg/dL (ref 70–99)

## 2018-03-23 MED ORDER — FLUDEOXYGLUCOSE F - 18 (FDG) INJECTION
6.3000 | Freq: Once | INTRAVENOUS | Status: AC | PRN
  Administered 2018-03-23: 6.3 via INTRAVENOUS

## 2018-03-24 ENCOUNTER — Encounter: Payer: Self-pay | Admitting: Adult Health

## 2018-03-24 ENCOUNTER — Ambulatory Visit: Payer: Medicare HMO | Admitting: Adult Health

## 2018-03-24 ENCOUNTER — Telehealth: Payer: Self-pay | Admitting: *Deleted

## 2018-03-24 ENCOUNTER — Ambulatory Visit (INDEPENDENT_AMBULATORY_CARE_PROVIDER_SITE_OTHER)
Admission: RE | Admit: 2018-03-24 | Discharge: 2018-03-24 | Disposition: A | Discharge status: Home / Self Care | Payer: Medicare HMO | Source: Ambulatory Visit | Attending: Adult Health | Admitting: Adult Health

## 2018-03-24 ENCOUNTER — Telehealth: Payer: Self-pay | Admitting: Oncology

## 2018-03-24 VITALS — BP 128/54 | HR 68 | Temp 98.2°F | Ht 63.0 in | Wt 131.6 lb

## 2018-03-24 DIAGNOSIS — R05 Cough: Secondary | ICD-10-CM

## 2018-03-24 DIAGNOSIS — C801 Malignant (primary) neoplasm, unspecified: Secondary | ICD-10-CM

## 2018-03-24 DIAGNOSIS — C779 Secondary and unspecified malignant neoplasm of lymph node, unspecified: Secondary | ICD-10-CM

## 2018-03-24 DIAGNOSIS — R911 Solitary pulmonary nodule: Secondary | ICD-10-CM | POA: Insufficient documentation

## 2018-03-24 DIAGNOSIS — R059 Cough, unspecified: Secondary | ICD-10-CM

## 2018-03-24 DIAGNOSIS — J45991 Cough variant asthma: Secondary | ICD-10-CM | POA: Diagnosis not present

## 2018-03-24 MED ORDER — LEVALBUTEROL HCL 0.63 MG/3ML IN NEBU
0.6300 mg | INHALATION_SOLUTION | Freq: Once | RESPIRATORY_TRACT | Status: AC
  Administered 2018-03-24: 0.63 mg via RESPIRATORY_TRACT

## 2018-03-24 MED ORDER — DOXYCYCLINE HYCLATE 100 MG PO TABS: 100.0000 mg | ORAL_TABLET | Freq: Two times a day (BID) | ORAL | 0 refills | Status: DC

## 2018-03-24 MED ORDER — PREDNISONE 10 MG PO TABS: ORAL_TABLET | ORAL | 0 refills | Status: DC

## 2018-03-24 MED ORDER — BUDESONIDE-FORMOTEROL FUMARATE 160-4.5 MCG/ACT IN AERO: 2.0000 | INHALATION_SPRAY | Freq: Two times a day (BID) | RESPIRATORY_TRACT | 0 refills | Status: DC

## 2018-03-24 NOTE — Assessment & Plan Note (Signed)
Flare with Bronchitis (post influenza )  Chest xray today with no acute process   xopenex neb x 1   Plan  Patient Instructions  Doxycycline 100mg  Twice daily  For 7 days -take with food  Prednisone 20mg  daily for 5 days and stop  Mucinex Twice daily  As needed  Cough/congestion .  Begin Symbicort Inhaler Sample 2 puffs Twice daily  Until gone then stop . -rinse after use.  Fluids and rest .  Claritin 10mg  .At bedtime  As needed  Drainage .  Will discuss further testing of lung nodules at that visit.  Follow up with Dr. Melvyn Novas in 4  weeks and As needed   Please contact office for sooner follow up if symptoms do not improve or worsen or seek emergency care

## 2018-03-24 NOTE — Addendum Note (Signed)
Addended by: Parke Poisson E on: 03/24/2018 05:01 PM   Modules accepted: Orders

## 2018-03-24 NOTE — Telephone Encounter (Signed)
Patient called requesting her PET scan results, explained to the patient that we would call her back. Printed the scan and gave to [facility] APP

## 2018-03-24 NOTE — Telephone Encounter (Signed)
Called [facility] and discussed of PET scan per Joylene John, NP. Advised her the next step would be to see Medical Oncology.  Asked if she would rather have treatments at [facility] or [place].  She said she prefers [place] because she is not familiar with [facility].  Appointment given to see Dr. Alvy Bimler on 03/30/18 at 2 pm.  She verbalized agreement.

## 2018-03-24 NOTE — Assessment & Plan Note (Signed)
2 small pulmonary nodules noted LUL on recent PET  (looking for primary cancer as bx positive for metastatic squamous cell carcinoma of left inguinal node .  6 mm LUL nodule not hypermetabolic.  Once oncology work-up is complete.  Most likely will need yearly serial follow-up. Very low metabolic activity and left upper lobe nodularity noted.  Not consistent with primary. We will need serial follow-up as above once oncology work-up is complete we will discuss timing on next CT follow-up

## 2018-03-24 NOTE — Progress Notes (Signed)
@Patient  ID: Lorraine Gilbert, female    DOB: 24-Jan-1932, 85 y.o.   MRN: [id number]  Chief Complaint  Patient presents with  . Acute Visit    Cough     Referring provider: [name], MD  HPI: 85 year old female never smoker followed for cough variant asthma Medical hx significant for A FIB on Eliquis   TEST/EVENTS :  PFTs wnl 12/04/13 > ok to try off dulera - - Spirometry 04/24/2015  wnl off dulera / even fef 25-75 - NO 04/24/2015  = 17  -  - PFT's  01/06/2017  FEV1 1.59 (101 % ) ratio 86  p 4 % improvement from saba p symb 80 x 2 x 12h  prior to study with DLCO  67/68c % corrects to 100 % for alv volume   -   03/24/2018 Acute OV : Cough  Presents for an acute office visit.  Says that she was diagnosed with the flu with a positive swab on March 15, 2018.  She was treated with Tamiflu.  Patient says she is some better.  Continues to have cough and wheezing.  She denies any recent travel.  She denies any fever.  No body aches or rash. Appetite is down .   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  She has been referred to oncology.  PET scan done March 23, 2018 showed isolated intensely hypermetabolic enlarged left inguinal lymph node.  No clear primary identified.  No abnormal activity associated with the vagina or anorectal tissue.  No hypermetabolic lymph nodes in the pelvis.  Left upper lobe small 6 mm nodule with no metabolic activity.  adjacent pulmonary nodules in the left upper lobe with a very low metabolic activity.  Favored not to be primary malignancies.   Allergies  Allergen Reactions  . Tylenol With Codeine #3 [Acetaminophen-Codeine] Other (See Comments)    Makes patient feel like she is flying  . Ibuprofen Other (See Comments)    Confusion, Patient states that the 800mg  Motrin made her feel like she was flying.  . Morphine Rash and Other (See Comments)    Patient says it makes her feel like she is  flying.(Patient states on 03/15/2018, that she has never taken Morphine).    Immunization History  Administered Date(s) Administered  . Influenza Split 09/18/2013, 10/19/2014, 09/18/2016  . Influenza Whole 10/19/2011, 09/19/2015  . Influenza,inj,quad, With Preservative 10/18/2017  . Influenza-Unspecified 10/19/2014    Past Medical History:  Diagnosis Date  . A-fib ([place])   . Abnormal heart rhythm   . Allergic rhinitis   . Anxiety   . Asthma   . Diabetes mellitus without complication ([place])    Type II  . GERD (gastroesophageal reflux disease)   . Headache   . Hypertension   . Neuromuscular disorder ([place])    Siactic pain in right leg  . Osteopenia   . Rheumatoid arthritis ([place])   . Rotator cuff tear arthropathy    Right  . Vertigo     Tobacco History: Social History   Tobacco Use  Smoking Status Never Smoker  Smokeless Tobacco Never Used   Counseling given: Not Answered   Outpatient Medications Prior to Visit  Medication Sig Dispense Refill  . apixaban (ELIQUIS) 5 MG TABS tablet Take 5 mg by mouth 2 (two) times daily.    Marland Kitchen aspirin 81 MG tablet Take 81 mg by mouth every morning.     . Cholecalciferol (VITAMIN D3) 50000 units  CAPS Take 50,000 Units by mouth every Wednesday.    . digoxin (LANOXIN) 0.125 MG tablet Take 125 mcg by mouth every morning.     . diltiazem (CARDIZEM CD) 240 MG 24 hr capsule Take 240 mg by mouth daily.    . dorzolamide (TRUSOPT) 2 % ophthalmic solution Place 1 drop into both eyes 2 (two) times daily.    Marland Kitchen escitalopram (LEXAPRO) 20 MG tablet Take 20 mg by mouth daily.     . folic acid (FOLVITE) 1 MG tablet Take 1 mg by mouth every morning.     . furosemide (LASIX) 20 MG tablet Take 20 mg by mouth daily as needed for fluid.  11  . latanoprost (XALATAN) 0.005 % ophthalmic solution Place 1 drop into both eyes at bedtime.    . pantoprazole (PROTONIX) 40 MG tablet Take 30- 60 min before your first and last meals of the day 60 tablet 2  .  pentoxifylline (TRENTAL) 400 MG CR tablet Take 400 mg by mouth 3 (three) times daily with meals.    . rivastigmine (EXELON) 1.5 MG capsule Take 1 capsule (1.5 mg total) by mouth 2 (two) times daily. 60 capsule 11  . timolol (BETIMOL) 0.5 % ophthalmic solution Place 1 drop into both eyes daily.     . vitamin C (ASCORBIC ACID) 500 MG tablet Take 500 mg by mouth daily.     No facility-administered medications prior to visit.      Review of Systems:   Constitutional:   No  weight loss, night sweats,  Fevers, chills,  +fatigue, or  lassitude.  HEENT:   No headaches,  Difficulty swallowing,  Tooth/dental problems, or  Sore throat,                No sneezing, itching, ear ache,  +nasal congestion, post nasal drip,   CV:  No chest pain,  Orthopnea, PND, swelling in lower extremities, anasarca, dizziness, palpitations, syncope.   GI  No heartburn, indigestion, abdominal pain, nausea, vomiting, diarrhea, change in bowel habits, loss of appetite, bloody stools.   Resp:  No chest wall deformity  Skin: no rash or lesions.  GU: no dysuria, change in color of urine, no urgency or frequency.  No flank pain, no hematuria   MS:  No joint pain or swelling.  No decreased range of motion.  No back pain.    Physical Exam  BP (!) 128/54 (BP Location: Left Arm, Cuff Size: Normal)   Pulse 68   Temp 98.2 F (36.8 C) (Oral)   Ht 5\' 3"  (1.6 m)   Wt 131 lb 9.6 oz (59.7 kg)   SpO2 98%   BMI 23.31 kg/m   GEN: A/Ox3; pleasant , NAD, thin and frail    HEENT:  [place]/AT,  EACs-clear, TMs-wnl, NOSE-clear, THROAT-clear, no lesions, no postnasal drip or exudate noted.   NECK:  Supple w/ fair ROM; no JVD; normal carotid impulses w/o bruits; no thyromegaly or nodules palpated; no lymphadenopathy.    RESP few exp wheezing , speaks in full sentences  no accessory muscle use, no dullness to percussion  CARD:  RRR, no m/r/g, no peripheral edema, pulses intact, no cyanosis or clubbing.  GI:   Soft & nt; nml  bowel sounds; no organomegaly or masses detected.   Musco: Warm bil, no deformities or joint swelling noted.   Neuro: alert, no focal deficits noted.    Skin: Warm, no lesions or rashes    Lab Results:  CBC  BNP No  results found for: BNP  ProBNP No results found for: PROBNP  Imaging: Dg Chest 2 View  Result Date: 03/24/2018 CLINICAL DATA:  85 y/o  F; persistent cough. Recent influenza. EXAM: CHEST - 2 VIEW COMPARISON:  02/17/2017 chest radiograph. 03/23/2018 PET-CT. FINDINGS: Stable cardiac silhouette given projection and technique. Aortic atherosclerosis with calcification. No focal consolidation. No pleural effusion or pneumothorax. No acute osseous abnormality is evident. Right shoulder reverse hemiarthroplasty, partially visualized. IMPRESSION: No acute pulmonary process identified. Electronically Signed   By: Kristine Garbe M.D.   On: 03/24/2018 15:40   Nm Pet Image Initial (pi) Skull Base To Thigh  Result Date: 03/23/2018 CLINICAL DATA:  Initial treatment strategy for squamous cell carcinoma of unknown primary. Normal gyn anal examination. LEFT inguinal squamous cell carcinoma. EXAM: NUCLEAR MEDICINE PET SKULL BASE TO THIGH TECHNIQUE: 6.3 mCi F-18 FDG was injected intravenously. Full-ring PET imaging was performed from the skull base to thigh after the radiotracer. CT data was obtained and used for attenuation correction and anatomic localization. Fasting blood glucose: 86 mg/dl COMPARISON:  None 2.76 FINDINGS: Mediastinal blood pool activity: SUV max 2.76 NECK: No hypermetabolic lymph nodes in the neck. Incidental CT findings: none CHEST: Within LEFT upper lobe small 6 mm nodule does not have metabolic activity (image 76/7). Adjacent curvilinear nodularity (image 61/4) has very low metabolic activity (SUV max equal 2.0) which is less than mediastinal blood pool. No hypermetabolic mediastinal lymph nodes Incidental CT findings: Coronary artery calcification and aortic  atherosclerotic calcification. ABDOMEN/PELVIS: No abnormal hypermetabolic activity within the liver, pancreas, adrenal glands, or spleen. No hypermetabolic lymph nodes in the abdomen or pelvis. The uterus and adnexa are normal. The anorectal rectal tissue appears normal. There is intensely hypermetabolic LEFT inguinal lymph node which is enlarged to 1.4 x 2.6 cm. SUV max equal 8.4. No additional hypermetabolic nodes in the pelvis or inguinal nodal station Incidental CT findings: Atherosclerotic calcification of the aorta. SKELETON: No focal hypermetabolic activity to suggest skeletal metastasis. Incidental CT findings: None IMPRESSION: 1. Isolated intensely hypermetabolic enlarged LEFT inguinal lymph node. No clear primary identified. 2. No abnormal activity associated with the vagina or anorectal tissue. 3. No hypermetabolic lymph nodes in the pelvis. 4. Two adjacent nodules in the RIGHT upper lobe with very low metabolic activity. These are not favored primary malignancies. Recommend follow-up per Fleischner criteria. Non-contrast chest CT at 6-12 months is recommended. If the nodule is stable at time of repeat CT, then future CT at 18-24 months (from today's scan) is considered optional for low-risk patients, but is recommended for high-risk patients. This recommendation follows the consensus statement: Guidelines for Management of Incidental Pulmonary Nodules Detected on CT Images:From the Fleischner Society 2017; published online before print (10.1148/radiol.3419379024). Electronically Signed   By: Suzy Bouchard M.D.   On: 03/23/2018 16:43      PFT Results Latest Ref Rng & Units 01/06/2017 12/04/2013  FVC-Pre L 1.84 2.08  FVC-Predicted Pre % 86 91  FVC-Post L 1.84 1.91  FVC-Predicted Post % 86 84  Pre FEV1/FVC % % 83 83  Post FEV1/FCV % % 86 86  FEV1-Pre L 1.52 1.74  FEV1-Predicted Pre % 97 103  FEV1-Post L 1.59 1.64  DLCO UNC% % 67 79  DLCO COR %Predicted % 100 101  TLC L 4.62 4.64  TLC %  Predicted % 100 100  RV % Predicted % 121 102    Lab Results  Component Value Date   NITRICOXIDE 17 04/24/2015  Assessment & Plan:   Cough variant asthma vs UACS Flare with Bronchitis (post influenza )  Chest xray today with no acute process   xopenex neb x 1   Plan  Patient Instructions  Doxycycline 100mg  Twice daily  For 7 days -take with food  Prednisone 20mg  daily for 5 days and stop  Mucinex Twice daily  As needed  Cough/congestion .  Begin Symbicort Inhaler Sample 2 puffs Twice daily  Until gone then stop . -rinse after use.  Fluids and rest .  Claritin 10mg  .At bedtime  As needed  Drainage .  Will discuss further testing of lung nodules at that visit.  Follow up with Dr. Melvyn Novas in 4  weeks and As needed   Please contact office for sooner follow up if symptoms do not improve or worsen or seek emergency care       Pulmonary nodule 2 small pulmonary nodules noted LUL on recent PET  (looking for primary cancer as bx positive for metastatic squamous cell carcinoma of left inguinal node .  6 mm LUL nodule not hypermetabolic.  Once oncology work-up is complete.  Most likely will need yearly serial follow-up. Very low metabolic activity and left upper lobe nodularity noted.  Not consistent with primary. We will need serial follow-up as above once oncology work-up is complete we will discuss timing on next CT follow-up     Rexene Edison, NP 03/24/2018

## 2018-03-24 NOTE — Patient Instructions (Addendum)
Doxycycline 100mg  Twice daily  For 7 days -take with food  Prednisone 20mg  daily for 5 days and stop  Mucinex Twice daily  As needed  Cough/congestion .  Begin Symbicort Inhaler Sample 2 puffs Twice daily  Until gone then stop . -rinse after use.  Fluids and rest .  Claritin 10mg  .At bedtime  As needed  Drainage .  Will discuss further testing of lung nodules at that visit.  Follow up with Dr. Melvyn Novas in 4  weeks and As needed   Please contact office for sooner follow up if symptoms do not improve or worsen or seek emergency care

## 2018-03-29 DIAGNOSIS — R739 Hyperglycemia, unspecified: Secondary | ICD-10-CM | POA: Diagnosis not present

## 2018-03-29 DIAGNOSIS — E1139 Type 2 diabetes mellitus with other diabetic ophthalmic complication: Secondary | ICD-10-CM | POA: Diagnosis not present

## 2018-03-29 DIAGNOSIS — T380X5A Adverse effect of glucocorticoids and synthetic analogues, initial encounter: Secondary | ICD-10-CM | POA: Diagnosis not present

## 2018-03-30 ENCOUNTER — Encounter: Payer: Self-pay | Admitting: Oncology

## 2018-03-30 ENCOUNTER — Other Ambulatory Visit: Payer: Self-pay

## 2018-03-30 ENCOUNTER — Inpatient Hospital Stay: Payer: Medicare HMO | Attending: Gynecologic Oncology | Admitting: Hematology and Oncology

## 2018-03-30 ENCOUNTER — Encounter: Payer: Self-pay | Admitting: Hematology and Oncology

## 2018-03-30 VITALS — BP 161/51 | HR 73 | Temp 97.9°F | Resp 18 | Ht 63.0 in | Wt 131.8 lb

## 2018-03-30 DIAGNOSIS — R42 Dizziness and giddiness: Secondary | ICD-10-CM | POA: Diagnosis not present

## 2018-03-30 DIAGNOSIS — E119 Type 2 diabetes mellitus without complications: Secondary | ICD-10-CM

## 2018-03-30 DIAGNOSIS — I4891 Unspecified atrial fibrillation: Secondary | ICD-10-CM | POA: Diagnosis not present

## 2018-03-30 DIAGNOSIS — Z7982 Long term (current) use of aspirin: Secondary | ICD-10-CM | POA: Insufficient documentation

## 2018-03-30 DIAGNOSIS — K219 Gastro-esophageal reflux disease without esophagitis: Secondary | ICD-10-CM | POA: Insufficient documentation

## 2018-03-30 DIAGNOSIS — M069 Rheumatoid arthritis, unspecified: Secondary | ICD-10-CM | POA: Diagnosis not present

## 2018-03-30 DIAGNOSIS — Z7901 Long term (current) use of anticoagulants: Secondary | ICD-10-CM | POA: Diagnosis not present

## 2018-03-30 DIAGNOSIS — C774 Secondary and unspecified malignant neoplasm of inguinal and lower limb lymph nodes: Secondary | ICD-10-CM | POA: Diagnosis not present

## 2018-03-30 DIAGNOSIS — G3184 Mild cognitive impairment, so stated: Secondary | ICD-10-CM | POA: Insufficient documentation

## 2018-03-30 DIAGNOSIS — C779 Secondary and unspecified malignant neoplasm of lymph node, unspecified: Secondary | ICD-10-CM

## 2018-03-30 DIAGNOSIS — M8580 Other specified disorders of bone density and structure, unspecified site: Secondary | ICD-10-CM

## 2018-03-30 DIAGNOSIS — Z79899 Other long term (current) drug therapy: Secondary | ICD-10-CM

## 2018-03-30 DIAGNOSIS — I482 Chronic atrial fibrillation, unspecified: Secondary | ICD-10-CM

## 2018-03-30 DIAGNOSIS — C801 Malignant (primary) neoplasm, unspecified: Secondary | ICD-10-CM | POA: Diagnosis not present

## 2018-03-30 DIAGNOSIS — I10 Essential (primary) hypertension: Secondary | ICD-10-CM | POA: Diagnosis not present

## 2018-03-30 DIAGNOSIS — Z9181 History of falling: Secondary | ICD-10-CM | POA: Insufficient documentation

## 2018-03-31 ENCOUNTER — Encounter: Payer: Self-pay | Admitting: Hematology and Oncology

## 2018-03-31 ENCOUNTER — Encounter: Payer: Self-pay | Admitting: Oncology

## 2018-03-31 ENCOUNTER — Telehealth: Payer: Self-pay | Admitting: Oncology

## 2018-03-31 NOTE — Telephone Encounter (Signed)
Called Dr. Estell Harpin office at [facility] and requested records for patient's last colonoscopy.  She also has an appointment scheduled with Dr. Penelope Coop on 04/10/18 at 3 pm.

## 2018-03-31 NOTE — Assessment & Plan Note (Signed)
I have reviewed imaging study with the patient and her daughter-in-law The source of the squamous cell carcinoma is unknown We will get records from her gastroenterologist for further evaluation.  She has appointment to see her GI doctor to rule out anal cancer Due to lack of diffuse metastatic disease, I think this is surgically resectable I will refer her to general surgery for assessment I will also refer her to radiation oncology to discuss radiation treatment Given her frail status, I would not recommend concurrent chemotherapy due to high risk of side effects I will reserve chemotherapy only in the palliative setting

## 2018-03-31 NOTE — Progress Notes (Addendum)
[name] CONSULT NOTE  Patient Care Team: [name], MD as PCP - General (Family Medicine)  ASSESSMENT & PLAN:  Metastatic squamous cell carcinoma involving lymph node with unknown primary site [facility]) I have reviewed imaging study with the patient and her daughter-in-law The source of the squamous cell carcinoma is unknown We will get records from her gastroenterologist for further evaluation.  She has appointment to see her GI doctor to rule out anal cancer Due to lack of diffuse metastatic disease, I think this is surgically resectable I will refer her to general surgery for assessment I will also refer her to radiation oncology to discuss radiation treatment Given her frail status, I would not recommend concurrent chemotherapy due to high risk of side effects I will reserve chemotherapy only in the palliative setting  Mild cognitive impairment She has mild cognitive impairment but appears to be coping well.  Atrial fibrillation ([place]) She has chronic atrial fibrillation on chronic anticoagulation therapy I recommend her to make appointment to see her cardiologist due to recent dizziness causing a fall and possibility of needing cardiac clearance before she proceed with surgery.   Orders Placed This Encounter  Procedures  . Ambulatory referral to General Surgery    Referral Priority:   Urgent    Referral Type:   Surgical    Referral Reason:   Specialty Services Required    Referred to Provider:   Ralene Ok, MD    Requested Specialty:   General Surgery    Number of Visits Requested:   1  . Ambulatory referral to Radiation Oncology    Referral Priority:   Routine    Referral Type:   Consultation    Referral Reason:   Specialty Services Required    Referred to Provider:   Gery Pray, MD    Requested Specialty:   Radiation Oncology    Number of Visits Requested:   1     CHIEF COMPLAINTS/PURPOSE OF CONSULTATION:  Squamous cell carcinoma of the  inguinal lymph node, for further management  HISTORY OF PRESENTING ILLNESS:  Lorraine Gilbert 85 y.o. female is here because of recent findings of lymphadenopathy in the groin and diagnosis of squamous cell carcinoma This patient has background history of mild cognitive impairment, chronic atrial fibrillation on chronic anticoagulation therapy and medication management. She is here today with her daughter-in-law The patient palpated lymph node in the left groin and underwent extensive evaluation.  I have reviewed her chart and materials related to her cancer extensively and collaborated history with the patient. Summary of oncologic history is as follows:   Metastatic squamous cell carcinoma involving lymph node with unknown primary site [facility])   02/06/2018 Imaging    Impression CT abdomen and pelvis 1. Enlarged left groin lymph node which is of uncertain etiology. This would be amenable to percutaneous sampling if deemed clinically appropriate. 2. Hepatic steatosis. Focal area of low-attenuation in the far lateral left hepatic lobe, not clearly seen on the prior exam. Consider liver ultrasound for further evaluation.  3. Mild L1 compression deformity which is new.    02/28/2018 Pathology Results    Left groin lymph node, needle core biopsy:       -Metastatic squamous cell carcinoma, see comment  P16 stain is strongly positive, suggestive of cervical or anogenital origin in this location. Immunohistochemical stains for CK5, p63, and pan cytokeratin are positive. ER is weakly positive. CK7 and CK20 are negative.    02/28/2018 Procedure    Technically successful  fine-needle aspiration and core biopsy of an abnormal appearing left inguinal lymph node as described above.    03/15/2018 Pathology Results    1. Cervix, biopsy, 12 o'clock - BENIGN SQUAMOUS MUCOSA WITH INFLAMMATION. - NO DYSPLASIA OR MALIGNANCY. 2. Endocervix, curettage - BENIGN SQUAMOUS EPITHELIUM.    03/23/2018 PET scan    1.  Isolated intensely hypermetabolic enlarged LEFT inguinal lymph node. No clear primary identified. 2. No abnormal activity associated with the vagina or anorectal tissue. 3. No hypermetabolic lymph nodes in the pelvis. 4. Two adjacent nodules in the RIGHT upper lobe with very low metabolic activity. These are not favored primary malignancies. Recommend follow-up per Fleischner criteria. Non-contrast chest CT at 6-12 months is recommended.     With negative GYN findings, the GYN surgeon felt that this is not related to metastatic cervical cancer. She had negative colonoscopy in the past and has appointment pending to see her GI doctor She complained of mild occasional dizziness She see her cardiologist for medical management The patient denies any recent signs or symptoms of bleeding such as spontaneous epistaxis, hematuria or hematochezia. She complained of poor appetite but did not notice significant weight changes She had one recent fall a month ago due to dizziness  MEDICAL HISTORY:  Past Medical History:  Diagnosis Date  . A-fib ([place])   . Abnormal heart rhythm   . Allergic rhinitis   . Anxiety   . Asthma   . Diabetes mellitus without complication ([place])    Type II  . GERD (gastroesophageal reflux disease)   . Headache   . Hypertension   . Neuromuscular disorder ([place])    Siactic pain in right leg  . Osteopenia   . Rheumatoid arthritis ([place])   . Rotator cuff tear arthropathy    Right  . Vertigo     SURGICAL HISTORY: Past Surgical History:  Procedure Laterality Date  . CATARACT EXTRACTION     bilateral  . EYE SURGERY    . REVERSE SHOULDER ARTHROPLASTY Right 08/04/2017  . REVERSE SHOULDER ARTHROPLASTY Right 08/04/2017   Procedure: RIGHT REVERSE SHOULDER ARTHROPLASTY;  Surgeon: Justice Britain, MD;  Location: [place];  Service: Orthopedics;  Laterality: Right;  . TUBAL LIGATION      SOCIAL HISTORY: Social History   Socioeconomic History  . Marital status: Married     Spouse name: Kathi Ludwig  . Number of children: 6  . Years of education: Not on file  . Highest education level: Not on file  Occupational History  . Occupation: Retired Pharmacist, hospital Needs  . Financial resource strain: Not on file  . Food insecurity:    Worry: Not on file    Inability: Not on file  . Transportation needs:    Medical: Not on file    Non-medical: Not on file  Tobacco Use  . Smoking status: Never Smoker  . Smokeless tobacco: Never Used  Substance and Sexual Activity  . Alcohol use: No    Alcohol/week: 0.0 standard drinks  . Drug use: No  . Sexual activity: Not on file  Lifestyle  . Physical activity:    Days per week: Not on file    Minutes per session: Not on file  . Stress: Not on file  Relationships  . Social connections:    Talks on phone: Not on file    Gets together: Not on file    Attends religious service: Not on file    Active member of club or organization: Not on file  Attends meetings of clubs or organizations: Not on file    Relationship status: Not on file  . Intimate partner violence:    Fear of current or ex partner: Not on file    Emotionally abused: Not on file    Physically abused: Not on file    Forced sexual activity: Not on file  Other Topics Concern  . Not on file  Social History Narrative  . Not on file    FAMILY HISTORY: Family History  Problem Relation Age of Onset  . Emphysema Father        smoked  . Heart disease Mother   . Heart attack Son   . Breast cancer Neg Hx     ALLERGIES:  is allergic to tylenol with codeine #3 [acetaminophen-codeine]; ibuprofen; and morphine.  MEDICATIONS:  Current Outpatient Medications  Medication Sig Dispense Refill  . apixaban (ELIQUIS) 5 MG TABS tablet Take 5 mg by mouth 2 (two) times daily.    Marland Kitchen aspirin 81 MG tablet Take 81 mg by mouth every morning.     . budesonide-formoterol (SYMBICORT) 160-4.5 MCG/ACT inhaler Inhale 2 puffs into the lungs 2 (two) times daily. 1  Inhaler 0  . Cholecalciferol (VITAMIN D3) 50000 units CAPS Take 50,000 Units by mouth every Wednesday.    . digoxin (LANOXIN) 0.125 MG tablet Take 125 mcg by mouth every morning.     . diltiazem (CARDIZEM CD) 240 MG 24 hr capsule Take 240 mg by mouth daily.    . dorzolamide (TRUSOPT) 2 % ophthalmic solution Place 1 drop into both eyes 2 (two) times daily.    Marland Kitchen doxycycline (VIBRA-TABS) 100 MG tablet Take 1 tablet (100 mg total) by mouth 2 (two) times daily. 14 tablet 0  . escitalopram (LEXAPRO) 20 MG tablet Take 20 mg by mouth daily.     . folic acid (FOLVITE) 1 MG tablet Take 1 mg by mouth every morning.     . furosemide (LASIX) 20 MG tablet Take 20 mg by mouth daily as needed for fluid.  11  . latanoprost (XALATAN) 0.005 % ophthalmic solution Place 1 drop into both eyes at bedtime.    . pantoprazole (PROTONIX) 40 MG tablet Take 30- 60 min before your first and last meals of the day 60 tablet 2  . pentoxifylline (TRENTAL) 400 MG CR tablet Take 400 mg by mouth 3 (three) times daily with meals.    . rivastigmine (EXELON) 1.5 MG capsule Take 1 capsule (1.5 mg total) by mouth 2 (two) times daily. 60 capsule 11  . timolol (BETIMOL) 0.5 % ophthalmic solution Place 1 drop into both eyes daily.     . vitamin C (ASCORBIC ACID) 500 MG tablet Take 500 mg by mouth daily.     No current facility-administered medications for this visit.     REVIEW OF SYSTEMS:   Constitutional: Denies fevers, chills or abnormal night sweats Eyes: Denies blurriness of vision, double vision or watery eyes Ears, nose, mouth, throat, and face: Denies mucositis or sore throat Respiratory: Denies cough, dyspnea or wheezes Cardiovascular: Denies palpitation, chest discomfort or lower extremity swelling Gastrointestinal:  Denies nausea, heartburn or change in bowel habits Skin: Denies abnormal skin rashes Behavioral/Psych: Mood is stable, no new changes  All other systems were reviewed with the patient and are  negative.  PHYSICAL EXAMINATION: ECOG PERFORMANCE STATUS: 1 - Symptomatic but completely ambulatory  Vitals:   03/30/18 1440  BP: (!) 161/51  Pulse: 73  Resp: 18  Temp: 97.9 F (36.6  C)  SpO2: 100%   Filed Weights   03/30/18 1440  Weight: 131 lb 12.8 oz (59.8 kg)    GENERAL:alert, no distress and comfortable.  She looks elderly and frail SKIN: skin color, texture, turgor are normal, no rashes or significant lesions EYES: normal, conjunctiva are pink and non-injected, sclera clear OROPHARYNX:no exudate, no erythema and lips, buccal mucosa, and tongue normal  NECK: supple, thyroid normal size, non-tender, without nodularity LYMPH: She has palpable lymphadenopathy in the groin region  lUNGS: clear to auscultation and percussion with normal breathing effort HEART: irregular rate & rhythm and no murmurs and no lower extremity edema ABDOMEN:abdomen soft, non-tender and normal bowel sounds Musculoskeletal:no cyanosis of digits and no clubbing  PSYCH: alert & oriented x 3 with fluent speech NEURO: no focal motor/sensory deficits I examined her perianal region and did not see any abnormalities.  LABORATORY DATA:  I have reviewed the data as listed Lab Results  Component Value Date   WBC 9.4 07/28/2017   HGB 13.6 07/28/2017   HCT 43.6 07/28/2017   MCV 94.6 07/28/2017   PLT 256 07/28/2017   Recent Labs    07/28/17 1340  NA 139  K 4.8  CL 107  CO2 24  GLUCOSE 179*  BUN 15  CREATININE 0.89  CALCIUM 9.3  GFRNONAA 58*  GFRAA >60    RADIOGRAPHIC STUDIES: I have reviewed imaging study with the patient and family I have personally reviewed the radiological images as listed and agreed with the findings in the report. Dg Chest 2 View  Result Date: 03/24/2018 CLINICAL DATA:  85 y/o  F; persistent cough. Recent influenza. EXAM: CHEST - 2 VIEW COMPARISON:  02/17/2017 chest radiograph. 03/23/2018 PET-CT. FINDINGS: Stable cardiac silhouette given projection and technique. Aortic  atherosclerosis with calcification. No focal consolidation. No pleural effusion or pneumothorax. No acute osseous abnormality is evident. Right shoulder reverse hemiarthroplasty, partially visualized. IMPRESSION: No acute pulmonary process identified. Electronically Signed   By: Kristine Garbe M.D.   On: 03/24/2018 15:40   Nm Pet Image Initial (pi) Skull Base To Thigh  Result Date: 03/23/2018 CLINICAL DATA:  Initial treatment strategy for squamous cell carcinoma of unknown primary. Normal gyn anal examination. LEFT inguinal squamous cell carcinoma. EXAM: NUCLEAR MEDICINE PET SKULL BASE TO THIGH TECHNIQUE: 6.3 mCi F-18 FDG was injected intravenously. Full-ring PET imaging was performed from the skull base to thigh after the radiotracer. CT data was obtained and used for attenuation correction and anatomic localization. Fasting blood glucose: 86 mg/dl COMPARISON:  None 2.76 FINDINGS: Mediastinal blood pool activity: SUV max 2.76 NECK: No hypermetabolic lymph nodes in the neck. Incidental CT findings: none CHEST: Within LEFT upper lobe small 6 mm nodule does not have metabolic activity (image 08/6). Adjacent curvilinear nodularity (image 61/4) has very low metabolic activity (SUV max equal 2.0) which is less than mediastinal blood pool. No hypermetabolic mediastinal lymph nodes Incidental CT findings: Coronary artery calcification and aortic atherosclerotic calcification. ABDOMEN/PELVIS: No abnormal hypermetabolic activity within the liver, pancreas, adrenal glands, or spleen. No hypermetabolic lymph nodes in the abdomen or pelvis. The uterus and adnexa are normal. The anorectal rectal tissue appears normal. There is intensely hypermetabolic LEFT inguinal lymph node which is enlarged to 1.4 x 2.6 cm. SUV max equal 8.4. No additional hypermetabolic nodes in the pelvis or inguinal nodal station Incidental CT findings: Atherosclerotic calcification of the aorta. SKELETON: No focal hypermetabolic activity to  suggest skeletal metastasis. Incidental CT findings: None IMPRESSION: 1. Isolated intensely hypermetabolic enlarged LEFT inguinal  lymph node. No clear primary identified. 2. No abnormal activity associated with the vagina or anorectal tissue. 3. No hypermetabolic lymph nodes in the pelvis. 4. Two adjacent nodules in the RIGHT upper lobe with very low metabolic activity. These are not favored primary malignancies. Recommend follow-up per Fleischner criteria. Non-contrast chest CT at 6-12 months is recommended. If the nodule is stable at time of repeat CT, then future CT at 18-24 months (from today's scan) is considered optional for low-risk patients, but is recommended for high-risk patients. This recommendation follows the consensus statement: Guidelines for Management of Incidental Pulmonary Nodules Detected on CT Images:From the Fleischner Society 2017; published online before print (10.1148/radiol.7342876811). Electronically Signed   By: Suzy Bouchard M.D.   On: 03/23/2018 16:43    I spent 55 minutes counseling the patient face to face. The total time spent in the appointment was 60 minutes and more than 50% was on counseling.  All questions were answered. The patient knows to call the clinic with any problems, questions or concerns.  Heath Lark, MD 03/31/2018 9:38 AM

## 2018-03-31 NOTE — Assessment & Plan Note (Signed)
She has mild cognitive impairment but appears to be coping well.

## 2018-03-31 NOTE — Progress Notes (Signed)
Left a message with Judson Roch - Referral Coordinator at [place].  Requested a return call.

## 2018-03-31 NOTE — Assessment & Plan Note (Addendum)
She has chronic atrial fibrillation on chronic anticoagulation therapy I recommend her to make appointment to see her cardiologist due to recent dizziness causing a fall and possibility of needing cardiac clearance before she proceed with surgery.

## 2018-03-31 NOTE — Progress Notes (Signed)
Faxed urgent referral to [facility].

## 2018-04-03 ENCOUNTER — Telehealth: Payer: Self-pay | Admitting: Oncology

## 2018-04-03 ENCOUNTER — Telehealth: Payer: Self-pay | Admitting: Radiation Oncology

## 2018-04-03 DIAGNOSIS — F0390 Unspecified dementia without behavioral disturbance: Secondary | ICD-10-CM | POA: Diagnosis not present

## 2018-04-03 DIAGNOSIS — E1139 Type 2 diabetes mellitus with other diabetic ophthalmic complication: Secondary | ICD-10-CM | POA: Diagnosis not present

## 2018-04-03 DIAGNOSIS — J45991 Cough variant asthma: Secondary | ICD-10-CM | POA: Diagnosis not present

## 2018-04-03 DIAGNOSIS — J45909 Unspecified asthma, uncomplicated: Secondary | ICD-10-CM | POA: Diagnosis not present

## 2018-04-03 DIAGNOSIS — E1169 Type 2 diabetes mellitus with other specified complication: Secondary | ICD-10-CM | POA: Diagnosis not present

## 2018-04-03 DIAGNOSIS — M069 Rheumatoid arthritis, unspecified: Secondary | ICD-10-CM | POA: Diagnosis not present

## 2018-04-03 DIAGNOSIS — E1165 Type 2 diabetes mellitus with hyperglycemia: Secondary | ICD-10-CM | POA: Diagnosis not present

## 2018-04-03 DIAGNOSIS — I4891 Unspecified atrial fibrillation: Secondary | ICD-10-CM | POA: Diagnosis not present

## 2018-04-03 DIAGNOSIS — H409 Unspecified glaucoma: Secondary | ICD-10-CM | POA: Diagnosis not present

## 2018-04-03 NOTE — Telephone Encounter (Signed)
Received call from [place], patient's daughter in law.  She said they were told to go to the cardiologist first before all other appointments.  She has canceled the appointment with Dr. Sondra Come tomorrow and will call back to reschedule on Thursday.  Also advised her about the appointment with Dr. Barry Dienes on 04/14/18 at 11:00 am.  She verbalized agreement and understanding.

## 2018-04-03 NOTE — Telephone Encounter (Signed)
New message:     Pt states they need to see a cardiologist first befroe preceeding with this appt.

## 2018-04-03 NOTE — Telephone Encounter (Signed)
Ok thanks 

## 2018-04-03 NOTE — Telephone Encounter (Signed)
[facility] Surgery regarding referral.  Patient has an appointment on 04/14/18 at 11 am with Dr. Barry Dienes.

## 2018-04-04 ENCOUNTER — Inpatient Hospital Stay
Admission: RE | Admit: 2018-04-04 | Discharge: 2018-04-04 | Disposition: A | Discharge status: Home / Self Care | Payer: Medicare HMO | Source: Ambulatory Visit | Attending: Radiation Oncology | Admitting: Radiation Oncology

## 2018-04-06 DIAGNOSIS — R0609 Other forms of dyspnea: Secondary | ICD-10-CM | POA: Diagnosis not present

## 2018-04-06 DIAGNOSIS — E782 Mixed hyperlipidemia: Secondary | ICD-10-CM | POA: Diagnosis not present

## 2018-04-06 DIAGNOSIS — I480 Paroxysmal atrial fibrillation: Secondary | ICD-10-CM | POA: Diagnosis not present

## 2018-04-06 DIAGNOSIS — I272 Pulmonary hypertension, unspecified: Secondary | ICD-10-CM | POA: Diagnosis not present

## 2018-04-06 DIAGNOSIS — I351 Nonrheumatic aortic (valve) insufficiency: Secondary | ICD-10-CM | POA: Diagnosis not present

## 2018-04-06 DIAGNOSIS — I10 Essential (primary) hypertension: Secondary | ICD-10-CM | POA: Diagnosis not present

## 2018-04-06 NOTE — Progress Notes (Signed)
Chart and office note reviewed in detail  > agree with a/p as outlined    

## 2018-04-14 ENCOUNTER — Other Ambulatory Visit: Payer: Self-pay | Admitting: General Surgery

## 2018-04-14 DIAGNOSIS — C779 Secondary and unspecified malignant neoplasm of lymph node, unspecified: Secondary | ICD-10-CM | POA: Diagnosis not present

## 2018-04-21 ENCOUNTER — Ambulatory Visit: Payer: Medicare HMO | Admitting: Internal Medicine

## 2018-04-21 ENCOUNTER — Other Ambulatory Visit: Payer: Self-pay

## 2018-04-21 ENCOUNTER — Ambulatory Visit (INDEPENDENT_AMBULATORY_CARE_PROVIDER_SITE_OTHER): Payer: Medicare HMO | Admitting: Primary Care

## 2018-04-21 ENCOUNTER — Encounter: Payer: Self-pay | Admitting: Primary Care

## 2018-04-21 DIAGNOSIS — J45991 Cough variant asthma: Secondary | ICD-10-CM | POA: Diagnosis not present

## 2018-04-21 DIAGNOSIS — R058 Other specified cough: Secondary | ICD-10-CM

## 2018-04-21 DIAGNOSIS — R911 Solitary pulmonary nodule: Secondary | ICD-10-CM | POA: Diagnosis not present

## 2018-04-21 DIAGNOSIS — R918 Other nonspecific abnormal finding of lung field: Secondary | ICD-10-CM

## 2018-04-21 DIAGNOSIS — R05 Cough: Secondary | ICD-10-CM

## 2018-04-21 MED ORDER — BENZONATATE 200 MG PO CAPS: 200.0000 mg | ORAL_CAPSULE | Freq: Three times a day (TID) | ORAL | 1 refills | Status: DC | PRN

## 2018-04-21 NOTE — Patient Instructions (Signed)
  Cough variant asthma - Symptoms favor upper airway cough syndrome  - Take protonix 40mg  TWICE daily  - Take Chlorpheniramine 4mg  every 4 hours as needed for cough (available over the counter, may cause drowsiness so start at bedtime) - Refill tessalon perles for cough, continue delsym cough syrup and add mucinex twice a day   Pulmonary nodule: - Recommend annual serial follow-up  - CT chest with contrast ordered for March 2020

## 2018-04-21 NOTE — Progress Notes (Signed)
Virtual Visit via Telephone Note  I connected with Lorraine Gilbert on 04/21/18 at 10:30 AM EDT by telephone and verified that I am speaking with the correct person using two identifiers.   I discussed the limitations, risks, security and privacy concerns of performing an evaluation and management service by telephone and the availability of in person appointments. I also discussed with the patient that there may be a patient responsible charge related to this service. The patient expressed understanding and agreed to proceed.  Patient at home, agreeing to [employer]. Myself and Lorraine Gilbert present on phone call. My nurse lisa to set up follow-up and mail AVS.  History of Present Illness: 85 year old female, never smoked. PMH significant for cough variant asthma, afib. Patient of Dr. Melvyn Novas, last seen on 03/24/18 with NP for cough/influenza. Treated for post-viral bronchitis with doxycycline and prednisone 20mg  x 5 days. CXR showed no acute process.   Saw Oncology on 3/12 for metastatic squamous cell carcinoma involving left groin lymph node with unknown primary site, believed to be surgically resectable. She was referred to general surgery and radiation oncology. Concurrent chemotherapy not recommended.  04/21/2018 Patient called today for 4 week follow-up visit. States that she is doing ok except for contast coughing x 1.5 weeks. Occasional wheezing. She is unsure if Symbicort has helped her and states that it is two expensive, she has 1 day left on sample/ She is not sure if abx or prednisone helped. States that prednisone caused her blood sugar readings to go up. She has been coughing every night which is annoying her. Gets up clear mucus. No nasal congestion or GERD symptoms. She has been taking Protonix everyday before breakfast only.  A little wheezing   Observations/Objective:  - Dry cough. No shortness of breath or wheezing noted.   Assessment and Plan:  Cough variant asthma - Symptoms favor  upper airway cough syndrome  - Take protonix 40mg  TWICE daily  - Take Chlorpheniramine 4mg  every 4 hours as needed for cough (available over the counter, may cause drowsiness so start at bedtime) - Refill tessalon perles for cough, continue delsym cough syrup and add mucinex twice a day   Pulmonary nodule: - 2 small pulmonary nodules noted to left upper lobe on recent PET scan (69mm LUL nodule not hypermetabolic) - Very low metabolic activity, not consistent with primary source - Recommend annual serial follow-up  - CT chest with contrast ordered for March 2020   Follow Up Instructions:   3-4 months with Dr. Melvyn Novas  I discussed the assessment and treatment plan with the patient. The patient was provided an opportunity to ask questions and all were answered. The patient agreed with the plan and demonstrated an understanding of the instructions.   The patient was advised to call back or seek an in-person evaluation if the symptoms worsen or if the condition fails to improve as anticipated.  I provided 22 minutes of non-face-to-face time during this encounter.   Martyn Ehrich, NP

## 2018-04-25 NOTE — Progress Notes (Signed)
Chart and office note reviewed in detail  > agree with a/p as outlined    

## 2018-05-03 DIAGNOSIS — I480 Paroxysmal atrial fibrillation: Secondary | ICD-10-CM | POA: Diagnosis not present

## 2018-05-03 DIAGNOSIS — R0600 Dyspnea, unspecified: Secondary | ICD-10-CM | POA: Diagnosis not present

## 2018-05-05 DIAGNOSIS — R9439 Abnormal result of other cardiovascular function study: Secondary | ICD-10-CM | POA: Diagnosis not present

## 2018-05-05 DIAGNOSIS — I480 Paroxysmal atrial fibrillation: Secondary | ICD-10-CM | POA: Diagnosis not present

## 2018-05-05 DIAGNOSIS — I351 Nonrheumatic aortic (valve) insufficiency: Secondary | ICD-10-CM | POA: Diagnosis not present

## 2018-05-12 DIAGNOSIS — Z79899 Other long term (current) drug therapy: Secondary | ICD-10-CM | POA: Diagnosis not present

## 2018-05-12 DIAGNOSIS — I2584 Coronary atherosclerosis due to calcified coronary lesion: Secondary | ICD-10-CM | POA: Diagnosis not present

## 2018-05-12 DIAGNOSIS — Z7901 Long term (current) use of anticoagulants: Secondary | ICD-10-CM | POA: Diagnosis not present

## 2018-05-12 DIAGNOSIS — I272 Pulmonary hypertension, unspecified: Secondary | ICD-10-CM | POA: Diagnosis not present

## 2018-05-12 DIAGNOSIS — I10 Essential (primary) hypertension: Secondary | ICD-10-CM | POA: Diagnosis not present

## 2018-05-12 DIAGNOSIS — I2510 Atherosclerotic heart disease of native coronary artery without angina pectoris: Secondary | ICD-10-CM | POA: Diagnosis not present

## 2018-05-12 DIAGNOSIS — I480 Paroxysmal atrial fibrillation: Secondary | ICD-10-CM | POA: Diagnosis not present

## 2018-05-12 DIAGNOSIS — R9439 Abnormal result of other cardiovascular function study: Secondary | ICD-10-CM | POA: Diagnosis not present

## 2018-05-12 DIAGNOSIS — Z7982 Long term (current) use of aspirin: Secondary | ICD-10-CM | POA: Diagnosis not present

## 2018-05-12 DIAGNOSIS — I351 Nonrheumatic aortic (valve) insufficiency: Secondary | ICD-10-CM | POA: Diagnosis not present

## 2018-05-12 DIAGNOSIS — E782 Mixed hyperlipidemia: Secondary | ICD-10-CM | POA: Diagnosis not present

## 2018-05-13 DIAGNOSIS — Z7982 Long term (current) use of aspirin: Secondary | ICD-10-CM | POA: Diagnosis not present

## 2018-05-13 DIAGNOSIS — Z7901 Long term (current) use of anticoagulants: Secondary | ICD-10-CM | POA: Diagnosis not present

## 2018-05-13 DIAGNOSIS — E782 Mixed hyperlipidemia: Secondary | ICD-10-CM | POA: Diagnosis not present

## 2018-05-13 DIAGNOSIS — I351 Nonrheumatic aortic (valve) insufficiency: Secondary | ICD-10-CM | POA: Diagnosis not present

## 2018-05-13 DIAGNOSIS — Z79899 Other long term (current) drug therapy: Secondary | ICD-10-CM | POA: Diagnosis not present

## 2018-05-13 DIAGNOSIS — I272 Pulmonary hypertension, unspecified: Secondary | ICD-10-CM | POA: Diagnosis not present

## 2018-05-13 DIAGNOSIS — I10 Essential (primary) hypertension: Secondary | ICD-10-CM | POA: Diagnosis not present

## 2018-05-13 DIAGNOSIS — I480 Paroxysmal atrial fibrillation: Secondary | ICD-10-CM | POA: Diagnosis not present

## 2018-05-13 DIAGNOSIS — I2510 Atherosclerotic heart disease of native coronary artery without angina pectoris: Secondary | ICD-10-CM | POA: Diagnosis not present

## 2018-05-13 DIAGNOSIS — R9439 Abnormal result of other cardiovascular function study: Secondary | ICD-10-CM | POA: Diagnosis not present

## 2018-05-14 DIAGNOSIS — E782 Mixed hyperlipidemia: Secondary | ICD-10-CM | POA: Diagnosis not present

## 2018-05-14 DIAGNOSIS — Z7982 Long term (current) use of aspirin: Secondary | ICD-10-CM | POA: Diagnosis not present

## 2018-05-14 DIAGNOSIS — I2510 Atherosclerotic heart disease of native coronary artery without angina pectoris: Secondary | ICD-10-CM | POA: Diagnosis not present

## 2018-05-14 DIAGNOSIS — I480 Paroxysmal atrial fibrillation: Secondary | ICD-10-CM | POA: Diagnosis not present

## 2018-05-14 DIAGNOSIS — I10 Essential (primary) hypertension: Secondary | ICD-10-CM | POA: Diagnosis not present

## 2018-05-14 DIAGNOSIS — Z79899 Other long term (current) drug therapy: Secondary | ICD-10-CM | POA: Diagnosis not present

## 2018-05-14 DIAGNOSIS — I351 Nonrheumatic aortic (valve) insufficiency: Secondary | ICD-10-CM | POA: Diagnosis not present

## 2018-05-14 DIAGNOSIS — I272 Pulmonary hypertension, unspecified: Secondary | ICD-10-CM | POA: Diagnosis not present

## 2018-05-14 DIAGNOSIS — Z7901 Long term (current) use of anticoagulants: Secondary | ICD-10-CM | POA: Diagnosis not present

## 2018-05-18 DIAGNOSIS — C779 Secondary and unspecified malignant neoplasm of lymph node, unspecified: Secondary | ICD-10-CM | POA: Diagnosis not present

## 2018-05-18 DIAGNOSIS — C801 Malignant (primary) neoplasm, unspecified: Secondary | ICD-10-CM | POA: Diagnosis not present

## 2018-05-18 DIAGNOSIS — I2510 Atherosclerotic heart disease of native coronary artery without angina pectoris: Secondary | ICD-10-CM | POA: Diagnosis not present

## 2018-05-24 ENCOUNTER — Telehealth: Payer: Self-pay | Admitting: Oncology

## 2018-05-24 NOTE — Telephone Encounter (Signed)
Called Lorraine Gilbert to see how she is doing.  She said she is doing good and is starting cardiac rehab tomorrow.  Asked if she would like to have an appointment scheduled with Dr. Sondra Come in Radiation Oncology.  She said she would and to email the appointment information to [name].

## 2018-05-25 ENCOUNTER — Encounter: Payer: Self-pay | Admitting: Oncology

## 2018-05-25 NOTE — Progress Notes (Signed)
Histology and Location of Primary Cancer: 02/28/18 at [facility]:  Left groin lymph node, needle core biopsy: -Metastatic squamous cell carcinoma  Location(s) of Symptomatic tumor(s): LEFT inguinal lymph node  Past/Anticipated chemotherapy by medical oncology, if any: Per Dr. Alvy Bimler 03/30/18:  ASSESSMENT & PLAN:  Metastatic squamous cell carcinoma involving lymph node with unknown primary site [facility]) I have reviewed imaging study with the patient and her daughter-in-law The source of the squamous cell carcinoma is unknown We will get records from her gastroenterologist for further evaluation.  She has appointment to see her GI doctor to rule out anal cancer Due to lack of diffuse metastatic disease, I think this is surgically resectable I will refer her to general surgery for assessment I will also refer her to radiation oncology to discuss radiation treatment Given her frail status, I would not recommend concurrent chemotherapy due to high risk of side effects I will reserve chemotherapy only in the palliative setting  Mild cognitive impairment She has mild cognitive impairment but appears to be coping well.  Atrial fibrillation ([place]) She has chronic atrial fibrillation on chronic anticoagulation therapy I recommend her to make appointment to see her cardiologist due to recent dizziness causing a fall and possibility of needing cardiac clearance before she proceed with surgery.  Patient's main complaints related to symptomatic tumor(s) are: Pt has no c/o related to involved lymph node. Was found on imaging per pt.  Pain on a scale of 0-10 is: Pt denies c/o pain related to left inguinal lymph node. Pt does c/o bilateral knee pain, calf pain, and sciatica pain, rated 6/10.    Ambulatory status? Walker? Wheelchair?: Pt with unsteady gait, using single point cane.   SAFETY ISSUES:  Prior radiation? No  Pacemaker/ICD? No  Possible current pregnancy? No  Is the patient on  methotrexate? No  Additional Complaints / other details:  Pt presents today for initial consult with Dr. Sondra Come. Pt is accompanied by NT from Med Onc. Pt with [facility].   BP (!) 159/39 (BP Location: Left Arm)   Pulse 61   Temp 99.1 F (37.3 C) (Oral)   Resp 18   Ht 5\' 3"  (1.6 m)   Wt 131 lb 8 oz (59.6 kg)   SpO2 100%   BMI 23.29 kg/m   Wt Readings from Last 3 Encounters:  05/29/18 131 lb 8 oz (59.6 kg)  03/30/18 131 lb 12.8 oz (59.8 kg)  03/24/18 131 lb 9.6 oz (59.7 kg)   Loma Sousa, RN BSN

## 2018-05-25 NOTE — Progress Notes (Signed)
Sent an email per patient's request to [place] with Radiation appointment details.

## 2018-05-25 NOTE — Progress Notes (Signed)
Called [place] [facility] and advised her of appointment for Radiation on 05/29/18 at 1:30 pm.  She verbalized understanding and agreement.

## 2018-05-29 ENCOUNTER — Ambulatory Visit
Admission: RE | Admit: 2018-05-29 | Discharge: 2018-05-29 | Disposition: A | Discharge status: Home / Self Care | Payer: Medicare HMO | Source: Ambulatory Visit | Attending: Radiation Oncology | Admitting: Radiation Oncology

## 2018-05-29 ENCOUNTER — Other Ambulatory Visit: Payer: Self-pay

## 2018-05-29 ENCOUNTER — Encounter: Payer: Self-pay | Admitting: Radiation Oncology

## 2018-05-29 VITALS — BP 159/39 | HR 61 | Temp 99.1°F | Resp 18 | Ht 63.0 in | Wt 131.5 lb

## 2018-05-29 DIAGNOSIS — C801 Malignant (primary) neoplasm, unspecified: Secondary | ICD-10-CM

## 2018-05-29 DIAGNOSIS — F419 Anxiety disorder, unspecified: Secondary | ICD-10-CM | POA: Insufficient documentation

## 2018-05-29 DIAGNOSIS — R918 Other nonspecific abnormal finding of lung field: Secondary | ICD-10-CM | POA: Insufficient documentation

## 2018-05-29 DIAGNOSIS — C774 Secondary and unspecified malignant neoplasm of inguinal and lower limb lymph nodes: Secondary | ICD-10-CM | POA: Insufficient documentation

## 2018-05-29 DIAGNOSIS — K219 Gastro-esophageal reflux disease without esophagitis: Secondary | ICD-10-CM | POA: Insufficient documentation

## 2018-05-29 DIAGNOSIS — M8580 Other specified disorders of bone density and structure, unspecified site: Secondary | ICD-10-CM | POA: Diagnosis not present

## 2018-05-29 DIAGNOSIS — I4891 Unspecified atrial fibrillation: Secondary | ICD-10-CM | POA: Insufficient documentation

## 2018-05-29 DIAGNOSIS — J45909 Unspecified asthma, uncomplicated: Secondary | ICD-10-CM | POA: Insufficient documentation

## 2018-05-29 DIAGNOSIS — Z79899 Other long term (current) drug therapy: Secondary | ICD-10-CM | POA: Insufficient documentation

## 2018-05-29 DIAGNOSIS — M069 Rheumatoid arthritis, unspecified: Secondary | ICD-10-CM | POA: Diagnosis not present

## 2018-05-29 DIAGNOSIS — C779 Secondary and unspecified malignant neoplasm of lymph node, unspecified: Secondary | ICD-10-CM

## 2018-05-29 DIAGNOSIS — I10 Essential (primary) hypertension: Secondary | ICD-10-CM | POA: Diagnosis not present

## 2018-05-29 DIAGNOSIS — Z7982 Long term (current) use of aspirin: Secondary | ICD-10-CM | POA: Diagnosis not present

## 2018-05-29 DIAGNOSIS — E119 Type 2 diabetes mellitus without complications: Secondary | ICD-10-CM | POA: Insufficient documentation

## 2018-05-29 DIAGNOSIS — Z7901 Long term (current) use of anticoagulants: Secondary | ICD-10-CM | POA: Insufficient documentation

## 2018-05-29 DIAGNOSIS — R42 Dizziness and giddiness: Secondary | ICD-10-CM | POA: Insufficient documentation

## 2018-05-29 DIAGNOSIS — I2510 Atherosclerotic heart disease of native coronary artery without angina pectoris: Secondary | ICD-10-CM | POA: Diagnosis not present

## 2018-05-29 NOTE — Progress Notes (Signed)
Radiation Oncology         [phone] ________________________________  Initial Outpatient Consultation  Name: Lorraine Gilbert MRN: [id number]  Date: 05/29/2018  DOB: 04-20-1932  ON:GEXBMW, Shanon Brow, MD  [name], MD   REFERRING PHYSICIAN: [name], MD  DIAGNOSIS: The encounter diagnosis was Metastatic squamous cell carcinoma involving lymph node with unknown primary site [facility]).   Metastatic squamous cell carcinoma presenting in the left inguinal area (unknown primary site)   HISTORY OF PRESENT ILLNESS::Lorraine Gilbert is a 85 y.o. female who is presenting to the office today for evaluation of her metastatic squamous cell carcinoma with unknown primary.  She has met with Dr. Denman George, Dr. Melvyn Novas, as well as Dr. Alvy Bimler. Since GYN findings were negative, Dr. Denman George does not suspect her malignancy to be related to metastatic cervical cancer. Pt did see Dr. Stark Klein, with [facility], on her examination, pt was noted to have a 2-3 cm lymph node in the left inguinal area, right at the inguinal ligament. Dr. Barry Dienes felt this area could be excised and recommended deep excision of the lymph node. Prior to scheduling this surgery, the pt was to see [facility] cardiology in [place] and was set up for a stress test on April 15th, which was abnormal. On April 25th, the patient underwent cardiac catheterization. Pt was found to have single-vessel obstructive coronary artery disease, in the mid-to-distal LAD. The pt did have successful intervention to this area, with placement of a bare-metal stent. The recommendation after the procedure was that the pt remain on Eloquis, and start Brilinta without Aspirin.     Metastatic squamous cell carcinoma involving lymph node with unknown primary site [facility])   02/06/2018 Imaging    Impression CT abdomen and pelvis 1. Enlarged left groin lymph node which is of uncertain etiology. This would be amenable to percutaneous sampling if deemed clinically  appropriate. 2. Hepatic steatosis. Focal area of low-attenuation in the far lateral left hepatic lobe, not clearly seen on the prior exam. Consider liver ultrasound for further evaluation.  3. Mild L1 compression deformity which is new.    02/28/2018 Pathology Results    Left groin lymph node, needle core biopsy:       -Metastatic squamous cell carcinoma, see comment  P16 stain is strongly positive, suggestive of cervical or anogenital origin in this location. Immunohistochemical stains for CK5, p63, and pan cytokeratin are positive. ER is weakly positive. CK7 and CK20 are negative.    02/28/2018 Procedure    Technically successful fine-needle aspiration and core biopsy of an abnormal appearing left inguinal lymph node as described above.    03/15/2018 Pathology Results    1. Cervix, biopsy, 12 o'clock - BENIGN SQUAMOUS MUCOSA WITH INFLAMMATION. - NO DYSPLASIA OR MALIGNANCY. 2. Endocervix, curettage - BENIGN SQUAMOUS EPITHELIUM.    03/23/2018 PET scan    1. Isolated intensely hypermetabolic enlarged LEFT inguinal lymph node. No clear primary identified. 2. No abnormal activity associated with the vagina or anorectal tissue. 3. No hypermetabolic lymph nodes in the pelvis. 4. Two adjacent nodules in the RIGHT upper lobe with very low metabolic activity. These are not favored primary malignancies. Recommend follow-up per Fleischner criteria. Non-contrast chest CT at 6-12 months is recommended.      Pt denies c/o pain related to left inguinal lymph node. Pt does c/o bilateral knee pain, calf pain, and right sciatica pain, rated 6/10.    PREVIOUS RADIATION THERAPY: No  PAST MEDICAL HISTORY:  has a past medical history of A-fib (  [place]), Abnormal heart rhythm, Allergic rhinitis, Anxiety, Asthma, Diabetes mellitus without complication ([place]), GERD (gastroesophageal reflux disease), Headache, Hypertension, Neuromuscular disorder ([place]), Osteopenia, Rheumatoid arthritis ([place]), Rotator cuff tear  arthropathy, and Vertigo.    PAST SURGICAL HISTORY: Past Surgical History:  Procedure Laterality Date  . CATARACT EXTRACTION     bilateral  . EYE SURGERY    . REVERSE SHOULDER ARTHROPLASTY Right 08/04/2017  . REVERSE SHOULDER ARTHROPLASTY Right 08/04/2017   Procedure: RIGHT REVERSE SHOULDER ARTHROPLASTY;  Surgeon: Justice Britain, MD;  Location: [place];  Service: Orthopedics;  Laterality: Right;  . TUBAL LIGATION      FAMILY HISTORY: family history includes Emphysema in her father; Heart attack in her son; Heart disease in her mother.  SOCIAL HISTORY:  reports that she has never smoked. She has never used smokeless tobacco. She reports that she does not drink alcohol or use drugs.  ALLERGIES: Tylenol with codeine #3 [acetaminophen-codeine]; Ibuprofen; and Morphine  MEDICATIONS:  Current Outpatient Medications  Medication Sig Dispense Refill  . apixaban (ELIQUIS) 5 MG TABS tablet Take 5 mg by mouth 2 (two) times daily.    Marland Kitchen aspirin 81 MG tablet Take 81 mg by mouth every morning.     Marland Kitchen atorvastatin (LIPITOR) 20 MG tablet Take by mouth.    . benzonatate (TESSALON) 200 MG capsule Take 1 capsule (200 mg total) by mouth 3 (three) times daily as needed for cough. 30 capsule 1  . Cholecalciferol (VITAMIN D3) 50000 units CAPS Take 50,000 Units by mouth every Wednesday.    . digoxin (LANOXIN) 0.125 MG tablet Take 125 mcg by mouth every morning.     . diltiazem (CARDIZEM CD) 240 MG 24 hr capsule Take 240 mg by mouth daily.    . dorzolamide (TRUSOPT) 2 % ophthalmic solution Place 1 drop into both eyes 2 (two) times daily.    Marland Kitchen escitalopram (LEXAPRO) 20 MG tablet Take 20 mg by mouth daily.     . folic acid (FOLVITE) 1 MG tablet Take 1 mg by mouth every morning.     . furosemide (LASIX) 20 MG tablet Take 20 mg by mouth daily as needed for fluid.  11  . glucose blood test strip Accu-Chek Aviva Plus test strips  USE 1 STRIP TO CHECK GLUCOSE TWICE DAILY    . latanoprost (XALATAN) 0.005 % ophthalmic  solution Place 1 drop into both eyes at bedtime.    . nitroGLYCERIN (NITROSTAT) 0.4 MG SL tablet DISSOLVE ONE TABLET UNDER THE TONGUE EVERY 5 MINUTES AS NEEDED FOR CHEST PAIN. DO NOT EXCEED A TOTAL OF 3 DOSES IN 15 MINUTES    . oxyCODONE-acetaminophen (PERCOCET/ROXICET) 5-325 MG tablet oxycodone-acetaminophen 5 mg-325 mg tablet  TAKE 1 TABLET BY MOUTH EVERY 4 HOURS AS NEEDED (MAX 6 DAILY)    . pantoprazole (PROTONIX) 40 MG tablet Take 30- 60 min before your first and last meals of the day 60 tablet 2  . pentoxifylline (TRENTAL) 400 MG CR tablet Take 400 mg by mouth 3 (three) times daily with meals.    . rivastigmine (EXELON) 1.5 MG capsule Take 1 capsule (1.5 mg total) by mouth 2 (two) times daily. 60 capsule 11  . ticagrelor (BRILINTA) 90 MG TABS tablet Take by mouth.    . timolol (BETIMOL) 0.5 % ophthalmic solution Place 1 drop into both eyes daily.     . vitamin C (ASCORBIC ACID) 500 MG tablet Take 500 mg by mouth daily.    . Vitamin D, Ergocalciferol, (DRISDOL) 1.25 MG (50000 UT) CAPS  capsule Take by mouth.    . budesonide-formoterol (SYMBICORT) 160-4.5 MCG/ACT inhaler Inhale 2 puffs into the lungs 2 (two) times daily. (Patient not taking: Reported on 04/21/2018) 1 Inhaler 0   No current facility-administered medications for this encounter.     REVIEW OF SYSTEMS:  A 10+ POINT REVIEW OF SYSTEMS WAS OBTAINED including neurology, dermatology, psychiatry, cardiac, respiratory, lymph, extremities, GI, GU, musculoskeletal, constitutional, reproductive, HEENT. All pertinent positives are noted in the HPI. All others are negative.    PHYSICAL EXAM:  height is '5\' 3"'$  (1.6 m) and weight is 131 lb 8 oz (59.6 kg). Her oral temperature is 99.1 F (37.3 C). Her blood pressure is 159/39 (abnormal) and her pulse is 61. Her respiration is 18 and oxygen saturation is 100%.   General: Alert and oriented, in no acute distress, phone contact was made with patient's daughter throughout the evaluation HEENT: Head is  normocephalic. Extraocular movements are intact. Oropharynx is clear. Neck: Neck is supple, no palpable cervical or supraclavicular lymphadenopathy. Heart: Regular in rate and rhythm with no murmurs, rubs, or gallops. Chest: Clear to auscultation bilaterally, with no rhonchi, wheezes, or rales. Abdomen: Soft, nontender, nondistended, with no rigidity or guarding. Extremities: No cyanosis or edema. Lymphatics: see Neck Exam. Pt has a 2.5 x 3 cm palpable left inguinal lymph node. She has no appreciable swelling in her left lower extremity.  Skin: No concerning lesions. Musculoskeletal: symmetric strength and muscle tone throughout. Neurologic: Cranial nerves II through XII are grossly intact. No obvious focalities. Speech is fluent. Coordination is intact. Psychiatric: Judgment and insight are intact. Affect is appropriate.   ECOG = 1  0 - Asymptomatic (Fully active, able to carry on all predisease activities without restriction)  1 - Symptomatic but completely ambulatory (Restricted in physically strenuous activity but ambulatory and able to carry out work of a light or sedentary nature. For example, light housework, office work)  2 - Symptomatic, <50% in bed during the day (Ambulatory and capable of all self care but unable to carry out any work activities. Up and about more than 50% of waking hours)  3 - Symptomatic, >50% in bed, but not bedbound (Capable of only limited self-care, confined to bed or chair 50% or more of waking hours)  4 - Bedbound (Completely disabled. Cannot carry on any self-care. Totally confined to bed or chair)  5 - Death   Eustace Pen MM, Creech RH, Tormey DC, et al. [phone]). "Toxicity and response criteria of the [facility]". [place] Oncol. 5 (6): 649-55  LABORATORY DATA:  Lab Results  Component Value Date   WBC 9.4 07/28/2017   HGB 13.6 07/28/2017   HCT 43.6 07/28/2017   MCV 94.6 07/28/2017   PLT 256 07/28/2017   Lab Results   Component Value Date   NA 139 07/28/2017   K 4.8 07/28/2017   CL 107 07/28/2017   CO2 24 07/28/2017   GLUCOSE 179 (H) 07/28/2017   CREATININE 0.89 07/28/2017   CALCIUM 9.3 07/28/2017      RADIOGRAPHY: No results found.    IMPRESSION: Metastatic squamous cell carcinoma presenting in the left inguinal area (unknown primary site). The pt has undergone a thorough evaluation with no primary site identified. She does appear to be cleared for surgery and has seen Dr. Barry Dienes for consult. We will make sure Dr. Barry Dienes is aware of her cardiac intervention. If the patient proceeds with surgery, we may want to consider post-operative radiation therapy to the left inguinal  area depending on the final pathologic findings. If the patient is deemed not to be a surgical candidate, then we will proceed with definitive radiation therapy.   PLAN: Pt will be seeing Dr. Barry Dienes in general surgery soon for further evaluation.  I placed a call to her office this afternoon.   ------------------------------------------------  Blair Promise, PhD, MD      This document serves as a record of services personally performed by Gery Pray, MD. It was created on his behalf by Mary-Margaret Loma Messing, a trained medical scribe. The creation of this record is based on the scribe's personal observations and the provider's statements to them. This document has been checked and approved by the attending provider.

## 2018-05-29 NOTE — Patient Instructions (Signed)
Coronavirus (COVID-19) Are you at risk?  Are you at risk for the Coronavirus (COVID-19)?  To be considered HIGH RISK for Coronavirus (COVID-19), you have to meet the following criteria:  . Traveled to [place], [place], [place], [place] or [place]; or in the [place] to [place], [place], [place], or [place]; and have fever, cough, and shortness of breath within the last 2 weeks of travel OR . Been in close contact with a person diagnosed with COVID-19 within the last 2 weeks and have fever, cough, and shortness of breath . IF YOU DO NOT MEET THESE CRITERIA, YOU ARE CONSIDERED LOW RISK FOR COVID-19.  What to do if you are HIGH RISK for COVID-19?  . If you are having a medical emergency, call 911. . Seek medical care right away. Before you go to a doctor's office, urgent care or emergency department, call ahead and tell them about your recent travel, contact with someone diagnosed with COVID-19, and your symptoms. You should receive instructions from your physician's office regarding next steps of care.  . When you arrive at healthcare provider, tell the healthcare staff immediately you have returned from visiting [place], [place], [place], [place] or [place]; or traveled in the [place] to [place], [place], [place], or [place]; in the last two weeks or you have been in close contact with a person diagnosed with COVID-19 in the last 2 weeks.   . Tell the health care staff about your symptoms: fever, cough and shortness of breath. . After you have been seen by a medical provider, you will be either: o Tested for (COVID-19) and discharged home on quarantine except to seek medical care if symptoms worsen, and asked to  - Stay home and avoid contact with others until you get your results (4-5 days)  - Avoid travel on public transportation if possible (such as bus, train, or airplane) or o Sent to the Emergency Department by EMS for evaluation, COVID-19 testing, and possible  admission depending on your condition and test results.  What to do if you are LOW RISK for COVID-19?  Reduce your risk of any infection by using the same precautions used for avoiding the common cold or flu:  . Wash your hands often with soap and warm water for at least 20 seconds.  If soap and water are not readily available, use an alcohol-based hand sanitizer with at least 60% alcohol.  . If coughing or sneezing, cover your mouth and nose by coughing or sneezing into the elbow areas of your shirt or coat, into a tissue or into your sleeve (not your hands). . Avoid shaking hands with others and consider head nods or verbal greetings only. . Avoid touching your eyes, nose, or mouth with unwashed hands.  . Avoid close contact with people who are sick. . Avoid places or events with large numbers of people in one location, like concerts or sporting events. . Carefully consider travel plans you have or are making. . If you are planning any travel outside or inside the [place], visit the CDC's Travelers' Health webpage for the latest health notices. . If you have some symptoms but not all symptoms, continue to monitor at home and seek medical attention if your symptoms worsen. . If you are having a medical emergency, call 911.   ADDITIONAL HEALTHCARE OPTIONS FOR PATIENTS  [facility] / e-Visit: [email or web address]         [facility]: [phone]  [facility] [facility]: [phone]                   [facility]: [phone]   

## 2018-05-30 DIAGNOSIS — I2510 Atherosclerotic heart disease of native coronary artery without angina pectoris: Secondary | ICD-10-CM | POA: Diagnosis not present

## 2018-05-30 DIAGNOSIS — I10 Essential (primary) hypertension: Secondary | ICD-10-CM | POA: Diagnosis not present

## 2018-05-30 DIAGNOSIS — M7989 Other specified soft tissue disorders: Secondary | ICD-10-CM | POA: Diagnosis not present

## 2018-05-30 DIAGNOSIS — I480 Paroxysmal atrial fibrillation: Secondary | ICD-10-CM | POA: Diagnosis not present

## 2018-05-31 ENCOUNTER — Encounter: Payer: Self-pay | Admitting: Oncology

## 2018-05-31 ENCOUNTER — Ambulatory Visit
Admission: RE | Admit: 2018-05-31 | Discharge: 2018-05-31 | Disposition: A | Discharge status: Home / Self Care | Payer: Self-pay | Source: Ambulatory Visit | Attending: Radiation Oncology | Admitting: Radiation Oncology

## 2018-05-31 DIAGNOSIS — C779 Secondary and unspecified malignant neoplasm of lymph node, unspecified: Secondary | ICD-10-CM

## 2018-05-31 DIAGNOSIS — M7989 Other specified soft tissue disorders: Secondary | ICD-10-CM | POA: Diagnosis not present

## 2018-05-31 DIAGNOSIS — C801 Malignant (primary) neoplasm, unspecified: Secondary | ICD-10-CM

## 2018-06-02 ENCOUNTER — Other Ambulatory Visit: Payer: Self-pay | Admitting: Gynecologic Oncology

## 2018-06-02 DIAGNOSIS — C801 Malignant (primary) neoplasm, unspecified: Secondary | ICD-10-CM | POA: Diagnosis not present

## 2018-06-02 DIAGNOSIS — C774 Secondary and unspecified malignant neoplasm of inguinal and lower limb lymph nodes: Secondary | ICD-10-CM | POA: Diagnosis not present

## 2018-06-05 ENCOUNTER — Other Ambulatory Visit: Payer: Medicare HMO

## 2018-06-05 ENCOUNTER — Telehealth: Payer: Self-pay | Admitting: Internal Medicine

## 2018-06-05 ENCOUNTER — Telehealth: Payer: Self-pay

## 2018-06-05 ENCOUNTER — Encounter: Payer: Self-pay | Admitting: Oncology

## 2018-06-05 DIAGNOSIS — Z20822 Contact with and (suspected) exposure to covid-19: Secondary | ICD-10-CM

## 2018-06-05 DIAGNOSIS — R6889 Other general symptoms and signs: Secondary | ICD-10-CM | POA: Diagnosis not present

## 2018-06-05 DIAGNOSIS — Z789 Other specified health status: Secondary | ICD-10-CM

## 2018-06-05 NOTE — Telephone Encounter (Signed)
Patient called, left VM to return call to [phone] 7a-7p Monday-Friday to schedule testing per Dr. Melvyn Novas.

## 2018-06-05 NOTE — Addendum Note (Signed)
Addended byMatilde Sprang on: 06/05/2018 12:07 PM   Modules accepted: Orders

## 2018-06-05 NOTE — Telephone Encounter (Signed)
Ordered COVID test

## 2018-06-05 NOTE — Telephone Encounter (Signed)
Called and spoke with pt letting her know that MW said she needs to come into office for an appt but stated to her prior to [place] scheduling her for an appt, we needed her to have COVID test performed. Stated to pt that [place] testing center people should call her to get her scheduled to have test performed and then once we knew results of the test, as long as results were negative, then we would get her scheduled for appt with MW and pt verbalized understanding.  Routing to [facility] community testing pool so they can call pt to get her scheduled to have COVID test performed.

## 2018-06-05 NOTE — Telephone Encounter (Signed)
Called and spoke with pt who stated she began having increased SOB x2 weeks. Pt stated she also began coughing 2 days ago but stated that is due to allergies.  Pt stated she has symbicort and stated she has been taking that as prescribed.  Pt denies any complaints of fever. Pt would like recommendations to help with her symptoms. Dr. Melvyn Novas, please advise on this for pt. Thanks!

## 2018-06-05 NOTE — Progress Notes (Signed)
Gynecologic Oncology Multi-Disciplinary Disposition Conference Note  Date of the Conference: 06/05/2018  Patient Name: Lorraine Gilbert  Referring Provider: Dr. Moreen Fowler Primary GYN Oncologist: Dr. Denman George  Stage/Disposition:  Metastatic squamous cell carcinoma. Disposition is for repeat imaging with PET or CT scan followed by palliative radiation to the left groin lymph node. To be followed by repeat imaging.   This Multidisciplinary conference took place involving physicians from [place], [place], Radiation Oncology, Pathology, Radiology along with the Gynecologic Oncology Nurse Practitioner and RN.  Comprehensive assessment of the patient's malignancy, staging, need for surgery, chemotherapy, radiation therapy, and need for further testing were reviewed. Supportive measures, both inpatient and following discharge were also discussed. The recommended plan of care is documented. Greater than 35 minutes were spent correlating and coordinating this patient's care.

## 2018-06-05 NOTE — Telephone Encounter (Addendum)
Patient returned call, advised of the need for COVID testing per Dr. Melvyn Novas, she verbalized understanding and says if she can come this afternoon, because she doesn't drive and her ride is on the way after picking her husband up from work. Appointment scheduled for today at 1405 at [facility], advised to wear mask and address given.

## 2018-06-05 NOTE — Telephone Encounter (Signed)
Ov with all meds to see NP, nothing to offer over the phone - ideally needs covid testing first if drive by option available

## 2018-06-06 DIAGNOSIS — M0579 Rheumatoid arthritis with rheumatoid factor of multiple sites without organ or systems involvement: Secondary | ICD-10-CM | POA: Diagnosis not present

## 2018-06-06 DIAGNOSIS — M79605 Pain in left leg: Secondary | ICD-10-CM | POA: Diagnosis not present

## 2018-06-06 DIAGNOSIS — M2550 Pain in unspecified joint: Secondary | ICD-10-CM | POA: Diagnosis not present

## 2018-06-06 DIAGNOSIS — M159 Polyosteoarthritis, unspecified: Secondary | ICD-10-CM | POA: Diagnosis not present

## 2018-06-06 DIAGNOSIS — M79604 Pain in right leg: Secondary | ICD-10-CM | POA: Diagnosis not present

## 2018-06-06 DIAGNOSIS — Z79899 Other long term (current) drug therapy: Secondary | ICD-10-CM | POA: Diagnosis not present

## 2018-06-06 DIAGNOSIS — M353 Polymyalgia rheumatica: Secondary | ICD-10-CM | POA: Diagnosis not present

## 2018-06-06 DIAGNOSIS — M5431 Sciatica, right side: Secondary | ICD-10-CM | POA: Diagnosis not present

## 2018-06-08 ENCOUNTER — Telehealth: Payer: Self-pay | Admitting: *Deleted

## 2018-06-08 LAB — NOVEL CORONAVIRUS, NAA: SARS-CoV-2, NAA: NOT DETECTED

## 2018-06-08 NOTE — Telephone Encounter (Signed)
TC from patient. Reported negative Covid-19 results to her.

## 2018-06-13 DIAGNOSIS — M79601 Pain in right arm: Secondary | ICD-10-CM | POA: Diagnosis not present

## 2018-06-13 DIAGNOSIS — M542 Cervicalgia: Secondary | ICD-10-CM | POA: Diagnosis not present

## 2018-06-13 DIAGNOSIS — R208 Other disturbances of skin sensation: Secondary | ICD-10-CM | POA: Diagnosis not present

## 2018-06-13 DIAGNOSIS — Z96611 Presence of right artificial shoulder joint: Secondary | ICD-10-CM | POA: Diagnosis not present

## 2018-06-14 ENCOUNTER — Other Ambulatory Visit: Payer: Self-pay | Admitting: Radiation Oncology

## 2018-06-14 NOTE — Progress Notes (Signed)
Progress Note:  I discussed recommendations from the gynecologic oncology conference from last week with the patient's daughter today.  We discussed surgery was not recommended given the patient's significant risk for bleeding while she is on Brilinta..  We discussed that the recommendations from conference would be to proceed with radiation therapy to the left inguinal mass.  We also discussed consideration for repeating imaging studies but the patient's PET scan was ~ 2 months ago and this will likely not be approved in the short interval.  Patient has been scheduled for follow-up of her small pulmonary nodules with the CT scan in the future.  Daughter states she will discuss with her brothers and the patient will let us know if they wish to proceed with radiation therapy as part of the overall management.  -----------------------------------  Blair Promise, PhD, MD

## 2018-06-16 DIAGNOSIS — E1169 Type 2 diabetes mellitus with other specified complication: Secondary | ICD-10-CM | POA: Diagnosis not present

## 2018-06-16 DIAGNOSIS — I4891 Unspecified atrial fibrillation: Secondary | ICD-10-CM | POA: Diagnosis not present

## 2018-06-16 DIAGNOSIS — K219 Gastro-esophageal reflux disease without esophagitis: Secondary | ICD-10-CM | POA: Diagnosis not present

## 2018-06-16 DIAGNOSIS — C801 Malignant (primary) neoplasm, unspecified: Secondary | ICD-10-CM | POA: Diagnosis not present

## 2018-06-16 DIAGNOSIS — F419 Anxiety disorder, unspecified: Secondary | ICD-10-CM | POA: Diagnosis not present

## 2018-06-16 DIAGNOSIS — I2510 Atherosclerotic heart disease of native coronary artery without angina pectoris: Secondary | ICD-10-CM | POA: Diagnosis not present

## 2018-06-16 DIAGNOSIS — R29898 Other symptoms and signs involving the musculoskeletal system: Secondary | ICD-10-CM | POA: Diagnosis not present

## 2018-06-16 DIAGNOSIS — C779 Secondary and unspecified malignant neoplasm of lymph node, unspecified: Secondary | ICD-10-CM | POA: Diagnosis not present

## 2018-06-16 DIAGNOSIS — E7800 Pure hypercholesterolemia, unspecified: Secondary | ICD-10-CM | POA: Diagnosis not present

## 2018-06-19 DIAGNOSIS — M48061 Spinal stenosis, lumbar region without neurogenic claudication: Secondary | ICD-10-CM | POA: Diagnosis not present

## 2018-06-19 DIAGNOSIS — M25511 Pain in right shoulder: Secondary | ICD-10-CM | POA: Diagnosis not present

## 2018-06-19 DIAGNOSIS — Z96611 Presence of right artificial shoulder joint: Secondary | ICD-10-CM | POA: Diagnosis not present

## 2018-06-21 ENCOUNTER — Telehealth: Payer: Self-pay | Admitting: Internal Medicine

## 2018-06-21 DIAGNOSIS — M542 Cervicalgia: Secondary | ICD-10-CM | POA: Diagnosis not present

## 2018-06-21 DIAGNOSIS — R05 Cough: Secondary | ICD-10-CM

## 2018-06-21 DIAGNOSIS — R058 Other specified cough: Secondary | ICD-10-CM

## 2018-06-21 NOTE — Telephone Encounter (Signed)
Pt is calling back [phone]

## 2018-06-21 NOTE — Telephone Encounter (Signed)
Called and spoke with Patient. Patient stated she has been having increased SHOB for the last 2 months.  Patient denies any other symptoms at this time. Patient stated she had Covid 19 testing, and it was negative. Patient request in person office visit with MW.  Message routed to Dr Melvyn Novas- to ok for in person OV

## 2018-06-21 NOTE — Telephone Encounter (Signed)
ATC patient to go over covid symptoms, to make sure there are new acute symptoms.  Left message to call back.  Per MW- Fine with me unless new acute symptoms suggesting infection in which case should return to ER  Must bring all active meds to office or will need to reschedule

## 2018-06-21 NOTE — Telephone Encounter (Signed)
Fine with me unless new acute symptoms suggesting infection in which case should return to ER  Must bring all active meds to office or will need to reschedule

## 2018-06-21 NOTE — Telephone Encounter (Signed)
Just add on to end of day but again must bring all meds or will need to reschedule   Get cxr prior dx  Upper airway cough syndrome

## 2018-06-21 NOTE — Telephone Encounter (Signed)
MW tomorrow is your last day in clinic. Pt all Neg COVID sx Pt states she has many appts upcoming radiation/chemo. She really needs to be seen. Please advise.

## 2018-06-21 NOTE — Telephone Encounter (Signed)
Called and spoke to pt. Informed her of the recs per MW. Appt made with MW on 06/22/2018 at 4pm, pt aware to bring medications and come at 330p for CXR. COVID screen negative. Nothing further needed.

## 2018-06-22 ENCOUNTER — Other Ambulatory Visit: Payer: Self-pay

## 2018-06-22 ENCOUNTER — Ambulatory Visit (INDEPENDENT_AMBULATORY_CARE_PROVIDER_SITE_OTHER): Payer: Medicare HMO | Admitting: Internal Medicine

## 2018-06-22 ENCOUNTER — Ambulatory Visit (INDEPENDENT_AMBULATORY_CARE_PROVIDER_SITE_OTHER): Payer: Medicare HMO

## 2018-06-22 ENCOUNTER — Encounter: Payer: Self-pay | Admitting: Internal Medicine

## 2018-06-22 DIAGNOSIS — J45991 Cough variant asthma: Secondary | ICD-10-CM | POA: Diagnosis not present

## 2018-06-22 DIAGNOSIS — R058 Other specified cough: Secondary | ICD-10-CM

## 2018-06-22 DIAGNOSIS — R05 Cough: Secondary | ICD-10-CM

## 2018-06-22 DIAGNOSIS — R0602 Shortness of breath: Secondary | ICD-10-CM | POA: Diagnosis not present

## 2018-06-22 MED ORDER — MOMETASONE FURO-FORMOTEROL FUM 100-5 MCG/ACT IN AERO: 2.0000 | INHALATION_SPRAY | Freq: Two times a day (BID) | RESPIRATORY_TRACT | 11 refills | Status: DC

## 2018-06-22 MED ORDER — MOMETASONE FURO-FORMOTEROL FUM 100-5 MCG/ACT IN AERO: 2.0000 | INHALATION_SPRAY | Freq: Two times a day (BID) | RESPIRATORY_TRACT | 0 refills | Status: DC

## 2018-06-22 NOTE — Patient Instructions (Signed)
Resume dulera 100 Take 2 puffs first thing in am and then another 2 puffs about 12 hours later.   Work on inhaler technique:  relax and gently blow all the way out then take a nice smooth deep breath back in, triggering the inhaler at same time you start breathing in.  Hold for up to 5 seconds if you can. Blow out thru nose. Rinse and gargle with water when done     If not better or can't afford dulera 100  >>> return in 2 weeks

## 2018-06-22 NOTE — Progress Notes (Signed)
Subjective:   Patient ID: Lorraine Gilbert, female    DOB: 04-28-1932  MRN: [id number]        Brief patient profile:  32 yf from [place]  never smoker with cough and sob x decades previously eval by Dr Bernita Buffy referred by Dr Moreen Fowler 07/24/2012 for further evaluation of sob and cough with perfectly nl pfts 12/04/2013     History of Present Illness  07/24/2012 1st pulmonary eval cc cough x 2 months indolent onset progressively worse that is not better on advair, min prod of white mucus more day than night, assoc with mild sob. rec Stop advair Start dulera 100 Take 2 puffs first thing in am and then another 2 puffs about 12 hours later.  Pantoprazole (protonix) 40 mg  Take 30-60 min before first meal of the day and Pepcid 20 mg one bedtime until return to office - this is the best way to tell whether stomach acid is contributing to your problem.   GERD   If not better return here in 2 weeks all bottles/inhalers from all doctors or return to [place] and if you want to change to [facility] we will need you to bring your records with you as well >> did not return as rec      01/27/2018  f/u ov/Anurag Scarfo re: uacs improved on 1st gen H1 blockers per guidelines / finally brought meds as req   Chief Complaint  Patient presents with  . Follow-up    Her breathing is unchanged. She states still having hoarseness. No new co's.    Dyspnea:  MMRC2 = can't walk a nl pace on a flat grade s sob but does fine slow and flat eg shopping fine  Cough: in am only a cough or two  then ok  The rest of the day  Sleeping: flat ok  SABA use: none  rec Continue For drainage / throat tickle try take CHLORPHENIRAMINE  4 mg - take one every 4 hours as needed     06/22/2018 acute extended ov/Makailah Slavick re:  Flare of asthma off ics/laba Chief Complaint  Patient presents with  . Acute Visit    Increased SOB x 2 months- out of symbicort for about that time.   covid pcr neg 06/05/18  Doe x 100 ft =  MMRC3 = can't walk  100 yards even at a slow pace at a flat grade s stopping due to sob No cough  Sleeping on side/ 2 pillows/ bed iflat  No 02    Very confused with meds, poor correlation with those she brought vs what her med st indicates she takes    No obvious day to day or daytime variability or assoc excess/ purulent sputum or mucus plugs or hemoptysis or cp or chest tightness, subjective wheeze or overt sinus or hb symptoms.   Sleeping  without nocturnal  or early am exacerbation  of respiratory  c/o's or need for noct saba. Also denies any obvious fluctuation of symptoms with weather or environmental changes or other aggravating or alleviating factors except as outlined above   No unusual exposure hx or h/o childhood pna/ asthma or knowledge of premature birth.  Current Allergies, Complete Past Medical History, Past Surgical History, Family History, and Social History were reviewed in [employer] record.  ROS  The following are not active complaints unless bolded Hoarseness, sore throat, dysphagia, dental problems, itching, sneezing,  nasal congestion or discharge of excess mucus or purulent secretions, ear ache,  fever, chills, sweats, unintended wt loss or wt gain, classically pleuritic or exertional cp,  orthopnea pnd or arm/hand swelling  or leg swelling, presyncope, palpitations, abdominal pain, anorexia, nausea, vomiting, diarrhea  or change in bowel habits or change in bladder habits, change in stools or change in urine, dysuria, hematuria,  rash, arthralgias, visual complaints, headache, numbness, weakness or ataxia or problems with walking or coordination,  change in mood or  memory.        Current Meds - - NOTE:   Unable to verify as accurately reflecting what pt takes     Medication Sig  . apixaban (ELIQUIS) 5 MG TABS tablet Take 5 mg by mouth 2 (two) times daily.  Marland Kitchen aspirin 81 MG tablet Take 81 mg by mouth every morning.   Marland Kitchen atorvastatin (LIPITOR) 20 MG tablet Take by  mouth.  . benzonatate (TESSALON) 200 MG capsule Take 1 capsule (200 mg total) by mouth 3 (three) times daily as needed for cough.  . Cholecalciferol (VITAMIN D3) 50000 units CAPS Take 50,000 Units by mouth every Wednesday.  . digoxin (LANOXIN) 0.125 MG tablet Take 125 mcg by mouth every morning.   . dorzolamide (TRUSOPT) 2 % ophthalmic solution Place 1 drop into both eyes 2 (two) times daily.  Marland Kitchen escitalopram (LEXAPRO) 20 MG tablet Take 20 mg by mouth daily.   . folic acid (FOLVITE) 1 MG tablet Take 1 mg by mouth every morning.   Marland Kitchen glucose blood test strip Accu-Chek Aviva Plus test strips  USE 1 STRIP TO CHECK GLUCOSE TWICE DAILY  . nitroGLYCERIN (NITROSTAT) 0.4 MG SL tablet DISSOLVE ONE TABLET UNDER THE TONGUE EVERY 5 MINUTES AS NEEDED FOR CHEST PAIN. DO NOT EXCEED A TOTAL OF 3 DOSES IN 15 MINUTES  . oxyCODONE-acetaminophen (PERCOCET/ROXICET) 5-325 MG tablet oxycodone-acetaminophen 5 mg-325 mg tablet  TAKE 1 TABLET BY MOUTH EVERY 4 HOURS AS NEEDED (MAX 6 DAILY)  . pantoprazole (PROTONIX) 40 MG tablet Take 30- 60 min before your first and last meals of the day  . pentoxifylline (TRENTAL) 400 MG CR tablet Take 400 mg by mouth 3 (three) times daily with meals.  . rivastigmine (EXELON) 1.5 MG capsule Take 1 capsule (1.5 mg total) by mouth 2 (two) times daily.  . ticagrelor (BRILINTA) 90 MG TABS tablet Take by mouth.  . timolol (BETIMOL) 0.5 % ophthalmic solution Place 1 drop into both eyes daily.                       Objective:  Physical Exam    08/14/2014        138 >  04/24/2015   137> 08/08/2015  136 > 02/09/2016  132 >  11/15/2016    137 > 01/06/2017  133 > 01/05/2018   134 > 01/27/2018   133 > 06/22/2018    amb [place] female nad   Vital signs reviewed - Note on arrival 02 sats  99% on RA       HEENT: nl dentition, turbinates bilaterally, and oropharynx. Nl external ear canals without cough reflex   NECK :  without JVD/Nodes/TM/ nl carotid upstrokes bilaterally   LUNGS: no acc  muscle use,  Nl contour chest trace end exp wheeze  bilaterally without cough on insp or exp maneuvers   CV:  RRR  no s3 or murmur or increase in P2, and no edema   ABD:  soft and nontender with nl inspiratory excursion in the supine position. No bruits or organomegaly appreciated, bowel  sounds nl  MS:  Nl gait/ ext warm without deformities, calf tenderness, cyanosis or clubbing No obvious joint restrictions   SKIN: warm and dry without lesions    NEURO:  alert, approp, nl sensorium with  no motor or cerebellar deficits apparent.        CXR PA and Lateral:   06/22/2018 :    I personally reviewed images and agree with radiology impression as follows:    Cardiomegaly. No active disease    Assessment & Plan:

## 2018-06-25 ENCOUNTER — Encounter: Payer: Self-pay | Admitting: Internal Medicine

## 2018-06-25 NOTE — Assessment & Plan Note (Addendum)
-   10/18/2013  resume trial of dulera 100 2bid  - PFTs wnl 12/04/13 > ok to try off dulera - flare off dulera 08/14/14 > ok to resume dulera 100 2bid  - Spirometry 04/24/2015  wnl off dulera / even fef 25-75 - NO 04/24/2015  = 17  - 11/15/2016  try symbicort 80 2bid  - PFT's  01/06/2017  FEV1 1.59 (101 % ) ratio 86  p 4 % improvement from saba p symb 80 x 2 x 12h  prior to study with DLCO  67/68c % corrects to 100 % for alv volume   - 01/27/2018 cough resolved maint on just 1st gen H1 blockers per guidelines  And off inhalers   - 06/22/2018  After extensive coaching inhaler device,  effectiveness =    75%   From a baseline 50%  Not clear if any/all of her symptoms are related to stopping laba/ics but reasonable to give sample of dulera 100 to see whether improving and if not return for further w/u with all meds in hand using a trust but verify approach to confirm accurate Medication  Reconciliation The principal here is that until we are certain that the  patients are doing what we've asked, it makes no sense to ask them to do more.    Advised:  formulary restrictions will be an ongoing challenge for the forseable future and I would be happy to pick an alternative if the pt will first  provide me a list of them -  pt  will need to return here for training for any new device that is required eg dpi vs hfa vs respimat.    In the meantime we can always provide samples so that the patient never runs out of any needed respiratory medications.    I had an extended discussion with the patient reviewing all relevant studies completed to date and  lasting 15 to 20 minutes of a 25 minute acute office  visit    See device teaching which extended face to face time for this visit   Each maintenance medication was reviewed in detail including most importantly the difference between maintenance and prns and under what circumstances the prns are to be triggered using an action plan format that is not reflected in the  computer generated alphabetically organized AVS.     Please see AVS for specific instructions unique to this visit that I personally wrote and verbalized to the the pt in detail and then reviewed with pt  by my nurse highlighting any  changes in therapy recommended at today's visit to their plan of care.

## 2018-06-28 ENCOUNTER — Other Ambulatory Visit: Payer: Self-pay

## 2018-06-28 ENCOUNTER — Ambulatory Visit
Admission: RE | Admit: 2018-06-28 | Discharge: 2018-06-28 | Disposition: A | Discharge status: Home / Self Care | Payer: Medicare HMO | Source: Ambulatory Visit | Attending: Radiation Oncology | Admitting: Radiation Oncology

## 2018-06-28 DIAGNOSIS — C801 Malignant (primary) neoplasm, unspecified: Secondary | ICD-10-CM | POA: Diagnosis not present

## 2018-06-28 DIAGNOSIS — Z510 Encounter for antineoplastic radiation therapy: Secondary | ICD-10-CM | POA: Diagnosis not present

## 2018-06-28 DIAGNOSIS — C774 Secondary and unspecified malignant neoplasm of inguinal and lower limb lymph nodes: Secondary | ICD-10-CM | POA: Diagnosis not present

## 2018-06-28 DIAGNOSIS — C779 Secondary and unspecified malignant neoplasm of lymph node, unspecified: Secondary | ICD-10-CM

## 2018-06-28 NOTE — Progress Notes (Signed)
  Radiation Oncology         (336) [phone] ________________________________  Name: Lorraine Gilbert MRN: [id number]  Date: 06/28/2018  DOB: 10/06/1932  SIMULATION AND TREATMENT PLANNING NOTE    ICD-10-CM   1. Metastatic squamous cell carcinoma involving lymph node with unknown primary site [facility]) C77.9    C80.1     DIAGNOSIS:  Metastatic squamous cell carcinoma involving lymph node with unknown primary site [facility]).   Metastatic squamous cell carcinoma presenting in the left inguinal area (unknown primary site)   NARRATIVE:  The patient was brought to the [facility].  Identity was confirmed.  All relevant records and images related to the planned course of therapy were reviewed.  The patient freely provided informed written consent to proceed with treatment after reviewing the details related to the planned course of therapy. The consent form was witnessed and verified by the simulation staff.  Then, the patient was set-up in a stable reproducible  supine position for radiation therapy.  CT images were obtained.  Surface markings were placed.  The CT images were loaded into the planning software.  Then the target and avoidance structures were contoured.  Treatment planning then occurred.  The radiation prescription was entered and confirmed.  Then, I designed and supervised the construction of a total of 5 medically necessary complex treatment devices.  I have requested : 3D Simulation  I have requested a DVH of the following structures: GTV, PTV, bladder, rectum, left femoral head.  I have ordered:dose calc.  PLAN:  The patient will receive 30 Gy in 10 fractions directed at the left inguinal lymph node.  -----------------------------------  Blair Promise, PhD, MD  This document serves as a record of services personally performed by Gery Pray, MD. It was created on his behalf by Mary-Margaret Loma Messing, a trained medical scribe. The creation of this record is based on the  scribe's personal observations and the provider's statements to them. This document has been checked and approved by the attending provider.

## 2018-06-29 DIAGNOSIS — R05 Cough: Secondary | ICD-10-CM | POA: Diagnosis not present

## 2018-06-29 DIAGNOSIS — I4891 Unspecified atrial fibrillation: Secondary | ICD-10-CM | POA: Diagnosis not present

## 2018-06-29 DIAGNOSIS — C774 Secondary and unspecified malignant neoplasm of inguinal and lower limb lymph nodes: Secondary | ICD-10-CM | POA: Diagnosis not present

## 2018-06-29 DIAGNOSIS — C801 Malignant (primary) neoplasm, unspecified: Secondary | ICD-10-CM | POA: Diagnosis not present

## 2018-06-29 DIAGNOSIS — Z510 Encounter for antineoplastic radiation therapy: Secondary | ICD-10-CM | POA: Diagnosis not present

## 2018-06-29 DIAGNOSIS — I2510 Atherosclerotic heart disease of native coronary artery without angina pectoris: Secondary | ICD-10-CM | POA: Diagnosis not present

## 2018-06-29 DIAGNOSIS — R6889 Other general symptoms and signs: Secondary | ICD-10-CM | POA: Diagnosis not present

## 2018-06-30 DIAGNOSIS — R6889 Other general symptoms and signs: Secondary | ICD-10-CM | POA: Diagnosis not present

## 2018-07-03 DIAGNOSIS — I10 Essential (primary) hypertension: Secondary | ICD-10-CM | POA: Diagnosis not present

## 2018-07-03 DIAGNOSIS — R0989 Other specified symptoms and signs involving the circulatory and respiratory systems: Secondary | ICD-10-CM | POA: Diagnosis not present

## 2018-07-03 DIAGNOSIS — I2510 Atherosclerotic heart disease of native coronary artery without angina pectoris: Secondary | ICD-10-CM | POA: Diagnosis not present

## 2018-07-05 ENCOUNTER — Ambulatory Visit
Admission: RE | Admit: 2018-07-05 | Discharge: 2018-07-05 | Disposition: A | Discharge status: Home / Self Care | Payer: Medicare HMO | Source: Ambulatory Visit | Attending: Radiation Oncology | Admitting: Radiation Oncology

## 2018-07-05 ENCOUNTER — Other Ambulatory Visit: Payer: Self-pay

## 2018-07-05 DIAGNOSIS — C801 Malignant (primary) neoplasm, unspecified: Secondary | ICD-10-CM

## 2018-07-05 DIAGNOSIS — C774 Secondary and unspecified malignant neoplasm of inguinal and lower limb lymph nodes: Secondary | ICD-10-CM | POA: Diagnosis not present

## 2018-07-05 DIAGNOSIS — Z510 Encounter for antineoplastic radiation therapy: Secondary | ICD-10-CM | POA: Diagnosis not present

## 2018-07-05 DIAGNOSIS — C779 Secondary and unspecified malignant neoplasm of lymph node, unspecified: Secondary | ICD-10-CM

## 2018-07-05 NOTE — Progress Notes (Signed)
  Radiation Oncology         (336) [phone] ________________________________  Name: Lorraine Gilbert MRN: [id number]  Date: 07/05/2018  DOB: Jul 15, 1932  Simulation Verification Note    ICD-10-CM   1. Metastatic squamous cell carcinoma involving lymph node with unknown primary site [facility])  C77.9    C80.1     Status: outpatient  NARRATIVE: The patient was brought to the treatment unit and placed in the planned treatment position. The clinical setup was verified. Then port films were obtained and uploaded to the radiation oncology medical record software.  The treatment beams were carefully compared against the planned radiation fields. The position location and shape of the radiation fields was reviewed. They targeted volume of tissue appears to be appropriately covered by the radiation beams. Organs at risk appear to be excluded as planned.  Based on my personal review, I approved the simulation verification. The patient's treatment will proceed as planned.  -----------------------------------  Blair Promise, PhD, MD

## 2018-07-06 ENCOUNTER — Other Ambulatory Visit: Payer: Self-pay

## 2018-07-06 ENCOUNTER — Ambulatory Visit
Admission: RE | Admit: 2018-07-06 | Discharge: 2018-07-06 | Disposition: A | Discharge status: Home / Self Care | Payer: Medicare HMO | Source: Ambulatory Visit | Attending: Radiation Oncology | Admitting: Radiation Oncology

## 2018-07-06 DIAGNOSIS — Z510 Encounter for antineoplastic radiation therapy: Secondary | ICD-10-CM | POA: Diagnosis not present

## 2018-07-06 DIAGNOSIS — C801 Malignant (primary) neoplasm, unspecified: Secondary | ICD-10-CM | POA: Diagnosis not present

## 2018-07-06 DIAGNOSIS — C774 Secondary and unspecified malignant neoplasm of inguinal and lower limb lymph nodes: Secondary | ICD-10-CM | POA: Diagnosis not present

## 2018-07-07 ENCOUNTER — Other Ambulatory Visit: Payer: Self-pay

## 2018-07-07 ENCOUNTER — Ambulatory Visit
Admission: RE | Admit: 2018-07-07 | Discharge: 2018-07-07 | Disposition: A | Discharge status: Home / Self Care | Payer: Medicare HMO | Source: Ambulatory Visit | Attending: Radiation Oncology | Admitting: Radiation Oncology

## 2018-07-07 DIAGNOSIS — C774 Secondary and unspecified malignant neoplasm of inguinal and lower limb lymph nodes: Secondary | ICD-10-CM | POA: Diagnosis not present

## 2018-07-07 DIAGNOSIS — Z510 Encounter for antineoplastic radiation therapy: Secondary | ICD-10-CM | POA: Diagnosis not present

## 2018-07-07 DIAGNOSIS — C801 Malignant (primary) neoplasm, unspecified: Secondary | ICD-10-CM | POA: Diagnosis not present

## 2018-07-10 ENCOUNTER — Other Ambulatory Visit: Payer: Self-pay

## 2018-07-10 ENCOUNTER — Ambulatory Visit
Admission: RE | Admit: 2018-07-10 | Discharge: 2018-07-10 | Disposition: A | Discharge status: Home / Self Care | Payer: Medicare HMO | Source: Ambulatory Visit | Attending: Radiation Oncology | Admitting: Radiation Oncology

## 2018-07-10 DIAGNOSIS — C801 Malignant (primary) neoplasm, unspecified: Secondary | ICD-10-CM | POA: Diagnosis not present

## 2018-07-10 DIAGNOSIS — Z510 Encounter for antineoplastic radiation therapy: Secondary | ICD-10-CM | POA: Diagnosis not present

## 2018-07-10 DIAGNOSIS — G5601 Carpal tunnel syndrome, right upper limb: Secondary | ICD-10-CM | POA: Diagnosis not present

## 2018-07-10 DIAGNOSIS — C774 Secondary and unspecified malignant neoplasm of inguinal and lower limb lymph nodes: Secondary | ICD-10-CM | POA: Diagnosis not present

## 2018-07-11 ENCOUNTER — Other Ambulatory Visit: Payer: Self-pay

## 2018-07-11 ENCOUNTER — Ambulatory Visit
Admission: RE | Admit: 2018-07-11 | Discharge: 2018-07-11 | Disposition: A | Discharge status: Home / Self Care | Payer: Medicare HMO | Source: Ambulatory Visit | Attending: Radiation Oncology | Admitting: Radiation Oncology

## 2018-07-11 DIAGNOSIS — Z510 Encounter for antineoplastic radiation therapy: Secondary | ICD-10-CM | POA: Diagnosis not present

## 2018-07-11 DIAGNOSIS — C801 Malignant (primary) neoplasm, unspecified: Secondary | ICD-10-CM | POA: Diagnosis not present

## 2018-07-11 DIAGNOSIS — C774 Secondary and unspecified malignant neoplasm of inguinal and lower limb lymph nodes: Secondary | ICD-10-CM | POA: Diagnosis not present

## 2018-07-12 ENCOUNTER — Ambulatory Visit
Admission: RE | Admit: 2018-07-12 | Discharge: 2018-07-12 | Disposition: A | Discharge status: Home / Self Care | Payer: Medicare HMO | Source: Ambulatory Visit | Attending: Radiation Oncology | Admitting: Radiation Oncology

## 2018-07-12 ENCOUNTER — Encounter: Payer: Self-pay | Admitting: Internal Medicine

## 2018-07-12 ENCOUNTER — Other Ambulatory Visit: Payer: Self-pay

## 2018-07-12 ENCOUNTER — Ambulatory Visit (INDEPENDENT_AMBULATORY_CARE_PROVIDER_SITE_OTHER): Payer: Medicare HMO | Admitting: Internal Medicine

## 2018-07-12 DIAGNOSIS — J45991 Cough variant asthma: Secondary | ICD-10-CM

## 2018-07-12 DIAGNOSIS — C774 Secondary and unspecified malignant neoplasm of inguinal and lower limb lymph nodes: Secondary | ICD-10-CM | POA: Diagnosis not present

## 2018-07-12 DIAGNOSIS — Z510 Encounter for antineoplastic radiation therapy: Secondary | ICD-10-CM | POA: Diagnosis not present

## 2018-07-12 DIAGNOSIS — C801 Malignant (primary) neoplasm, unspecified: Secondary | ICD-10-CM | POA: Diagnosis not present

## 2018-07-12 MED ORDER — BREO ELLIPTA 100-25 MCG/INH IN AEPB: 1.0000 | INHALATION_SPRAY | Freq: Every day | RESPIRATORY_TRACT | 0 refills | Status: DC

## 2018-07-12 MED ORDER — BREO ELLIPTA 100-25 MCG/INH IN AEPB: 1.0000 | INHALATION_SPRAY | Freq: Every day | RESPIRATORY_TRACT | 11 refills | Status: DC

## 2018-07-12 NOTE — Progress Notes (Addendum)
Subjective:   Patient ID: Lorraine Gilbert, female    DOB: [birth year]  MRN: [id number]        Brief patient profile:  7 yf from [place]  never smoker with cough and sob x decades previously eval by Dr Bernita Buffy referred by Dr Moreen Fowler 07/24/2012 for further evaluation of sob and cough with perfectly nl pfts 12/04/2013     History of Present Illness  07/24/2012 1st pulmonary eval cc cough x 2 months indolent onset progressively worse that is not better on advair, min prod of white mucus more day than night, assoc with mild sob. rec Stop advair Start dulera 100 Take 2 puffs first thing in am and then another 2 puffs about 12 hours later.  Pantoprazole (protonix) 40 mg  Take 30-60 min before first meal of the day and Pepcid 20 mg one bedtime until return to office - this is the best way to tell whether stomach acid is contributing to your problem.   GERD   If not better return here in 2 weeks all bottles/inhalers from all doctors or return to [place] and if you want to change to [facility] we will need you to bring your records with you as well >> did not return as rec      01/27/2018  f/u ov/Story Vanvranken re: uacs improved on 1st gen H1 blockers per guidelines / finally brought meds as req   Chief Complaint  Patient presents with  . Follow-up    Her breathing is unchanged. She states still having hoarseness. No new co's.    Dyspnea:  MMRC2 = can't walk a nl pace on a flat grade s sob but does fine slow and flat eg shopping fine  Cough: in am only a cough or two  then ok  The rest of the day  Sleeping: flat ok  SABA use: none  rec Continue For drainage / throat tickle try take CHLORPHENIRAMINE  4 mg - take one every 4 hours as needed     06/22/2018 acute extended ov/Chistopher Mangino re:  Flare of asthma off ics/laba Chief Complaint  Patient presents with  . Acute Visit    Increased SOB x 2 months- out of symbicort for about that time.   covid pcr neg 06/05/18  Doe x 100 ft =  MMRC3 = can't walk  100 yards even at a slow pace at a flat grade s stopping due to sob No cough  Sleeping on side/ 2 pillows/ bed iflat  No 02    Very confused with meds, poor correlation with those she brought vs what her med st indicates she takes  rec Resume dulera 100 Take 2 puffs first thing in am and then another 2 puffs about 12 hours later.  Work on inhaler technique:  relax and gently blow all the way out then take a nice smooth deep breath back in, triggering the inhaler at same time you start breathing in.  Hold for up to 5 seconds if you can. Blow out thru nose. Rinse and gargle with water when done If not better or can't afford dulera 100  >>> return in 2 weeks    07/12/2018  f/u ov/Laymond Postle re: asthma/ not aderent due to cost/ brougt meds but not med calendar Chief Complaint  Patient presents with  . Follow-up    Breathing has improved some. She coughs some at night, occ will wake her up.   Dyspnea:  Does fine as long as access to dulera / much  worse when runs out, has 36 doses left on one we gave her last ov Cough: no excess mucus Sleeping: better / sleeps flat with 2 pillows - only wakes up if doesn't take pm dulera SABA use: min  02: none   No obvious day to day or daytime variability or assoc excess/ purulent sputum or mucus plugs or hemoptysis or cp or chest tightness, subjective wheeze or overt sinus or hb symptoms.    Also denies any obvious fluctuation of symptoms with weather or environmental changes or other aggravating or alleviating factors except as outlined above   No unusual exposure hx or h/o childhood pna/ asthma or knowledge of premature birth.  Current Allergies, Complete Past Medical History, Past Surgical History, Family History, and Social History were reviewed in [employer] record.  ROS  The following are not active complaints unless bolded Hoarseness, sore throat, dysphagia, dental problems, itching, sneezing,  nasal congestion or discharge of  excess mucus or purulent secretions, ear ache,   fever, chills, sweats, unintended wt loss or wt gain, classically pleuritic or exertional cp,  orthopnea pnd or arm/hand swelling  or leg swelling, presyncope, palpitations, abdominal pain, anorexia, nausea, vomiting, diarrhea  or change in bowel habits or change in bladder habits, change in stools or change in urine, dysuria, hematuria,  rash, arthralgias, visual complaints, headache, numbness, weakness or ataxia or problems with walking or coordination,  change in mood or  memory.        Current Meds  Medication Sig  . apixaban (ELIQUIS) 5 MG TABS tablet Take 5 mg by mouth 2 (two) times daily.  Marland Kitchen atorvastatin (LIPITOR) 20 MG tablet Take by mouth.  . benzonatate (TESSALON) 200 MG capsule Take 1 capsule (200 mg total) by mouth 3 (three) times daily as needed for cough.  . budesonide-formoterol (SYMBICORT) 160-4.5 MCG/ACT inhaler Inhale 2 puffs into the lungs 2 (two) times daily.  . Cholecalciferol (VITAMIN D3) 50000 units CAPS Take 50,000 Units by mouth every Wednesday.  . digoxin (LANOXIN) 0.125 MG tablet Take 125 mcg by mouth every morning.   . diltiazem (CARDIZEM CD) 180 MG 24 hr capsule Take 180 mg by mouth daily.  Marland Kitchen escitalopram (LEXAPRO) 20 MG tablet Take 20 mg by mouth daily.   . folic acid (FOLVITE) 1 MG tablet Take 1 mg by mouth every morning.   . mometasone-formoterol (DULERA) 100-5 MCG/ACT AERO Inhale 2 puffs into the lungs 2 (two) times a day.  . nitroGLYCERIN (NITROSTAT) 0.4 MG SL tablet DISSOLVE ONE TABLET UNDER THE TONGUE EVERY 5 MINUTES AS NEEDED FOR CHEST PAIN. DO NOT EXCEED A TOTAL OF 3 DOSES IN 15 MINUTES  . pantoprazole (PROTONIX) 40 MG tablet Take 30- 60 min before your first and last meals of the day  . ticagrelor (BRILINTA) 90 MG TABS tablet Take by mouth.  . vitamin C (ASCORBIC ACID) 500 MG tablet Take 500 mg by mouth daily.  . Vitamin D, Ergocalciferol, (DRISDOL) 1.25 MG (50000 UT) CAPS capsule Take by mouth.                       Objective:  Physical Exam    08/14/2014        138 >  04/24/2015   137> 08/08/2015  136 > 02/09/2016  132 >  11/15/2016    137 > 01/06/2017  133 > 01/05/2018   134 > 01/27/2018   133 >  07/12/2018  129  amb [place] female   Vital  signs reviewed - Note on arrival 02 sats  99% on RA      trace end exp wheeze  bilaterally without cough on insp or exp maneuvers  HEENT: nl dentition, turbinates bilaterally, and oropharynx. Nl external ear canals without cough reflex   NECK :  without JVD/Nodes/TM/ nl carotid upstrokes bilaterally   LUNGS: no acc muscle use,  Nl contour chest with trace end exp wheeze/ bilaterally with variable cough  exp maneuvers   CV:  RRR  no s3 or murmur or increase in P2, and no edema   ABD:  soft and nontender with nl inspiratory excursion in the supine position. No bruits or organomegaly appreciated, bowel sounds nl  MS:  Nl gait/ ext warm without deformities, calf tenderness, cyanosis or clubbing No obvious joint restrictions   SKIN: warm and dry without lesions    NEURO:  alert, approp, nl sensorium with  no motor or cerebellar deficits apparent.        Assessment & Plan:

## 2018-07-12 NOTE — Patient Instructions (Addendum)
Breo 100 take one puff each am    Please schedule a follow up office visit in 4 weeks, sooner if needed  with all medications /inhalers/ solutions in hand so we can verify exactly what you are taking. This includes all medications from all doctors and over the counters

## 2018-07-13 ENCOUNTER — Ambulatory Visit
Admission: RE | Admit: 2018-07-13 | Discharge: 2018-07-13 | Disposition: A | Discharge status: Home / Self Care | Payer: Medicare HMO | Source: Ambulatory Visit | Attending: Radiation Oncology | Admitting: Radiation Oncology

## 2018-07-13 ENCOUNTER — Other Ambulatory Visit: Payer: Self-pay

## 2018-07-13 ENCOUNTER — Encounter: Payer: Self-pay | Admitting: Internal Medicine

## 2018-07-13 DIAGNOSIS — C774 Secondary and unspecified malignant neoplasm of inguinal and lower limb lymph nodes: Secondary | ICD-10-CM | POA: Diagnosis not present

## 2018-07-13 DIAGNOSIS — C801 Malignant (primary) neoplasm, unspecified: Secondary | ICD-10-CM | POA: Diagnosis not present

## 2018-07-13 DIAGNOSIS — Z510 Encounter for antineoplastic radiation therapy: Secondary | ICD-10-CM | POA: Diagnosis not present

## 2018-07-13 NOTE — Assessment & Plan Note (Signed)
Onset decades prior to initial pulmonary eval 2014  - 10/18/2013  resume trial of dulera 100 2bid  -Med calendar 11/01/2013 > did not bring as instructed 12/04/13 - PFTs wnl 12/04/13 > ok to try off dulera - flare off dulera 08/14/14 > ok to resume dulera 100 2bid  - Spirometry 04/24/2015  wnl off dulera / even fef 25-75 - NO 04/24/2015  = 17  - 11/15/2016  try symbicort 80 2bid  - PFT's  01/06/2017  FEV1 1.59 (101 % ) ratio 86  p 4 % improvement from saba p symb 80 x 2 x 12h  prior to study with DLCO  67/68c % corrects to 100 % for alv volume   - 01/27/2018 cough resolved maint on just 1st gen H1 blockers per guidelines  And off inhalers     - 07/12/2018  After extensive coaching inhaler device,  effectiveness =    90% elipta > try BREO 100 one am for insurance purposes    Advised:  formulary restrictions will be an ongoing challenge for the forseable future and I would be happy to pick an alternative if the pt will first  provide me a list of them -  pt  will need to return here for training for any new device that is required eg dpi vs hfa vs respimat.    In the meantime we can always provide samples so that the patient never runs out of any needed respiratory medications.   Concerned her cough may flare on BREO 100 due to upper airway irritability and emphasized to her that she should rinse and gargle after use.  I had an extended discussion with the patient reviewing all relevant studies completed to date and  lasting 15 to 20 minutes of a 25 minute visit    See device teaching which extended face to face time for this visit.  Each maintenance medication was reviewed in detail including emphasizing most importantly the difference between maintenance and prns and under what circumstances the prns are to be triggered using an action plan format that is not reflected in the computer generated alphabetically organized AVS which I have not found useful in most complex patients, especially with  respiratory illnesses  Please see AVS for specific instructions unique to this visit that I personally wrote and verbalized to the the pt in detail and then reviewed with pt  by my nurse highlighting any  changes in therapy recommended at today's visit to their plan of care.

## 2018-07-14 ENCOUNTER — Other Ambulatory Visit: Payer: Self-pay

## 2018-07-14 ENCOUNTER — Ambulatory Visit
Admission: RE | Admit: 2018-07-14 | Discharge: 2018-07-14 | Disposition: A | Discharge status: Home / Self Care | Payer: Medicare HMO | Source: Ambulatory Visit | Attending: Radiation Oncology | Admitting: Radiation Oncology

## 2018-07-14 DIAGNOSIS — C801 Malignant (primary) neoplasm, unspecified: Secondary | ICD-10-CM | POA: Diagnosis not present

## 2018-07-14 DIAGNOSIS — C774 Secondary and unspecified malignant neoplasm of inguinal and lower limb lymph nodes: Secondary | ICD-10-CM | POA: Diagnosis not present

## 2018-07-14 DIAGNOSIS — Z510 Encounter for antineoplastic radiation therapy: Secondary | ICD-10-CM | POA: Diagnosis not present

## 2018-07-17 ENCOUNTER — Ambulatory Visit
Admission: RE | Admit: 2018-07-17 | Discharge: 2018-07-17 | Disposition: A | Discharge status: Home / Self Care | Payer: Medicare HMO | Source: Ambulatory Visit | Attending: Radiation Oncology | Admitting: Radiation Oncology

## 2018-07-17 ENCOUNTER — Other Ambulatory Visit: Payer: Self-pay

## 2018-07-17 DIAGNOSIS — Z510 Encounter for antineoplastic radiation therapy: Secondary | ICD-10-CM | POA: Diagnosis not present

## 2018-07-17 DIAGNOSIS — C774 Secondary and unspecified malignant neoplasm of inguinal and lower limb lymph nodes: Secondary | ICD-10-CM | POA: Diagnosis not present

## 2018-07-17 DIAGNOSIS — C801 Malignant (primary) neoplasm, unspecified: Secondary | ICD-10-CM | POA: Diagnosis not present

## 2018-07-18 ENCOUNTER — Ambulatory Visit
Admission: RE | Admit: 2018-07-18 | Discharge: 2018-07-18 | Disposition: A | Discharge status: Home / Self Care | Payer: Medicare HMO | Source: Ambulatory Visit | Attending: Radiation Oncology | Admitting: Radiation Oncology

## 2018-07-18 DIAGNOSIS — C774 Secondary and unspecified malignant neoplasm of inguinal and lower limb lymph nodes: Secondary | ICD-10-CM | POA: Diagnosis not present

## 2018-07-18 DIAGNOSIS — C801 Malignant (primary) neoplasm, unspecified: Secondary | ICD-10-CM | POA: Diagnosis not present

## 2018-07-18 DIAGNOSIS — Z510 Encounter for antineoplastic radiation therapy: Secondary | ICD-10-CM | POA: Diagnosis not present

## 2018-07-19 DIAGNOSIS — R0989 Other specified symptoms and signs involving the circulatory and respiratory systems: Secondary | ICD-10-CM | POA: Diagnosis not present

## 2018-07-20 ENCOUNTER — Encounter: Payer: Self-pay | Admitting: Radiation Oncology

## 2018-07-20 NOTE — Progress Notes (Signed)
  Radiation Oncology         (336) [phone] ________________________________  Name: Lorraine Gilbert MRN: [id number]  Date: 07/20/2018  DOB: 03-15-1932  End of Treatment Note  Diagnosis:   Metastatic squamous cell carcinoma presenting in the left inguinal area (unknown primary site).      Indication for treatment:  Curative       Radiation treatment dates:   07/05/2018 - 07/18/2018  Site/dose:   Left Inguinal / 30 Gy in 10 fractions  Beams/energy:   3D, photons / 6X, 10X  Narrative: The patient tolerated radiation treatment relatively well. She reported mild fatigue and denied any pain, diarrhea, and skin concerns. She was noted to have a decrease in size of the inguinal nodes by the end of treatment.  Plan: The patient has completed radiation treatment. The patient will return to radiation oncology clinic for routine followup in one month. I advised them to call or return sooner if they have any questions or concerns related to their recovery or treatment.  -----------------------------------  Blair Promise, PhD, MD  This document serves as a record of services personally performed by Gery Pray, MD. It was created on his behalf by Wilburn Mylar, a trained medical scribe. The creation of this record is based on the scribe's personal observations and the provider's statements to them. This document has been checked and approved by the attending provider.

## 2018-07-25 DIAGNOSIS — H401131 Primary open-angle glaucoma, bilateral, mild stage: Secondary | ICD-10-CM | POA: Diagnosis not present

## 2018-07-25 DIAGNOSIS — H04123 Dry eye syndrome of bilateral lacrimal glands: Secondary | ICD-10-CM | POA: Diagnosis not present

## 2018-07-25 DIAGNOSIS — Z961 Presence of intraocular lens: Secondary | ICD-10-CM | POA: Diagnosis not present

## 2018-07-25 DIAGNOSIS — H10413 Chronic giant papillary conjunctivitis, bilateral: Secondary | ICD-10-CM | POA: Diagnosis not present

## 2018-07-27 ENCOUNTER — Other Ambulatory Visit: Payer: Self-pay | Admitting: Family Medicine

## 2018-07-27 DIAGNOSIS — Z1231 Encounter for screening mammogram for malignant neoplasm of breast: Secondary | ICD-10-CM

## 2018-07-28 DIAGNOSIS — I2510 Atherosclerotic heart disease of native coronary artery without angina pectoris: Secondary | ICD-10-CM | POA: Diagnosis not present

## 2018-07-28 DIAGNOSIS — R0989 Other specified symptoms and signs involving the circulatory and respiratory systems: Secondary | ICD-10-CM | POA: Diagnosis not present

## 2018-08-11 ENCOUNTER — Ambulatory Visit: Payer: Medicare HMO | Admitting: Internal Medicine

## 2018-08-11 ENCOUNTER — Encounter: Payer: Self-pay | Admitting: Internal Medicine

## 2018-08-11 ENCOUNTER — Other Ambulatory Visit: Payer: Self-pay

## 2018-08-11 DIAGNOSIS — J45991 Cough variant asthma: Secondary | ICD-10-CM | POA: Diagnosis not present

## 2018-08-11 MED ORDER — BREO ELLIPTA 100-25 MCG/INH IN AEPB: 1.0000 | INHALATION_SPRAY | Freq: Every day | RESPIRATORY_TRACT | 0 refills | Status: DC

## 2018-08-11 NOTE — Patient Instructions (Signed)
Continue BREO one click each am   If not able to afford BREO, you will need to return to see one of our NP 's with your drug formulary in hand to pick a cheaper alternative.

## 2018-08-11 NOTE — Progress Notes (Signed)
Subjective:   Patient ID: Lorraine Gilbert, female    DOB: August 22, 1932  MRN: [id number]        Brief patient profile:  64 yf from [place]  never smoker with cough and sob x decades previously eval by Dr Bernita Buffy referred by Dr Moreen Fowler 07/24/2012 for further evaluation of sob and cough with perfectly nl pfts 12/04/2013     History of Present Illness  07/24/2012 1st pulmonary eval cc cough x 2 months indolent onset progressively worse that is not better on advair, min prod of white mucus more day than night, assoc with mild sob. rec Stop advair Start dulera 100 Take 2 puffs first thing in am and then another 2 puffs about 12 hours later.  Pantoprazole (protonix) 40 mg  Take 30-60 min before first meal of the day and Pepcid 20 mg one bedtime until return to office - this is the best way to tell whether stomach acid is contributing to your problem.   GERD   If not better return here in 2 weeks all bottles/inhalers from all doctors or return to [facility] and if you want to change to [facility] we will need you to bring your records with you as well >> did not return as rec      01/27/2018  f/u ov/Lorraine Gilbert re: uacs improved on 1st gen H1 blockers per guidelines / finally brought meds as req   Chief Complaint  Patient presents with  . Follow-up    Her breathing is unchanged. She states still having hoarseness. No new co's.    Dyspnea:  MMRC2 = can't walk a nl pace on a flat grade s sob but does fine slow and flat eg shopping fine  Cough: in am only a cough or two  then ok  The rest of the day  Sleeping: flat ok  SABA use: none  rec Continue For drainage / throat tickle try take CHLORPHENIRAMINE  4 mg - take one every 4 hours as needed     06/22/2018 acute extended ov/Lorraine Gilbert re:  Flare of asthma off ics/laba Chief Complaint  Patient presents with  . Acute Visit    Increased SOB x 2 months- out of symbicort for about that time.   covid pcr neg 06/05/18  Doe x 100 ft =  MMRC3 = can't walk  100 yards even at a slow pace at a flat grade s stopping due to sob No cough  Sleeping on side/ 2 pillows/ bed iflat  No 02    Very confused with meds, poor correlation with those she brought vs what her med st indicates she takes  rec Resume dulera 100 Take 2 puffs first thing in am and then another 2 puffs about 12 hours later.  Work on inhaler technique:  relax and gently blow all the way out then take a nice smooth deep breath back in, triggering the inhaler at same time you start breathing in.  Hold for up to 5 seconds if you can. Blow out thru nose. Rinse and gargle with water when done If not better or can't afford dulera 100  >>> return in 2 weeks    07/12/2018  f/u ov/Lorraine Gilbert re: asthma/ not aderent due to cost of care / brought meds but not med calendar Chief Complaint  Patient presents with  . Follow-up    Breathing has improved some. She coughs some at night, occ will wake her up.   Dyspnea:  Does fine as long as access to  dulera / much worse when runs out, has 36 doses left on one we gave her last ov Cough: no excess mucus Sleeping: better / sleeps flat with 2 pillows - only wakes up if doesn't take pm dulera SABA use: min  02: none rec Breo 100 take one puff each am  Please schedule a follow up office visit in 4 weeks   08/11/2018  f/u ov/Lorraine Gilbert re: asthma/ non adeherent  ? Number on sample- the one med she  did not bring as req  Chief Complaint  Patient presents with  . Follow-up    Breathing is doing well and no new co's.   Dyspnea:  MMRC1 = can walk nl pace, flat grade, can't hurry or go uphills or steps s sob  Cough: some noct but mostly sporadic, daytime, dry   Sleeping: fine once asleep bed is flat SABA use: none 02: none    No obvious day to day or daytime variability or assoc excess/ purulent sputum or mucus plugs or hemoptysis or cp or chest tightness, subjective wheeze or overt sinus or hb symptoms.   Sleeping as above  without nocturnal  or early am  exacerbation  of respiratory  c/o's or need for noct saba. Also denies any obvious fluctuation of symptoms with weather or environmental changes or other aggravating or alleviating factors except as outlined above   No unusual exposure hx or h/o childhood pna/ asthma or knowledge of premature birth.  Current Allergies, Complete Past Medical History, Past Surgical History, Family History, and Social History were reviewed in [employer] record.  ROS  The following are not active complaints unless bolded Hoarseness, sore throat, dysphagia, dental problems, itching, sneezing,  nasal congestion or discharge of excess mucus or purulent secretions, ear ache,   fever, chills, sweats, unintended wt loss or wt gain, classically pleuritic or exertional cp,  orthopnea pnd or arm/hand swelling  or leg swelling, presyncope, palpitations, abdominal pain, anorexia, nausea, vomiting, diarrhea  or change in bowel habits or change in bladder habits, change in stools or change in urine, dysuria, hematuria,  rash, arthralgias, visual complaints, headache, numbness, weakness or ataxia or problems with walking or coordination,  change in mood or  memory.        Current Meds  Medication Sig  . apixaban (ELIQUIS) 5 MG TABS tablet Take 5 mg by mouth 2 (two) times daily.  . Cholecalciferol (VITAMIN D3) 50000 units CAPS Take 50,000 Units by mouth every Wednesday.  . digoxin (LANOXIN) 0.125 MG tablet Take 125 mcg by mouth every morning.   . diltiazem (CARDIZEM CD) 180 MG 24 hr capsule Take 180 mg by mouth daily.  Marland Kitchen escitalopram (LEXAPRO) 10 MG tablet Take 10 mg by mouth daily.  . fluticasone furoate-vilanterol (BREO ELLIPTA) 100-25 MCG/INH AEPB Inhale 1 puff into the lungs daily.  . folic acid (FOLVITE) 1 MG tablet Take 1 mg by mouth every morning.   . nitroGLYCERIN (NITROSTAT) 0.4 MG SL tablet DISSOLVE ONE TABLET UNDER THE TONGUE EVERY 5 MINUTES AS NEEDED FOR CHEST PAIN. DO NOT EXCEED A TOTAL OF 3  DOSES IN 15 MINUTES  . pantoprazole (PROTONIX) 40 MG tablet Take 30- 60 min before your first and last meals of the day  . ticagrelor (BRILINTA) 90 MG TABS tablet Take by mouth.  . vitamin C (ASCORBIC ACID) 500 MG tablet Take 500 mg by mouth daily.                Objective:  Physical  Exam    08/14/2014        138 >  04/24/2015   137> 08/08/2015  136 > 02/09/2016  132 >  11/15/2016    137 > 01/06/2017  133 > 01/05/2018   134 > 01/27/2018   133 >  07/12/2018  129 > 08/11/2018   amb [place] female, heavy  accent hard to understand   Vital signs reviewed - Note on arrival 02 sats  99% on RA    HEENT: nl dentition, turbinates bilaterally, and oropharynx. Nl external ear canals without cough reflex   NECK :  without JVD/Nodes/TM/ nl carotid upstrokes bilaterally   LUNGS: no acc muscle use,  Nl contour chest which is clear to A and P bilaterally without cough on insp or exp maneuvers   CV:  RRR  no s3 or murmur or increase in P2, and no edema   ABD:  soft and nontender with nl inspiratory excursion in the supine position. No bruits or organomegaly appreciated, bowel sounds nl  MS:  Nl gait/ ext warm without deformities, calf tenderness, cyanosis or clubbing No obvious joint restrictions   SKIN: warm and dry without lesions    NEURO:  alert, approp, nl sensorium with  no motor or cerebellar deficits apparent.         Assessment & Plan:

## 2018-08-12 ENCOUNTER — Encounter: Payer: Self-pay | Admitting: Internal Medicine

## 2018-08-12 NOTE — Assessment & Plan Note (Signed)
Onset decades prior to initial pulmonary eval 2014  - 10/18/2013  resume trial of dulera 100 2bid  -Med calendar 11/01/2013 > did not bring as instructed 12/04/13 - PFTs wnl 12/04/13 > ok to try off dulera - flare off dulera 08/14/14 > ok to resume dulera 100 2bid  - Spirometry 04/24/2015  wnl off dulera / even fef 25-75 - NO 04/24/2015  = 17  - 11/15/2016  try symbicort 80 2bid  - PFT's  01/06/2017  FEV1 1.59 (101 % ) ratio 86  p 4 % improvement from saba p symb 80 x 2 x 12h  prior to study with DLCO  67/68c % corrects to 100 % for alv volume   - 01/27/2018 cough resolved maint on just 1st gen H1 blockers per guidelines  And off inhalers  - 06/22/2018 flared off dulera > resume   - 07/12/2018   try BREO 100 one am for insurance purposes  - 08/11/2018  After extensive coaching inhaler device,  effectiveness =    50% with elipta (fish mouth orientation of device)   Apparently despite difficulty using the easiest of all inhalers>>  All goals of chronic asthma control met including optimal function and elimination of symptoms with minimal need for rescue therapy.  Contingencies discussed in full including contacting this office immediately if not controlling the symptoms using the rule of two's.     Advised:  formulary restrictions will be an ongoing challenge for the forseable future and I would be happy to pick an alternative if the pt will first  provide me a list of them -  pt  will need to return here for training for any new device that is required eg dpi vs hfa vs respimat.    In the meantime we can always provide samples so that the patient never runs out of any needed respiratory medications.   I had an extended discussion with the patient reviewing all relevant studies completed to date and  lasting 15 to 20 minutes of a 25 minute visit    I performed detailed device teaching using a teach back method which extended face to face time for this visit (see above)  Each maintenance medication was  reviewed in detail including emphasizing most importantly the difference between maintenance and prns and under what circumstances the prns are to be triggered using an action plan format that is not reflected in the computer generated alphabetically organized AVS which I have not found useful in most complex patients, especially with respiratory illnesses  Please see AVS for specific instructions unique to this visit that I personally wrote and verbalized to the the pt in detail and then reviewed with pt  by my nurse highlighting any  changes in therapy recommended at today's visit to their plan of care.

## 2018-08-14 DIAGNOSIS — M25511 Pain in right shoulder: Secondary | ICD-10-CM | POA: Diagnosis not present

## 2018-08-17 ENCOUNTER — Other Ambulatory Visit: Payer: Self-pay

## 2018-08-17 ENCOUNTER — Encounter: Payer: Self-pay | Admitting: Radiation Oncology

## 2018-08-17 ENCOUNTER — Ambulatory Visit
Admission: RE | Admit: 2018-08-17 | Discharge: 2018-08-17 | Disposition: A | Discharge status: Home / Self Care | Payer: Medicare HMO | Source: Ambulatory Visit | Attending: Radiation Oncology | Admitting: Radiation Oncology

## 2018-08-17 VITALS — BP 136/47 | HR 63 | Temp 97.7°F | Resp 18 | Ht 63.0 in

## 2018-08-17 DIAGNOSIS — Z7901 Long term (current) use of anticoagulants: Secondary | ICD-10-CM | POA: Insufficient documentation

## 2018-08-17 DIAGNOSIS — Z923 Personal history of irradiation: Secondary | ICD-10-CM | POA: Insufficient documentation

## 2018-08-17 DIAGNOSIS — C774 Secondary and unspecified malignant neoplasm of inguinal and lower limb lymph nodes: Secondary | ICD-10-CM | POA: Insufficient documentation

## 2018-08-17 DIAGNOSIS — C779 Secondary and unspecified malignant neoplasm of lymph node, unspecified: Secondary | ICD-10-CM | POA: Insufficient documentation

## 2018-08-17 DIAGNOSIS — C801 Malignant (primary) neoplasm, unspecified: Secondary | ICD-10-CM | POA: Diagnosis not present

## 2018-08-17 DIAGNOSIS — M25551 Pain in right hip: Secondary | ICD-10-CM | POA: Diagnosis not present

## 2018-08-17 DIAGNOSIS — Z79899 Other long term (current) drug therapy: Secondary | ICD-10-CM | POA: Insufficient documentation

## 2018-08-17 NOTE — Progress Notes (Signed)
Radiation Oncology         [phone] ________________________________  Name: Lorraine Gilbert MRN: [id number]  Date: 08/17/2018  DOB: 13-Jul-1932  Follow-Up Visit Note  CC: [name], MD  [name], MD    ICD-10-CM   1. Metastatic squamous cell carcinoma involving lymph node with unknown primary site [facility])  C77.9    C80.1     Diagnosis:   85 y.o. female with Metastatic squamous cell carcinomapresenting in the left inguinal area (unknown primary site)  Interval Since Last Radiation:  1 month  Radiation treatment dates:   07/05/2018 - 07/18/2018  Site/dose:   Left Inguinal / 30 Gy in 10 fractions  Narrative:  The patient returns today for routine follow-up.  Patient tolerated her radiation therapy well.  She denies any itching or skin problems along the left inguinal area.  Any significant fatigue.  Her major problem at this time is pain in the right hip.  She is scheduled for an injection the 11th with Dr. Nelva Bush.                              ALLERGIES:  is allergic to tylenol with codeine #3 [acetaminophen-codeine]; ibuprofen; and morphine.  Meds: Current Outpatient Medications  Medication Sig Dispense Refill  . apixaban (ELIQUIS) 5 MG TABS tablet Take 5 mg by mouth 2 (two) times daily.    . Cholecalciferol (VITAMIN D3) 50000 units CAPS Take 50,000 Units by mouth every Wednesday.    . digoxin (LANOXIN) 0.125 MG tablet Take 125 mcg by mouth every morning.     . diltiazem (CARDIZEM CD) 180 MG 24 hr capsule Take 180 mg by mouth daily.    Marland Kitchen escitalopram (LEXAPRO) 10 MG tablet Take 10 mg by mouth daily.    . fluticasone furoate-vilanterol (BREO ELLIPTA) 100-25 MCG/INH AEPB Inhale 1 puff into the lungs daily. 60 each 11  . folic acid (FOLVITE) 1 MG tablet Take 1 mg by mouth every morning.     . nitroGLYCERIN (NITROSTAT) 0.4 MG SL tablet DISSOLVE ONE TABLET UNDER THE TONGUE EVERY 5 MINUTES AS NEEDED FOR CHEST PAIN. DO NOT EXCEED A TOTAL OF 3 DOSES IN 15 MINUTES    .  pantoprazole (PROTONIX) 40 MG tablet Take 30- 60 min before your first and last meals of the day 60 tablet 2  . ticagrelor (BRILINTA) 90 MG TABS tablet Take by mouth.    . vitamin C (ASCORBIC ACID) 500 MG tablet Take 500 mg by mouth daily.     No current facility-administered medications for this encounter.     Physical Findings: The patient is in no acute distress. Patient is alert and oriented.  height is 5\' 3"  (1.6 m). Her temporal temperature is 97.7 F (36.5 C). Her blood pressure is 136/47 (abnormal) and her pulse is 63. Her respiration is 18 and oxygen saturation is 99%.   Lungs are clear to auscultation bilaterally. Heart has regular rate and rhythm. No palpable cervical, supraclavicular, or axillary adenopathy. Abdomen soft, non-tender, normal bowel sounds.  Examination of the left inguinal area reveals a barely palpable approximately residual 1 cm lymph nodes.  Patient skin is well-healed.  No significant skin changes.  Lab Findings: Lab Results  Component Value Date   WBC 9.4 07/28/2017   HGB 13.6 07/28/2017   HCT 43.6 07/28/2017   MCV 94.6 07/28/2017   PLT 256 07/28/2017    Radiographic Findings: No results found.  Impression:  Metastatic  squamous cell carcinomapresenting in the left inguinal area (unknown primary site).   Patient tolerated her radiation therapy well.  She had a  good clinical response in terms of lymph node shrinkage.  Plan: Routine follow-up in 3 months  ____________________________________  Blair Promise, PhD, MD  This document serves as a record of services personally performed by Gery Pray, MD. It was created on his behalf by Rae Lips, a trained medical scribe. The creation of this record is based on the scribe's personal observations and the provider's statements to them. This document has been checked and approved by the attending provider.

## 2018-08-17 NOTE — Patient Instructions (Signed)
Coronavirus (COVID-19) Are you at risk?  Are you at risk for the Coronavirus (COVID-19)?  To be considered HIGH RISK for Coronavirus (COVID-19), you have to meet the following criteria:  . Traveled to [place], [place], [place], [place] or [place]; or in the [place] to [place], [place], [place], or [place]; and have fever, cough, and shortness of breath within the last 2 weeks of travel OR . Been in close contact with a person diagnosed with COVID-19 within the last 2 weeks and have fever, cough, and shortness of breath . IF YOU DO NOT MEET THESE CRITERIA, YOU ARE CONSIDERED LOW RISK FOR COVID-19.  What to do if you are HIGH RISK for COVID-19?  . If you are having a medical emergency, call 911. . Seek medical care right away. Before you go to a doctor's office, urgent care or emergency department, call ahead and tell them about your recent travel, contact with someone diagnosed with COVID-19, and your symptoms. You should receive instructions from your physician's office regarding next steps of care.  . When you arrive at healthcare provider, tell the healthcare staff immediately you have returned from visiting [place], [place], [place], [place] or [place]; or traveled in the [place] to [place], [place], [place], or [place]; in the last two weeks or you have been in close contact with a person diagnosed with COVID-19 in the last 2 weeks.   . Tell the health care staff about your symptoms: fever, cough and shortness of breath. . After you have been seen by a medical provider, you will be either: o Tested for (COVID-19) and discharged home on quarantine except to seek medical care if symptoms worsen, and asked to  - Stay home and avoid contact with others until you get your results (4-5 days)  - Avoid travel on public transportation if possible (such as bus, train, or airplane) or o Sent to the Emergency Department by EMS for evaluation, COVID-19 testing, and possible  admission depending on your condition and test results.  What to do if you are LOW RISK for COVID-19?  Reduce your risk of any infection by using the same precautions used for avoiding the common cold or flu:  . Wash your hands often with soap and warm water for at least 20 seconds.  If soap and water are not readily available, use an alcohol-based hand sanitizer with at least 60% alcohol.  . If coughing or sneezing, cover your mouth and nose by coughing or sneezing into the elbow areas of your shirt or coat, into a tissue or into your sleeve (not your hands). . Avoid shaking hands with others and consider head nods or verbal greetings only. . Avoid touching your eyes, nose, or mouth with unwashed hands.  . Avoid close contact with people who are sick. . Avoid places or events with large numbers of people in one location, like concerts or sporting events. . Carefully consider travel plans you have or are making. . If you are planning any travel outside or inside the [place], visit the CDC's Travelers' Health webpage for the latest health notices. . If you have some symptoms but not all symptoms, continue to monitor at home and seek medical attention if your symptoms worsen. . If you are having a medical emergency, call 911.   ADDITIONAL HEALTHCARE OPTIONS FOR PATIENTS  [facility] / e-Visit: [email or web address]         [facility]: [phone]  [facility]: [phone]                   [facility]: [phone]   

## 2018-08-17 NOTE — Progress Notes (Signed)
Patient in for follow up. States she is having right hip pain 8/10 pain scale. She will occasionally jump from the pain states it is arthritis. Skin is healed. No issues with left inguinal area.

## 2018-08-18 DIAGNOSIS — I10 Essential (primary) hypertension: Secondary | ICD-10-CM | POA: Diagnosis not present

## 2018-08-18 DIAGNOSIS — E782 Mixed hyperlipidemia: Secondary | ICD-10-CM | POA: Diagnosis not present

## 2018-08-18 DIAGNOSIS — I4891 Unspecified atrial fibrillation: Secondary | ICD-10-CM | POA: Diagnosis not present

## 2018-08-18 DIAGNOSIS — R002 Palpitations: Secondary | ICD-10-CM | POA: Diagnosis not present

## 2018-08-18 DIAGNOSIS — I480 Paroxysmal atrial fibrillation: Secondary | ICD-10-CM | POA: Diagnosis not present

## 2018-08-18 DIAGNOSIS — Z7901 Long term (current) use of anticoagulants: Secondary | ICD-10-CM | POA: Diagnosis not present

## 2018-08-18 DIAGNOSIS — I351 Nonrheumatic aortic (valve) insufficiency: Secondary | ICD-10-CM | POA: Diagnosis not present

## 2018-08-18 DIAGNOSIS — E041 Nontoxic single thyroid nodule: Secondary | ICD-10-CM | POA: Diagnosis not present

## 2018-08-18 DIAGNOSIS — R0600 Dyspnea, unspecified: Secondary | ICD-10-CM | POA: Diagnosis not present

## 2018-08-22 DIAGNOSIS — R0600 Dyspnea, unspecified: Secondary | ICD-10-CM | POA: Diagnosis not present

## 2018-08-22 DIAGNOSIS — I4891 Unspecified atrial fibrillation: Secondary | ICD-10-CM | POA: Diagnosis not present

## 2018-08-22 DIAGNOSIS — I10 Essential (primary) hypertension: Secondary | ICD-10-CM | POA: Diagnosis not present

## 2018-08-22 DIAGNOSIS — E041 Nontoxic single thyroid nodule: Secondary | ICD-10-CM | POA: Diagnosis not present

## 2018-08-27 DIAGNOSIS — Z79899 Other long term (current) drug therapy: Secondary | ICD-10-CM | POA: Diagnosis not present

## 2018-08-27 DIAGNOSIS — Z886 Allergy status to analgesic agent status: Secondary | ICD-10-CM | POA: Diagnosis not present

## 2018-08-27 DIAGNOSIS — M79661 Pain in right lower leg: Secondary | ICD-10-CM | POA: Diagnosis not present

## 2018-08-27 DIAGNOSIS — Z8679 Personal history of other diseases of the circulatory system: Secondary | ICD-10-CM | POA: Diagnosis not present

## 2018-08-27 DIAGNOSIS — I10 Essential (primary) hypertension: Secondary | ICD-10-CM | POA: Diagnosis not present

## 2018-08-27 DIAGNOSIS — Z7902 Long term (current) use of antithrombotics/antiplatelets: Secondary | ICD-10-CM | POA: Diagnosis not present

## 2018-08-27 DIAGNOSIS — E785 Hyperlipidemia, unspecified: Secondary | ICD-10-CM | POA: Diagnosis not present

## 2018-08-27 DIAGNOSIS — M5441 Lumbago with sciatica, right side: Secondary | ICD-10-CM | POA: Diagnosis not present

## 2018-08-27 DIAGNOSIS — M5431 Sciatica, right side: Secondary | ICD-10-CM | POA: Diagnosis not present

## 2018-08-28 DIAGNOSIS — R002 Palpitations: Secondary | ICD-10-CM | POA: Diagnosis not present

## 2018-08-28 DIAGNOSIS — I4891 Unspecified atrial fibrillation: Secondary | ICD-10-CM | POA: Diagnosis not present

## 2018-08-29 DIAGNOSIS — M5416 Radiculopathy, lumbar region: Secondary | ICD-10-CM | POA: Diagnosis not present

## 2018-09-07 ENCOUNTER — Ambulatory Visit: Payer: Medicare HMO

## 2018-09-08 ENCOUNTER — Encounter: Payer: Self-pay | Admitting: Neurology

## 2018-09-08 ENCOUNTER — Ambulatory Visit: Payer: Medicare HMO | Admitting: Neurology

## 2018-09-08 ENCOUNTER — Other Ambulatory Visit: Payer: Self-pay

## 2018-09-08 VITALS — BP 158/53 | HR 60 | Ht 63.0 in | Wt 127.5 lb

## 2018-09-08 DIAGNOSIS — F0390 Unspecified dementia without behavioral disturbance: Secondary | ICD-10-CM | POA: Diagnosis not present

## 2018-09-08 DIAGNOSIS — R42 Dizziness and giddiness: Secondary | ICD-10-CM | POA: Diagnosis not present

## 2018-09-08 DIAGNOSIS — F03A0 Unspecified dementia, mild, without behavioral disturbance, psychotic disturbance, mood disturbance, and anxiety: Secondary | ICD-10-CM
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
## 2018-09-08 NOTE — Patient Instructions (Signed)
1. Refer to Vestibular therapy for dizziness 2. Follow-up in 6-8 months, call for any changes   RECOMMENDATIONS FOR ALL PATIENTS WITH MEMORY PROBLEMS: 1. Continue to exercise (Recommend 30 minutes of walking everyday, or 3 hours every week) 2. Increase social interactions - continue going to [place] and enjoy social gatherings with friends and family 3. Eat healthy, avoid fried foods and eat more fruits and vegetables 4. Maintain adequate blood pressure, blood sugar, and blood cholesterol level. Reducing the risk of stroke and cardiovascular disease also helps promoting better memory. 5. Avoid stressful situations. Live a simple life and avoid aggravations. Organize your time and prepare for the next day in anticipation. 6. Sleep well, avoid any interruptions of sleep and avoid any distractions in the bedroom that may interfere with adequate sleep quality 7. Avoid sugar, avoid sweets as there is a strong link between excessive sugar intake, diabetes, and cognitive impairment The Mediterranean diet has been shown to help patients reduce the risk of progressive memory disorders and reduces cardiovascular risk. This includes eating fish, eat fruits and green leafy vegetables, nuts like almonds and hazelnuts, walnuts, and also use olive oil. Avoid fast foods and fried foods as much as possible. Avoid sweets and sugar as sugar use has been linked to worsening of memory function.

## 2018-09-15 ENCOUNTER — Telehealth: Payer: Self-pay | Admitting: Neurology

## 2018-09-15 ENCOUNTER — Other Ambulatory Visit: Payer: Self-pay

## 2018-09-15 DIAGNOSIS — R42 Dizziness and giddiness: Secondary | ICD-10-CM

## 2018-09-15 NOTE — Telephone Encounter (Signed)
Patient left msg about a referral that was supposed to be sent to an ear specialist for her. Please call her back with an update. Thanks!

## 2018-09-15 NOTE — Telephone Encounter (Signed)
Order placed for Vestibular therapy to neuro rehab. They will call pt with appt date and time. Called pt to inform. No answer. Left message informing pt.

## 2018-10-02 DIAGNOSIS — I480 Paroxysmal atrial fibrillation: Secondary | ICD-10-CM | POA: Diagnosis not present

## 2018-10-16 DIAGNOSIS — F0390 Unspecified dementia without behavioral disturbance: Secondary | ICD-10-CM | POA: Diagnosis not present

## 2018-10-16 DIAGNOSIS — E1165 Type 2 diabetes mellitus with hyperglycemia: Secondary | ICD-10-CM | POA: Diagnosis not present

## 2018-10-16 DIAGNOSIS — M069 Rheumatoid arthritis, unspecified: Secondary | ICD-10-CM | POA: Diagnosis not present

## 2018-10-16 DIAGNOSIS — J45909 Unspecified asthma, uncomplicated: Secondary | ICD-10-CM | POA: Diagnosis not present

## 2018-10-16 DIAGNOSIS — H409 Unspecified glaucoma: Secondary | ICD-10-CM | POA: Diagnosis not present

## 2018-10-16 DIAGNOSIS — J45991 Cough variant asthma: Secondary | ICD-10-CM | POA: Diagnosis not present

## 2018-10-16 DIAGNOSIS — I2510 Atherosclerotic heart disease of native coronary artery without angina pectoris: Secondary | ICD-10-CM | POA: Diagnosis not present

## 2018-10-16 DIAGNOSIS — E7800 Pure hypercholesterolemia, unspecified: Secondary | ICD-10-CM | POA: Diagnosis not present

## 2018-10-16 DIAGNOSIS — I4891 Unspecified atrial fibrillation: Secondary | ICD-10-CM | POA: Diagnosis not present

## 2018-10-17 DIAGNOSIS — I480 Paroxysmal atrial fibrillation: Secondary | ICD-10-CM | POA: Diagnosis not present

## 2018-10-17 DIAGNOSIS — E041 Nontoxic single thyroid nodule: Secondary | ICD-10-CM | POA: Diagnosis not present

## 2018-10-17 DIAGNOSIS — I10 Essential (primary) hypertension: Secondary | ICD-10-CM | POA: Diagnosis not present

## 2018-10-17 DIAGNOSIS — I2510 Atherosclerotic heart disease of native coronary artery without angina pectoris: Secondary | ICD-10-CM | POA: Diagnosis not present

## 2018-10-17 DIAGNOSIS — E782 Mixed hyperlipidemia: Secondary | ICD-10-CM | POA: Diagnosis not present

## 2018-10-17 DIAGNOSIS — I351 Nonrheumatic aortic (valve) insufficiency: Secondary | ICD-10-CM | POA: Diagnosis not present

## 2018-10-17 DIAGNOSIS — Z7901 Long term (current) use of anticoagulants: Secondary | ICD-10-CM | POA: Diagnosis not present

## 2018-10-17 DIAGNOSIS — R002 Palpitations: Secondary | ICD-10-CM | POA: Diagnosis not present

## 2018-10-17 DIAGNOSIS — M79604 Pain in right leg: Secondary | ICD-10-CM | POA: Diagnosis not present

## 2018-10-18 ENCOUNTER — Ambulatory Visit
Admission: RE | Admit: 2018-10-18 | Discharge: 2018-10-18 | Disposition: A | Discharge status: Home / Self Care | Payer: Medicare HMO | Source: Ambulatory Visit | Attending: Family Medicine | Admitting: Family Medicine

## 2018-10-18 ENCOUNTER — Other Ambulatory Visit: Payer: Self-pay

## 2018-10-18 DIAGNOSIS — Z1231 Encounter for screening mammogram for malignant neoplasm of breast: Secondary | ICD-10-CM | POA: Diagnosis not present

## 2018-10-24 DIAGNOSIS — R05 Cough: Secondary | ICD-10-CM | POA: Diagnosis not present

## 2018-10-26 DIAGNOSIS — R7989 Other specified abnormal findings of blood chemistry: Secondary | ICD-10-CM | POA: Diagnosis not present

## 2018-10-26 DIAGNOSIS — I509 Heart failure, unspecified: Secondary | ICD-10-CM | POA: Diagnosis not present

## 2018-10-26 DIAGNOSIS — I517 Cardiomegaly: Secondary | ICD-10-CM | POA: Diagnosis not present

## 2018-10-26 DIAGNOSIS — I4891 Unspecified atrial fibrillation: Secondary | ICD-10-CM | POA: Diagnosis not present

## 2018-10-26 DIAGNOSIS — R0602 Shortness of breath: Secondary | ICD-10-CM | POA: Diagnosis not present

## 2018-10-26 DIAGNOSIS — Z23 Encounter for immunization: Secondary | ICD-10-CM | POA: Diagnosis not present

## 2018-10-26 DIAGNOSIS — E785 Hyperlipidemia, unspecified: Secondary | ICD-10-CM | POA: Diagnosis not present

## 2018-10-26 DIAGNOSIS — R05 Cough: Secondary | ICD-10-CM | POA: Diagnosis not present

## 2018-10-26 DIAGNOSIS — I482 Chronic atrial fibrillation, unspecified: Secondary | ICD-10-CM | POA: Diagnosis not present

## 2018-10-26 DIAGNOSIS — F419 Anxiety disorder, unspecified: Secondary | ICD-10-CM | POA: Diagnosis not present

## 2018-10-26 DIAGNOSIS — Z7901 Long term (current) use of anticoagulants: Secondary | ICD-10-CM | POA: Diagnosis not present

## 2018-10-26 DIAGNOSIS — I110 Hypertensive heart disease with heart failure: Secondary | ICD-10-CM | POA: Diagnosis not present

## 2018-10-26 DIAGNOSIS — I2510 Atherosclerotic heart disease of native coronary artery without angina pectoris: Secondary | ICD-10-CM | POA: Diagnosis not present

## 2018-10-27 DIAGNOSIS — F418 Other specified anxiety disorders: Secondary | ICD-10-CM | POA: Diagnosis not present

## 2018-10-27 DIAGNOSIS — I519 Heart disease, unspecified: Secondary | ICD-10-CM | POA: Diagnosis not present

## 2018-10-27 DIAGNOSIS — Z7901 Long term (current) use of anticoagulants: Secondary | ICD-10-CM | POA: Diagnosis not present

## 2018-10-27 DIAGNOSIS — I2510 Atherosclerotic heart disease of native coronary artery without angina pectoris: Secondary | ICD-10-CM | POA: Diagnosis not present

## 2018-10-27 DIAGNOSIS — I517 Cardiomegaly: Secondary | ICD-10-CM | POA: Diagnosis not present

## 2018-10-27 DIAGNOSIS — Z955 Presence of coronary angioplasty implant and graft: Secondary | ICD-10-CM | POA: Diagnosis not present

## 2018-10-27 DIAGNOSIS — I10 Essential (primary) hypertension: Secondary | ICD-10-CM | POA: Diagnosis not present

## 2018-10-27 DIAGNOSIS — I5032 Chronic diastolic (congestive) heart failure: Secondary | ICD-10-CM | POA: Diagnosis not present

## 2018-10-27 DIAGNOSIS — I083 Combined rheumatic disorders of mitral, aortic and tricuspid valves: Secondary | ICD-10-CM | POA: Diagnosis not present

## 2018-10-27 DIAGNOSIS — I351 Nonrheumatic aortic (valve) insufficiency: Secondary | ICD-10-CM | POA: Diagnosis not present

## 2018-10-27 DIAGNOSIS — I4891 Unspecified atrial fibrillation: Secondary | ICD-10-CM | POA: Diagnosis not present

## 2018-10-27 DIAGNOSIS — R778 Other specified abnormalities of plasma proteins: Secondary | ICD-10-CM | POA: Diagnosis not present

## 2018-10-28 DIAGNOSIS — I4891 Unspecified atrial fibrillation: Secondary | ICD-10-CM | POA: Diagnosis not present

## 2018-10-28 DIAGNOSIS — I10 Essential (primary) hypertension: Secondary | ICD-10-CM | POA: Diagnosis not present

## 2018-10-28 DIAGNOSIS — I351 Nonrheumatic aortic (valve) insufficiency: Secondary | ICD-10-CM | POA: Diagnosis not present

## 2018-10-28 DIAGNOSIS — I5032 Chronic diastolic (congestive) heart failure: Secondary | ICD-10-CM | POA: Diagnosis not present

## 2018-10-28 DIAGNOSIS — Z955 Presence of coronary angioplasty implant and graft: Secondary | ICD-10-CM | POA: Diagnosis not present

## 2018-10-28 DIAGNOSIS — F418 Other specified anxiety disorders: Secondary | ICD-10-CM | POA: Diagnosis not present

## 2018-10-28 DIAGNOSIS — Z7901 Long term (current) use of anticoagulants: Secondary | ICD-10-CM | POA: Diagnosis not present

## 2018-10-28 DIAGNOSIS — I2510 Atherosclerotic heart disease of native coronary artery without angina pectoris: Secondary | ICD-10-CM | POA: Diagnosis not present

## 2018-10-28 DIAGNOSIS — R778 Other specified abnormalities of plasma proteins: Secondary | ICD-10-CM | POA: Diagnosis not present

## 2018-10-30 ENCOUNTER — Telehealth: Payer: Self-pay | Admitting: Neurology

## 2018-10-30 DIAGNOSIS — E041 Nontoxic single thyroid nodule: Secondary | ICD-10-CM | POA: Diagnosis not present

## 2018-10-30 NOTE — Telephone Encounter (Signed)
Patient is wanting an MRI of her head. She is dizzy and just left the hospital. She said they did not run the testing while she was in there. She is wanting to have it done to see what is going on with her. Can yall order? Thanks!

## 2018-10-30 NOTE — Telephone Encounter (Signed)
Left message for pt to return call?  Why did she go to ER?  Did she go for Vestibular Therapy at [facility]?

## 2018-11-09 DIAGNOSIS — M0579 Rheumatoid arthritis with rheumatoid factor of multiple sites without organ or systems involvement: Secondary | ICD-10-CM | POA: Diagnosis not present

## 2018-11-09 DIAGNOSIS — M5431 Sciatica, right side: Secondary | ICD-10-CM | POA: Diagnosis not present

## 2018-11-09 DIAGNOSIS — M79604 Pain in right leg: Secondary | ICD-10-CM | POA: Diagnosis not present

## 2018-11-09 DIAGNOSIS — Z6822 Body mass index (BMI) 22.0-22.9, adult: Secondary | ICD-10-CM | POA: Diagnosis not present

## 2018-11-09 DIAGNOSIS — M353 Polymyalgia rheumatica: Secondary | ICD-10-CM | POA: Diagnosis not present

## 2018-11-09 DIAGNOSIS — M159 Polyosteoarthritis, unspecified: Secondary | ICD-10-CM | POA: Diagnosis not present

## 2018-11-09 DIAGNOSIS — Z79899 Other long term (current) drug therapy: Secondary | ICD-10-CM | POA: Diagnosis not present

## 2018-11-09 DIAGNOSIS — M2550 Pain in unspecified joint: Secondary | ICD-10-CM | POA: Diagnosis not present

## 2018-11-09 DIAGNOSIS — M79605 Pain in left leg: Secondary | ICD-10-CM | POA: Diagnosis not present

## 2018-11-10 DIAGNOSIS — R634 Abnormal weight loss: Secondary | ICD-10-CM | POA: Diagnosis not present

## 2018-11-10 DIAGNOSIS — C779 Secondary and unspecified malignant neoplasm of lymph node, unspecified: Secondary | ICD-10-CM | POA: Diagnosis not present

## 2018-11-10 DIAGNOSIS — C801 Malignant (primary) neoplasm, unspecified: Secondary | ICD-10-CM | POA: Diagnosis not present

## 2018-11-13 DIAGNOSIS — M79605 Pain in left leg: Secondary | ICD-10-CM | POA: Diagnosis not present

## 2018-11-13 DIAGNOSIS — M79604 Pain in right leg: Secondary | ICD-10-CM | POA: Diagnosis not present

## 2018-11-14 DIAGNOSIS — F0390 Unspecified dementia without behavioral disturbance: Secondary | ICD-10-CM | POA: Diagnosis not present

## 2018-11-14 DIAGNOSIS — I480 Paroxysmal atrial fibrillation: Secondary | ICD-10-CM | POA: Diagnosis not present

## 2018-11-14 DIAGNOSIS — E7800 Pure hypercholesterolemia, unspecified: Secondary | ICD-10-CM | POA: Diagnosis not present

## 2018-11-14 DIAGNOSIS — I2510 Atherosclerotic heart disease of native coronary artery without angina pectoris: Secondary | ICD-10-CM | POA: Diagnosis not present

## 2018-11-14 DIAGNOSIS — M069 Rheumatoid arthritis, unspecified: Secondary | ICD-10-CM | POA: Diagnosis not present

## 2018-11-14 DIAGNOSIS — J45991 Cough variant asthma: Secondary | ICD-10-CM | POA: Diagnosis not present

## 2018-11-14 DIAGNOSIS — E1165 Type 2 diabetes mellitus with hyperglycemia: Secondary | ICD-10-CM | POA: Diagnosis not present

## 2018-11-14 DIAGNOSIS — I4891 Unspecified atrial fibrillation: Secondary | ICD-10-CM | POA: Diagnosis not present

## 2018-11-14 DIAGNOSIS — J45909 Unspecified asthma, uncomplicated: Secondary | ICD-10-CM | POA: Diagnosis not present

## 2018-11-16 ENCOUNTER — Ambulatory Visit: Payer: Self-pay | Admitting: Radiation Oncology

## 2018-11-17 DIAGNOSIS — M79604 Pain in right leg: Secondary | ICD-10-CM | POA: Diagnosis not present

## 2018-11-17 DIAGNOSIS — I10 Essential (primary) hypertension: Secondary | ICD-10-CM | POA: Diagnosis not present

## 2018-11-17 DIAGNOSIS — Z955 Presence of coronary angioplasty implant and graft: Secondary | ICD-10-CM | POA: Diagnosis not present

## 2018-11-17 DIAGNOSIS — I5032 Chronic diastolic (congestive) heart failure: Secondary | ICD-10-CM | POA: Diagnosis not present

## 2018-11-17 DIAGNOSIS — E782 Mixed hyperlipidemia: Secondary | ICD-10-CM | POA: Diagnosis not present

## 2018-11-17 DIAGNOSIS — M79605 Pain in left leg: Secondary | ICD-10-CM | POA: Diagnosis not present

## 2018-11-17 DIAGNOSIS — Z7901 Long term (current) use of anticoagulants: Secondary | ICD-10-CM | POA: Diagnosis not present

## 2018-11-17 DIAGNOSIS — I4891 Unspecified atrial fibrillation: Secondary | ICD-10-CM | POA: Diagnosis not present

## 2018-11-20 ENCOUNTER — Telehealth: Payer: Self-pay | Admitting: Neurology

## 2018-11-20 NOTE — Telephone Encounter (Signed)
Patient is still having dizziness and is wanting to see if we will order her a MRI

## 2018-11-21 NOTE — Telephone Encounter (Signed)
Pt never went to PT for Vestibular Therapy. In the referral not they left pt a message to return call.   Pt wants to know what to do. Therapy vs MRI

## 2018-11-21 NOTE — Telephone Encounter (Signed)
She has had several brain imaging already, would do vestibular therapy, pls give her number to call, thanks

## 2018-11-21 NOTE — Telephone Encounter (Signed)
Left message for pt to return call.  Did pt go to PT for vestibular therapy? Referral was placed.

## 2018-11-21 NOTE — Telephone Encounter (Signed)
Patient left vm ret your call

## 2018-11-22 NOTE — Telephone Encounter (Signed)
Called spoke with patient she was given contact information to neuro rehab for PT vestibular therapy. Referral was placed back in Sept.  Pt will call to schedule.

## 2018-11-23 ENCOUNTER — Ambulatory Visit
Admission: RE | Admit: 2018-11-23 | Discharge: 2018-11-23 | Disposition: A | Discharge status: Home / Self Care | Payer: Medicare HMO | Source: Ambulatory Visit | Attending: Radiation Oncology | Admitting: Radiation Oncology

## 2018-11-23 ENCOUNTER — Other Ambulatory Visit: Payer: Self-pay

## 2018-11-23 ENCOUNTER — Encounter: Payer: Self-pay | Admitting: Radiation Oncology

## 2018-11-23 VITALS — BP 149/47 | HR 60 | Temp 98.5°F | Resp 18 | Ht 63.0 in | Wt 129.0 lb

## 2018-11-23 DIAGNOSIS — Z79899 Other long term (current) drug therapy: Secondary | ICD-10-CM | POA: Diagnosis not present

## 2018-11-23 DIAGNOSIS — C774 Secondary and unspecified malignant neoplasm of inguinal and lower limb lymph nodes: Secondary | ICD-10-CM | POA: Diagnosis not present

## 2018-11-23 DIAGNOSIS — C801 Malignant (primary) neoplasm, unspecified: Secondary | ICD-10-CM | POA: Diagnosis not present

## 2018-11-23 DIAGNOSIS — I4891 Unspecified atrial fibrillation: Secondary | ICD-10-CM | POA: Diagnosis not present

## 2018-11-23 DIAGNOSIS — R102 Pelvic and perineal pain: Secondary | ICD-10-CM | POA: Diagnosis not present

## 2018-11-23 DIAGNOSIS — G8929 Other chronic pain: Secondary | ICD-10-CM | POA: Insufficient documentation

## 2018-11-23 DIAGNOSIS — Z7901 Long term (current) use of anticoagulants: Secondary | ICD-10-CM | POA: Insufficient documentation

## 2018-11-23 DIAGNOSIS — Z08 Encounter for follow-up examination after completed treatment for malignant neoplasm: Secondary | ICD-10-CM | POA: Diagnosis not present

## 2018-11-23 DIAGNOSIS — C779 Secondary and unspecified malignant neoplasm of lymph node, unspecified: Secondary | ICD-10-CM

## 2018-11-23 NOTE — Progress Notes (Signed)
Radiation Oncology         [phone] ________________________________  Name: Arnel Hamblet MRN: [id number]  Date: 11/23/2018  DOB: 1932-06-18  Follow-Up Visit Note  CC: [name], MD  [name], MD    ICD-10-CM   1. Metastatic squamous cell carcinoma involving lymph node with unknown primary site [facility])  C77.9    C80.1     Diagnosis:   85 y.o. female with Metastatic squamous cell carcinomapresenting in the left inguinal area (unknown primary site)  Interval Since Last Radiation:  4 months 07/05/2018 - 07/18/2018: Left Inguinal / 30 Gy in 10 fractions  Narrative:  The patient returns today for routine follow-up.  She denies any pain along the left inguinal area vaginal bleeding rectal bleeding or hematuria.  She has chronic pain in the right pelvis with what she describes as sciatica.  Patient has had some trips to the emergency room for management of atrial fibrillation  On review of systems, she reports a good appetite  ALLERGIES:  is allergic to tylenol with codeine #3 [acetaminophen-codeine]; ibuprofen; and morphine.  Meds: Current Outpatient Medications  Medication Sig Dispense Refill  . amiodarone (PACERONE) 200 MG tablet Take by mouth.    Marland Kitchen apixaban (ELIQUIS) 5 MG TABS tablet Take 5 mg by mouth 2 (two) times daily.    Marland Kitchen atorvastatin (LIPITOR) 20 MG tablet     . escitalopram (LEXAPRO) 10 MG tablet Take 10 mg by mouth daily.    . folic acid (FOLVITE) 1 MG tablet Take 1 mg by mouth every morning.     . nitroGLYCERIN (NITROSTAT) 0.4 MG SL tablet DISSOLVE ONE TABLET UNDER THE TONGUE EVERY 5 MINUTES AS NEEDED FOR CHEST PAIN. DO NOT EXCEED A TOTAL OF 3 DOSES IN 15 MINUTES    . pantoprazole (PROTONIX) 40 MG tablet Take 30- 60 min before your first and last meals of the day 60 tablet 2  . ticagrelor (BRILINTA) 90 MG TABS tablet Take by mouth.    . Cholecalciferol (VITAMIN D3) 50000 units CAPS Take 50,000 Units by mouth every Wednesday.    . digoxin (LANOXIN) 0.125 MG  tablet Take 125 mcg by mouth every morning.     . diltiazem (CARDIZEM CD) 180 MG 24 hr capsule Take 180 mg by mouth daily.    . fluticasone furoate-vilanterol (BREO ELLIPTA) 100-25 MCG/INH AEPB Inhale 1 puff into the lungs daily. (Patient not taking: Reported on 11/23/2018) 60 each 11  . Menthol-Methyl Salicylate (BEN GAY GREASELESS) 10-15 % greaseless cream Apply topically.    . vitamin C (ASCORBIC ACID) 500 MG tablet Take 500 mg by mouth daily.     No current facility-administered medications for this encounter.     Physical Findings: The patient is in no acute distress. Patient is alert and oriented.  height is 5\' 3"  (1.6 m) and weight is 129 lb (58.5 kg). Her temporal temperature is 98.5 F (36.9 C). Her blood pressure is 149/47 (abnormal) and her pulse is 60. Her respiration is 18 and oxygen saturation is 99%.   Lungs are clear to auscultation bilaterally. Heart has regular rate and rhythm. No palpable cervical, supraclavicular, or axillary adenopathy. Abdomen soft, non-tender, normal bowel sounds.   Examination of the inguinal areas reveals no palpable adenopathy.  No appreciable radiation changes noted in the left inguinal area.  On pelvic examination the external genitalia are unremarkable.  A speculum exam is performed.  There are no mucosal lesions noted in the vaginal vault.  On bimanual and rectovaginal examination  no pelvic masses appreciated.  Lab Findings: Lab Results  Component Value Date   WBC 9.4 07/28/2017   HGB 13.6 07/28/2017   HCT 43.6 07/28/2017   MCV 94.6 07/28/2017   PLT 256 07/28/2017    Radiographic Findings: No results found.  Impression:  Metastatic squamous cell carcinomapresenting in the left inguinal area (unknown primary site).   No evidence of recurrence on clinical exam today   Plan: Routine follow-up in 3 months.  May consider repeating scans at that time.  ____________________________________  Blair Promise, PhD, MD  This document serves as  a record of services personally performed by Gery Pray, MD. It was created on his behalf by Wilburn Mylar, a trained medical scribe. The creation of this record is based on the scribe's personal observations and the provider's statements to them. This document has been checked and approved by the attending provider.

## 2018-11-23 NOTE — Patient Instructions (Signed)
Coronavirus (COVID-19) Are you at risk?  Are you at risk for the Coronavirus (COVID-19)?  To be considered HIGH RISK for Coronavirus (COVID-19), you have to meet the following criteria:  . Traveled to [place], [place], [place], [place] or [place]; or in the [place] to [place], [place], [place], or [place]; and have fever, cough, and shortness of breath within the last 2 weeks of travel OR . Been in close contact with a person diagnosed with COVID-19 within the last 2 weeks and have fever, cough, and shortness of breath . IF YOU DO NOT MEET THESE CRITERIA, YOU ARE CONSIDERED LOW RISK FOR COVID-19.  What to do if you are HIGH RISK for COVID-19?  . If you are having a medical emergency, call 911. . Seek medical care right away. Before you go to a doctor's office, urgent care or emergency department, call ahead and tell them about your recent travel, contact with someone diagnosed with COVID-19, and your symptoms. You should receive instructions from your physician's office regarding next steps of care.  . When you arrive at healthcare provider, tell the healthcare staff immediately you have returned from visiting [place], [place], [place], [place] or [place]; or traveled in the [place] to [place], [place], [place], or [place]; in the last two weeks or you have been in close contact with a person diagnosed with COVID-19 in the last 2 weeks.   . Tell the health care staff about your symptoms: fever, cough and shortness of breath. . After you have been seen by a medical provider, you will be either: o Tested for (COVID-19) and discharged home on quarantine except to seek medical care if symptoms worsen, and asked to  - Stay home and avoid contact with others until you get your results (4-5 days)  - Avoid travel on public transportation if possible (such as bus, train, or airplane) or o Sent to the Emergency Department by EMS for evaluation, COVID-19 testing, and possible  admission depending on your condition and test results.  What to do if you are LOW RISK for COVID-19?  Reduce your risk of any infection by using the same precautions used for avoiding the common cold or flu:  . Wash your hands often with soap and warm water for at least 20 seconds.  If soap and water are not readily available, use an alcohol-based hand sanitizer with at least 60% alcohol.  . If coughing or sneezing, cover your mouth and nose by coughing or sneezing into the elbow areas of your shirt or coat, into a tissue or into your sleeve (not your hands). . Avoid shaking hands with others and consider head nods or verbal greetings only. . Avoid touching your eyes, nose, or mouth with unwashed hands.  . Avoid close contact with people who are sick. . Avoid places or events with large numbers of people in one location, like concerts or sporting events. . Carefully consider travel plans you have or are making. . If you are planning any travel outside or inside the [place], visit the CDC's Travelers' Health webpage for the latest health notices. . If you have some symptoms but not all symptoms, continue to monitor at home and seek medical attention if your symptoms worsen. . If you are having a medical emergency, call 911.   ADDITIONAL HEALTHCARE OPTIONS FOR PATIENTS  [facility] / e-Visit: [email or web address]         [facility]: [phone]  [facility]: [phone]                   [facility]: [phone]   

## 2018-11-23 NOTE — Progress Notes (Signed)
Lorraine Gilbert presents today for f/u with Dr. Sondra Come. Pt denies any concerns re: LEFT inguinal area/radiation field. Pt does report continued sciatica pain RIGHT hip area. Pt is working with PCP re: dizziness.   BP (!) 149/47 (BP Location: Left Arm, Patient Position: Sitting)   Pulse 60   Temp 98.5 F (36.9 C) (Temporal)   Resp 18   Ht 5\' 3"  (1.6 m)   Wt 129 lb (58.5 kg)   SpO2 99%   BMI 22.85 kg/m   Wt Readings from Last 3 Encounters:  11/23/18 129 lb (58.5 kg)  09/08/18 127 lb 8 oz (57.8 kg)  08/11/18 127 lb (57.6 kg)   Loma Sousa, RN BSN

## 2018-11-24 ENCOUNTER — Other Ambulatory Visit: Payer: Self-pay | Admitting: Gastroenterology

## 2018-11-24 DIAGNOSIS — K5904 Chronic idiopathic constipation: Secondary | ICD-10-CM | POA: Diagnosis not present

## 2018-11-24 DIAGNOSIS — C801 Malignant (primary) neoplasm, unspecified: Secondary | ICD-10-CM | POA: Diagnosis not present

## 2018-11-24 DIAGNOSIS — I2510 Atherosclerotic heart disease of native coronary artery without angina pectoris: Secondary | ICD-10-CM | POA: Diagnosis not present

## 2018-11-24 DIAGNOSIS — C779 Secondary and unspecified malignant neoplasm of lymph node, unspecified: Secondary | ICD-10-CM | POA: Diagnosis not present

## 2018-11-24 DIAGNOSIS — R634 Abnormal weight loss: Secondary | ICD-10-CM | POA: Diagnosis not present

## 2018-11-24 DIAGNOSIS — I480 Paroxysmal atrial fibrillation: Secondary | ICD-10-CM | POA: Diagnosis not present

## 2018-12-04 ENCOUNTER — Ambulatory Visit: Payer: Medicare HMO | Admitting: Internal Medicine

## 2018-12-06 ENCOUNTER — Encounter: Payer: Self-pay | Admitting: Internal Medicine

## 2018-12-06 ENCOUNTER — Other Ambulatory Visit: Payer: Self-pay

## 2018-12-06 ENCOUNTER — Ambulatory Visit: Payer: Medicare HMO | Admitting: Internal Medicine

## 2018-12-06 DIAGNOSIS — J45991 Cough variant asthma: Secondary | ICD-10-CM | POA: Diagnosis not present

## 2018-12-06 MED ORDER — BREO ELLIPTA 100-25 MCG/INH IN AEPB: 1.0000 | INHALATION_SPRAY | Freq: Every day | RESPIRATORY_TRACT | 0 refills | Status: DC

## 2018-12-06 NOTE — Patient Instructions (Addendum)
Continue BREO one click each am x 2 week sample   In two weeks  you will need to return to see one of our NP 's with your drug formulary in hand to pick a cheaper alternative - pick a day when our pharmacist will be here to help you    Please schedule a follow up office visit in 6 months , call sooner if needed with all medications /inhalers/ solutions in hand so we can verify exactly what you are taking. This includes all medications from all doctors and over the [place] separate them into two bags:  the ones you take automatically, no matter what, vs the ones you take just when you feel you need them "BAG #2 is UP TO YOU"  - this will really help [place] help you take your medications more effectively.

## 2018-12-06 NOTE — Progress Notes (Signed)
Subjective:   Patient ID: Lorraine Gilbert, female    DOB: August 22, 1932  MRN: [id number]        Brief patient profile:  64 yf from [place]  never smoker with cough and sob x decades previously eval by Dr Bernita Buffy referred by Dr Moreen Fowler 07/24/2012 for further evaluation of sob and cough with perfectly nl pfts 12/04/2013     History of Present Illness  07/24/2012 1st pulmonary eval cc cough x 2 months indolent onset progressively worse that is not better on advair, min prod of white mucus more day than night, assoc with mild sob. rec Stop advair Start dulera 100 Take 2 puffs first thing in am and then another 2 puffs about 12 hours later.  Pantoprazole (protonix) 40 mg  Take 30-60 min before first meal of the day and Pepcid 20 mg one bedtime until return to office - this is the best way to tell whether stomach acid is contributing to your problem.   GERD   If not better return here in 2 weeks all bottles/inhalers from all doctors or return to [facility] and if you want to change to [facility] we will need you to bring your records with you as well >> did not return as rec      01/27/2018  f/u ov/Alka Falwell re: uacs improved on 1st gen H1 blockers per guidelines / finally brought meds as req   Chief Complaint  Patient presents with  . Follow-up    Her breathing is unchanged. She states still having hoarseness. No new co's.    Dyspnea:  MMRC2 = can't walk a nl pace on a flat grade s sob but does fine slow and flat eg shopping fine  Cough: in am only a cough or two  then ok  The rest of the day  Sleeping: flat ok  SABA use: none  rec Continue For drainage / throat tickle try take CHLORPHENIRAMINE  4 mg - take one every 4 hours as needed     06/22/2018 acute extended ov/Ersel Wadleigh re:  Flare of asthma off ics/laba Chief Complaint  Patient presents with  . Acute Visit    Increased SOB x 2 months- out of symbicort for about that time.   covid pcr neg 06/05/18  Doe x 100 ft =  MMRC3 = can't walk  100 yards even at a slow pace at a flat grade s stopping due to sob No cough  Sleeping on side/ 2 pillows/ bed iflat  No 02    Very confused with meds, poor correlation with those she brought vs what her med st indicates she takes  rec Resume dulera 100 Take 2 puffs first thing in am and then another 2 puffs about 12 hours later.  Work on inhaler technique:  relax and gently blow all the way out then take a nice smooth deep breath back in, triggering the inhaler at same time you start breathing in.  Hold for up to 5 seconds if you can. Blow out thru nose. Rinse and gargle with water when done If not better or can't afford dulera 100  >>> return in 2 weeks    07/12/2018  f/u ov/Britt Petroni re: asthma/ not aderent due to cost of care / brought meds but not med calendar Chief Complaint  Patient presents with  . Follow-up    Breathing has improved some. She coughs some at night, occ will wake her up.   Dyspnea:  Does fine as long as access to  dulera / much worse when runs out, has 36 doses left on one we gave her last ov Cough: no excess mucus Sleeping: better / sleeps flat with 2 pillows - only wakes up if doesn't take pm dulera SABA use: min  02: none rec Breo 100 take one puff each am  Please schedule a follow up office visit in 4 weeks   08/11/2018  f/u ov/Lamar Naef re: asthma/ non adeherent  ? Number on sample- the one med she  did not bring as req  Chief Complaint  Patient presents with  . Follow-up    Breathing is doing well and no new co's.   Dyspnea:  MMRC1 = can walk nl pace, flat grade, can't hurry or go uphills or steps s sob  Cough: some noct but mostly sporadic, daytime, dry   Sleeping: fine once asleep bed is flat SABA use: none 02: none  rec Continue BREO one click each am  If not able to afford BREO, you will need to return to see one of our NP 's with your drug formulary in hand to pick a cheaper alternative > did not return "lives off samples"   12/06/2018  f/u ov/Taveon Enyeart re:   Cough variant asthma/ maint on amiodarone  Chief Complaint  Patient presents with  . Acute Visit    Pt c/o cough and wheezing x 3 wks "ithcy cough"- non prod. She ran out of Breo 2 wks ago due to cost.    Dyspnea:  Worse off breo = MMRC3 = can't walk 100 yards even at a slow pace at a flat grade s stopping due to sob  Cough: worse off breo day > noct, dry Sleeping: bed is flat / sleeps on 2 pillows  SABA use: none  02: none    No obvious day to day or daytime variability or assoc excess/ purulent sputum or mucus plugs or hemoptysis or cp or chest tightness, subjective wheeze or overt sinus or hb symptoms.   Sleeping  without nocturnal  or early am exacerbation  of respiratory  c/o's or need for noct saba. Also denies any obvious fluctuation of symptoms with weather or environmental changes or other aggravating or alleviating factors except as outlined above   No unusual exposure hx or h/o childhood pna/ asthma or knowledge of premature birth.  Current Allergies, Complete Past Medical History, Past Surgical History, Family History, and Social History were reviewed in [employer] record.  ROS  The following are not active complaints unless bolded Hoarseness, sore throat, dysphagia, dental problems, itching, sneezing,  nasal congestion or discharge of excess mucus or purulent secretions, ear ache,   fever, chills, sweats, unintended wt loss or wt gain, classically pleuritic or exertional cp,  orthopnea pnd or arm/hand swelling  or leg swelling, presyncope, palpitations, abdominal pain, anorexia, nausea, vomiting, diarrhea  or change in bowel habits or change in bladder habits, change in stools or change in urine, dysuria, hematuria,  rash, arthralgias, visual complaints, headache, numbness, weakness or ataxia or problems with walking or coordination,  change in mood or  memory.        Current Meds  Medication Sig  . amiodarone (PACERONE) 200 MG tablet Take 200 mg by  mouth daily.   Marland Kitchen apixaban (ELIQUIS) 2.5 MG TABS tablet Take 2.5 mg by mouth 2 (two) times daily.   Marland Kitchen atorvastatin (LIPITOR) 20 MG tablet Take 20 mg by mouth at bedtime.   Marland Kitchen escitalopram (LEXAPRO) 10 MG tablet Take 10 mg  by mouth daily.  . folic acid (FOLVITE) 1 MG tablet Take 1 mg by mouth every morning.   . metoprolol tartrate (LOPRESSOR) 25 MG tablet Take 25 mg by mouth 2 (two) times daily as needed (does not take if BP is less than 55).  . nitroGLYCERIN (NITROSTAT) 0.4 MG SL tablet Place 0.4 mg under the tongue every 5 (five) minutes as needed for chest pain.   . pantoprazole (PROTONIX) 40 MG tablet Take 30- 60 min before your first and last meals of the day  . ticagrelor (BRILINTA) 90 MG TABS tablet Take 90 mg by mouth.   . Vitamin D, Ergocalciferol, (DRISDOL) 1.25 MG (50000 UT) CAPS capsule Take 1 capsule by mouth every Wednesday.                   Objective:  Physical Exam   12/06/2018   130  08/14/2014        138 >  04/24/2015   137> 08/08/2015  136 > 02/09/2016  132 >  11/15/2016    137 > 01/06/2017  133 > 01/05/2018   134 > 01/27/2018   133 >  07/12/2018  129    amb pr female nad   Vital signs reviewed - Note on arrival 02 sats  98% on RA    HEENT : pt wearing mask not removed for exam due to covid -19 concerns.    NECK :  without JVD/Nodes/TM/ nl carotid upstrokes bilaterally   LUNGS: no acc muscle use,  Nl contour chest which is clear to A and P bilaterally without cough on insp or exp maneuvers   CV:  RRR  no s3 or murmur or increase in P2, and no edema   ABD:  soft and nontender with nl inspiratory excursion in the supine position. No bruits or organomegaly appreciated, bowel sounds nl  MS:  Nl gait/ ext warm without deformities, calf tenderness, cyanosis or clubbing No obvious joint restrictions   SKIN: warm and dry without lesions    NEURO:  alert, approp, nl sensorium with  no motor or cerebellar deficits apparent.               Assessment & Plan:

## 2018-12-06 NOTE — Assessment & Plan Note (Addendum)
Onset decades prior to initial pulmonary eval 2014  - 10/18/2013  resume trial of dulera 100 2bid  -Med calendar 11/01/2013 > did not bring as instructed 12/04/13 - PFTs wnl 12/04/13 > ok to try off dulera - flare off dulera 08/14/14 > ok to resume dulera 100 2bid  - Spirometry 04/24/2015  wnl off dulera / even fef 25-75 - NO 04/24/2015  = 17  - 11/15/2016  try symbicort 80 2bid  - PFT's  01/06/2017  FEV1 1.59 (101 % ) ratio 86  p 4 % improvement from saba p symb 80 x 2 x 12h  prior to study with DLCO  67/68c % corrects to 100 % for alv volume   - 01/27/2018 cough resolved maint on just 1st gen H1 blockers per guidelines  And off inhalers  - 06/22/2018 flared off dulera > resume   - 07/12/2018   try BREO 100 one am for insurance purposes  - 12/06/2018  After extensive coaching inhaler device,  effectiveness =    90% with elipta > give breo 100 x 2 week sample then return with formulary to see NP/pharmacist for best choice.  As long as has access to BREO 100 >>>  All goals of chronic asthma control met including optimal function and elimination of symptoms with minimal need for rescue therapy.  Contingencies discussed in full including contacting this office immediately if not controlling the symptoms using the rule of two's.     Advised:  formulary restrictions will be an ongoing challenge for the forseable future and I would be happy to pick an alternative if the pt will first  provide me a list of them -  pt  will need to return here for training for any new device that is required eg dpi vs hfa vs respimat.    In the meantime we can always provide samples so that the patient never runs out of any needed respiratory medications.    I had an extended discussion with the patient reviewing all relevant studies completed to date and  lasting 15 to 20 minutes of a 25 minute visit    I performed detailed device teaching using a teach back method which extended face to face time for this visit (see  above)  Each maintenance medication was reviewed in detail including emphasizing most importantly the difference between maintenance and prns and under what circumstances the prns are to be triggered using an action plan format that is not reflected in the computer generated alphabetically organized AVS which I have not found useful in most complex patients, especially with respiratory illnesses  Please see AVS for specific instructions unique to this visit that I personally wrote and verbalized to the the pt in detail and then reviewed with pt  by my nurse highlighting any  changes in therapy recommended at today's visit to their plan of care.

## 2018-12-07 DIAGNOSIS — E782 Mixed hyperlipidemia: Secondary | ICD-10-CM | POA: Diagnosis not present

## 2018-12-07 DIAGNOSIS — Z955 Presence of coronary angioplasty implant and graft: Secondary | ICD-10-CM | POA: Diagnosis not present

## 2018-12-07 DIAGNOSIS — I4891 Unspecified atrial fibrillation: Secondary | ICD-10-CM | POA: Diagnosis not present

## 2018-12-07 DIAGNOSIS — M79604 Pain in right leg: Secondary | ICD-10-CM | POA: Diagnosis not present

## 2018-12-07 DIAGNOSIS — Z7901 Long term (current) use of anticoagulants: Secondary | ICD-10-CM | POA: Diagnosis not present

## 2018-12-07 DIAGNOSIS — M79605 Pain in left leg: Secondary | ICD-10-CM | POA: Diagnosis not present

## 2018-12-07 DIAGNOSIS — I10 Essential (primary) hypertension: Secondary | ICD-10-CM | POA: Diagnosis not present

## 2018-12-07 DIAGNOSIS — I5032 Chronic diastolic (congestive) heart failure: Secondary | ICD-10-CM | POA: Diagnosis not present

## 2018-12-08 ENCOUNTER — Other Ambulatory Visit: Payer: Self-pay

## 2018-12-08 DIAGNOSIS — Z20822 Contact with and (suspected) exposure to covid-19: Secondary | ICD-10-CM

## 2018-12-10 LAB — NOVEL CORONAVIRUS, NAA: SARS-CoV-2, NAA: NOT DETECTED

## 2018-12-11 ENCOUNTER — Encounter (HOSPITAL_COMMUNITY): Payer: Self-pay | Admitting: *Deleted

## 2018-12-13 ENCOUNTER — Ambulatory Visit (HOSPITAL_COMMUNITY): Payer: Medicare HMO | Admitting: Anesthesiology

## 2018-12-13 ENCOUNTER — Other Ambulatory Visit: Payer: Self-pay

## 2018-12-13 ENCOUNTER — Encounter (HOSPITAL_COMMUNITY): Payer: Self-pay

## 2018-12-13 ENCOUNTER — Ambulatory Visit (HOSPITAL_COMMUNITY)
Admission: RE | Admit: 2018-12-13 | Discharge: 2018-12-13 | Disposition: A | Discharge status: Home / Self Care | Payer: Medicare HMO | Attending: Gastroenterology | Admitting: Gastroenterology

## 2018-12-13 ENCOUNTER — Encounter (HOSPITAL_COMMUNITY)
Admission: RE | Disposition: A | Discharge status: Home / Self Care | Payer: Self-pay | Source: Home / Self Care | Attending: Gastroenterology

## 2018-12-13 DIAGNOSIS — K2980 Duodenitis without bleeding: Secondary | ICD-10-CM | POA: Insufficient documentation

## 2018-12-13 DIAGNOSIS — E119 Type 2 diabetes mellitus without complications: Secondary | ICD-10-CM | POA: Insufficient documentation

## 2018-12-13 DIAGNOSIS — R634 Abnormal weight loss: Secondary | ICD-10-CM | POA: Diagnosis not present

## 2018-12-13 DIAGNOSIS — Z7901 Long term (current) use of anticoagulants: Secondary | ICD-10-CM | POA: Diagnosis not present

## 2018-12-13 DIAGNOSIS — Z6823 Body mass index (BMI) 23.0-23.9, adult: Secondary | ICD-10-CM | POA: Insufficient documentation

## 2018-12-13 DIAGNOSIS — D124 Benign neoplasm of descending colon: Secondary | ICD-10-CM | POA: Diagnosis not present

## 2018-12-13 DIAGNOSIS — J45991 Cough variant asthma: Secondary | ICD-10-CM | POA: Diagnosis not present

## 2018-12-13 DIAGNOSIS — I10 Essential (primary) hypertension: Secondary | ICD-10-CM | POA: Insufficient documentation

## 2018-12-13 DIAGNOSIS — K317 Polyp of stomach and duodenum: Secondary | ICD-10-CM | POA: Diagnosis not present

## 2018-12-13 DIAGNOSIS — I4891 Unspecified atrial fibrillation: Secondary | ICD-10-CM | POA: Diagnosis not present

## 2018-12-13 DIAGNOSIS — C779 Secondary and unspecified malignant neoplasm of lymph node, unspecified: Secondary | ICD-10-CM | POA: Diagnosis not present

## 2018-12-13 DIAGNOSIS — K635 Polyp of colon: Secondary | ICD-10-CM | POA: Diagnosis not present

## 2018-12-13 DIAGNOSIS — M1990 Unspecified osteoarthritis, unspecified site: Secondary | ICD-10-CM | POA: Diagnosis not present

## 2018-12-13 DIAGNOSIS — C801 Malignant (primary) neoplasm, unspecified: Secondary | ICD-10-CM | POA: Diagnosis not present

## 2018-12-13 HISTORY — PX: POLYPECTOMY: SHX5525

## 2018-12-13 HISTORY — PX: COLONOSCOPY WITH PROPOFOL: SHX5780

## 2018-12-13 HISTORY — PX: BIOPSY: SHX5522

## 2018-12-13 HISTORY — PX: ESOPHAGOGASTRODUODENOSCOPY: SHX5428

## 2018-12-13 SURGERY — COLONOSCOPY WITH PROPOFOL
Anesthesia: Monitor Anesthesia Care

## 2018-12-13 MED ORDER — LACTATED RINGERS IV SOLN
INTRAVENOUS | Status: DC | PRN
  Administered 2018-12-13: 13:00:00 via INTRAVENOUS

## 2018-12-13 MED ORDER — PROPOFOL 500 MG/50ML IV EMUL
INTRAVENOUS | Status: DC | PRN
  Administered 2018-12-13: 100 ug/kg/min via INTRAVENOUS

## 2018-12-13 MED ORDER — SODIUM CHLORIDE 0.9 % IV SOLN: INTRAVENOUS | Status: DC

## 2018-12-13 MED ORDER — LACTATED RINGERS IV SOLN
INTRAVENOUS | Status: AC | PRN
  Administered 2018-12-13: 1000 mL via INTRAVENOUS

## 2018-12-13 MED ORDER — LIDOCAINE HCL (CARDIAC) PF 100 MG/5ML IV SOSY
PREFILLED_SYRINGE | INTRAVENOUS | Status: DC | PRN
  Administered 2018-12-13: 75 mg via INTRAVENOUS

## 2018-12-13 MED ORDER — GLYCOPYRROLATE 0.2 MG/ML IJ SOLN
INTRAMUSCULAR | Status: DC | PRN
  Administered 2018-12-13: 0.1 mg via INTRAVENOUS

## 2018-12-13 MED ORDER — PROPOFOL 10 MG/ML IV BOLUS
INTRAVENOUS | Status: DC | PRN
  Administered 2018-12-13: 30 mg via INTRAVENOUS

## 2018-12-13 SURGICAL SUPPLY — 21 items

## 2018-12-13 NOTE — Transfer of Care (Signed)
Immediate Anesthesia Transfer of Care Note  Patient: Lorraine Gilbert  Procedure(s) Performed: COLONOSCOPY WITH PROPOFOL (N/A ) ESOPHAGOGASTRODUODENOSCOPY (EGD) (N/A ) BIOPSY POLYPECTOMY  Patient Location: PACU  Anesthesia Type:MAC  Level of Consciousness: awake, alert , oriented and patient cooperative  Airway & Oxygen Therapy: Patient Spontanous Breathing and Patient connected to face mask oxygen  Post-op Assessment: Report given to RN, Post -op Vital signs reviewed and stable and Patient moving all extremities  Post vital signs: Reviewed and stable  Last Vitals:  Vitals Value Taken Time  BP    Temp    Pulse 59 12/13/18 1357  Resp 18 12/13/18 1357  SpO2 100 % 12/13/18 1357  Vitals shown include unvalidated device data.  Last Pain:  Vitals:   12/13/18 1225  TempSrc: Oral  PainSc: 0-No pain         Complications: No apparent anesthesia complications

## 2018-12-13 NOTE — Op Note (Signed)
[facility] Patient Name: Lorraine Gilbert Procedure Date: 12/13/2018 MRN: [id number] Attending MD: Wonda Horner , MD Date of Birth: 1932/11/08 CSN: [id number] Age: 85 Admit Type: Outpatient Procedure:                Upper GI endoscopy Indications:              Weight loss Providers:                Wonda Horner, MD, Carlyn Reichert, RN, Elspeth Cho Tech., Technician, Enrigue Catena, CRNA Referring MD:              Medicines:                Propofol per Anesthesia Complications:            No immediate complications. Estimated Blood Loss:     Estimated blood loss was minimal. Procedure:                Pre-Anesthesia Assessment:                           - Prior to the procedure, a History and Physical                            was performed, and patient medications and                            allergies were reviewed. The patient's tolerance of                            previous anesthesia was also reviewed. The risks                            and benefits of the procedure and the sedation                            options and risks were discussed with the patient.                            All questions were answered, and informed consent                            was obtained. Prior Anticoagulants: The patient has                            taken Eliquis (apixaban), and Brilinta last dose                            was 3 days prior to procedure. [ASA 3. After                            reviewing the risks and benefits, the patient was  deemed in satisfactory condition to undergo the                            procedure.                           After obtaining informed consent, the endoscope was                            passed under direct vision. Throughout the                            procedure, the patient's blood pressure, pulse, and                            oxygen saturations were monitored  continuously. The                            [id number] Olympus gastroscope was                            introduced through the mouth, and advanced to the                            second part of duodenum. The upper GI endoscopy was                            accomplished without difficulty. The patient                            tolerated the procedure well. Scope In: Scope Out: Findings:      The examined esophagus was normal.      The entire examined stomach was normal.      A single 3 mm sessile polyp was found in the second portion of the       duodenum. The polyp was removed with a cold biopsy forceps. Resection       and retrieval were complete. Impression:               - Normal esophagus.                           - Normal stomach.                           - A single duodenal polyp. Resected and retrieved. Moderate Sedation:      . Recommendation:           - Resume regular diet.                           - Continue present medications.                           - Await pathology results. Procedure Code(s):        --- Professional ---  [id number], Esophagogastroduodenoscopy, flexible,                            transoral; with biopsy, single or multiple Diagnosis Code(s):        --- Professional ---                           K31.7, Polyp of stomach and duodenum                           R63.4, Abnormal weight loss CPT copyright 2019 American Medical Association. All rights reserved. The codes documented in this report are preliminary and upon coder review may  be revised to meet current compliance requirements. Wonda Horner, MD 12/13/2018 2:07:31 PM This report has been signed electronically. Number of Addenda: 0

## 2018-12-13 NOTE — Op Note (Signed)
[facility] Patient Name: Lorraine Gilbert Procedure Date: 12/13/2018 MRN: [id number] Attending MD: Wonda Horner , MD Date of Birth: Apr 06, 1932 CSN: [id number] Age: 85 Admit Type: Outpatient Procedure:                Colonoscopy Indications:              Weight loss Providers:                Wonda Horner, MD, Carlyn Reichert, RN, Elspeth Cho Tech., Technician, Enrigue Catena, CRNA Referring MD:              Medicines:                 Complications:            No immediate complications. Estimated Blood Loss:     Estimated blood loss was minimal. Procedure:                Pre-Anesthesia Assessment:                           - Prior to the procedure, a History and Physical                            was performed, and patient medications and                            allergies were reviewed. The patient's tolerance of                            previous anesthesia was also reviewed. The risks                            and benefits of the procedure and the sedation                            options and risks were discussed with the patient.                            All questions were answered, and informed consent                            was obtained. Prior Anticoagulants: The patient has                            taken Eliquis (apixaban), last dose was 3 days                            prior to procedure. ASA Grade Assessment: III - A                            patient with severe systemic disease. After  reviewing the risks and benefits, the patient was                            deemed in satisfactory condition to undergo the                            procedure.                           - Prior to the procedure, a History and Physical                            was performed, and patient medications and                            allergies were reviewed. The patient's tolerance of     previous anesthesia was also reviewed. The risks                            and benefits of the procedure and the sedation                            options and risks were discussed with the patient.                            All questions were answered, and informed consent                            was obtained. Prior Anticoagulants: The patient has                            taken Eliquis (apixaban), and Brilinta last dose                            was 3 days prior to procedure. ASA Grade                            Assessment: III - A patient with severe systemic                            disease. After reviewing the risks and benefits,                            the patient was deemed in satisfactory condition to                            undergo the procedure.                           After obtaining informed consent, the colonoscope                            was passed under direct vision. Throughout the  procedure, the patient's blood pressure, pulse, and                            oxygen saturations were monitored continuously. The                            [id number]-[id number] Olympus pediatric colonscope                            was introduced through the anus and advanced to the                            the cecum, identified by appendiceal orifice and                            ileocecal valve. The ileocecal valve, appendiceal                            orifice, and rectum were photographed. The                            colonoscopy was performed without difficulty. The                            patient tolerated the procedure well. The quality                            of the bowel preparation was good. Scope In: 1:35:42 PM Scope Out: 1:49:10 PM Scope Withdrawal Time: 0 hours 5 minutes 58 seconds  Total Procedure Duration: 0 hours 13 minutes 28 seconds  Findings:      The perianal and digital rectal examinations were normal.      A  3 mm polyp was found in the descending colon. The polyp was sessile.       The polyp was removed with a cold biopsy forceps. Resection and       retrieval were complete.      The exam was otherwise without abnormality. Impression:               - One 3 mm polyp in the descending colon, removed                            with a cold biopsy forceps. Resected and retrieved.                           - The examination was otherwise normal. Moderate Sedation:      . Recommendation:           - Resume regular diet.                           - Continue present medications.                           - Await pathology results.                           -  Repeat colonoscopy is not recommended for                            surveillance. Procedure Code(s):        --- Professional ---                           [phone], Colonoscopy, flexible; with biopsy, single                            or multiple Diagnosis Code(s):        --- Professional ---                           K63.5, Polyp of colon CPT copyright 2019 American Medical Association. All rights reserved. The codes documented in this report are preliminary and upon coder review may  be revised to meet current compliance requirements. Wonda Horner, MD 12/13/2018 2:11:11 PM This report has been signed electronically. Number of Addenda: 0

## 2018-12-13 NOTE — Discharge Instructions (Signed)

## 2018-12-13 NOTE — Anesthesia Procedure Notes (Signed)
Procedure Name: MAC Date/Time: 12/13/2018 1:16 PM Performed by: Lissa Morales, CRNA Pre-anesthesia Checklist: Patient identified, Emergency Drugs available, Suction available, Patient being monitored and Timeout performed Patient Re-evaluated:Patient Re-evaluated prior to induction Oxygen Delivery Method: Simple face mask Placement Confirmation: positive ETCO2

## 2018-12-13 NOTE — H&P (Signed)
The patient presents to the endoscopy unit today for EGD and colonoscopy to evaluate for weight loss.  Prior history reviewed.  No change in H&P.  She held her Brilinta and Eliquis 3 days ago.  Physical alert  No distress  Heart regular rhythm no murmurs  Lungs clear  Abdomen soft and nontender  Impression weight loss  Plan EGD and colonoscopy

## 2018-12-13 NOTE — Anesthesia Preprocedure Evaluation (Signed)
Anesthesia Evaluation  Patient identified by MRN, date of birth, ID band Patient awake    Reviewed: Allergy & Precautions, NPO status , Patient's Chart, lab work & pertinent test results  Airway Mallampati: II  TM Distance: >3 FB Neck ROM: Full    Dental no notable dental hx.    Pulmonary neg pulmonary ROS,    Pulmonary exam normal breath sounds clear to auscultation       Cardiovascular hypertension, + dysrhythmias Atrial Fibrillation  Rhythm:Irregular Rate:Normal     Neuro/Psych negative neurological ROS  negative psych ROS   GI/Hepatic negative GI ROS, Neg liver ROS,   Endo/Other  diabetes  Renal/GU negative Renal ROS  negative genitourinary   Musculoskeletal  (+) Arthritis , Rheumatoid disorders,    Abdominal   Peds negative pediatric ROS (+)  Hematology negative hematology ROS (+)   Anesthesia Other Findings   Reproductive/Obstetrics negative OB ROS                             Anesthesia Physical Anesthesia Plan  ASA: III  Anesthesia Plan: MAC   Post-op Pain Management:    Induction: Intravenous  PONV Risk Score and Plan: 0  Airway Management Planned: Simple Face Mask  Additional Equipment:   Intra-op Plan:   Post-operative Plan:   Informed Consent: I have reviewed the patients History and Physical, chart, labs and discussed the procedure including the risks, benefits and alternatives for the proposed anesthesia with the patient or authorized representative who has indicated his/her understanding and acceptance.     Dental advisory given  Plan Discussed with: CRNA and Surgeon  Anesthesia Plan Comments:         Anesthesia Quick Evaluation

## 2018-12-15 ENCOUNTER — Encounter (HOSPITAL_COMMUNITY): Payer: Self-pay | Admitting: Gastroenterology

## 2018-12-15 LAB — SURGICAL PATHOLOGY

## 2018-12-16 NOTE — Anesthesia Postprocedure Evaluation (Signed)
Anesthesia Post Note  Patient: Lorraine Gilbert  Procedure(s) Performed: COLONOSCOPY WITH PROPOFOL (N/A ) ESOPHAGOGASTRODUODENOSCOPY (EGD) (N/A ) BIOPSY POLYPECTOMY     Patient location during evaluation: PACU Anesthesia Type: MAC Level of consciousness: awake and alert Pain management: pain level controlled Vital Signs Assessment: post-procedure vital signs reviewed and stable Respiratory status: spontaneous breathing, nonlabored ventilation, respiratory function stable and patient connected to nasal cannula oxygen Cardiovascular status: stable and blood pressure returned to baseline Postop Assessment: no apparent nausea or vomiting Anesthetic complications: no    Last Vitals:  Vitals:   12/13/18 1357 12/13/18 1410  BP: (!) 137/39 (!) 149/46  Pulse:  (!) 56  Resp:  13  Temp: (!) 36.3 C   SpO2:  99%    Last Pain:  Vitals:   12/13/18 1410  TempSrc:   PainSc: 0-No pain                 Jumanah Hynson S

## 2019-01-17 DIAGNOSIS — Z79899 Other long term (current) drug therapy: Secondary | ICD-10-CM | POA: Diagnosis not present

## 2019-01-17 DIAGNOSIS — M79605 Pain in left leg: Secondary | ICD-10-CM | POA: Diagnosis not present

## 2019-01-17 DIAGNOSIS — R203 Hyperesthesia: Secondary | ICD-10-CM | POA: Diagnosis not present

## 2019-01-17 DIAGNOSIS — M353 Polymyalgia rheumatica: Secondary | ICD-10-CM | POA: Diagnosis not present

## 2019-01-17 DIAGNOSIS — Z6823 Body mass index (BMI) 23.0-23.9, adult: Secondary | ICD-10-CM | POA: Diagnosis not present

## 2019-01-17 DIAGNOSIS — M5431 Sciatica, right side: Secondary | ICD-10-CM | POA: Diagnosis not present

## 2019-01-17 DIAGNOSIS — M159 Polyosteoarthritis, unspecified: Secondary | ICD-10-CM | POA: Diagnosis not present

## 2019-01-17 DIAGNOSIS — M0579 Rheumatoid arthritis with rheumatoid factor of multiple sites without organ or systems involvement: Secondary | ICD-10-CM | POA: Diagnosis not present

## 2019-01-17 DIAGNOSIS — M2550 Pain in unspecified joint: Secondary | ICD-10-CM | POA: Diagnosis not present

## 2019-01-18 DIAGNOSIS — Z79899 Other long term (current) drug therapy: Secondary | ICD-10-CM | POA: Diagnosis not present

## 2019-01-18 DIAGNOSIS — I4891 Unspecified atrial fibrillation: Secondary | ICD-10-CM | POA: Diagnosis not present

## 2019-01-23 DIAGNOSIS — H401131 Primary open-angle glaucoma, bilateral, mild stage: Secondary | ICD-10-CM | POA: Diagnosis not present

## 2019-01-23 DIAGNOSIS — Z961 Presence of intraocular lens: Secondary | ICD-10-CM | POA: Diagnosis not present

## 2019-01-23 DIAGNOSIS — E119 Type 2 diabetes mellitus without complications: Secondary | ICD-10-CM | POA: Diagnosis not present

## 2019-01-23 DIAGNOSIS — H353131 Nonexudative age-related macular degeneration, bilateral, early dry stage: Secondary | ICD-10-CM | POA: Diagnosis not present

## 2019-01-23 DIAGNOSIS — H10413 Chronic giant papillary conjunctivitis, bilateral: Secondary | ICD-10-CM | POA: Diagnosis not present

## 2019-01-23 DIAGNOSIS — H04123 Dry eye syndrome of bilateral lacrimal glands: Secondary | ICD-10-CM | POA: Diagnosis not present

## 2019-01-30 DIAGNOSIS — M5416 Radiculopathy, lumbar region: Secondary | ICD-10-CM | POA: Diagnosis not present

## 2019-01-30 DIAGNOSIS — M5136 Other intervertebral disc degeneration, lumbar region: Secondary | ICD-10-CM | POA: Diagnosis not present

## 2019-02-12 DIAGNOSIS — I739 Peripheral vascular disease, unspecified: Secondary | ICD-10-CM | POA: Diagnosis not present

## 2019-02-12 DIAGNOSIS — C801 Malignant (primary) neoplasm, unspecified: Secondary | ICD-10-CM | POA: Diagnosis not present

## 2019-02-12 DIAGNOSIS — K219 Gastro-esophageal reflux disease without esophagitis: Secondary | ICD-10-CM | POA: Diagnosis not present

## 2019-02-12 DIAGNOSIS — M791 Myalgia, unspecified site: Secondary | ICD-10-CM | POA: Diagnosis not present

## 2019-02-12 DIAGNOSIS — I4891 Unspecified atrial fibrillation: Secondary | ICD-10-CM | POA: Diagnosis not present

## 2019-02-12 DIAGNOSIS — I2510 Atherosclerotic heart disease of native coronary artery without angina pectoris: Secondary | ICD-10-CM | POA: Diagnosis not present

## 2019-02-12 DIAGNOSIS — F0390 Unspecified dementia without behavioral disturbance: Secondary | ICD-10-CM | POA: Diagnosis not present

## 2019-02-12 DIAGNOSIS — J45991 Cough variant asthma: Secondary | ICD-10-CM | POA: Diagnosis not present

## 2019-02-12 DIAGNOSIS — I700 Atherosclerosis of aorta: Secondary | ICD-10-CM | POA: Diagnosis not present

## 2019-02-12 DIAGNOSIS — M069 Rheumatoid arthritis, unspecified: Secondary | ICD-10-CM | POA: Diagnosis not present

## 2019-02-12 DIAGNOSIS — C779 Secondary and unspecified malignant neoplasm of lymph node, unspecified: Secondary | ICD-10-CM | POA: Diagnosis not present

## 2019-02-12 DIAGNOSIS — E1169 Type 2 diabetes mellitus with other specified complication: Secondary | ICD-10-CM | POA: Diagnosis not present

## 2019-03-01 ENCOUNTER — Encounter: Payer: Self-pay | Admitting: Radiation Oncology

## 2019-03-01 ENCOUNTER — Other Ambulatory Visit: Payer: Self-pay

## 2019-03-01 ENCOUNTER — Ambulatory Visit
Admission: RE | Admit: 2019-03-01 | Discharge: 2019-03-01 | Disposition: A | Discharge status: Home / Self Care | Payer: Medicare HMO | Source: Ambulatory Visit | Attending: Radiation Oncology | Admitting: Radiation Oncology

## 2019-03-01 VITALS — BP 160/51 | HR 60 | Temp 98.0°F | Resp 18 | Wt 135.2 lb

## 2019-03-01 DIAGNOSIS — Z923 Personal history of irradiation: Secondary | ICD-10-CM | POA: Diagnosis not present

## 2019-03-01 DIAGNOSIS — Z7901 Long term (current) use of anticoagulants: Secondary | ICD-10-CM | POA: Insufficient documentation

## 2019-03-01 DIAGNOSIS — C779 Secondary and unspecified malignant neoplasm of lymph node, unspecified: Secondary | ICD-10-CM

## 2019-03-01 DIAGNOSIS — C801 Malignant (primary) neoplasm, unspecified: Secondary | ICD-10-CM | POA: Diagnosis not present

## 2019-03-01 DIAGNOSIS — K2980 Duodenitis without bleeding: Secondary | ICD-10-CM | POA: Insufficient documentation

## 2019-03-01 DIAGNOSIS — Z08 Encounter for follow-up examination after completed treatment for malignant neoplasm: Secondary | ICD-10-CM | POA: Diagnosis not present

## 2019-03-01 DIAGNOSIS — Z7982 Long term (current) use of aspirin: Secondary | ICD-10-CM | POA: Diagnosis not present

## 2019-03-01 DIAGNOSIS — Z8589 Personal history of malignant neoplasm of other organs and systems: Secondary | ICD-10-CM | POA: Diagnosis not present

## 2019-03-01 DIAGNOSIS — C774 Secondary and unspecified malignant neoplasm of inguinal and lower limb lymph nodes: Secondary | ICD-10-CM | POA: Diagnosis not present

## 2019-03-01 DIAGNOSIS — Z79899 Other long term (current) drug therapy: Secondary | ICD-10-CM | POA: Diagnosis not present

## 2019-03-01 NOTE — Patient Instructions (Signed)
Coronavirus (COVID-19) Are you at risk?  Are you at risk for the Coronavirus (COVID-19)?  To be considered HIGH RISK for Coronavirus (COVID-19), you have to meet the following criteria:  . Traveled to [place], [place], [place], [place] or [place]; or in the [place] to [place], [place], [place], or [place]; and have fever, cough, and shortness of breath within the last 2 weeks of travel OR . Been in close contact with a person diagnosed with COVID-19 within the last 2 weeks and have fever, cough, and shortness of breath . IF YOU DO NOT MEET THESE CRITERIA, YOU ARE CONSIDERED LOW RISK FOR COVID-19.  What to do if you are HIGH RISK for COVID-19?  . If you are having a medical emergency, call 911. . Seek medical care right away. Before you go to a doctor's office, urgent care or emergency department, call ahead and tell them about your recent travel, contact with someone diagnosed with COVID-19, and your symptoms. You should receive instructions from your physician's office regarding next steps of care.  . When you arrive at healthcare provider, tell the healthcare staff immediately you have returned from visiting [place], [place], [place], [place] or [place]; or traveled in the [place] to [place], [place], [place], or [place]; in the last two weeks or you have been in close contact with a person diagnosed with COVID-19 in the last 2 weeks.   . Tell the health care staff about your symptoms: fever, cough and shortness of breath. . After you have been seen by a medical provider, you will be either: o Tested for (COVID-19) and discharged home on quarantine except to seek medical care if symptoms worsen, and asked to  - Stay home and avoid contact with others until you get your results (4-5 days)  - Avoid travel on public transportation if possible (such as bus, train, or airplane) or o Sent to the Emergency Department by EMS for evaluation, COVID-19 testing, and possible  admission depending on your condition and test results.  What to do if you are LOW RISK for COVID-19?  Reduce your risk of any infection by using the same precautions used for avoiding the common cold or flu:  . Wash your hands often with soap and warm water for at least 20 seconds.  If soap and water are not readily available, use an alcohol-based hand sanitizer with at least 60% alcohol.  . If coughing or sneezing, cover your mouth and nose by coughing or sneezing into the elbow areas of your shirt or coat, into a tissue or into your sleeve (not your hands). . Avoid shaking hands with others and consider head nods or verbal greetings only. . Avoid touching your eyes, nose, or mouth with unwashed hands.  . Avoid close contact with people who are sick. . Avoid places or events with large numbers of people in one location, like concerts or sporting events. . Carefully consider travel plans you have or are making. . If you are planning any travel outside or inside the [place], visit the CDC's Travelers' Health webpage for the latest health notices. . If you have some symptoms but not all symptoms, continue to monitor at home and seek medical attention if your symptoms worsen. . If you are having a medical emergency, call 911.   ADDITIONAL HEALTHCARE OPTIONS FOR PATIENTS   Telehealth / e-Visit: [email or web address]         [facility]: [phone]  [facility]: [phone]                   [facility]: [phone]   

## 2019-03-01 NOTE — Progress Notes (Signed)
Pt presents today for f/u with Dr. Sondra Come. Pt is accompanied by daughter in law. Pt reports multiple falls recently because "my knees give out when I start walking". Pt reports "all over" arthritic pain, rated 10/10. Pt reports skin in left groin is WNL and pt reports not palpating any swelling or mass.   BP (!) 160/51 (BP Location: Left Arm, Patient Position: Sitting, Cuff Size: Normal)   Pulse 60   Temp 98 F (36.7 C)   Resp 18   Wt 135 lb 3.2 oz (61.3 kg)   SpO2 100%   BMI 23.95 kg/m   Wt Readings from Last 3 Encounters:  03/01/19 135 lb 3.2 oz (61.3 kg)  12/06/18 130 lb (59 kg)  11/23/18 129 lb (58.5 kg)   Loma Sousa, RN BSN

## 2019-03-01 NOTE — Progress Notes (Signed)
Radiation Oncology         ([phone]) [phone] ________________________________  Name: Lorraine Gilbert MRN: [id number]  Date: 03/01/2019  DOB: July 20, 1932  Follow-Up Visit Note  CC: [name], MD  [name], MD    ICD-10-CM   1. Metastatic squamous cell carcinoma involving lymph node with unknown primary site [facility])  C77.9    C80.1     Diagnosis:   86 y.o. female with Metastatic squamous cell carcinomapresenting in the left inguinal area (unknown primary site)  Interval Since Last Radiation:  7 months, 2 weeks 07/05/2018 - 07/18/2018: Left Inguinal / 30 Gy in 10 fractions  Narrative:  The patient returns today for routine follow-up. Since her last visit, she underwent colonoscopy on 12/13/2018, with pathology showing a tubular adenoma and peptic duodenitis.  On review of systems, she reports recent falls according to the patient and her daughter she has not fractured any bones.  She is scheduled for a chest CT scan tomorrow to follow-up on her chronic cough ordered by pulmonary medicine.  ALLERGIES:  is allergic to tylenol with codeine #3 [acetaminophen-codeine]; ibuprofen; and morphine.  Meds: Current Outpatient Medications  Medication Sig Dispense Refill  . amiodarone (PACERONE) 200 MG tablet Take 200 mg by mouth daily.     Marland Kitchen apixaban (ELIQUIS) 2.5 MG TABS tablet Take 2.5 mg by mouth 2 (two) times daily.     Marland Kitchen aspirin EC 81 MG tablet Take 81 mg by mouth 2 (two) times daily. STARTED 12-10-2018    . atorvastatin (LIPITOR) 20 MG tablet Take 20 mg by mouth at bedtime.     Marland Kitchen diltiazem (TIAZAC) 180 MG 24 hr capsule diltiazem CD 180 mg capsule,extended release 24 hr  TAKE 1 CAPSULE BY MOUTH ONCE DAILY    . escitalopram (LEXAPRO) 10 MG tablet Take 10 mg by mouth daily.    . fluticasone furoate-vilanterol (BREO ELLIPTA) 100-25 MCG/INH AEPB Inhale 1 puff into the lungs daily. (Patient taking differently: Inhale 1 puff into the lungs as needed. ) 14 each 0  . folic acid (FOLVITE) 1 MG  tablet Take 1 mg by mouth every morning.     . methotrexate (RHEUMATREX) 2.5 MG tablet methotrexate sodium 2.5 mg tablet  TAKE 6 TABLETS BY MOUTH ONCE A WEEK    . metoprolol tartrate (LOPRESSOR) 25 MG tablet Take 25 mg by mouth 2 (two) times daily as needed (does not take if BP is less than 55).    . nitroGLYCERIN (NITROSTAT) 0.4 MG SL tablet Place 0.4 mg under the tongue every 5 (five) minutes as needed for chest pain.     . pantoprazole (PROTONIX) 40 MG tablet Take 30- 60 min before your first and last meals of the day 60 tablet 2  . ticagrelor (BRILINTA) 90 MG TABS tablet Take 90 mg by mouth.     . Vitamin D, Ergocalciferol, (DRISDOL) 1.25 MG (50000 UT) CAPS capsule Take 1 capsule by mouth every Wednesday.     No current facility-administered medications for this encounter.    Physical Findings: The patient is in no acute distress. Patient is alert and oriented.  weight is 135 lb 3.2 oz (61.3 kg). Her temperature is 98 F (36.7 C). Her blood pressure is 160/51 (abnormal) and her pulse is 60. Her respiration is 18 and oxygen saturation is 100%.   Lungs are clear to auscultation bilaterally. Heart has regular rate and rhythm. No palpable cervical, supraclavicular, or axillary adenopathy. Abdomen soft, non-tender, normal bowel sounds.   Examination of the inguinal  areas reveals no palpable adenopathy.  No appreciable radiation changes noted in the left inguinal area.  Lab Findings: Lab Results  Component Value Date   WBC 9.4 07/28/2017   HGB 13.6 07/28/2017   HCT 43.6 07/28/2017   MCV 94.6 07/28/2017   PLT 256 07/28/2017    Radiographic Findings: No results found.  Impression:  Metastatic squamous cell carcinomapresenting in the left inguinal area (unknown primary site).   No evidence of recurrence on clinical exam today.  We will hold off on any imaging of the pelvis and abdomen since she is doing well.  Plan: Routine follow-up in 3 months.     ____________________________________  Blair Promise, PhD, MD  This document serves as a record of services personally performed by Gery Pray, MD. It was created on his behalf by Wilburn Mylar, a trained medical scribe. The creation of this record is based on the scribe's personal observations and the provider's statements to them. This document has been checked and approved by the attending provider.

## 2019-03-02 ENCOUNTER — Ambulatory Visit
Admission: RE | Admit: 2019-03-02 | Discharge: 2019-03-02 | Disposition: A | Discharge status: Home / Self Care | Payer: Medicare HMO | Source: Ambulatory Visit | Attending: Primary Care | Admitting: Primary Care

## 2019-03-02 DIAGNOSIS — R918 Other nonspecific abnormal finding of lung field: Secondary | ICD-10-CM

## 2019-03-02 MED ORDER — IOPAMIDOL (ISOVUE-300) INJECTION 61%
75.0000 mL | Freq: Once | INTRAVENOUS | Status: AC | PRN
  Administered 2019-03-02: 75 mL via INTRAVENOUS

## 2019-03-05 NOTE — Progress Notes (Signed)
Left upper lobe nodules no longer visible. Scattered ground glass bilaterally, suggestive of inflammation or infection.   Does she have any active upper respiratory symptoms or did she recently?

## 2019-03-06 NOTE — Progress Notes (Signed)
I spoke with patient, she is feeling in normal health. Developed allergy "itchy/dry" cough yesterday. Occurs this time of year annually. She has mild dyspnea with household activities which is her baseline and no change. Denies fever, chills, shortness of breath or mucus production. Keep follow-up in May, sooner if she develops acute symptoms.

## 2019-03-10 DIAGNOSIS — M5416 Radiculopathy, lumbar region: Secondary | ICD-10-CM | POA: Diagnosis not present

## 2019-03-19 DIAGNOSIS — M0579 Rheumatoid arthritis with rheumatoid factor of multiple sites without organ or systems involvement: Secondary | ICD-10-CM | POA: Diagnosis not present

## 2019-03-19 DIAGNOSIS — M159 Polyosteoarthritis, unspecified: Secondary | ICD-10-CM | POA: Diagnosis not present

## 2019-03-19 DIAGNOSIS — Z79899 Other long term (current) drug therapy: Secondary | ICD-10-CM | POA: Diagnosis not present

## 2019-03-19 DIAGNOSIS — M2550 Pain in unspecified joint: Secondary | ICD-10-CM | POA: Diagnosis not present

## 2019-03-19 DIAGNOSIS — Z6823 Body mass index (BMI) 23.0-23.9, adult: Secondary | ICD-10-CM | POA: Diagnosis not present

## 2019-03-19 DIAGNOSIS — M5431 Sciatica, right side: Secondary | ICD-10-CM | POA: Diagnosis not present

## 2019-03-19 DIAGNOSIS — R203 Hyperesthesia: Secondary | ICD-10-CM | POA: Diagnosis not present

## 2019-03-19 DIAGNOSIS — M353 Polymyalgia rheumatica: Secondary | ICD-10-CM | POA: Diagnosis not present

## 2019-03-19 DIAGNOSIS — M79605 Pain in left leg: Secondary | ICD-10-CM | POA: Diagnosis not present

## 2019-03-23 DIAGNOSIS — M5416 Radiculopathy, lumbar region: Secondary | ICD-10-CM | POA: Diagnosis not present

## 2019-04-06 ENCOUNTER — Ambulatory Visit: Payer: Medicare HMO | Admitting: Neurology

## 2019-04-06 ENCOUNTER — Encounter: Payer: Self-pay | Admitting: Neurology

## 2019-04-06 ENCOUNTER — Other Ambulatory Visit: Payer: Self-pay

## 2019-04-06 VITALS — BP 173/60 | HR 68 | Ht 63.0 in | Wt 134.0 lb

## 2019-04-06 DIAGNOSIS — R42 Dizziness and giddiness: Secondary | ICD-10-CM

## 2019-04-06 DIAGNOSIS — F03A0 Unspecified dementia, mild, without behavioral disturbance, psychotic disturbance, mood disturbance, and anxiety: Secondary | ICD-10-CM

## 2019-04-06 DIAGNOSIS — F0390 Unspecified dementia without behavioral disturbance: Secondary | ICD-10-CM | POA: Diagnosis not present

## 2019-04-06 NOTE — Patient Instructions (Signed)
Looking great! Continue all your medications. Follow-up with Dr. Nelva Bush for pain, agree with massage therapy. Follow-up in 8 months, call for any changes.  FALL PRECAUTIONS: Be cautious when walking. Scan the area for obstacles that may increase the risk of trips and falls. When getting up in the mornings, sit up at the edge of the bed for a few minutes before getting out of bed. Consider elevating the bed at the head end to avoid drop of blood pressure when getting up. Walk always in a well-lit room (use night lights in the walls). Avoid area rugs or power cords from appliances in the middle of the walkways. Use a walker or a cane if necessary and consider physical therapy for balance exercise. Get your eyesight checked regularly.  FINANCIAL OVERSIGHT: Supervision, especially oversight when making financial decisions or transactions is also recommended.  HOME SAFETY: Consider the safety of the kitchen when operating appliances like stoves, microwave oven, and blender. Consider having supervision and share cooking responsibilities until no longer able to participate in those. Accidents with firearms and other hazards in the house should be identified and addressed as well.  ABILITY TO BE LEFT ALONE: If patient is unable to contact 911 operator, consider using LifeLine, or when the need is there, arrange for someone to stay with patients. Smoking is a fire hazard, consider supervision or cessation. Risk of wandering should be assessed by caregiver and if detected at any point, supervision and safe proof recommendations should be instituted.  MEDICATION SUPERVISION: Inability to self-administer medication needs to be constantly addressed. Implement a mechanism to ensure safe administration of the medications.  RECOMMENDATIONS FOR ALL PATIENTS WITH MEMORY PROBLEMS: 1. Continue to exercise (Recommend 30 minutes of walking everyday, or 3 hours every week) 2. Increase social interactions - continue going to  [place] and enjoy social gatherings with friends and family 3. Eat healthy, avoid fried foods and eat more fruits and vegetables 4. Maintain adequate blood pressure, blood sugar, and blood cholesterol level. Reducing the risk of stroke and cardiovascular disease also helps promoting better memory. 5. Avoid stressful situations. Live a simple life and avoid aggravations. Organize your time and prepare for the next day in anticipation. 6. Sleep well, avoid any interruptions of sleep and avoid any distractions in the bedroom that may interfere with adequate sleep quality 7. Avoid sugar, avoid sweets as there is a strong link between excessive sugar intake, diabetes, and cognitive impairment The Mediterranean diet has been shown to help patients reduce the risk of progressive memory disorders and reduces cardiovascular risk. This includes eating fish, eat fruits and green leafy vegetables, nuts like almonds and hazelnuts, walnuts, and also use olive oil. Avoid fast foods and fried foods as much as possible. Avoid sweets and sugar as sugar use has been linked to worsening of memory function.  There is always a concern of gradual progression of memory problems. If this is the case, then we may need to adjust level of care according to patient needs. Support, both to the patient and caregiver, should then be put into place.

## 2019-04-06 NOTE — Progress Notes (Signed)
NEUROLOGY FOLLOW UP OFFICE NOTE  Lorraine Gilbert [id number]  DOB: 1932-07-03  HISTORY OF PRESENT ILLNESS: I had the pleasure of seeing Lorraine Gilbert in follow-up in the neurology clinic on 04/06/2019. She was last seen 6 months ago for memory loss and vertigo. Her daughter-in-law [name] is present during the visit to provide additional information. Since her last visit, she had called our office about continued dizziness and was referred for vestibular therapy but did not proceed. She states the dizziness is getting better. Her memory is good, [name] agrees. She continues to manage her own medications, they both deny any difficulties with this. She continues to cook and denies leaving the stove on. She does not drive. Family manages finances. Her main concern today is body aches and pains, she has a lot of pain all over from her neck down to her back, injections did not help. Sleep is okay sometimes. No headaches, vision changes. She has had some falls. No paranoia or hallucinations.   History on Initial Assessment 08/20/2014: This is a pleasant 86 yo RH woman with a history of atrial fibrillation on anticoagulation with Eliquis, hypertension, hyperlipidemia, diabetes, anxiety, vitamin D deficiency, polymyalgia rheumatica, who presented for evaluation of worsening memory. She started noticing symptoms over the past few months, mostly with short-term memory, she would forget things, occasionally forget her medications, or get disoriented when driving in unfamiliar places. She has noticed some word-finding difficulties. There are times she would "feel lost" when in crowded places like the mall last week. She denies any missed bill payments, she cooks and drives without difficulties, no difficulties with ADLs.  She was found to have atrial fibrillation after episodes of syncope 2-3 years ago. She has had brief sharp pains in the occipital region lasting a few seconds occurring around once a week, with  associated photophobia, no nausea/vomiting. She has occasional dizziness. She denies any vision changes except for blurred vision from cataracts and glaucoma. She has chronic neck and back pain. She has numbness and tingling in both hands, and occasional pain in her calves. She denies any bowel/bladder dysfunction, no anosmia or tremors. She reports her mother had "the beginning of Alzheimer's" at age 91. She had a head injury when younger, hit her head and was unconscious until she woke up in the hospital. She denies any alcohol intake.  Records and images were personally reviewed where available.  I personally reviewed MRI brain without contrast which did not show any acute changes. There was mild diffuse atrophy and mild chronic microvascular disease. TSH and B12 normal.   PAST MEDICAL HISTORY: Past Medical History:  Diagnosis Date  . A-fib ([place])   . Abnormal heart rhythm   . Allergic rhinitis   . Anxiety   . Asthma   . Diabetes mellitus without complication ([place])    Type II  . GERD (gastroesophageal reflux disease)   . Headache   . Hypertension   . Neuromuscular disorder ([place])    Siactic pain in right leg  . Osteopenia   . Rheumatoid arthritis ([place])   . Rotator cuff tear arthropathy    Right  . Vertigo     MEDICATIONS: Current Outpatient Medications on File Prior to Visit  Medication Sig Dispense Refill  . amiodarone (PACERONE) 200 MG tablet Take 200 mg by mouth daily.     Marland Kitchen apixaban (ELIQUIS) 2.5 MG TABS tablet Take 2.5 mg by mouth 2 (two) times daily.     Marland Kitchen diltiazem (TIAZAC) 180 MG 24 hr capsule  diltiazem CD 180 mg capsule,extended release 24 hr  TAKE 1 CAPSULE BY MOUTH ONCE DAILY    . escitalopram (LEXAPRO) 10 MG tablet Take 10 mg by mouth daily.    . fluticasone furoate-vilanterol (BREO ELLIPTA) 100-25 MCG/INH AEPB Inhale 1 puff into the lungs daily. (Patient taking differently: Inhale 1 puff into the lungs as needed. ) 14 each 0  . folic acid (FOLVITE) 1 MG tablet Take 1  mg by mouth every morning.     . nitroGLYCERIN (NITROSTAT) 0.4 MG SL tablet Place 0.4 mg under the tongue every 5 (five) minutes as needed for chest pain.     . pantoprazole (PROTONIX) 40 MG tablet Take 30- 60 min before your first and last meals of the day 60 tablet 2  . ticagrelor (BRILINTA) 90 MG TABS tablet Take 90 mg by mouth.     . Vitamin D, Ergocalciferol, (DRISDOL) 1.25 MG (50000 UT) CAPS capsule Take 1 capsule by mouth every Wednesday.     No current facility-administered medications on file prior to visit.    ALLERGIES: Allergies  Allergen Reactions  . Tylenol With Codeine #3 [Acetaminophen-Codeine] Other (See Comments)    Makes patient feel like she is flying  . Ibuprofen Other (See Comments)    Confusion, Patient states that the 800mg  Motrin made her feel like she was flying.  . Morphine Rash and Other (See Comments)    Patient says it makes her feel like she is flying.(Patient states on 03/15/2018, that she has never taken Morphine).    FAMILY HISTORY: Family History  Problem Relation Age of Onset  . Emphysema Father        smoked  . Heart disease Mother   . Heart attack Son   . Breast cancer Neg Hx     SOCIAL HISTORY: Social History   Socioeconomic History  . Marital status: Married    Spouse name: Kathi Ludwig  . Number of children: 6  . Years of education: Not on file  . Highest education level: Not on file  Occupational History  . Occupation: Retired Event organiser  Tobacco Use  . Smoking status: Never Smoker  . Smokeless tobacco: Never Used  Substance and Sexual Activity  . Alcohol use: No    Alcohol/week: 0.0 standard drinks  . Drug use: No  . Sexual activity: Not on file  Other Topics Concern  . Not on file  Social History Narrative  . Not on file   Social Determinants of Health   Financial Resource Strain:   . Difficulty of Paying Living Expenses:   Food Insecurity:   . Worried About Charity fundraiser in the Last Year:   . Academic librarian in the Last Year:   Transportation Needs:   . Film/video editor (Medical):   Marland Kitchen Lack of Transportation (Non-Medical):   Physical Activity:   . Days of Exercise per Week:   . Minutes of Exercise per Session:   Stress:   . Feeling of Stress :   Social Connections:   . Frequency of Communication with Friends and Family:   . Frequency of Social Gatherings with Friends and Family:   . Attends Religious Services:   . Active Member of Clubs or Organizations:   . Attends Archivist Meetings:   Marland Kitchen Marital Status:   Intimate Partner Violence:   . Fear of Current or Ex-Partner:   . Emotionally Abused:   Marland Kitchen Physically Abused:   . Sexually Abused:  REVIEW OF SYSTEMS: Constitutional: No fevers, chills, or sweats, no generalized fatigue, change in appetite Eyes: No visual changes, double vision, eye pain Ear, nose and throat: No hearing loss, ear pain, nasal congestion, sore throat Cardiovascular: No chest pain, palpitations Respiratory:  No shortness of breath at rest or with exertion, wheezes GastrointestinaI: No nausea, vomiting, diarrhea, abdominal pain, fecal incontinence Genitourinary:  No dysuria, urinary retention or frequency Musculoskeletal:  + neck pain, back pain Integumentary: No rash, pruritus, skin lesions Neurological: as above Psychiatric: No depression, insomnia, anxiety Endocrine: No palpitations, fatigue, diaphoresis, mood swings, change in appetite, change in weight, increased thirst Hematologic/Lymphatic:  No anemia, purpura, petechiae. Allergic/Immunologic: no itchy/runny eyes, nasal congestion, recent allergic reactions, rashes  PHYSICAL EXAM: Vitals:   04/06/19 1406  BP: (!) 173/60  Pulse: 68  SpO2: 97%   General: No acute distress, slightly distressed due to pain Head:  Normocephalic/atraumatic Skin/Extremities: No rash, no edema Neurological Exam: alert and oriented to person, place, and time. No aphasia or dysarthria. Fund of knowledge  is appropriate.  Recent and remote memory are impaired.  Attention and concentration are reduced. Able to name objects and repeat. Good clock drawing today (on a prior visit, she drew it counterclockwise)  MMSE - Mini Mental State Exam 04/06/2019 06/28/2017 10/05/2016  Orientation to time 5 5 4   Orientation to Place 5 5 5   Registration 3 3 3   Attention/ Calculation 3 5 5   Recall 2 3 2   Language- name 2 objects 2 2 2   Language- repeat 1 1 1   Language- follow 3 step command 3 2 3   Language- read & follow direction 1 1 1   Write a sentence 1 1 1   Copy design 1 1 1   Total score 27 29 28    Cranial nerves: Pupils equal, round, reactive to light. Extraocular movements intact with no nystagmus. Visual fields full. Facial sensation intact. No facial asymmetry. Tongue, uvula, palate midline.  Motor: Bulk and tone normal, muscle strength 5/5 throughout with no pronator drift.Finger to nose testing intact.  Gait slow and cautious with cane, no ataxia.   IMPRESSION: This is a pleasant 86 yo RH woman with vascular risk factors including hypertension, hyperlipidemia, diabetes, atrial fibrillation on Eliquis, PMR, with mild dementia. MMSE today 27/30. Family continues to monitor her closely, they feel she is doing fine and had previously expressed concern about potential side effects of cholinesterase inhibitors. Continue to monitor. She does not drive. She reports dizziness is getting better. Continue follow-up with Ortho for neck/back pain. Follow-up in 8 months, they know to call for any changes.   Thank you for allowing me to participate in her care.  Please do not hesitate to call for any questions or concerns.    Ellouise Newer, M.D.   CC: Dr. Moreen Fowler

## 2019-04-11 DIAGNOSIS — M5412 Radiculopathy, cervical region: Secondary | ICD-10-CM | POA: Diagnosis not present

## 2019-04-23 ENCOUNTER — Ambulatory Visit: Payer: Medicare HMO | Admitting: Neurology

## 2019-04-23 ENCOUNTER — Telehealth: Payer: Self-pay | Admitting: *Deleted

## 2019-04-23 ENCOUNTER — Other Ambulatory Visit: Payer: Self-pay

## 2019-04-23 ENCOUNTER — Telehealth: Payer: Self-pay | Admitting: Internal Medicine

## 2019-04-23 MED ORDER — BREO ELLIPTA 100-25 MCG/INH IN AEPB: 1.0000 | INHALATION_SPRAY | Freq: Every day | RESPIRATORY_TRACT | 0 refills | Status: DC

## 2019-04-23 NOTE — Telephone Encounter (Signed)
LMTCB x1 for pt.  

## 2019-04-23 NOTE — Telephone Encounter (Signed)
The patient was referred to [place] from rheumatology. There was a note that patient wanted to change to our office.   The patient is established with Dr. Delice Lesch. When she arrived, both the patient and her daughter-in-law stated they were very happy with Dr. Amparo Bristol care and were a bit confused as to why the appt was scheduled here.    They plan to continue follow up w/ Dr. Delice Lesch and will discuss the new concerns with her office. The patient's copay was refunded.

## 2019-04-23 NOTE — Telephone Encounter (Signed)
LMTCB x2 for pt 

## 2019-04-23 NOTE — Telephone Encounter (Signed)
Spoke with the pt  She is c/o increased SOB since ran out of her Breo inhaler a wk ago  She is asking for samples of med  I have given her 2 samples of Breo 100 and made her telephone appt with Pharm team to discuss possible cheaper alternatives  She has appt with MW in May 2021  I advised that she call back sooner if needed

## 2019-04-23 NOTE — Telephone Encounter (Signed)
Pt returning call.  [phone]

## 2019-04-23 NOTE — Telephone Encounter (Signed)
Pt returning a phone. Pt can be reached at [phone]

## 2019-04-26 NOTE — Progress Notes (Signed)
HPI Patient presents today to [facility] to see pharmacy team for inhaler optimization.  Past medical history includes cough variant asthma vs UACS, metastatic squamous cell carcinoma, upper airway cough syndrome, pulmonary nodule, T2DM, HTN, HLD, Afib, RA. Frederik Schmidt, CPhT, assisted with assessing benefits investigation. It is unclear if patient has a deductible to meet.   [employer] Medicare Benefits Investigation for 30-day supply Breo Ellipta- $97.96 Advair HFA- $106-64 Trelegy Ellipta -$158.11 Breztri-$155.11  Patient was contacted via telephone for initial appt with pharmacy team. She lives in a 2 person household. Patient makes [phone] (social security) + [phone] (pension) each month, which is an annual income of $29,450.40. Her husband has an annual income of 9,330.20 (social security). She is unsure how much she spends OOP on prescriptions and is unsure if she has a deductible.  Number of hospitalizations in past year: 3x Number of asthma exacerbations in past year: none  Respiratory Medications Current: Breo Ellipta Tried in past: Symbicort (cost), Dulera (cost), Xopenex nebs (cost) Patient reports no known adherence challenges  OBJECTIVE Allergies  Allergen Reactions  . Tylenol With Codeine #3 [Acetaminophen-Codeine] Other (See Comments)    Makes patient feel like she is flying  . Ibuprofen Other (See Comments)    Confusion, Patient states that the 800mg  Motrin made her feel like she was flying.  . Morphine Rash and Other (See Comments)    Patient says it makes her feel like she is flying.(Patient states on 03/15/2018, that she has never taken Morphine).    Outpatient Encounter Medications as of 04/30/2019  Medication Sig  . amiodarone (PACERONE) 200 MG tablet Take 200 mg by mouth daily.   Marland Kitchen apixaban (ELIQUIS) 2.5 MG TABS tablet Take 2.5 mg by mouth 2 (two) times daily.   Marland Kitchen diltiazem (TIAZAC) 180 MG 24 hr capsule diltiazem CD 180 mg capsule,extended release 24 hr  TAKE 1 CAPSULE BY MOUTH ONCE DAILY  . escitalopram (LEXAPRO) 10 MG tablet Take 10 mg by mouth daily.  . fluticasone furoate-vilanterol (BREO ELLIPTA) 100-25 MCG/INH AEPB Inhale 1 puff into the lungs daily. (Patient taking differently: Inhale 1 puff into the lungs as needed. )  . fluticasone furoate-vilanterol (BREO ELLIPTA) 100-25 MCG/INH AEPB Inhale 1 puff into the lungs daily.  . folic acid (FOLVITE) 1 MG tablet Take 1 mg by mouth every morning.   . nitroGLYCERIN (NITROSTAT) 0.4 MG SL tablet Place 0.4 mg under the tongue every 5 (five) minutes as needed for chest pain.   . pantoprazole (PROTONIX) 40 MG tablet Take 30- 60 min before your first and last meals of the day  . ticagrelor (BRILINTA) 90 MG TABS tablet Take 90 mg by mouth.   . Vitamin D, Ergocalciferol, (DRISDOL) 1.25 MG (50000 UT) CAPS capsule Take 1 capsule by mouth every Wednesday.   No facility-administered encounter medications on file as of 04/30/2019.     Immunization History  Administered Date(s) Administered  . Influenza Split 09/18/2013, 10/19/2014, 09/18/2016  . Influenza Whole 10/19/2011, 09/19/2015  . Influenza, High Dose Seasonal PF 10/28/2018  . Influenza,inj,quad, With Preservative 10/18/2017  . Influenza-Unspecified 10/19/2014  . Pneumococcal Conjugate-13 10/28/2018     PFTs PFT Results Latest Ref Rng & Units 01/06/2017 12/04/2013  FVC-Pre L 1.84 2.08  FVC-Predicted Pre % 86 91  FVC-Post L 1.84 1.91  FVC-Predicted Post % 86 84  Pre FEV1/FVC % % 83 83  Post FEV1/FCV % % 86 86  FEV1-Pre L 1.52 1.74  FEV1-Predicted Pre % 97 103  FEV1-Post L 1.59 1.64  DLCO UNC% % 67 79  DLCO COR %Predicted % 100 101  TLC L 4.62 4.64  TLC % Predicted % 100 100  RV % Predicted % 121 102     Eosinophils - N/A  IgE - N/A   Assessment   1. Inhaler Optimization  PIFR - unable to complete due to virtual appt  Optimal inhaler for patient would be Symbicort considering patient requires ICS/LABA inhaler, likely does  not meet $600 OOP to qualify for [employer] patient assistance program, and qualifies for Symbicort patient assistance program application (PAP). Mailed patient Symbicort patient assistance program application (confirmed address). Instructed her to make appt with clinical pharmacist to counsel on Symbicort inhaler. Advised patient to bring completed application and income documents to appt. Patient verbalized understanding. Will follow up with patient in 2 weeks to ensure she has received application and scheduled pharmacist appt.  2. Medication Reconciliation  A drug regimen assessment was performed, including review of allergies, interactions, disease-state management, dosing and immunization history. Medications were reviewed with the patient, including name, instructions, indication, goals of therapy, potential side effects, importance of adherence, and safe use. Patient is on appropriately reduced dose of Eliquis.   Drug interaction(s): no contraindications identified -amiodarone and escitalopram: inc QTc prolongation - defer to provider for appropriate monitoring  Discontinued -Diltiazem - patient said she stopped therapy "a long time ago" when performing med rec  3. Immunizations  Patient is indicated for the shingles vaccination. To keep instructions as simple as possible will defer discussion to future appt.  PLAN 1. Apply for Symbicort patient assistance program application via [employer]. --Patient will schedule appt with clinical pharmacist to counsel on how to use Symbicort and will bring completed application and income documents to appt.  --Once application is approved, will start Symbicort and stop Breo Ellipta. --Will follow up in 2 weeks to ensure appt is scheduled and she has received application  All questions encouraged and answered.  Instructed patient to reach out with any further questions or concerns.  Thank you for allowing pharmacy to participate in this patient's  care.  This appointment required 30 minutes of patient care (this includes precharting, chart review, review of results, virtual care, etc.).  Drexel Iha, PharmD PGY2 Ambulatory Care Pharmacy Resident

## 2019-04-30 ENCOUNTER — Ambulatory Visit (INDEPENDENT_AMBULATORY_CARE_PROVIDER_SITE_OTHER): Payer: Medicare HMO | Admitting: Pharmacist

## 2019-04-30 ENCOUNTER — Other Ambulatory Visit: Payer: Self-pay

## 2019-04-30 DIAGNOSIS — J45991 Cough variant asthma: Secondary | ICD-10-CM

## 2019-04-30 DIAGNOSIS — Z20828 Contact with and (suspected) exposure to other viral communicable diseases: Secondary | ICD-10-CM | POA: Diagnosis not present

## 2019-05-11 DIAGNOSIS — R05 Cough: Secondary | ICD-10-CM | POA: Diagnosis not present

## 2019-05-11 DIAGNOSIS — J45909 Unspecified asthma, uncomplicated: Secondary | ICD-10-CM | POA: Diagnosis not present

## 2019-05-11 DIAGNOSIS — I739 Peripheral vascular disease, unspecified: Secondary | ICD-10-CM | POA: Diagnosis not present

## 2019-05-11 DIAGNOSIS — I4892 Unspecified atrial flutter: Secondary | ICD-10-CM | POA: Diagnosis not present

## 2019-05-17 DIAGNOSIS — M5030 Other cervical disc degeneration, unspecified cervical region: Secondary | ICD-10-CM | POA: Diagnosis not present

## 2019-05-18 ENCOUNTER — Telehealth: Payer: Self-pay | Admitting: Pharmacist

## 2019-05-18 NOTE — Telephone Encounter (Signed)
Called patient on 05/18/2019 at 3:08 PM   Patient confirms she has received Symbicort patient assistance program application, however, has not completed it. She states she will complete it today then mail back to the office so Dr. Melvyn Novas can complete prescriber portion. Advised patient once she receives Symbicort in the mail to make a pharmacy appt to thoroughly counsel patient on how to use the inhaler. Patient verbalized understanding.  Thank you for involving pharmacy to assist in providing this patient's care.   Drexel Iha, PharmD PGY2 Ambulatory Care Pharmacy Resident

## 2019-05-22 DIAGNOSIS — I480 Paroxysmal atrial fibrillation: Secondary | ICD-10-CM | POA: Diagnosis not present

## 2019-05-22 DIAGNOSIS — J300 Vasomotor rhinitis: Secondary | ICD-10-CM | POA: Diagnosis not present

## 2019-05-22 DIAGNOSIS — I10 Essential (primary) hypertension: Secondary | ICD-10-CM | POA: Diagnosis not present

## 2019-05-22 DIAGNOSIS — I2510 Atherosclerotic heart disease of native coronary artery without angina pectoris: Secondary | ICD-10-CM | POA: Diagnosis not present

## 2019-05-22 DIAGNOSIS — J4540 Moderate persistent asthma, uncomplicated: Secondary | ICD-10-CM | POA: Diagnosis not present

## 2019-05-22 DIAGNOSIS — M79604 Pain in right leg: Secondary | ICD-10-CM | POA: Diagnosis not present

## 2019-05-22 DIAGNOSIS — M79605 Pain in left leg: Secondary | ICD-10-CM | POA: Diagnosis not present

## 2019-05-22 DIAGNOSIS — R05 Cough: Secondary | ICD-10-CM | POA: Diagnosis not present

## 2019-05-23 DIAGNOSIS — J4540 Moderate persistent asthma, uncomplicated: Secondary | ICD-10-CM | POA: Diagnosis not present

## 2019-05-23 DIAGNOSIS — J300 Vasomotor rhinitis: Secondary | ICD-10-CM | POA: Diagnosis not present

## 2019-05-28 DIAGNOSIS — I739 Peripheral vascular disease, unspecified: Secondary | ICD-10-CM | POA: Diagnosis not present

## 2019-05-28 DIAGNOSIS — I8312 Varicose veins of left lower extremity with inflammation: Secondary | ICD-10-CM | POA: Diagnosis not present

## 2019-05-28 DIAGNOSIS — R6889 Other general symptoms and signs: Secondary | ICD-10-CM | POA: Diagnosis not present

## 2019-05-28 DIAGNOSIS — I10 Essential (primary) hypertension: Secondary | ICD-10-CM | POA: Diagnosis not present

## 2019-05-28 DIAGNOSIS — I2510 Atherosclerotic heart disease of native coronary artery without angina pectoris: Secondary | ICD-10-CM | POA: Diagnosis not present

## 2019-05-28 DIAGNOSIS — I8311 Varicose veins of right lower extremity with inflammation: Secondary | ICD-10-CM | POA: Diagnosis not present

## 2019-05-30 NOTE — Progress Notes (Signed)
Mrs. Acevedo presents today for follow-up after completing radiation to her left inguinal area on 07/18/2018.  She received 30 Gy in 10 fractions.  Pain: She has generalized pain, especially in her legs and right side.  She states cardiology wants to run some test to figure out why her legs are so weak.  She has had multiple falls.  Fatigue: Yes  Skin: No issues with skin irritation or breakdown.   BP (!) 158/51 (BP Location: Left Arm, Patient Position: Sitting)   Pulse 70   Temp 98.2 F (36.8 C) (Temporal)   Resp (!) 22   Ht 5\' 3"  (1.6 m)   Wt 136 lb 8 oz (61.9 kg)   SpO2 100%   BMI 24.18 kg/m    Wt Readings from Last 3 Encounters:  05/31/19 136 lb 8 oz (61.9 kg)  04/06/19 134 lb (60.8 kg)  03/01/19 135 lb 3.2 oz (61.3 kg)

## 2019-05-30 NOTE — Progress Notes (Signed)
Radiation Oncology         [phone] ________________________________  Name: Lorraine Gilbert MRN: [id number]  Date: 05/31/2019  DOB: April 15, 1932  Follow-Up Visit Note  CC: [name], MD  [name], MD    ICD-10-CM   1. Metastatic squamous cell carcinoma involving lymph node with unknown primary site [facility])  C77.9    C80.1     Diagnosis: Metastatic squamous cell carcinomapresenting in the left inguinal area (unknown primary site)  Interval Since Last Radiation: 11 months   07/05/2018 - 07/18/2018: Left Inguinal / 30 Gy in 10 fractions  Narrative:  The patient returns today for routine follow-up. Since her last visit, she underwent at CT of chest on 03/02/2019. Results showed scattered ground-glass nodularity bilaterally, greatest in the lower lobes subpleural distribution. Overall, the pattern suggested inflammatory or infectious change. The left upper lobe nodules reported on the prior PET scan were no longer identified. Finally, there were likely reactive borderline enlarged mediastinal and right hilar lymph nodes without frankly pathologic adenopathy.   On review of systems, she reports fatigue and generalized pain, especially in her legs and right side. She also reports multiple recent falls. She denies skin irritation.  ALLERGIES:  is allergic to ibuprofen.  Meds: Current Outpatient Medications  Medication Sig Dispense Refill  . ACCU-CHEK AVIVA PLUS test strip 1 each as directed.    . Accu-Chek Softclix Lancets lancets 1 each by Other route as directed.    Marland Kitchen albuterol (VENTOLIN HFA) 108 (90 Base) MCG/ACT inhaler     . amiodarone (PACERONE) 200 MG tablet Take 1 tablet by mouth daily.    Marland Kitchen apixaban (ELIQUIS) 2.5 MG TABS tablet Take 2.5 mg by mouth 2 (two) times daily.     . benzonatate (TESSALON) 100 MG capsule Take by mouth.    . Blood Glucose Calibration (ACCU-CHEK AVIVA) SOLN     . escitalopram (LEXAPRO) 10 MG tablet Take 10 mg by mouth daily.    . fluticasone  furoate-vilanterol (BREO ELLIPTA) 100-25 MCG/INH AEPB Inhale 1 puff into the lungs daily. (Patient taking differently: Inhale 1 puff into the lungs as needed. ) 14 each 0  . fluticasone furoate-vilanterol (BREO ELLIPTA) 100-25 MCG/INH AEPB Inhale 1 puff into the lungs daily. 14 each 0  . folic acid (FOLVITE) 1 MG tablet Take 1 mg by mouth every morning.     . montelukast (SINGULAIR) 10 MG tablet     . Multiple Vitamin (MULTIVITAMIN WITH MINERALS) TABS tablet Take 1 tablet by mouth daily.    . nitroGLYCERIN (NITROSTAT) 0.4 MG SL tablet Place 0.4 mg under the tongue every 5 (five) minutes as needed for chest pain.     . pantoprazole (PROTONIX) 40 MG tablet Take 30- 60 min before your first and last meals of the day (Patient taking differently: daily. Take 30- 60 min before your first and last meals of the day) 60 tablet 2  . ticagrelor (BRILINTA) 90 MG TABS tablet Take 90 mg by mouth 2 (two) times daily.     . Vitamin D, Ergocalciferol, (DRISDOL) 1.25 MG (50000 UT) CAPS capsule Take 1 capsule by mouth every Wednesday.     No current facility-administered medications for this encounter.    Physical Findings: The patient is in no acute distress. Patient is alert and oriented.  Accompanied by her daughter on evaluation today  height is 5\' 3"  (1.6 m) and weight is 136 lb 8 oz (61.9 kg). Her temporal temperature is 98.2 F (36.8 C). Her blood pressure is  158/51 (abnormal) and her pulse is 70. Her respiration is 22 (abnormal) and oxygen saturation is 100%.   Lungs are clear to auscultation bilaterally. Heart has regular rate and rhythm. No palpable cervical, supraclavicular, or axillary adenopathy. Abdomen soft, non-tender, normal bowel sounds.   Examination of the inguinal areas reveals no palpable adenopathy.  No appreciable radiation changes noted in the left inguinal area.  A pelvic exam was performed.  The external genitalia are unremarkable.  A speculum exam is performed.  No mucosal lesions noted in  the vaginal vault.  On bimanual and rectovaginal examination no pelvic masses appreciated.  Lab Findings: Lab Results  Component Value Date   WBC 9.4 07/28/2017   HGB 13.6 07/28/2017   HCT 43.6 07/28/2017   MCV 94.6 07/28/2017   PLT 256 07/28/2017    Radiographic Findings: No results found.  Impression:  Metastatic squamous cell carcinomapresenting in the left inguinal area (unknown primary site).     No evidence of recurrence on clinical exam today.  Plan: Follow-up with radiation oncology in 6 months.  ____________________________________  Blair Promise, PhD, MD  This document serves as a record of services personally performed by Gery Pray, MD. It was created on his behalf by Clerance Lav, a trained medical scribe. The creation of this record is based on the scribe's personal observations and the provider's statements to them. This document has been checked and approved by the attending provider.

## 2019-05-31 ENCOUNTER — Encounter: Payer: Self-pay | Admitting: Radiation Oncology

## 2019-05-31 ENCOUNTER — Other Ambulatory Visit: Payer: Self-pay

## 2019-05-31 ENCOUNTER — Ambulatory Visit
Admission: RE | Admit: 2019-05-31 | Discharge: 2019-05-31 | Disposition: A | Discharge status: Home / Self Care | Payer: Medicare HMO | Source: Ambulatory Visit | Attending: Radiation Oncology | Admitting: Radiation Oncology

## 2019-05-31 DIAGNOSIS — Z7901 Long term (current) use of anticoagulants: Secondary | ICD-10-CM | POA: Insufficient documentation

## 2019-05-31 DIAGNOSIS — Z79899 Other long term (current) drug therapy: Secondary | ICD-10-CM | POA: Insufficient documentation

## 2019-05-31 DIAGNOSIS — Z08 Encounter for follow-up examination after completed treatment for malignant neoplasm: Secondary | ICD-10-CM | POA: Diagnosis not present

## 2019-05-31 DIAGNOSIS — Z923 Personal history of irradiation: Secondary | ICD-10-CM | POA: Insufficient documentation

## 2019-05-31 DIAGNOSIS — C801 Malignant (primary) neoplasm, unspecified: Secondary | ICD-10-CM

## 2019-05-31 DIAGNOSIS — C774 Secondary and unspecified malignant neoplasm of inguinal and lower limb lymph nodes: Secondary | ICD-10-CM | POA: Diagnosis not present

## 2019-05-31 DIAGNOSIS — C779 Secondary and unspecified malignant neoplasm of lymph node, unspecified: Secondary | ICD-10-CM

## 2019-05-31 DIAGNOSIS — Z8589 Personal history of malignant neoplasm of other organs and systems: Secondary | ICD-10-CM | POA: Diagnosis not present

## 2019-05-31 DIAGNOSIS — R918 Other nonspecific abnormal finding of lung field: Secondary | ICD-10-CM | POA: Diagnosis not present

## 2019-06-05 ENCOUNTER — Ambulatory Visit: Payer: Medicare HMO | Admitting: Internal Medicine

## 2019-06-07 DIAGNOSIS — M5030 Other cervical disc degeneration, unspecified cervical region: Secondary | ICD-10-CM | POA: Diagnosis not present

## 2019-06-07 DIAGNOSIS — M5136 Other intervertebral disc degeneration, lumbar region: Secondary | ICD-10-CM | POA: Insufficient documentation

## 2019-06-08 ENCOUNTER — Ambulatory Visit: Payer: Medicare HMO | Admitting: Adult Health

## 2019-06-11 ENCOUNTER — Encounter: Payer: Self-pay | Admitting: Adult Health

## 2019-06-11 ENCOUNTER — Ambulatory Visit: Payer: Medicare HMO | Admitting: Adult Health

## 2019-06-11 ENCOUNTER — Other Ambulatory Visit: Payer: Self-pay

## 2019-06-11 DIAGNOSIS — R911 Solitary pulmonary nodule: Secondary | ICD-10-CM | POA: Diagnosis not present

## 2019-06-11 DIAGNOSIS — C801 Malignant (primary) neoplasm, unspecified: Secondary | ICD-10-CM

## 2019-06-11 DIAGNOSIS — C779 Secondary and unspecified malignant neoplasm of lymph node, unspecified: Secondary | ICD-10-CM | POA: Diagnosis not present

## 2019-06-11 DIAGNOSIS — J45991 Cough variant asthma: Secondary | ICD-10-CM | POA: Diagnosis not present

## 2019-06-11 DIAGNOSIS — I739 Peripheral vascular disease, unspecified: Secondary | ICD-10-CM | POA: Diagnosis not present

## 2019-06-11 MED ORDER — BREO ELLIPTA 100-25 MCG/INH IN AEPB: 1.0000 | INHALATION_SPRAY | Freq: Every day | RESPIRATORY_TRACT | 0 refills | Status: DC

## 2019-06-11 NOTE — Progress Notes (Signed)
@Patient  ID: Lorraine Gilbert, female    DOB: [birth year], 86 y.o.   MRN: [id number]    Referring provider: [name], MD  HPI: 86 year old female never smoker followed for cough variant asthma Medical history significant for A. fib on Eliquis and amiodarone, CAD s/p stent  Medical history significant for metastatic squamous cell carcinoma unknown primary) involving lymph node -left inguinal status post XRT (6/17-6/30/20)   TEST/EVENTS :  CT chest February 2021 showed scattered groundglass nodularity bilaterally lower lobe subpleural distribution.  Resolved left upper lobe nodules  02/06/2018 Imaging     Impression CT abdomen and pelvis 1. Enlarged left groin lymph node which is of uncertain etiology. This would be amenable to percutaneous sampling if deemed clinically appropriate. 2. Hepatic steatosis. Focal area of low-attenuation in the far lateral left hepatic lobe, not clearly seen on the prior exam. Consider liver ultrasound for further evaluation.  3. Mild L1 compression deformity which is new.    02/28/2018 Pathology Results    Left groin lymph node, needle core biopsy:       -Metastatic squamous cell carcinoma, see comment  P16 stain is strongly positive, suggestive of cervical or anogenital origin in this location. Immunohistochemical stains for CK5, p63, and pan cytokeratin are positive. ER is weakly positive. CK7 and CK20 are negative.    02/28/2018 Procedure    Technically successful fine-needle aspiration and core biopsy of an abnormal appearing left inguinal lymph node as described above.    03/15/2018 Pathology Results    1. Cervix, biopsy, 12 o'clock - BENIGN SQUAMOUS MUCOSA WITH INFLAMMATION. - NO DYSPLASIA OR MALIGNANCY. 2. Endocervix, curettage - BENIGN SQUAMOUS EPITHELIUM.    03/23/2018 PET scan    1. Isolated intensely hypermetabolic enlarged LEFT inguinal lymph node. No clear primary identified. 2. No abnormal activity associated with  the vagina or anorectal tissue. 3. No hypermetabolic lymph nodes in the pelvis. 4. Two adjacent nodules in the RIGHT upper lobe with very low metabolic activity. These are not favored primary malignancies. Recommend follow-up per Fleischner criteria. Non-contrast chest CT at 6-12 months is recommended.      06/11/19  Follow up : Asthma  Patient presents for a 49-month follow-up.  Patient remains on Breo daily.  She says over the last month  Has been having more issues with her legs. Says chronic dyspnea is at baseline , complains of increased lower extremity swelling and leg pain with walking she denies any increased cough or wheezing.  She says that she has significant pain in her legs with walking and is not able to walk for long distances due to the pain. She has been following up with cardiology. She has been set up for venous and arrterial Dopplers pending this week.  Allergies  Allergen Reactions  . Ibuprofen Other (See Comments)    Confusion, Patient states that the 800mg  Motrin made her feel like she was flying.    Immunization History  Administered Date(s) Administered  . Influenza Split 09/18/2013, 10/19/2014, 09/18/2016  . Influenza Whole 10/19/2011, 09/19/2015  . Influenza, High Dose Seasonal PF 10/28/2018  . Influenza,inj,quad, With Preservative 10/18/2017  . Influenza-Unspecified 10/19/2014  . Pneumococcal Conjugate-13 10/28/2018    Past Medical History:  Diagnosis Date  . A-fib ([place])   . Abnormal heart rhythm   . Allergic rhinitis   . Anxiety   . Asthma   . Diabetes mellitus without complication ([place])    Type II  . GERD (gastroesophageal reflux disease)   . Headache   .  Hypertension   . Neuromuscular disorder ([place])    Siactic pain in right leg  . Osteopenia   . Rheumatoid arthritis ([place])   . Rotator cuff tear arthropathy    Right  . Vertigo     Tobacco History: Social History   Tobacco Use  Smoking Status Never Smoker  Smokeless Tobacco Never Used    Counseling given: Not Answered   Outpatient Medications Prior to Visit  Medication Sig Dispense Refill  . ACCU-CHEK AVIVA PLUS test strip 1 each as directed.    . Accu-Chek Softclix Lancets lancets 1 each by Other route as directed.    Marland Kitchen albuterol (VENTOLIN HFA) 108 (90 Base) MCG/ACT inhaler     . amiodarone (PACERONE) 200 MG tablet Take 1 tablet by mouth daily.    Marland Kitchen apixaban (ELIQUIS) 2.5 MG TABS tablet Take 2.5 mg by mouth 2 (two) times daily.     . benzonatate (TESSALON) 100 MG capsule Take by mouth.    . Blood Glucose Calibration (ACCU-CHEK AVIVA) SOLN     . escitalopram (LEXAPRO) 10 MG tablet Take 10 mg by mouth daily.    . fluticasone furoate-vilanterol (BREO ELLIPTA) 100-25 MCG/INH AEPB Inhale 1 puff into the lungs daily. (Patient taking differently: Inhale 1 puff into the lungs as needed. ) 14 each 0  . fluticasone furoate-vilanterol (BREO ELLIPTA) 100-25 MCG/INH AEPB Inhale 1 puff into the lungs daily. 14 each 0  . folic acid (FOLVITE) 1 MG tablet Take 1 mg by mouth every morning.     . montelukast (SINGULAIR) 10 MG tablet     . Multiple Vitamin (MULTIVITAMIN WITH MINERALS) TABS tablet Take 1 tablet by mouth daily.    . nitroGLYCERIN (NITROSTAT) 0.4 MG SL tablet Place 0.4 mg under the tongue every 5 (five) minutes as needed for chest pain.     . pantoprazole (PROTONIX) 40 MG tablet Take 30- 60 min before your first and last meals of the day (Patient taking differently: daily. Take 30- 60 min before your first and last meals of the day) 60 tablet 2  . ticagrelor (BRILINTA) 90 MG TABS tablet Take 90 mg by mouth 2 (two) times daily.     . Vitamin D, Ergocalciferol, (DRISDOL) 1.25 MG (50000 UT) CAPS capsule Take 1 capsule by mouth every Wednesday.     No facility-administered medications prior to visit.     Review of Systems:   Constitutional:   No  weight loss, night sweats,  Fevers, chills, + fatigue, or  lassitude.  HEENT:   No headaches,  Difficulty swallowing,  Tooth/dental  problems, or  Sore throat,                No sneezing, itching, ear ache, nasal congestion, post nasal drip,   CV:  No chest pain,  Orthopnea, PND,   anasarca, dizziness, palpitations, syncope. + leg pain   GI  No heartburn, indigestion, abdominal pain, nausea, vomiting, diarrhea, change in bowel habits, loss of appetite, bloody stools.   Resp: No shortness of breath with exertion or at rest.  No excess mucus, no productive cough,  No non-productive cough,  No coughing up of blood.  No change in color of mucus.  No wheezing.  No chest wall deformity  Skin: no rash or lesions.  GU: no dysuria, change in color of urine, no urgency or frequency.  No flank pain, no hematuria   MS:  No joint pain or swelling.  No decreased range of motion.  No back pain.  Physical Exam  BP 122/60 (BP Location: Left Arm, Patient Position: Sitting, Cuff Size: Normal)   Pulse 65   Temp 97.6 F (36.4 C) (Oral)   Ht 5\' 3"  (1.6 m)   Wt 135 lb 12.8 oz (61.6 kg)   SpO2 97%   BMI 24.06 kg/m   GEN: A/Ox3; pleasant , NAD, elderly   HEENT:  [place]/AT,   NOSE-clear, THROAT-clear, no lesions, no postnasal drip or exudate noted.   NECK:  Supple w/ fair ROM; no JVD; normal carotid impulses w/o bruits; no thyromegaly or nodules palpated; no lymphadenopathy.    RESP  Clear  P & A; w/o, wheezes/ rales/ or rhonchi. no accessory muscle use, no dullness to percussion  CARD:  RRR, no m/r/g, tr  peripheral edema, pulses intact, no cyanosis or clubbing.  GI:   Soft & nt; nml bowel sounds; no organomegaly or masses detected.   Musco: Warm bil, no deformities or joint swelling noted.   Neuro: alert, no focal deficits noted.    Skin: Warm, no lesions or rashes    Lab Results:  CBC  BNP No results found for: BNP  ProBNP No results found for: PROBNP  Imaging: No results found.    PFT Results Latest Ref Rng & Units 01/06/2017 12/04/2013  FVC-Pre L 1.84 2.08  FVC-Predicted Pre % 86 91  FVC-Post L 1.84  1.91  FVC-Predicted Post % 86 84  Pre FEV1/FVC % % 83 83  Post FEV1/FCV % % 86 86  FEV1-Pre L 1.52 1.74  FEV1-Predicted Pre % 97 103  FEV1-Post L 1.59 1.64  DLCO UNC% % 67 79  DLCO COR %Predicted % 100 101  TLC L 4.62 4.64  TLC % Predicted % 100 100  RV % Predicted % 121 102    Lab Results  Component Value Date   NITRICOXIDE 17 04/24/2015        Assessment & Plan:   Cough variant asthma vs UACS Asthma appears to be under control patient has no flare of cough or wheezing.   Plan  Patient Instructions  Continue on BREO 1 puff daily , rinse after use.  Follow up with Cardiology as planned.  Follow up with Dr. Melvyn Novas in 3 months and As needed   Please contact office for sooner follow up if symptoms do not improve or worsen or seek emergency care       Claudication of both lower extremities [facility]) Patient appears to be having claudication pain in the lower extremities.  He is she is following up with cardiology and has arterial and venous Dopplers planned for later this week.  Continue follow-up.  Pulmonary nodule Abnormal CT chest with pulmonary nodules.  PET scan with very low metabolic activity.  Will need to continue to follow.  Previous left upper lobe nodule was not noted on follow-up CT chest in February 2021.  Continues to have some groundglass nodularity with a subpleural distribution. Recommend follow-up CT at 6 months  Metastatic squamous cell carcinoma involving lymph node with unknown primary site [facility]) Metastatic squamous cell carcinoma involving left inguinal lymph node status post XRT in June 2020.  Continue follow-up with oncology and radiation oncology     Rexene Edison, NP

## 2019-06-11 NOTE — Patient Instructions (Addendum)
Continue on BREO 1 puff daily , rinse after use.  Follow up with Cardiology as planned.  Follow up with Dr. Melvyn Novas in 3 months and As needed   Please contact office for sooner follow up if symptoms do not improve or worsen or seek emergency care   Late add : CT chest w/o contrast in 6 months.

## 2019-06-12 ENCOUNTER — Telehealth: Payer: Self-pay | Admitting: *Deleted

## 2019-06-12 DIAGNOSIS — C779 Secondary and unspecified malignant neoplasm of lymph node, unspecified: Secondary | ICD-10-CM

## 2019-06-12 DIAGNOSIS — I739 Peripheral vascular disease, unspecified: Secondary | ICD-10-CM | POA: Insufficient documentation

## 2019-06-12 DIAGNOSIS — I70219 Atherosclerosis of native arteries of extremities with intermittent claudication, unspecified extremity: Secondary | ICD-10-CM | POA: Insufficient documentation

## 2019-06-12 DIAGNOSIS — R911 Solitary pulmonary nodule: Secondary | ICD-10-CM

## 2019-06-12 DIAGNOSIS — R918 Other nonspecific abnormal finding of lung field: Secondary | ICD-10-CM

## 2019-06-12 DIAGNOSIS — C801 Malignant (primary) neoplasm, unspecified: Secondary | ICD-10-CM

## 2019-06-12 NOTE — Assessment & Plan Note (Signed)
Abnormal CT chest with pulmonary nodules.  PET scan with very low metabolic activity.  Will need to continue to follow.  Previous left upper lobe nodule was not noted on follow-up CT chest in February 2021.  Continues to have some groundglass nodularity with a subpleural distribution. Recommend follow-up CT at 6 months

## 2019-06-12 NOTE — Assessment & Plan Note (Signed)
Metastatic squamous cell carcinoma involving left inguinal lymph node status post XRT in June 2020.  Continue follow-up with oncology and radiation oncology

## 2019-06-12 NOTE — Assessment & Plan Note (Signed)
Asthma appears to be under control patient has no flare of cough or wheezing.   Plan  Patient Instructions  Continue on BREO 1 puff daily , rinse after use.  Follow up with Cardiology as planned.  Follow up with Dr. Melvyn Novas in 3 months and As needed   Please contact office for sooner follow up if symptoms do not improve or worsen or seek emergency care

## 2019-06-12 NOTE — Telephone Encounter (Signed)
Per Rexene Edison, NP- pt will be due for ct chest without cm in 6 months to f/u nodules  I spoke with the pt and notified her of this and she verbalized understanding  Order for CT Chest was placed

## 2019-06-12 NOTE — Assessment & Plan Note (Signed)
Patient appears to be having claudication pain in the lower extremities.  He is she is following up with cardiology and has arterial and venous Dopplers planned for later this week.  Continue follow-up.

## 2019-06-14 ENCOUNTER — Telehealth: Payer: Self-pay | Admitting: Adult Health

## 2019-06-14 NOTE — Telephone Encounter (Signed)
Spoke with the pt  She states "someone called"- not sure who or where from asking for information about ? o2 I am unsure how to help with this b/c we did not order o2 and she is not using supplemental o2  I asked her to give me the phone number of who she spoke with so that we can call and get more information but she states does not have this with her and will call back 5/28  Will await a call back

## 2019-06-15 DIAGNOSIS — I739 Peripheral vascular disease, unspecified: Secondary | ICD-10-CM | POA: Diagnosis not present

## 2019-06-15 DIAGNOSIS — I2510 Atherosclerotic heart disease of native coronary artery without angina pectoris: Secondary | ICD-10-CM | POA: Diagnosis not present

## 2019-06-15 DIAGNOSIS — I8311 Varicose veins of right lower extremity with inflammation: Secondary | ICD-10-CM | POA: Diagnosis not present

## 2019-06-15 DIAGNOSIS — I8312 Varicose veins of left lower extremity with inflammation: Secondary | ICD-10-CM | POA: Diagnosis not present

## 2019-06-19 NOTE — Telephone Encounter (Signed)
Left message for patient to call back  

## 2019-06-21 DIAGNOSIS — M2550 Pain in unspecified joint: Secondary | ICD-10-CM | POA: Diagnosis not present

## 2019-06-21 DIAGNOSIS — M0579 Rheumatoid arthritis with rheumatoid factor of multiple sites without organ or systems involvement: Secondary | ICD-10-CM | POA: Diagnosis not present

## 2019-06-21 DIAGNOSIS — M5410 Radiculopathy, site unspecified: Secondary | ICD-10-CM | POA: Diagnosis not present

## 2019-06-21 DIAGNOSIS — Z6824 Body mass index (BMI) 24.0-24.9, adult: Secondary | ICD-10-CM | POA: Diagnosis not present

## 2019-06-21 DIAGNOSIS — Z79899 Other long term (current) drug therapy: Secondary | ICD-10-CM | POA: Diagnosis not present

## 2019-06-21 DIAGNOSIS — M353 Polymyalgia rheumatica: Secondary | ICD-10-CM | POA: Diagnosis not present

## 2019-06-21 DIAGNOSIS — M159 Polyosteoarthritis, unspecified: Secondary | ICD-10-CM | POA: Diagnosis not present

## 2019-06-22 NOTE — Telephone Encounter (Signed)
Patient called, letter from [facility] references oxygen. Patient is not on oxygen and does not feel she needs oxygen. Patient advised to disregard letter. Nothing further needed.

## 2019-06-25 DIAGNOSIS — R946 Abnormal results of thyroid function studies: Secondary | ICD-10-CM | POA: Diagnosis not present

## 2019-06-25 DIAGNOSIS — R42 Dizziness and giddiness: Secondary | ICD-10-CM | POA: Diagnosis not present

## 2019-06-25 DIAGNOSIS — L729 Follicular cyst of the skin and subcutaneous tissue, unspecified: Secondary | ICD-10-CM | POA: Diagnosis not present

## 2019-06-28 DIAGNOSIS — I8311 Varicose veins of right lower extremity with inflammation: Secondary | ICD-10-CM | POA: Diagnosis not present

## 2019-06-28 DIAGNOSIS — I8312 Varicose veins of left lower extremity with inflammation: Secondary | ICD-10-CM | POA: Diagnosis not present

## 2019-06-28 DIAGNOSIS — I10 Essential (primary) hypertension: Secondary | ICD-10-CM | POA: Diagnosis not present

## 2019-07-04 DIAGNOSIS — J300 Vasomotor rhinitis: Secondary | ICD-10-CM | POA: Diagnosis not present

## 2019-07-04 DIAGNOSIS — J4540 Moderate persistent asthma, uncomplicated: Secondary | ICD-10-CM | POA: Diagnosis not present

## 2019-07-04 DIAGNOSIS — R05 Cough: Secondary | ICD-10-CM | POA: Diagnosis not present

## 2019-07-11 DIAGNOSIS — S301XXA Contusion of abdominal wall, initial encounter: Secondary | ICD-10-CM | POA: Diagnosis not present

## 2019-07-11 DIAGNOSIS — M79605 Pain in left leg: Secondary | ICD-10-CM | POA: Diagnosis not present

## 2019-07-11 DIAGNOSIS — R936 Abnormal findings on diagnostic imaging of limbs: Secondary | ICD-10-CM | POA: Diagnosis not present

## 2019-07-11 DIAGNOSIS — M7732 Calcaneal spur, left foot: Secondary | ICD-10-CM | POA: Diagnosis not present

## 2019-07-11 DIAGNOSIS — S8011XA Contusion of right lower leg, initial encounter: Secondary | ICD-10-CM | POA: Diagnosis not present

## 2019-07-11 DIAGNOSIS — S8012XA Contusion of left lower leg, initial encounter: Secondary | ICD-10-CM | POA: Diagnosis not present

## 2019-07-11 DIAGNOSIS — M19072 Primary osteoarthritis, left ankle and foot: Secondary | ICD-10-CM | POA: Diagnosis not present

## 2019-07-11 DIAGNOSIS — M25572 Pain in left ankle and joints of left foot: Secondary | ICD-10-CM | POA: Diagnosis not present

## 2019-07-11 DIAGNOSIS — Z7901 Long term (current) use of anticoagulants: Secondary | ICD-10-CM | POA: Diagnosis not present

## 2019-07-12 DIAGNOSIS — Z7901 Long term (current) use of anticoagulants: Secondary | ICD-10-CM | POA: Diagnosis not present

## 2019-07-12 DIAGNOSIS — R238 Other skin changes: Secondary | ICD-10-CM | POA: Diagnosis not present

## 2019-07-12 DIAGNOSIS — G629 Polyneuropathy, unspecified: Secondary | ICD-10-CM | POA: Diagnosis not present

## 2019-08-02 DIAGNOSIS — C801 Malignant (primary) neoplasm, unspecified: Secondary | ICD-10-CM | POA: Diagnosis not present

## 2019-08-02 DIAGNOSIS — J45991 Cough variant asthma: Secondary | ICD-10-CM | POA: Diagnosis not present

## 2019-08-02 DIAGNOSIS — F0390 Unspecified dementia without behavioral disturbance: Secondary | ICD-10-CM | POA: Diagnosis not present

## 2019-08-02 DIAGNOSIS — E7800 Pure hypercholesterolemia, unspecified: Secondary | ICD-10-CM | POA: Diagnosis not present

## 2019-08-02 DIAGNOSIS — K219 Gastro-esophageal reflux disease without esophagitis: Secondary | ICD-10-CM | POA: Diagnosis not present

## 2019-08-02 DIAGNOSIS — D6869 Other thrombophilia: Secondary | ICD-10-CM | POA: Diagnosis not present

## 2019-08-02 DIAGNOSIS — E1169 Type 2 diabetes mellitus with other specified complication: Secondary | ICD-10-CM | POA: Diagnosis not present

## 2019-08-02 DIAGNOSIS — C779 Secondary and unspecified malignant neoplasm of lymph node, unspecified: Secondary | ICD-10-CM | POA: Diagnosis not present

## 2019-08-02 DIAGNOSIS — I739 Peripheral vascular disease, unspecified: Secondary | ICD-10-CM | POA: Diagnosis not present

## 2019-08-02 DIAGNOSIS — I2510 Atherosclerotic heart disease of native coronary artery without angina pectoris: Secondary | ICD-10-CM | POA: Diagnosis not present

## 2019-08-02 DIAGNOSIS — G629 Polyneuropathy, unspecified: Secondary | ICD-10-CM | POA: Diagnosis not present

## 2019-08-02 DIAGNOSIS — I4891 Unspecified atrial fibrillation: Secondary | ICD-10-CM | POA: Diagnosis not present

## 2019-08-07 ENCOUNTER — Ambulatory Visit (INDEPENDENT_AMBULATORY_CARE_PROVIDER_SITE_OTHER): Payer: Medicare HMO

## 2019-08-07 ENCOUNTER — Other Ambulatory Visit: Payer: Self-pay

## 2019-08-07 ENCOUNTER — Encounter: Payer: Self-pay | Admitting: Pulmonary Disease

## 2019-08-07 ENCOUNTER — Ambulatory Visit: Payer: Medicare HMO | Admitting: Pulmonary Disease

## 2019-08-07 VITALS — BP 124/64 | HR 65 | Temp 98.3°F | Ht 63.0 in | Wt 135.8 lb

## 2019-08-07 DIAGNOSIS — J45991 Cough variant asthma: Secondary | ICD-10-CM

## 2019-08-07 DIAGNOSIS — J309 Allergic rhinitis, unspecified: Secondary | ICD-10-CM

## 2019-08-07 DIAGNOSIS — R918 Other nonspecific abnormal finding of lung field: Secondary | ICD-10-CM | POA: Diagnosis not present

## 2019-08-07 DIAGNOSIS — I10 Essential (primary) hypertension: Secondary | ICD-10-CM | POA: Diagnosis not present

## 2019-08-07 DIAGNOSIS — C779 Secondary and unspecified malignant neoplasm of lymph node, unspecified: Secondary | ICD-10-CM

## 2019-08-07 DIAGNOSIS — M05749 Rheumatoid arthritis with rheumatoid factor of unspecified hand without organ or systems involvement: Secondary | ICD-10-CM

## 2019-08-07 DIAGNOSIS — R05 Cough: Secondary | ICD-10-CM | POA: Diagnosis not present

## 2019-08-07 DIAGNOSIS — C801 Malignant (primary) neoplasm, unspecified: Secondary | ICD-10-CM | POA: Diagnosis not present

## 2019-08-07 LAB — CBC WITH DIFFERENTIAL/PLATELET
Basophils Absolute: 0 10*3/uL (ref 0.0–0.1)
Basophils Relative: 0.3 % (ref 0.0–3.0)
Eosinophils Absolute: 0.2 10*3/uL (ref 0.0–0.7)
Eosinophils Relative: 1.9 % (ref 0.0–5.0)
HCT: 40.3 % (ref 36.0–46.0)
Hemoglobin: 13.4 g/dL (ref 12.0–15.0)
Lymphocytes Relative: 25.5 % (ref 12.0–46.0)
Lymphs Abs: 2 10*3/uL (ref 0.7–4.0)
MCHC: 33.2 g/dL (ref 30.0–36.0)
MCV: 89.9 fl (ref 78.0–100.0)
Monocytes Absolute: 0.9 10*3/uL (ref 0.1–1.0)
Monocytes Relative: 11.4 % (ref 3.0–12.0)
Neutro Abs: 4.7 10*3/uL (ref 1.4–7.7)
Neutrophils Relative %: 60.9 % (ref 43.0–77.0)
Platelets: 301 10*3/uL (ref 150.0–400.0)
RBC: 4.48 Mil/uL (ref 3.87–5.11)
RDW: 14.8 % (ref 11.5–15.5)
WBC: 7.7 10*3/uL (ref 4.0–10.5)

## 2019-08-07 NOTE — Assessment & Plan Note (Signed)
Plan: Chest x-ray today Obtain CT imaging as currently planned in August/2021

## 2019-08-07 NOTE — Assessment & Plan Note (Signed)
Plan: Continue follow-up with oncology as well as radiation oncology

## 2019-08-07 NOTE — Progress Notes (Signed)
@Patient  ID: Lorraine Gilbert, female    DOB: 1933/01/08, 86 y.o.   MRN: [id number]  Chief Complaint  Patient presents with  . Follow-up    cough    Referring provider: [name], MD  HPI:  86 year old female never smoker followed in our office for upper airway cough syndrome/cough variant asthma and history of pulmonary nodules on metastatic squamous cell carcinoma status post XRT in June/2020  PMH: Type 2 diabetes, A. fib, hypertension, hyperlipidemia, rheumatoid arthritis Smoker/ Smoking History: Never smoker Maintenance: Breo Ellipta 100 Pt of: Dr. Melvyn Novas  08/07/2019  - Visit   86 year old female never smoker followed in our office for cough variant asthma.  She was last seen back in May/2021 by TP NP.  Plan of care at that office visit is as follows: Continue Adair Patter, continue follow-up with cardiology, follow-up with Dr. Melvyn Novas in 3 months and as needed, will recommend follow-up CT in 6 months to follow pulmonary nodules, it was recommended the patient continue to follow-up with oncology as well as radiation oncology for her known metastatic squamous cell carcinoma involving left inguinal lymph nodes status post XRT in June/2020.  Patient is presenting to office today with worsening symptoms of cough.  Patient reporting that she is coughing nonstop she cannot get any rest.  She is concerned that something worse may be going on.  We will evaluate and discuss this today.  Patient reporting that the cough has continued to worsen for the last year.  She reports she has had this cough since 1979.  She reports previous allergy testing showed she had elevations of allergies to dust, dogs as well as cats.  She currently does live with a dog.  She does not have any carpets.  She reports the dog does not live with her.  She does not use any nasal saline rinses.  Patient is concerned and would like a chest x-ray today.  Patient also reports that another doctor that she sees provided her  with a pamphlet for [place].  She wants to know she is a candidate for Xolair injections.   08/07/19 - Cough ROS:   When to the symptoms start: since 1978  How are you today: worse this year   Have you had fever/sore throat (first 5 to 7 days of URI) or Have you had cough/nasal congestion (10 to 14 days of URI) : yes  Have you used anything to treat the cough, as anything improved : sometimes inhalers  Is it a dry or wet cough: mainly dry Does the cough happen when your breathing or when you breathe out: Other any triggers to your cough, or any aggravating factors: using summer - December   Daily antihistamine: None GERD treatment: Protonix Singulair: yes   Cough checklist (bolded indicates presence):  Adherence, acid reflux, ACE inhibitor, active sinus disease, active smoking, adverse effects of medications (amiodarone/Macrodantin/bb), alpha 1, allergies, aspiration, anxiety, bronchiectasis, congestive heart failure (diastolic)     Questionaires / Pulmonary Flowsheets:   ACT:  Asthma Control Test ACT Total Score  08/07/2019 8    MMRC: No flowsheet data found.  Epworth:  No flowsheet data found.  Tests:   03/02/2019-CT chest with contrast-scattered groundglass nodularity bilaterally greatest in lower lobe subpleural distribution, overall pattern suggests inflammatory or infectious change, left upper lobe nodules reported on prior PET scan are no longer identified, likely reactive borderline enlarged mediastinal and right hilar lymph nodes, no frank pathological adenopathy  01/06/2017-pulmonary function test-FVC 1.84 (86% predicted), postbronchodilator ratio  86, postbronchodilator FEV1 1.59 (101% predicted), no bronchodilator response, mid flow reversibility, TLC 4.62 (100% predicted), DLCO 13.59 (67% predicted)  FENO:  Lab Results  Component Value Date   NITRICOXIDE 17 04/24/2015    PFT: PFT Results Latest Ref Rng & Units 01/06/2017 12/04/2013  FVC-Pre L 1.84 2.08   FVC-Predicted Pre % 86 91  FVC-Post L 1.84 1.91  FVC-Predicted Post % 86 84  Pre FEV1/FVC % % 83 83  Post FEV1/FCV % % 86 86  FEV1-Pre L 1.52 1.74  FEV1-Predicted Pre % 97 103  FEV1-Post L 1.59 1.64  DLCO UNC% % 67 79  DLCO COR %Predicted % 100 101  TLC L 4.62 4.64  TLC % Predicted % 100 100  RV % Predicted % 121 102    WALK:  No flowsheet data found.  Imaging: No results found.  Lab Results:  CBC    Component Value Date/Time   WBC 7.7 08/07/2019 1105   RBC 4.48 08/07/2019 1105   HGB 13.4 08/07/2019 1105   HCT 40.3 08/07/2019 1105   PLT 301.0 08/07/2019 1105   MCV 89.9 08/07/2019 1105   MCH 29.5 07/28/2017 1340   MCHC 33.2 08/07/2019 1105   RDW 14.8 08/07/2019 1105   LYMPHSABS 2.0 08/07/2019 1105   MONOABS 0.9 08/07/2019 1105   EOSABS 0.2 08/07/2019 1105   BASOSABS 0.0 08/07/2019 1105    BMET    Component Value Date/Time   NA 139 07/28/2017 1340   K 4.8 07/28/2017 1340   CL 107 07/28/2017 1340   CO2 24 07/28/2017 1340   GLUCOSE 179 (H) 07/28/2017 1340   BUN 15 07/28/2017 1340   CREATININE 0.89 07/28/2017 1340   CALCIUM 9.3 07/28/2017 1340   GFRNONAA 58 (L) 07/28/2017 1340   GFRAA >60 07/28/2017 1340    BNP No results found for: BNP  ProBNP No results found for: PROBNP  Specialty Problems      Pulmonary Problems   Cough variant asthma vs UACS    Followed in Pulmonary clinic/ [facility]/ [facility] [place] decades prior to initial pulmonary eval 2014  - 10/18/2013  resume trial of dulera 100 2bid  -Med calendar 11/01/2013 > did not bring as instructed 12/04/13 - PFTs wnl 12/04/13 > ok to try off dulera - flare off dulera 08/14/14 > ok to resume dulera 100 2bid  - Spirometry 04/24/2015  wnl off dulera / even fef 25-75 - NO 04/24/2015  = 17  - 11/15/2016  try symbicort 80 2bid  - PFT's  01/06/2017  FEV1 1.59 (101 % ) ratio 86  p 4 % improvement from saba p symb 80 x 2 x 12h  prior to study with DLCO  67/68c % corrects to 100 % for alv volume   -  01/27/2018 cough resolved maint on just 1st gen H1 blockers per guidelines  And off inhalers  - 06/22/2018 flared off dulera > resume   - 07/12/2018   try BREO 100 one am for insurance purposes  - 12/06/2018  After extensive coaching inhaler device,  effectiveness =    90% with elipta > give breo 100 x 2 week sample then return with formulary to see NP/pharmacist for best choice.      Upper airway cough syndrome    Followed in Pulmonary clinic/ [facility]/ [facility]  - Note POS GERD on UGI  02/28/13  - 01/06/2017  added h2 and 1st gen H1 blockers per guidelines  At HS > f/u prn  - 04/21/2018  No  benefit from ICS/LAMA. Symptoms facor UACS. Take protonix 40mg  TWICE daily. Take Chlorpheniramine 4mg  every 4 hours as needed for cough. Refill tessalon perles for cough, continue delsym cough syrup and add mucinex twice a day        Pulmonary nodule    - 2 small pulmonary nodules noted to left upper lobe on recent PET scan (1mm LUL nodule not hypermetabolic). Very low metabolic activity, not consistent with primary source - CT 03/02/19:  1. Scattered ground-glass nodularity bilaterally, greatest in the lower lobes subpleural distribution. Overall, pattern suggest inflammatory or infectious change. 2. The left upper lobe nodules reported on prior PET scan are no longer identified. 3. Likely reactive borderline enlarged mediastinal and right hilar lymph nodes. No frankly pathologic adenopathy.       Allergic rhinitis      Allergies  Allergen Reactions  . Ibuprofen Other (See Comments)    Confusion, Patient states that the 800mg  Motrin made her feel like she was flying.    Immunization History  Administered Date(s) Administered  . Influenza Split 09/18/2013, 10/19/2014, 09/18/2016  . Influenza Whole 10/19/2011, 09/19/2015  . Influenza, High Dose Seasonal PF 10/28/2018  . Influenza,inj,quad, With Preservative 10/18/2017  . Influenza-Unspecified 10/19/2014  . Moderna SARS-COVID-2  Vaccination 06/15/2019, 07/13/2019  . Pneumococcal Conjugate-13 10/28/2018    Past Medical History:  Diagnosis Date  . A-fib ([place])   . Abnormal heart rhythm   . Allergic rhinitis   . Anxiety   . Asthma   . Diabetes mellitus without complication ([place])    Type II  . GERD (gastroesophageal reflux disease)   . Headache   . Hypertension   . Neuromuscular disorder ([place])    Siactic pain in right leg  . Osteopenia   . Rheumatoid arthritis ([place])   . Rotator cuff tear arthropathy    Right  . Vertigo     Tobacco History: Social History   Tobacco Use  Smoking Status Never Smoker  Smokeless Tobacco Never Used   Counseling given: Not Answered   Continue to not smoke  Outpatient Encounter Medications as of 08/07/2019  Medication Sig  . ACCU-CHEK AVIVA PLUS test strip 1 each as directed.  . Accu-Chek Softclix Lancets lancets 1 each by Other route as directed.  Marland Kitchen albuterol (VENTOLIN HFA) 108 (90 Base) MCG/ACT inhaler   . amiodarone (PACERONE) 200 MG tablet Take 1 tablet by mouth daily.  Marland Kitchen apixaban (ELIQUIS) 2.5 MG TABS tablet Take 2.5 mg by mouth 2 (two) times daily.   . benzonatate (TESSALON) 100 MG capsule Take by mouth.  . Blood Glucose Calibration (ACCU-CHEK AVIVA) SOLN   . escitalopram (LEXAPRO) 10 MG tablet Take 10 mg by mouth daily.  . fluticasone furoate-vilanterol (BREO ELLIPTA) 100-25 MCG/INH AEPB Inhale 1 puff into the lungs daily. (Patient taking differently: Inhale 1 puff into the lungs as needed. )  . fluticasone furoate-vilanterol (BREO ELLIPTA) 100-25 MCG/INH AEPB Inhale 1 puff into the lungs daily.  . fluticasone furoate-vilanterol (BREO ELLIPTA) 100-25 MCG/INH AEPB Inhale 1 puff into the lungs daily.  . folic acid (FOLVITE) 1 MG tablet Take 1 mg by mouth every morning.   . montelukast (SINGULAIR) 10 MG tablet   . Multiple Vitamin (MULTIVITAMIN WITH MINERALS) TABS tablet Take 1 tablet by mouth daily.  . nitroGLYCERIN (NITROSTAT) 0.4 MG SL tablet Place 0.4 mg under  the tongue every 5 (five) minutes as needed for chest pain.   . pantoprazole (PROTONIX) 40 MG tablet Take 30- 60 min before your first and last  meals of the day (Patient taking differently: daily. Take 30- 60 min before your first and last meals of the day)  . ticagrelor (BRILINTA) 90 MG TABS tablet Take 90 mg by mouth 2 (two) times daily.   . Vitamin D, Ergocalciferol, (DRISDOL) 1.25 MG (50000 UT) CAPS capsule Take 1 capsule by mouth every Wednesday.   No facility-administered encounter medications on file as of 08/07/2019.     Review of Systems  Review of Systems  Constitutional: Positive for fatigue. Negative for activity change and fever.  HENT: Positive for postnasal drip and rhinorrhea. Negative for sinus pressure, sinus pain and sore throat.   Respiratory: Positive for cough and shortness of breath. Negative for wheezing.   Cardiovascular: Negative for chest pain and palpitations.  Gastrointestinal: Negative for diarrhea, nausea and vomiting.  Musculoskeletal: Negative for arthralgias.  Neurological: Negative for dizziness.  Psychiatric/Behavioral: Negative for sleep disturbance. The patient is not nervous/anxious.      Physical Exam  BP 124/64 (BP Location: Left Arm, Cuff Size: Normal)   Pulse 65   Temp 98.3 F (36.8 C) (Oral)   Ht 5\' 3"  (1.6 m)   Wt 135 lb 12.8 oz (61.6 kg)   SpO2 99%   BMI 24.06 kg/m   Wt Readings from Last 5 Encounters:  08/07/19 135 lb 12.8 oz (61.6 kg)  06/11/19 135 lb 12.8 oz (61.6 kg)  05/31/19 136 lb 8 oz (61.9 kg)  04/06/19 134 lb (60.8 kg)  03/01/19 135 lb 3.2 oz (61.3 kg)    BMI Readings from Last 5 Encounters:  08/07/19 24.06 kg/m  06/11/19 24.06 kg/m  05/31/19 24.18 kg/m  04/06/19 23.74 kg/m  03/01/19 23.95 kg/m     Physical Exam Vitals and nursing note reviewed.  Constitutional:      General: She is not in acute distress.    Appearance: Normal appearance. She is normal weight.  HENT:     Head: Normocephalic and  atraumatic.     Right Ear: Tympanic membrane, ear canal and external ear normal. There is no impacted cerumen.     Left Ear: Tympanic membrane, ear canal and external ear normal. There is no impacted cerumen.     Nose: Congestion and rhinorrhea present.     Mouth/Throat:     Mouth: Mucous membranes are moist.     Pharynx: Oropharynx is clear.  Eyes:     Pupils: Pupils are equal, round, and reactive to light.  Cardiovascular:     Rate and Rhythm: Normal rate and regular rhythm.     Pulses: Normal pulses.     Heart sounds: Normal heart sounds. No murmur heard.   Pulmonary:     Effort: Pulmonary effort is normal. No respiratory distress.     Breath sounds: No decreased air movement. No decreased breath sounds, wheezing or rales.  Musculoskeletal:     Cervical back: Normal range of motion.  Skin:    General: Skin is warm and dry.     Capillary Refill: Capillary refill takes less than 2 seconds.  Neurological:     General: No focal deficit present.     Mental Status: She is alert and oriented to person, place, and time. Mental status is at baseline.     Gait: Gait normal.  Psychiatric:        Mood and Affect: Mood normal.        Behavior: Behavior normal.        Thought Content: Thought content normal.  Judgment: Judgment normal.       Assessment & Plan:   Allergic rhinitis Plan: Continue Singulair Start nasal saline rinses Start a daily antihistamine  Cough variant asthma vs UACS Acute on chronic issue No audible wheeze on exam today Rhinorrhea and nasal congestion seen  Plan: Continue Breo Ellipta 100 Lab work today Chest x-ray due to patient's request Continue Protonix Continue Singulair Start daily antihistamine Start nasal saline rinses We will repeat pulmonary function testing given the fact that patient is having worsening cough per her reporting and her being on methotrexate  Rheumatoid arthritis-On Methotrexate  Plan: Continue follow-up with  rheumatology We will obtain pulmonary function testing  Metastatic squamous cell carcinoma involving lymph node with unknown primary site [facility]) Plan: Continue follow-up with oncology as well as radiation oncology  Abnormal findings on diagnostic imaging of lung Plan: Chest x-ray today Obtain CT imaging as currently planned in August/2021    Return in about 3 months (around 11/07/2019), or if symptoms worsen or fail to improve, for Follow up with Dr. Melvyn Novas, Follow up for FULL PFT - 60 min.   Lauraine Rinne, NP 08/07/2019   This appointment required 36 minutes of patient care (this includes precharting, chart review, review of results, face-to-face care, etc.).

## 2019-08-07 NOTE — Patient Instructions (Addendum)
You were seen today by Lauraine Rinne, NP  for:   1. Cough variant asthma vs UACS  - Pulmonary function test; Future  Chest Xray today   Labs today   Breo Ellipta 100 >>> Take 1 puff daily in the morning right when you wake up >>>Rinse your mouth out after use >>>This is a daily maintenance inhaler, NOT a rescue inhaler >>>Contact our office if you are having difficulties affording or obtaining this medication >>>It is important for you to be able to take this daily and not miss any doses    2. Allergic rhinitis, unspecified seasonality, unspecified trigger  Continue Singulair  Please start taking a daily antihistamine:  >>>choose one of: zyrtec, claritin, allegra, or xyzal  >>>these are over the counter medications  >>>can choose generic option  >>>take daily  >>>this medication helps with allergies, post nasal drip, and cough   Start nasal saline rinses 1-2 times a day  Use distilled water Shake well Get bottle lukewarm like a baby bottle   3. Abnormal findings on diagnostic imaging of lung  Complete CT of chest in August/2021 as planned  4. Rheumatoid arthritis involving hand with positive rheumatoid factor, unspecified laterality (HCC)  Continue methotrexate  Continue follow-up with rheumatology   We recommend today:  Orders Placed This Encounter  Procedures  . DG Chest 2 View    Standing Status:   Future    Standing Expiration Date:   12/08/2019    Order Specific Question:   Reason for Exam (SYMPTOM  OR DIAGNOSIS REQUIRED)    Answer:   cough    Order Specific Question:   Preferred imaging location?    Answer:   Internal    Order Specific Question:   Radiology Contrast Protocol - do NOT remove file path    Answer:   [email or web address]  . IgE    Standing Status:   Future    Standing Expiration Date:   08/06/2020  . CBC with Differential/Platelet    Standing Status:   Future    Standing Expiration Date:    08/06/2020  . Pulmonary function test    Standing Status:   Future    Standing Expiration Date:   08/06/2020    Order Specific Question:   Where should this test be performed?    Answer:   [facility]    Order Specific Question:   Full PFT: includes the following: basic spirometry, spirometry pre & post bronchodilator, diffusion capacity (DLCO), lung volumes    Answer:   Full PFT   Orders Placed This Encounter  Procedures  . DG Chest 2 View  . IgE  . CBC with Differential/Platelet  . Pulmonary function test   No orders of the defined types were placed in this encounter.   Follow Up:    Return in about 3 months (around 11/07/2019), or if symptoms worsen or fail to improve, for Follow up with Dr. Melvyn Novas, Follow up for FULL PFT - 60 min.   Please do your part to reduce the spread of COVID-19:      Reduce your risk of any infection  and COVID19 by using the similar precautions used for avoiding the common cold or flu:  Marland Kitchen Wash your hands often with soap and warm water for at least 20 seconds.  If soap and water are not readily available, use an alcohol-based hand sanitizer with at least 60% alcohol.  . If coughing or sneezing, cover your mouth and nose by coughing or sneezing  into the elbow areas of your shirt or coat, into a tissue or into your sleeve (not your hands). Langley Gauss A MASK when in public  . Avoid shaking hands with others and consider head nods or verbal greetings only. . Avoid touching your eyes, nose, or mouth with unwashed hands.  . Avoid close contact with people who are sick. . Avoid places or events with large numbers of people in one location, like concerts or sporting events. . If you have some symptoms but not all symptoms, continue to monitor at home and seek medical attention if your symptoms worsen. . If you are having a medical emergency, call 911.   [place] / e-Visit:  [email or web address]         [facility]: [facility]: [phone]                   [facility]: [phone]     It is flu season:   >>> Best ways to protect herself from the flu: Receive the yearly flu vaccine, practice good hand hygiene washing with soap and also using hand sanitizer when available, eat a nutritious meals, get adequate rest, hydrate appropriately   Please contact the office if your symptoms worsen or you have concerns that you are not improving.   Thank you for choosing [facility] for your healthcare, and for allowing [place] to partner with you on your healthcare journey. I am thankful to be able to provide care to you today.   Wyn Quaker FNP-C

## 2019-08-07 NOTE — Assessment & Plan Note (Signed)
Plan: Continue Singulair Start nasal saline rinses Start a daily antihistamine

## 2019-08-07 NOTE — Assessment & Plan Note (Addendum)
Acute on chronic issue No audible wheeze on exam today Rhinorrhea and nasal congestion seen  Plan: Continue Breo Ellipta 100 Lab work today Chest x-ray due to patient's request Continue Protonix Continue Singulair Start daily antihistamine Start nasal saline rinses We will repeat pulmonary function testing given the fact that patient is having worsening cough per her reporting and her being on methotrexate

## 2019-08-07 NOTE — Assessment & Plan Note (Signed)
Plan: Continue follow-up with rheumatology We will obtain pulmonary function testing

## 2019-08-08 DIAGNOSIS — E119 Type 2 diabetes mellitus without complications: Secondary | ICD-10-CM | POA: Diagnosis not present

## 2019-08-08 DIAGNOSIS — H04123 Dry eye syndrome of bilateral lacrimal glands: Secondary | ICD-10-CM | POA: Diagnosis not present

## 2019-08-08 DIAGNOSIS — H10413 Chronic giant papillary conjunctivitis, bilateral: Secondary | ICD-10-CM | POA: Diagnosis not present

## 2019-08-08 DIAGNOSIS — Z961 Presence of intraocular lens: Secondary | ICD-10-CM | POA: Diagnosis not present

## 2019-08-08 DIAGNOSIS — H401131 Primary open-angle glaucoma, bilateral, mild stage: Secondary | ICD-10-CM | POA: Diagnosis not present

## 2019-08-08 DIAGNOSIS — H353131 Nonexudative age-related macular degeneration, bilateral, early dry stage: Secondary | ICD-10-CM | POA: Diagnosis not present

## 2019-08-08 LAB — IGE: IgE (Immunoglobulin E), Serum: 31 kU/L (ref <=114)

## 2019-08-09 NOTE — Progress Notes (Signed)
Spoke with pt and notified of results per Brian. Pt verbalized understanding and denied any questions. °

## 2019-08-09 NOTE — Progress Notes (Signed)
Spoke with pt and notified of results per Dr. Aaron Edelman. Pt verbalized understanding and denied any questions.

## 2019-08-15 ENCOUNTER — Other Ambulatory Visit: Payer: Self-pay

## 2019-08-15 ENCOUNTER — Ambulatory Visit: Payer: Medicare HMO | Admitting: Neurology

## 2019-08-15 ENCOUNTER — Encounter: Payer: Self-pay | Admitting: Neurology

## 2019-08-15 VITALS — BP 169/64 | HR 65 | Ht 63.0 in | Wt 137.0 lb

## 2019-08-15 DIAGNOSIS — M792 Neuralgia and neuritis, unspecified: Secondary | ICD-10-CM | POA: Diagnosis not present

## 2019-08-15 DIAGNOSIS — R296 Repeated falls: Secondary | ICD-10-CM | POA: Diagnosis not present

## 2019-08-15 MED ORDER — GABAPENTIN 100 MG PO CAPS: ORAL_CAPSULE | ORAL | 11 refills | Status: DC

## 2019-08-15 NOTE — Progress Notes (Signed)
NEUROLOGY FOLLOW UP OFFICE NOTE  Lorraine Gilbert [id number] [birth year]  HISTORY OF PRESENT ILLNESS: I had the pleasure of seeing Lorraine Gilbert in follow-up in the neurology clinic on 08/15/2019.  The patient was last seen 4 months ago for memory loss and vertigo. She is again accompanied by her daughter-in-law [name] who helps supplement the history today. She presents for an earlier visit for different symptoms. She has had neuropathic pain in both legs which has been worsening. She has seen Vascular Surgery and had arterial and venous duplex scans which showed at least moderate SFA disease bilaterally, as well as bilateral tibial disease. Abdominal aortic ultrasound showed mild iliac disease. Venous doplper showed significant venous reflux bilaterally. Compression stockings were advised. She presents today reporting significant constant burning in both feet going up to her calves. Her hands are numb due to arthritis. She has back pain and was receiving injections. She also has bilateral knee pain. No bowel/bladder dysfunction. She thinks her last HbA1c was 7. Her last TSH was 7.20 but free T4 was normal. She reports frequent falls, she fell twice last week, she was using her cane in [employer] then her left knee gave way. Last fall was last Saturday at home without her cane. Her memory has been stable, "sometimes I forget something." She continues to manage her own medications. There is close family supervision.    History on Initial Assessment 08/20/2014: This is a pleasant 86 yo RH woman with a history of atrial fibrillation on anticoagulation with Eliquis, hypertension, hyperlipidemia, diabetes, anxiety, vitamin D deficiency, polymyalgia rheumatica, who presented for evaluation of worsening memory. She started noticing symptoms over the past few months, mostly with short-term memory, she would forget things, occasionally forget her medications, or get disoriented when driving in unfamiliar places. She has  noticed some word-finding difficulties. There are times she would "feel lost" when in crowded places like the mall last week. She denies any missed bill payments, she cooks and drives without difficulties, no difficulties with ADLs.  She was found to have atrial fibrillation after episodes of syncope 2-3 years ago. She has had brief sharp pains in the occipital region lasting a few seconds occurring around once a week, with associated photophobia, no nausea/vomiting. She has occasional dizziness. She denies any vision changes except for blurred vision from cataracts and glaucoma. She has chronic neck and back pain. She has numbness and tingling in both hands, and occasional pain in her calves. She denies any bowel/bladder dysfunction, no anosmia or tremors. She reports her mother had "the beginning of Alzheimer's" at age 59. She had a head injury when younger, hit her head and was unconscious until she woke up in the hospital. She denies any alcohol intake.  Records and images were personally reviewed where available.  I personally reviewed MRI brain without contrast which did not show any acute changes. There was mild diffuse atrophy and mild chronic microvascular disease. TSH and B12 normal.    PAST MEDICAL HISTORY: Past Medical History:  Diagnosis Date  . A-fib ([place])   . Abnormal heart rhythm   . Allergic rhinitis   . Anxiety   . Asthma   . Diabetes mellitus without complication ([place])    Type II  . GERD (gastroesophageal reflux disease)   . Headache   . Hypertension   . Neuromuscular disorder ([place])    Siactic pain in right leg  . Osteopenia   . Rheumatoid arthritis ([place])   . Rotator cuff tear arthropathy  Right  . Vertigo     MEDICATIONS: Current Outpatient Medications on File Prior to Visit  Medication Sig Dispense Refill  . ACCU-CHEK AVIVA PLUS test strip 1 each as directed.    . Accu-Chek Softclix Lancets lancets 1 each by Other route as directed.    Marland Kitchen albuterol (VENTOLIN  HFA) 108 (90 Base) MCG/ACT inhaler     . amiodarone (PACERONE) 200 MG tablet Take 1 tablet by mouth daily.    . benzonatate (TESSALON) 100 MG capsule Take by mouth.    . Blood Glucose Calibration (ACCU-CHEK AVIVA) SOLN     . escitalopram (LEXAPRO) 10 MG tablet Take 10 mg by mouth daily.    . fluticasone furoate-vilanterol (BREO ELLIPTA) 100-25 MCG/INH AEPB Inhale 1 puff into the lungs daily. (Patient taking differently: Inhale 1 puff into the lungs as needed. ) 14 each 0  . fluticasone furoate-vilanterol (BREO ELLIPTA) 100-25 MCG/INH AEPB Inhale 1 puff into the lungs daily. 14 each 0  . fluticasone furoate-vilanterol (BREO ELLIPTA) 100-25 MCG/INH AEPB Inhale 1 puff into the lungs daily. 28 each 0  . folic acid (FOLVITE) 1 MG tablet Take 1 mg by mouth every morning.     . montelukast (SINGULAIR) 10 MG tablet     . Multiple Vitamin (MULTIVITAMIN WITH MINERALS) TABS tablet Take 1 tablet by mouth daily.    . nitroGLYCERIN (NITROSTAT) 0.4 MG SL tablet Place 0.4 mg under the tongue every 5 (five) minutes as needed for chest pain.     . pantoprazole (PROTONIX) 40 MG tablet Take 30- 60 min before your first and last meals of the day (Patient taking differently: daily. Take 30- 60 min before your first and last meals of the day) 60 tablet 2  . ticagrelor (BRILINTA) 90 MG TABS tablet Take 90 mg by mouth 2 (two) times daily.     . Vitamin D, Ergocalciferol, (DRISDOL) 1.25 MG (50000 UT) CAPS capsule Take 1 capsule by mouth every Wednesday.    Marland Kitchen apixaban (ELIQUIS) 2.5 MG TABS tablet Take 2.5 mg by mouth 2 (two) times daily.      No current facility-administered medications on file prior to visit.    ALLERGIES: Allergies  Allergen Reactions  . Ibuprofen Other (See Comments)    Confusion, Patient states that the 800mg  Motrin made her feel like she was flying.    FAMILY HISTORY: Family History  Problem Relation Age of Onset  . Emphysema Father        smoked  . Heart disease Mother   . Heart attack  Son   . Breast cancer Neg Hx     SOCIAL HISTORY: Social History   Socioeconomic History  . Marital status: Married    Spouse name: Kathi Ludwig  . Number of children: 6  . Years of education: Not on file  . Highest education level: Not on file  Occupational History  . Occupation: Retired Event organiser  Tobacco Use  . Smoking status: Never Smoker  . Smokeless tobacco: Never Used  Vaping Use  . Vaping Use: Never used  Substance and Sexual Activity  . Alcohol use: No    Alcohol/week: 0.0 standard drinks  . Drug use: No  . Sexual activity: Not on file  Other Topics Concern  . Not on file  Social History Narrative   Right Handed   Lives with husband and son   One Story Home   Caffeine, Yes drinks coffee    Social Determinants of Health   Financial Resource Strain:   .  Difficulty of Paying Living Expenses:   Food Insecurity:   . Worried About Charity fundraiser in the Last Year:   . Arboriculturist in the Last Year:   Transportation Needs:   . Film/video editor (Medical):   Marland Kitchen Lack of Transportation (Non-Medical):   Physical Activity:   . Days of Exercise per Week:   . Minutes of Exercise per Session:   Stress:   . Feeling of Stress :   Social Connections:   . Frequency of Communication with Friends and Family:   . Frequency of Social Gatherings with Friends and Family:   . Attends Religious Services:   . Active Member of Clubs or Organizations:   . Attends Archivist Meetings:   Marland Kitchen Marital Status:   Intimate Partner Violence:   . Fear of Current or Ex-Partner:   . Emotionally Abused:   Marland Kitchen Physically Abused:   . Sexually Abused:       PHYSICAL EXAM: Vitals:   08/15/19 0824  BP: (!) 169/64  Pulse: 65  SpO2: 99%   General: No acute distress Head:  Normocephalic/atraumatic Skin/Extremities: No rash, no edema Neurological Exam: alert and oriented to person, place, and time. No aphasia or dysarthria. Fund of knowledge is appropriate.  Recent  and remote memory are intact.  Attention and concentration are normal.    Cranial nerves: Pupils equal, round. Extraocular movements intact with no nystagmus. No facial asymmetry.  Motor: Bulk and tone normal, muscle strength 5/5 throughout with no pronator drift.  Sensation to light touch, temperature, pin on both UE. Decreased cold to ankles bilaterally, decrease pin on left LE, intact on right. Decreased vibration to right ankle, left knee. Deep tendon reflexes +1 throughout, toes downgoing.  Finger to nose testing intact.  Gait slow and cautious, also reporting left knee pain. Negative Romberg test.   IMPRESSION: This is a pleasant 86 yo RH woman with vascular risk factors including hypertension, hyperlipidemia, diabetes, atrial fibrillation on Eliquis, PMR, mild dementia, presenting for earlier visit due to burning pain in both feet. She has had several falls. Exam today shows evidence of a length dependent neuropathy, worse on the left. EMG/NCV of both legs will be ordered to further evaluate her symptoms. Vascular studies have also shown at least moderate SFA disease bilaterally, as well as bilateral tibial disease. Venous doplper showed significant venous reflux bilaterally. We discussed symptomatic treatment with gabapentin, we will start low dose 100mg  qhs x 1 week, and if no side effects, increase to 100mg  BID. Side effects discussed. Falls likely multifactorial from neuropathy and musculoskeletal issues, she will be referred for home balance therapy, advised to use cane at all times. Follow-up in 4 months, they know to call for any changes.   Thank you for allowing me to participate in her care.  Please do not hesitate to call for any questions or concerns.   Ellouise Newer, M.D.   CC: Dr. Moreen Fowler

## 2019-08-15 NOTE — Patient Instructions (Addendum)
1. Start Gabapentin 100mg : Take 1 capsule every night for 1 week, then increase to 1 cap twice a day. Monitor for drowsiness, recommend supervision at home when starting the medication  2. A referral for home PT for balance therapy will be sent today. Use cane at all times.  3. Schedule EMG/NCV of both lower extremities with Dr. Posey Pronto  4. Follow-up in 4 months, call for any changes.

## 2019-08-18 DIAGNOSIS — C801 Malignant (primary) neoplasm, unspecified: Secondary | ICD-10-CM | POA: Diagnosis not present

## 2019-08-18 DIAGNOSIS — C779 Secondary and unspecified malignant neoplasm of lymph node, unspecified: Secondary | ICD-10-CM | POA: Diagnosis not present

## 2019-08-18 DIAGNOSIS — I4891 Unspecified atrial fibrillation: Secondary | ICD-10-CM | POA: Diagnosis not present

## 2019-08-18 DIAGNOSIS — J309 Allergic rhinitis, unspecified: Secondary | ICD-10-CM | POA: Diagnosis not present

## 2019-08-18 DIAGNOSIS — E785 Hyperlipidemia, unspecified: Secondary | ICD-10-CM | POA: Diagnosis not present

## 2019-08-18 DIAGNOSIS — E1140 Type 2 diabetes mellitus with diabetic neuropathy, unspecified: Secondary | ICD-10-CM | POA: Diagnosis not present

## 2019-08-18 DIAGNOSIS — F0390 Unspecified dementia without behavioral disturbance: Secondary | ICD-10-CM | POA: Diagnosis not present

## 2019-08-18 DIAGNOSIS — I10 Essential (primary) hypertension: Secondary | ICD-10-CM | POA: Diagnosis not present

## 2019-08-18 DIAGNOSIS — M06049 Rheumatoid arthritis without rheumatoid factor, unspecified hand: Secondary | ICD-10-CM | POA: Diagnosis not present

## 2019-08-20 ENCOUNTER — Telehealth: Payer: Self-pay | Admitting: Neurology

## 2019-08-20 NOTE — Telephone Encounter (Signed)
Lorraine Gilbert, the patient's physical therapist recently saw the patient and would like a verbal order for 2 times a week for 2 weeks and then 1 time a week for 3 weeks.

## 2019-08-20 NOTE — Telephone Encounter (Signed)
Ok for verbal ,thanks 

## 2019-08-20 NOTE — Telephone Encounter (Signed)
physical therapist called with  verbal orders for 2 times a week for 2 weeks and then 1 time a week for 3 weeks.

## 2019-08-21 DIAGNOSIS — C779 Secondary and unspecified malignant neoplasm of lymph node, unspecified: Secondary | ICD-10-CM | POA: Diagnosis not present

## 2019-08-21 DIAGNOSIS — F0390 Unspecified dementia without behavioral disturbance: Secondary | ICD-10-CM | POA: Diagnosis not present

## 2019-08-21 DIAGNOSIS — I10 Essential (primary) hypertension: Secondary | ICD-10-CM | POA: Diagnosis not present

## 2019-08-21 DIAGNOSIS — C801 Malignant (primary) neoplasm, unspecified: Secondary | ICD-10-CM | POA: Diagnosis not present

## 2019-08-21 DIAGNOSIS — E785 Hyperlipidemia, unspecified: Secondary | ICD-10-CM | POA: Diagnosis not present

## 2019-08-21 DIAGNOSIS — M06049 Rheumatoid arthritis without rheumatoid factor, unspecified hand: Secondary | ICD-10-CM | POA: Diagnosis not present

## 2019-08-21 DIAGNOSIS — I4891 Unspecified atrial fibrillation: Secondary | ICD-10-CM | POA: Diagnosis not present

## 2019-08-21 DIAGNOSIS — E1140 Type 2 diabetes mellitus with diabetic neuropathy, unspecified: Secondary | ICD-10-CM | POA: Diagnosis not present

## 2019-08-21 DIAGNOSIS — J309 Allergic rhinitis, unspecified: Secondary | ICD-10-CM | POA: Diagnosis not present

## 2019-08-22 ENCOUNTER — Other Ambulatory Visit: Payer: Self-pay

## 2019-08-22 ENCOUNTER — Ambulatory Visit (INDEPENDENT_AMBULATORY_CARE_PROVIDER_SITE_OTHER): Payer: Medicare HMO | Admitting: Neurology

## 2019-08-22 DIAGNOSIS — M792 Neuralgia and neuritis, unspecified: Secondary | ICD-10-CM | POA: Diagnosis not present

## 2019-08-22 DIAGNOSIS — R296 Repeated falls: Secondary | ICD-10-CM

## 2019-08-22 DIAGNOSIS — G629 Polyneuropathy, unspecified: Secondary | ICD-10-CM

## 2019-08-22 NOTE — Procedures (Signed)
[facility] Neurology  [place]  [place], [place] [address] Tel: [phone] Fax:  [phone] Test Date:  08/22/2019  Patient: Lorraine Gilbert DOB: 07-25-1932 Physician: Narda Amber, DO  Sex: Female Height: 5\' 3"  Ref Phys: Ellouise Newer, M.D.  ID#: [id number] Temp; 33.0C Technician:    Patient Complaints: This is a 86 year old female referred for evaluation of bilateral feet pain and paresthesias.  NCV & EMG Findings: Extensive electrodiagnostic testing of the right lower extremity and additional studies of the left shows:  1. Bilateral superficial peroneal sensory responses are absent.  Bilateral sural sensory responses are within normal limits. 2. Bilateral peroneal and tibial motor responses are within normal limits. 3. Bilateral tibial H reflex studies are mildly prolonged. 4. Needle electrode examination is limited as the patient is on anticoagulation therapy.  None of the tested muscles show evidence of active or chronic motor axonal loss changes.   Impression: The electrophysiologic findings are consistent with a sensory polyneuropathy affecting the lower extremities, moderate in degree electrically.  These findings are new compared to prior study dated 07/07/2017.   ___________________________ Narda Amber, DO    Nerve Conduction Studies Anti Sensory Summary Table   Stim Site NR Peak (ms) Norm Peak (ms) P-T Amp (V) Norm P-T Amp  Left Sup Peroneal Anti Sensory (Ant Lat Mall)  33C  12 cm NR  <4.6  >3  Right Sup Peroneal Anti Sensory (Ant Lat Mall)  33C  12 cm NR  <4.6  >3  Left Sural Anti Sensory (Lat Mall)  33C  Calf    3.2 <4.6 5.7 >3  Right Sural Anti Sensory (Lat Mall)  33C  Calf    2.7 <4.6 5.2 >3   Motor Summary Table   Stim Site NR Onset (ms) Norm Onset (ms) O-P Amp (mV) Norm O-P Amp Site1 Site2 Delta-0 (ms) Dist (cm) Vel (m/s) Norm Vel (m/s)  Left Peroneal Motor (Ext Dig Brev)  33C  Ankle    3.9 <6.0 3.6 >2.5 B Fib Ankle 7.2 33.0 46  >40  B Fib    11.1  3.3  Poplt B Fib 1.7 7.0 41 >40  Poplt    12.8  3.3         Right Peroneal Motor (Ext Dig Brev)  33C  Ankle    3.4 <6.0 2.5 >2.5 B Fib Ankle 7.6 33.0 43 >40  B Fib    11.0  2.2  Poplt B Fib 1.7 7.0 41 >40  Poplt    12.7  2.1         Left Tibial Motor (Abd Hall Brev)  33C  Ankle    3.3 <6.0 5.7 >4 Knee Ankle 8.4 38.0 45 >40  Knee    11.7  5.1         Right Tibial Motor (Abd Hall Brev)  33C  Ankle    4.5 <6.0 4.7 >4 Knee Ankle 8.4 38.0 45 >40  Knee    12.9  3.1          H Reflex Studies   NR H-Lat (ms) Lat Norm (ms) L-R H-Lat (ms)  Left Tibial (Gastroc)  33C     37.28 <35 1.09  Right Tibial (G[place])  33C     36.19 <35 1.09   EMG   Side Muscle Ins Act Fibs Psw Fasc Number Recrt Dur Dur. Amp Amp. Poly Poly. Comment  Left AntTibialis Nml Nml Nml Nml Nml Nml Nml Nml Nml Nml Nml Nml N/A  Left  Gastroc Nml Nml Nml Nml Nml Nml Nml Nml Nml Nml Nml Nml N/A  Left RectFemoris Nml Nml Nml Nml Nml Nml Nml Nml Nml Nml Nml Nml N/A  Right AntTibialis Nml Nml Nml Nml Nml Nml Nml Nml Nml Nml Nml Nml N/A  Right Gastroc Nml Nml Nml Nml Nml Nml Nml Nml Nml Nml Nml Nml N/A  Right RectFemoris Nml Nml Nml Nml Nml Nml Nml Nml Nml Nml Nml Nml N/A      Waveforms:

## 2019-08-23 DIAGNOSIS — F0390 Unspecified dementia without behavioral disturbance: Secondary | ICD-10-CM | POA: Diagnosis not present

## 2019-08-23 DIAGNOSIS — E785 Hyperlipidemia, unspecified: Secondary | ICD-10-CM | POA: Diagnosis not present

## 2019-08-23 DIAGNOSIS — J300 Vasomotor rhinitis: Secondary | ICD-10-CM | POA: Diagnosis not present

## 2019-08-23 DIAGNOSIS — C779 Secondary and unspecified malignant neoplasm of lymph node, unspecified: Secondary | ICD-10-CM | POA: Diagnosis not present

## 2019-08-23 DIAGNOSIS — J309 Allergic rhinitis, unspecified: Secondary | ICD-10-CM | POA: Diagnosis not present

## 2019-08-23 DIAGNOSIS — I10 Essential (primary) hypertension: Secondary | ICD-10-CM | POA: Diagnosis not present

## 2019-08-23 DIAGNOSIS — R05 Cough: Secondary | ICD-10-CM | POA: Diagnosis not present

## 2019-08-23 DIAGNOSIS — I4891 Unspecified atrial fibrillation: Secondary | ICD-10-CM | POA: Diagnosis not present

## 2019-08-23 DIAGNOSIS — E1140 Type 2 diabetes mellitus with diabetic neuropathy, unspecified: Secondary | ICD-10-CM | POA: Diagnosis not present

## 2019-08-23 DIAGNOSIS — M06049 Rheumatoid arthritis without rheumatoid factor, unspecified hand: Secondary | ICD-10-CM | POA: Diagnosis not present

## 2019-08-23 DIAGNOSIS — C801 Malignant (primary) neoplasm, unspecified: Secondary | ICD-10-CM | POA: Diagnosis not present

## 2019-08-23 DIAGNOSIS — J4540 Moderate persistent asthma, uncomplicated: Secondary | ICD-10-CM | POA: Diagnosis not present

## 2019-08-27 ENCOUNTER — Telehealth: Payer: Self-pay

## 2019-08-27 NOTE — Telephone Encounter (Signed)
-----   Message from Cameron Sprang, MD sent at 08/27/2019  9:50 AM EDT ----- Pls let patient/daughter-in-law know that the nerve test confirms neuropathy. How is she feeling on the gabapentin, is it helping with the burning? We can increase dose if no side effects. Thanks

## 2019-08-27 NOTE — Telephone Encounter (Signed)
Called and spoke to patient. Informed patient of results. Asked patient how Gabapentin is helping with the burning and patient stated it has been helping with the burning and pain. She stated that she will give [place] a call back if she feels the dose needs to be increased.

## 2019-08-28 DIAGNOSIS — F0390 Unspecified dementia without behavioral disturbance: Secondary | ICD-10-CM | POA: Diagnosis not present

## 2019-08-28 DIAGNOSIS — I10 Essential (primary) hypertension: Secondary | ICD-10-CM | POA: Diagnosis not present

## 2019-08-28 DIAGNOSIS — C779 Secondary and unspecified malignant neoplasm of lymph node, unspecified: Secondary | ICD-10-CM | POA: Diagnosis not present

## 2019-08-28 DIAGNOSIS — C801 Malignant (primary) neoplasm, unspecified: Secondary | ICD-10-CM | POA: Diagnosis not present

## 2019-08-28 DIAGNOSIS — R159 Full incontinence of feces: Secondary | ICD-10-CM | POA: Diagnosis not present

## 2019-08-28 DIAGNOSIS — I4891 Unspecified atrial fibrillation: Secondary | ICD-10-CM | POA: Diagnosis not present

## 2019-08-28 DIAGNOSIS — E1140 Type 2 diabetes mellitus with diabetic neuropathy, unspecified: Secondary | ICD-10-CM | POA: Diagnosis not present

## 2019-08-28 DIAGNOSIS — J309 Allergic rhinitis, unspecified: Secondary | ICD-10-CM | POA: Diagnosis not present

## 2019-08-28 DIAGNOSIS — E785 Hyperlipidemia, unspecified: Secondary | ICD-10-CM | POA: Diagnosis not present

## 2019-08-28 DIAGNOSIS — M06049 Rheumatoid arthritis without rheumatoid factor, unspecified hand: Secondary | ICD-10-CM | POA: Diagnosis not present

## 2019-08-30 DIAGNOSIS — E1140 Type 2 diabetes mellitus with diabetic neuropathy, unspecified: Secondary | ICD-10-CM | POA: Diagnosis not present

## 2019-08-30 DIAGNOSIS — I4891 Unspecified atrial fibrillation: Secondary | ICD-10-CM | POA: Diagnosis not present

## 2019-08-30 DIAGNOSIS — J309 Allergic rhinitis, unspecified: Secondary | ICD-10-CM | POA: Diagnosis not present

## 2019-08-30 DIAGNOSIS — C779 Secondary and unspecified malignant neoplasm of lymph node, unspecified: Secondary | ICD-10-CM | POA: Diagnosis not present

## 2019-08-30 DIAGNOSIS — I10 Essential (primary) hypertension: Secondary | ICD-10-CM | POA: Diagnosis not present

## 2019-08-30 DIAGNOSIS — F0390 Unspecified dementia without behavioral disturbance: Secondary | ICD-10-CM | POA: Diagnosis not present

## 2019-08-30 DIAGNOSIS — M06049 Rheumatoid arthritis without rheumatoid factor, unspecified hand: Secondary | ICD-10-CM | POA: Diagnosis not present

## 2019-08-30 DIAGNOSIS — C801 Malignant (primary) neoplasm, unspecified: Secondary | ICD-10-CM | POA: Diagnosis not present

## 2019-08-30 DIAGNOSIS — E785 Hyperlipidemia, unspecified: Secondary | ICD-10-CM | POA: Diagnosis not present

## 2019-09-04 DIAGNOSIS — C801 Malignant (primary) neoplasm, unspecified: Secondary | ICD-10-CM | POA: Diagnosis not present

## 2019-09-04 DIAGNOSIS — J309 Allergic rhinitis, unspecified: Secondary | ICD-10-CM | POA: Diagnosis not present

## 2019-09-04 DIAGNOSIS — M06049 Rheumatoid arthritis without rheumatoid factor, unspecified hand: Secondary | ICD-10-CM | POA: Diagnosis not present

## 2019-09-04 DIAGNOSIS — I10 Essential (primary) hypertension: Secondary | ICD-10-CM | POA: Diagnosis not present

## 2019-09-04 DIAGNOSIS — E785 Hyperlipidemia, unspecified: Secondary | ICD-10-CM | POA: Diagnosis not present

## 2019-09-04 DIAGNOSIS — I4891 Unspecified atrial fibrillation: Secondary | ICD-10-CM | POA: Diagnosis not present

## 2019-09-04 DIAGNOSIS — F0390 Unspecified dementia without behavioral disturbance: Secondary | ICD-10-CM | POA: Diagnosis not present

## 2019-09-04 DIAGNOSIS — C779 Secondary and unspecified malignant neoplasm of lymph node, unspecified: Secondary | ICD-10-CM | POA: Diagnosis not present

## 2019-09-04 DIAGNOSIS — E1140 Type 2 diabetes mellitus with diabetic neuropathy, unspecified: Secondary | ICD-10-CM | POA: Diagnosis not present

## 2019-09-11 DIAGNOSIS — I4891 Unspecified atrial fibrillation: Secondary | ICD-10-CM | POA: Diagnosis not present

## 2019-09-11 DIAGNOSIS — C801 Malignant (primary) neoplasm, unspecified: Secondary | ICD-10-CM | POA: Diagnosis not present

## 2019-09-11 DIAGNOSIS — F0390 Unspecified dementia without behavioral disturbance: Secondary | ICD-10-CM | POA: Diagnosis not present

## 2019-09-11 DIAGNOSIS — C779 Secondary and unspecified malignant neoplasm of lymph node, unspecified: Secondary | ICD-10-CM | POA: Diagnosis not present

## 2019-09-11 DIAGNOSIS — J309 Allergic rhinitis, unspecified: Secondary | ICD-10-CM | POA: Diagnosis not present

## 2019-09-11 DIAGNOSIS — E1140 Type 2 diabetes mellitus with diabetic neuropathy, unspecified: Secondary | ICD-10-CM | POA: Diagnosis not present

## 2019-09-11 DIAGNOSIS — E785 Hyperlipidemia, unspecified: Secondary | ICD-10-CM | POA: Diagnosis not present

## 2019-09-11 DIAGNOSIS — M06049 Rheumatoid arthritis without rheumatoid factor, unspecified hand: Secondary | ICD-10-CM | POA: Diagnosis not present

## 2019-09-11 DIAGNOSIS — I10 Essential (primary) hypertension: Secondary | ICD-10-CM | POA: Diagnosis not present

## 2019-09-12 ENCOUNTER — Telehealth: Payer: Self-pay | Admitting: Neurology

## 2019-09-12 DIAGNOSIS — Z96611 Presence of right artificial shoulder joint: Secondary | ICD-10-CM | POA: Diagnosis not present

## 2019-09-12 DIAGNOSIS — Z471 Aftercare following joint replacement surgery: Secondary | ICD-10-CM | POA: Diagnosis not present

## 2019-09-12 NOTE — Telephone Encounter (Signed)
Dianna, a physical therapist with [facility] called in wanting to extend the physical therapy with the patient. Beginning September 6th to continue with balance training.

## 2019-09-13 NOTE — Telephone Encounter (Signed)
Yes please, thanks

## 2019-09-17 ENCOUNTER — Ambulatory Visit: Payer: Medicare HMO | Admitting: Internal Medicine

## 2019-09-20 DIAGNOSIS — I10 Essential (primary) hypertension: Secondary | ICD-10-CM | POA: Diagnosis not present

## 2019-09-20 DIAGNOSIS — C779 Secondary and unspecified malignant neoplasm of lymph node, unspecified: Secondary | ICD-10-CM | POA: Diagnosis not present

## 2019-09-20 DIAGNOSIS — I4891 Unspecified atrial fibrillation: Secondary | ICD-10-CM | POA: Diagnosis not present

## 2019-09-20 DIAGNOSIS — E785 Hyperlipidemia, unspecified: Secondary | ICD-10-CM | POA: Diagnosis not present

## 2019-09-20 DIAGNOSIS — J309 Allergic rhinitis, unspecified: Secondary | ICD-10-CM | POA: Diagnosis not present

## 2019-09-20 DIAGNOSIS — F0390 Unspecified dementia without behavioral disturbance: Secondary | ICD-10-CM | POA: Diagnosis not present

## 2019-09-20 DIAGNOSIS — M06049 Rheumatoid arthritis without rheumatoid factor, unspecified hand: Secondary | ICD-10-CM | POA: Diagnosis not present

## 2019-09-20 DIAGNOSIS — C801 Malignant (primary) neoplasm, unspecified: Secondary | ICD-10-CM | POA: Diagnosis not present

## 2019-09-20 DIAGNOSIS — E1140 Type 2 diabetes mellitus with diabetic neuropathy, unspecified: Secondary | ICD-10-CM | POA: Diagnosis not present

## 2019-09-25 DIAGNOSIS — M159 Polyosteoarthritis, unspecified: Secondary | ICD-10-CM | POA: Diagnosis not present

## 2019-09-25 DIAGNOSIS — M353 Polymyalgia rheumatica: Secondary | ICD-10-CM | POA: Diagnosis not present

## 2019-09-25 DIAGNOSIS — Z79899 Other long term (current) drug therapy: Secondary | ICD-10-CM | POA: Diagnosis not present

## 2019-09-25 DIAGNOSIS — M2550 Pain in unspecified joint: Secondary | ICD-10-CM | POA: Diagnosis not present

## 2019-09-25 DIAGNOSIS — Z6824 Body mass index (BMI) 24.0-24.9, adult: Secondary | ICD-10-CM | POA: Diagnosis not present

## 2019-09-25 DIAGNOSIS — M5410 Radiculopathy, site unspecified: Secondary | ICD-10-CM | POA: Diagnosis not present

## 2019-09-25 DIAGNOSIS — M0579 Rheumatoid arthritis with rheumatoid factor of multiple sites without organ or systems involvement: Secondary | ICD-10-CM | POA: Diagnosis not present

## 2019-09-26 DIAGNOSIS — C779 Secondary and unspecified malignant neoplasm of lymph node, unspecified: Secondary | ICD-10-CM | POA: Diagnosis not present

## 2019-09-26 DIAGNOSIS — E1140 Type 2 diabetes mellitus with diabetic neuropathy, unspecified: Secondary | ICD-10-CM | POA: Diagnosis not present

## 2019-09-26 DIAGNOSIS — I4891 Unspecified atrial fibrillation: Secondary | ICD-10-CM | POA: Diagnosis not present

## 2019-09-26 DIAGNOSIS — E785 Hyperlipidemia, unspecified: Secondary | ICD-10-CM | POA: Diagnosis not present

## 2019-09-26 DIAGNOSIS — J309 Allergic rhinitis, unspecified: Secondary | ICD-10-CM | POA: Diagnosis not present

## 2019-09-26 DIAGNOSIS — C801 Malignant (primary) neoplasm, unspecified: Secondary | ICD-10-CM | POA: Diagnosis not present

## 2019-09-26 DIAGNOSIS — M06049 Rheumatoid arthritis without rheumatoid factor, unspecified hand: Secondary | ICD-10-CM | POA: Diagnosis not present

## 2019-09-26 DIAGNOSIS — F0390 Unspecified dementia without behavioral disturbance: Secondary | ICD-10-CM | POA: Diagnosis not present

## 2019-09-26 DIAGNOSIS — I10 Essential (primary) hypertension: Secondary | ICD-10-CM | POA: Diagnosis not present

## 2019-10-02 DIAGNOSIS — I4891 Unspecified atrial fibrillation: Secondary | ICD-10-CM | POA: Diagnosis not present

## 2019-10-02 DIAGNOSIS — C801 Malignant (primary) neoplasm, unspecified: Secondary | ICD-10-CM | POA: Diagnosis not present

## 2019-10-02 DIAGNOSIS — J309 Allergic rhinitis, unspecified: Secondary | ICD-10-CM | POA: Diagnosis not present

## 2019-10-02 DIAGNOSIS — E785 Hyperlipidemia, unspecified: Secondary | ICD-10-CM | POA: Diagnosis not present

## 2019-10-02 DIAGNOSIS — F0390 Unspecified dementia without behavioral disturbance: Secondary | ICD-10-CM | POA: Diagnosis not present

## 2019-10-02 DIAGNOSIS — M06049 Rheumatoid arthritis without rheumatoid factor, unspecified hand: Secondary | ICD-10-CM | POA: Diagnosis not present

## 2019-10-02 DIAGNOSIS — E1140 Type 2 diabetes mellitus with diabetic neuropathy, unspecified: Secondary | ICD-10-CM | POA: Diagnosis not present

## 2019-10-02 DIAGNOSIS — C779 Secondary and unspecified malignant neoplasm of lymph node, unspecified: Secondary | ICD-10-CM | POA: Diagnosis not present

## 2019-10-02 DIAGNOSIS — I10 Essential (primary) hypertension: Secondary | ICD-10-CM | POA: Diagnosis not present

## 2019-10-08 ENCOUNTER — Telehealth: Payer: Self-pay | Admitting: Internal Medicine

## 2019-10-08 NOTE — Telephone Encounter (Signed)
Tried calling the pt and there was no answer- LMTCB and will re-route back to triage marked urgent this time

## 2019-10-08 NOTE — Telephone Encounter (Signed)
Sorry to hear she is coughing , can use mucinex DM Twice daily  As needed  Cough/congestion , if not improving will need visit for further evaluation  Will also need to screen for covid   Has not kept follow up with Dr. Melvyn Novas  , has seen Paolo Okane and Warner Mccreedy last.   Derrek Monaco contact office for sooner follow up if symptoms do not improve or worsen or seek emergency care

## 2019-10-08 NOTE — Telephone Encounter (Signed)
Spoke with the pt and notified of response per TP and she verbalized understanding  Will go to [facility] and get the covid test  Will take mucinex and call if not improving

## 2019-10-08 NOTE — Telephone Encounter (Signed)
Primary Pulmonologist: Dr.Wert Last office visit and with whom:06/11/2019 Tammy P  What do we see them for (pulmonary problems):  Asthma Last OV assessment/plan:   Assessment & Plan:   Cough variant asthma vs UACS Asthma appears to be under control patient has no flare of cough or wheezing.   Plan  Patient Instructions  Continue on BREO 1 puff daily , rinse after use.  Follow up with Cardiology as planned.  Follow up with Dr. Melvyn Novas in 3 months and As needed   Please contact office for sooner follow up if symptoms do not improve or worsen or seek emergency care       Claudication of both lower extremities [facility]) Patient appears to be having claudication pain in the lower extremities.  He is she is following up with cardiology and has arterial and venous Dopplers planned for later this week.  Continue follow-up.  Pulmonary nodule Abnormal CT chest with pulmonary nodules.  PET scan with very low metabolic activity.  Will need to continue to follow.  Previous left upper lobe nodule was not noted on follow-up CT chest in February 2021.  Continues to have some groundglass nodularity with a subpleural distribution. Recommend follow-up CT at 6 months  Metastatic squamous cell carcinoma involving lymph node with unknown primary site [facility]) Metastatic squamous cell carcinoma involving left inguinal lymph node status post XRT in June 2020.  Continue follow-up with oncology and radiation oncology     Rexene Edison, NP      Assessment & Plan Note by Melvenia Needles, NP at 06/12/2019 1:46 PM Author: Melvenia Needles, NP Author Type: Nurse Practitioner Filed: 06/12/2019 1:46 PM  Note Status: Written Cosign: Cosign Not Required Encounter Date: 06/11/2019  Problem: Metastatic squamous cell carcinoma involving lymph node with unknown primary site [facility])  Editor: Melvenia Needles, NP (Nurse Practitioner)               Metastatic squamous cell carcinoma involving left inguinal lymph  node status post XRT in June 2020.  Continue follow-up with oncology and radiation oncology    Patient Instructions by Melvenia Needles, NP at 06/11/2019 4:30 PM Author: Melvenia Needles, NP Author Type: Nurse Practitioner Filed: 06/12/2019 1:33 PM  Note Status: Addendum Cosign: Cosign Not Required Encounter Date: 06/11/2019  Editor: Melvenia Needles, NP (Nurse Practitioner)      Prior Versions: 1. Parrett, Fonnie Mu, NP (Nurse Practitioner) at 06/11/2019 4:51 PM - Signed    Continue on BREO 1 puff daily , rinse after use.  Follow up with Cardiology as planned.  Follow up with Dr. Melvyn Novas in 3 months and As needed   Please contact office for sooner follow up if symptoms do not improve or worsen or seek emergency care   Late add : CT chest w/o contrast in 6 months.     Assessment & Plan Note by Melvenia Needles, NP at 06/12/2019 1:31 PM Author: Melvenia Needles, NP Author Type: Nurse Practitioner Filed: 06/12/2019 1:33 PM  Note Status: Written Cosign: Cosign Not Required Encounter Date: 06/11/2019  Problem: Pulmonary nodule  Editor: Melvenia Needles, NP (Nurse Practitioner)               Abnormal CT chest with pulmonary nodules.  PET scan with very low metabolic activity.  Will need to continue to follow.  Previous left upper lobe nodule was not noted on follow-up CT chest in February 2021.  Continues to have some groundglass nodularity with a subpleural distribution.  Recommend follow-up CT at 6 months    Assessment & Plan Note by Melvenia Needles, NP at 06/12/2019 1:30 PM Author: Melvenia Needles, NP Author Type: Nurse Practitioner Filed: 06/12/2019 1:31 PM  Note Status: Written Cosign: Cosign Not Required Encounter Date: 06/11/2019  Problem: Claudication of both lower extremities ([place])  Editor: Melvenia Needles, NP (Nurse Practitioner)               Patient appears to be having claudication pain in the lower extremities.  He is she is following up with cardiology and has arterial and venous  Dopplers planned for later this week.  Continue follow-up.    Assessment & Plan Note by Melvenia Needles, NP at 06/12/2019 1:28 PM Author: Melvenia Needles, NP Author Type: Nurse Practitioner Filed: 06/12/2019 1:29 PM  Note Status: Written Cosign: Cosign Not Required Encounter Date: 06/11/2019  Problem: Cough variant asthma vs UACS  Editor: Melvenia Needles, NP (Nurse Practitioner)               Asthma appears to be under control patient has no flare of cough or wheezing.   Plan  Patient Instructions  Continue on BREO 1 puff daily , rinse after use.  Follow up with Cardiology as planned.  Follow up with Dr. Melvyn Novas in 3 months and As needed   Please contact office for sooner follow up if symptoms do not improve or worsen or seek emergency care         Instructions  Continue on BREO 1 puff daily , rinse after use.  Follow up with Cardiology as planned.  Follow up with Dr. Melvyn Novas in 3 months and As needed   Please contact office for sooner follow up if symptoms do not improve or worsen or seek emergency care   Late add : CT chest w/o contrast in 6 months.         After Visit Summary (Printed 06/11/2019) Communications    [facility] Provider CC Chart Rep sent to [name], MD  Sent 06/12/2019   Chart Routed to Tanda Rockers, MD   Media From this encounter Electronic signature on 06/11/2019 4:08 PM - E-signed  Communication Routing History  Recipient Method Sent by Date Sent  [name], MD Fax Melvenia Needles, NP 06/12/2019  Fax: [phone]  Phone: [phone]       Was appointment offered to patient (explain)?     Reason for call: Cough for 1 week , Sore throat ,0 fever. 0 OTC medication tried.  Allergies  Allergen Reactions  . Ibuprofen Other (See Comments)    Confusion, Patient states that the 800mg  Motrin made her feel like she was flying.    Immunization History  Administered Date(s) Administered  . Influenza Split 09/18/2013, 10/19/2014, 09/18/2016   . Influenza Whole 10/19/2011, 09/19/2015  . Influenza, High Dose Seasonal PF 10/28/2018  . Influenza,inj,quad, With Preservative 10/18/2017  . Influenza-Unspecified 10/19/2014  . Moderna SARS-COVID-2 Vaccination 06/15/2019, 07/13/2019  . Pneumococcal Conjugate-13 10/28/2018

## 2019-10-10 DIAGNOSIS — I10 Essential (primary) hypertension: Secondary | ICD-10-CM | POA: Diagnosis not present

## 2019-10-10 DIAGNOSIS — C801 Malignant (primary) neoplasm, unspecified: Secondary | ICD-10-CM | POA: Diagnosis not present

## 2019-10-10 DIAGNOSIS — J309 Allergic rhinitis, unspecified: Secondary | ICD-10-CM | POA: Diagnosis not present

## 2019-10-10 DIAGNOSIS — M06049 Rheumatoid arthritis without rheumatoid factor, unspecified hand: Secondary | ICD-10-CM | POA: Diagnosis not present

## 2019-10-10 DIAGNOSIS — C779 Secondary and unspecified malignant neoplasm of lymph node, unspecified: Secondary | ICD-10-CM | POA: Diagnosis not present

## 2019-10-10 DIAGNOSIS — F0390 Unspecified dementia without behavioral disturbance: Secondary | ICD-10-CM | POA: Diagnosis not present

## 2019-10-10 DIAGNOSIS — I4891 Unspecified atrial fibrillation: Secondary | ICD-10-CM | POA: Diagnosis not present

## 2019-10-10 DIAGNOSIS — E785 Hyperlipidemia, unspecified: Secondary | ICD-10-CM | POA: Diagnosis not present

## 2019-10-10 DIAGNOSIS — E1140 Type 2 diabetes mellitus with diabetic neuropathy, unspecified: Secondary | ICD-10-CM | POA: Diagnosis not present

## 2019-10-11 DIAGNOSIS — Z03818 Encounter for observation for suspected exposure to other biological agents ruled out: Secondary | ICD-10-CM | POA: Diagnosis not present

## 2019-10-11 DIAGNOSIS — F0390 Unspecified dementia without behavioral disturbance: Secondary | ICD-10-CM | POA: Diagnosis not present

## 2019-10-11 DIAGNOSIS — R05 Cough: Secondary | ICD-10-CM | POA: Diagnosis not present

## 2019-10-11 DIAGNOSIS — E119 Type 2 diabetes mellitus without complications: Secondary | ICD-10-CM | POA: Diagnosis not present

## 2019-10-15 ENCOUNTER — Telehealth: Payer: Self-pay | Admitting: Internal Medicine

## 2019-10-15 NOTE — Telephone Encounter (Signed)
Delsym is the strongest non narcotic cough med and otc  If not adequate then can add to the delsym :  tessaalon 100 x 2 every 6 hours and when time to refill get the 200  No more phone care s ov with all meds in hand to see me or NP

## 2019-10-15 NOTE — Telephone Encounter (Signed)
Attempted to call pt but unable to reach. Left message for her to return call. 

## 2019-10-15 NOTE — Telephone Encounter (Signed)
Primary Pulmonologist: Wert Last office visit and with whom: 08/07/19 with Warner Mccreedy What do we see them for (pulmonary problems): cough variant asthma Last OV assessment/plan:  Assessment & Plan:   Allergic rhinitis Plan: Continue Singulair Start nasal saline rinses Start a daily antihistamine  Cough variant asthma vs UACS Acute on chronic issue No audible wheeze on exam today Rhinorrhea and nasal congestion seen  Plan: Continue Breo Ellipta 100 Lab work today Chest x-ray due to patient's request Continue Protonix Continue Singulair Start daily antihistamine Start nasal saline rinses We will repeat pulmonary function testing given the fact that patient is having worsening cough per her reporting and her being on methotrexate  Rheumatoid arthritis-On Methotrexate  Plan: Continue follow-up with rheumatology We will obtain pulmonary function testing  Metastatic squamous cell carcinoma involving lymph node with unknown primary site [facility]) Plan: Continue follow-up with oncology as well as radiation oncology  Abnormal findings on diagnostic imaging of lung Plan: Chest x-ray today Obtain CT imaging as currently planned in August/2021    Return in about 3 months (around 11/07/2019), or if symptoms worsen or fail to improve, for Follow up with Dr. Melvyn Novas, Follow up for FULL PFT - 60 min.  Was appointment offered to patient (explain)?  Pt wants recommendations   Reason for call: Pt called office 10/08/19 with complaints of cough and SOB with exertion. TP said for pt to use mucinex DM twice daily as needed fr cough/congestion and also stated for pt to get covid tested.   Called and spoke with pt who stated she went to get covid tested Thursday, 9/23 and was told Saturday, 9/25 that results were negative. Pt went to [facility] to have the covid test performed.   Pt states she is still having problems with coughing and states she will occ get up clear phlegm. Pt  also has an occ wheeze. Pt states cough is worse at night and states that she is unable to sleep due to coughing.  Pt has not tried any OTC cough meds. Asked pt if she had tried mucinex DM as recommended by TP and she stated that she had not. Pt stated she would get some to try. Pt also wanted to know if there was anything else MW could recommend.  Dr. Melvyn Novas, please advise.  (examples of things to ask: : When did symptoms start? Fever? Cough? Productive? Color to sputum? More sputum than usual? Wheezing? Have you needed increased oxygen? Are you taking your respiratory medications? What over the counter measures have you tried?)  Allergies  Allergen Reactions  . Ibuprofen Other (See Comments)    Confusion, Patient states that the 800mg  Motrin made her feel like she was flying.    Immunization History  Administered Date(s) Administered  . Influenza Split 09/18/2013, 10/19/2014, 09/18/2016  . Influenza Whole 10/19/2011, 09/19/2015  . Influenza, High Dose Seasonal PF 10/28/2018  . Influenza,inj,quad, With Preservative 10/18/2017  . Influenza-Unspecified 10/19/2014  . Moderna SARS-COVID-2 Vaccination 06/15/2019, 07/13/2019  . Pneumococcal Conjugate-13 10/28/2018

## 2019-10-17 ENCOUNTER — Telehealth: Payer: Self-pay | Admitting: Internal Medicine

## 2019-10-17 MED ORDER — BENZONATATE 200 MG PO CAPS: 200.0000 mg | ORAL_CAPSULE | Freq: Four times a day (QID) | ORAL | 1 refills | Status: DC

## 2019-10-17 NOTE — Telephone Encounter (Signed)
Called and spoke with patient to let her know of Dr. Morrison Old recs. Advised her to try OTC Delsym she states that she has tried that and it did not help. Offered to send in RX for Palmetto and she agreed. RX for Tessalon 200 mg sent into preferred pharmacy. Nothing further needed at this time.

## 2019-10-17 NOTE — Telephone Encounter (Signed)
Attempted to call pt but unable to reach. Left message for her to return call. Due to multiple attempts trying to reach pt and unable to do so, per triage protocol encounter will be closed.

## 2019-10-19 NOTE — Telephone Encounter (Signed)
Tried Engineering geologist at [facility], To give a verbal order for extended PT. LMOVM

## 2019-10-22 DIAGNOSIS — I739 Peripheral vascular disease, unspecified: Secondary | ICD-10-CM | POA: Diagnosis not present

## 2019-10-22 DIAGNOSIS — M79675 Pain in left toe(s): Secondary | ICD-10-CM | POA: Diagnosis not present

## 2019-10-22 DIAGNOSIS — I480 Paroxysmal atrial fibrillation: Secondary | ICD-10-CM | POA: Diagnosis not present

## 2019-10-24 ENCOUNTER — Other Ambulatory Visit: Payer: Self-pay

## 2019-10-24 ENCOUNTER — Telehealth: Payer: Self-pay | Admitting: Neurology

## 2019-10-24 MED ORDER — GABAPENTIN 100 MG PO CAPS: ORAL_CAPSULE | ORAL | 1 refills | Status: DC

## 2019-10-24 NOTE — Telephone Encounter (Signed)
[facility]ill sent to [facility] 

## 2019-10-26 ENCOUNTER — Other Ambulatory Visit: Payer: Self-pay | Admitting: Neurology

## 2019-10-26 NOTE — Telephone Encounter (Signed)
Refill sent in on 10/24/19

## 2019-10-29 DIAGNOSIS — M79671 Pain in right foot: Secondary | ICD-10-CM | POA: Diagnosis not present

## 2019-10-29 DIAGNOSIS — E119 Type 2 diabetes mellitus without complications: Secondary | ICD-10-CM | POA: Diagnosis not present

## 2019-10-29 DIAGNOSIS — E1140 Type 2 diabetes mellitus with diabetic neuropathy, unspecified: Secondary | ICD-10-CM | POA: Diagnosis not present

## 2019-10-29 DIAGNOSIS — B351 Tinea unguium: Secondary | ICD-10-CM | POA: Diagnosis not present

## 2019-10-29 DIAGNOSIS — M79672 Pain in left foot: Secondary | ICD-10-CM | POA: Diagnosis not present

## 2019-10-29 DIAGNOSIS — I739 Peripheral vascular disease, unspecified: Secondary | ICD-10-CM | POA: Diagnosis not present

## 2019-10-31 DIAGNOSIS — Z79899 Other long term (current) drug therapy: Secondary | ICD-10-CM | POA: Diagnosis not present

## 2019-10-31 DIAGNOSIS — M79672 Pain in left foot: Secondary | ICD-10-CM | POA: Diagnosis not present

## 2019-10-31 DIAGNOSIS — I10 Essential (primary) hypertension: Secondary | ICD-10-CM | POA: Diagnosis not present

## 2019-10-31 DIAGNOSIS — M19072 Primary osteoarthritis, left ankle and foot: Secondary | ICD-10-CM | POA: Diagnosis not present

## 2019-10-31 DIAGNOSIS — I739 Peripheral vascular disease, unspecified: Secondary | ICD-10-CM | POA: Diagnosis not present

## 2019-10-31 DIAGNOSIS — E785 Hyperlipidemia, unspecified: Secondary | ICD-10-CM | POA: Diagnosis not present

## 2019-10-31 DIAGNOSIS — M1990 Unspecified osteoarthritis, unspecified site: Secondary | ICD-10-CM | POA: Diagnosis not present

## 2019-10-31 DIAGNOSIS — Z886 Allergy status to analgesic agent status: Secondary | ICD-10-CM | POA: Diagnosis not present

## 2019-10-31 DIAGNOSIS — Z7951 Long term (current) use of inhaled steroids: Secondary | ICD-10-CM | POA: Diagnosis not present

## 2019-10-31 DIAGNOSIS — M25572 Pain in left ankle and joints of left foot: Secondary | ICD-10-CM | POA: Diagnosis not present

## 2019-10-31 DIAGNOSIS — I4891 Unspecified atrial fibrillation: Secondary | ICD-10-CM | POA: Diagnosis not present

## 2019-10-31 DIAGNOSIS — Z7902 Long term (current) use of antithrombotics/antiplatelets: Secondary | ICD-10-CM | POA: Diagnosis not present

## 2019-10-31 DIAGNOSIS — M79605 Pain in left leg: Secondary | ICD-10-CM | POA: Diagnosis not present

## 2019-11-06 DIAGNOSIS — K219 Gastro-esophageal reflux disease without esophagitis: Secondary | ICD-10-CM | POA: Diagnosis not present

## 2019-11-06 DIAGNOSIS — F419 Anxiety disorder, unspecified: Secondary | ICD-10-CM | POA: Diagnosis not present

## 2019-11-06 DIAGNOSIS — C801 Malignant (primary) neoplasm, unspecified: Secondary | ICD-10-CM | POA: Diagnosis not present

## 2019-11-06 DIAGNOSIS — E1169 Type 2 diabetes mellitus with other specified complication: Secondary | ICD-10-CM | POA: Diagnosis not present

## 2019-11-06 DIAGNOSIS — E559 Vitamin D deficiency, unspecified: Secondary | ICD-10-CM | POA: Diagnosis not present

## 2019-11-06 DIAGNOSIS — I700 Atherosclerosis of aorta: Secondary | ICD-10-CM | POA: Diagnosis not present

## 2019-11-06 DIAGNOSIS — I2510 Atherosclerotic heart disease of native coronary artery without angina pectoris: Secondary | ICD-10-CM | POA: Diagnosis not present

## 2019-11-06 DIAGNOSIS — F0390 Unspecified dementia without behavioral disturbance: Secondary | ICD-10-CM | POA: Diagnosis not present

## 2019-11-06 DIAGNOSIS — I4891 Unspecified atrial fibrillation: Secondary | ICD-10-CM | POA: Diagnosis not present

## 2019-11-06 DIAGNOSIS — E7800 Pure hypercholesterolemia, unspecified: Secondary | ICD-10-CM | POA: Diagnosis not present

## 2019-11-06 DIAGNOSIS — Z23 Encounter for immunization: Secondary | ICD-10-CM | POA: Diagnosis not present

## 2019-11-06 DIAGNOSIS — J45991 Cough variant asthma: Secondary | ICD-10-CM | POA: Diagnosis not present

## 2019-11-06 DIAGNOSIS — C779 Secondary and unspecified malignant neoplasm of lymph node, unspecified: Secondary | ICD-10-CM | POA: Diagnosis not present

## 2019-11-06 DIAGNOSIS — I739 Peripheral vascular disease, unspecified: Secondary | ICD-10-CM | POA: Diagnosis not present

## 2019-11-07 DIAGNOSIS — R0989 Other specified symptoms and signs involving the circulatory and respiratory systems: Secondary | ICD-10-CM | POA: Diagnosis not present

## 2019-11-12 ENCOUNTER — Encounter: Payer: Self-pay | Admitting: Internal Medicine

## 2019-11-12 ENCOUNTER — Ambulatory Visit: Payer: Medicare HMO | Admitting: Internal Medicine

## 2019-11-12 ENCOUNTER — Other Ambulatory Visit: Payer: Self-pay

## 2019-11-12 ENCOUNTER — Ambulatory Visit (INDEPENDENT_AMBULATORY_CARE_PROVIDER_SITE_OTHER): Payer: Medicare HMO | Admitting: Internal Medicine

## 2019-11-12 DIAGNOSIS — R058 Other specified cough: Secondary | ICD-10-CM | POA: Diagnosis not present

## 2019-11-12 DIAGNOSIS — J45991 Cough variant asthma: Secondary | ICD-10-CM

## 2019-11-12 DIAGNOSIS — M05749 Rheumatoid arthritis with rheumatoid factor of unspecified hand without organ or systems involvement: Secondary | ICD-10-CM | POA: Diagnosis not present

## 2019-11-12 LAB — PULMONARY FUNCTION TEST
DL/VA % pred: 116 %
DL/VA: 4.83 ml/min/mmHg/L
DLCO cor % pred: 67 %
DLCO cor: 11.31 ml/min/mmHg
DLCO unc % pred: 67 %
DLCO unc: 11.31 ml/min/mmHg
FEF 25-75 Post: 1.25 L/sec
FEF 25-75 Pre: 1.61 L/sec
FEF2575-%Change-Post: -22 %
FEF2575-%Pred-Post: 128 %
FEF2575-%Pred-Pre: 164 %
FEV1-%Change-Post: 2 %
FEV1-%Pred-Post: 91 %
FEV1-%Pred-Pre: 89 %
FEV1-Post: 1.36 L
FEV1-Pre: 1.32 L
FEV1FVC-%Change-Post: 0 %
FEV1FVC-%Pred-Pre: 114 %
FEV6-%Change-Post: 2 %
FEV6-%Pred-Post: 85 %
FEV6-%Pred-Pre: 83 %
FEV6-Post: 1.62 L
FEV6-Pre: 1.58 L
FEV6FVC-%Pred-Post: 107 %
FEV6FVC-%Pred-Pre: 107 %
FVC-%Change-Post: 1 %
FVC-%Pred-Post: 79 %
FVC-%Pred-Pre: 78 %
FVC-Post: 1.62 L
FVC-Pre: 1.59 L
Post FEV1/FVC ratio: 84 %
Post FEV6/FVC ratio: 100 %
Pre FEV1/FVC ratio: 83 %
Pre FEV6/FVC Ratio: 100 %
RV % pred: 109 %
RV: 2.56 L
TLC % pred: 87 %
TLC: 4.05 L

## 2019-11-12 MED ORDER — GABAPENTIN 300 MG PO CAPS: 300.0000 mg | ORAL_CAPSULE | Freq: Three times a day (TID) | ORAL | 2 refills | Status: DC

## 2019-11-12 NOTE — Progress Notes (Signed)
Full PFT performed today. °

## 2019-11-12 NOTE — Assessment & Plan Note (Signed)
-   Note POS GERD on UGI  02/28/13  - 01/06/2017  added h2 and 1st gen H1 blockers per guidelines  At HS > f/u prn  - 04/21/2018  No benefit from ICS/LAMA. Symptoms facor UACS. Take protonix 40mg  TWICE daily. Take Chlorpheniramine 4mg  every 4 hours as needed for cough. Refill tessalon perles for cough, continue delsym cough syrup and add mucinex twice a day  - titrate up gabapentin to max 300 mg tid/ bed blocks/ avoid mints in favor of jolly ranchers > refer to Bettina Gavia prn   Of the three most common causes of  Sub-acute / recurrent or chronic cough, only one (GERD)  can actually contribute to/ trigger  the other two (asthma and post nasal drip syndrome)  and perpetuate the cylce of cough.  While not intuitively obvious, many patients with chronic low grade reflux do not cough until there is a primary insult that disturbs the protective epithelial barrier and exposes sensitive nerve endings.   This is typically viral but can due to PNDS and  either may apply here.   The point is that once this occurs, it is difficult to eliminate the cycle  using anything but a maximally effective acid suppression regimen at least in the short run, accompanied by an appropriate diet to address non acid GERD and control / eliminate the cough itself with gabapentin as above.

## 2019-11-12 NOTE — Assessment & Plan Note (Addendum)
Onset decades prior to initial pulmonary eval 2014  - 10/18/2013  resume trial of dulera 100 2bid  -Med calendar 11/01/2013 > did not bring as instructed 12/04/13 - PFTs wnl 12/04/13 > ok to try off dulera - flare off dulera 08/14/14 > ok to resume dulera 100 2bid  - Spirometry 04/24/2015  wnl off dulera / even fef 25-75 - NO 04/24/2015  = 17  - 11/15/2016  try symbicort 80 2bid  - PFT's  01/06/2017  FEV1 1.59 (101 % ) ratio 86  p 4 % improvement from saba p symb 80 x 2 x 12h  prior to study with DLCO  67/68c % corrects to 100 % for alv volume   - 01/27/2018 cough resolved maint on just 1st gen H1 blockers per guidelines  And off inhalers  - 06/22/2018 flared off dulera > resume   - 07/12/2018   try BREO 100 one am for insurance purposes  - 12/06/2018  After extensive coaching inhaler device,  effectiveness =    90% with elipta > give breo 100 x 2 week sample then return with formulary to see NP/pharmacist for best choice. - PFT's  11/12/2019  FEV1 1.36 (91 % ) ratio 0.84  p 2 % improvement from saba p 0 prior to study with DLCO  11.31 (67%) corrects to 4.83 (116%)  for alv volume and FV curve nl    Instead of asthma here I strongly favor Upper airway cough syndrome (previously labeled PNDS),  is so named because it's frequently impossible to sort out how much is  CR/sinusitis with freq throat clearing (which can be related to primary GERD)   vs  causing  secondary (" extra esophageal")  GERD from wide swings in gastric pressure that occur with throat clearing, often  promoting self use of mint and menthol lozenges that reduce the lower esophageal sphincter tone and exacerbate the problem further in a cyclical fashion.   These are the same pts (now being labeled as having "irritable larynx syndrome" by some cough centers) who not infrequently have a history of having failed to tolerate ace inhibitors,  dry powder inhaler (like BREO) or biphosphonates or report having atypical/extraesophageal reflux symptoms  that don't respond to standard doses of PPI  and are easily confused as having aecopd or asthma flares by even experienced allergists/ pulmonologists (myself included).    Rec: Stop breo  Max rx for gerd including bed blocks/ no more tic tacs  Titrate up gabapentin to max of 300 mg tid F/u allergy in [place] and refer to Dr Bettina Gavia at [facility] / voice center if cough continues    Pulmonary f/u is prn

## 2019-11-12 NOTE — Progress Notes (Signed)
Subjective:   Patient ID: Lorraine Gilbert, female    DOB: [birth year]  MRN: [id number]        Brief patient profile:  33 yf from [place]  never smoker with cough and sob x decades previously eval by Dr Bernita Buffy referred by Dr Moreen Fowler 07/24/2012 for further evaluation of sob and cough with perfectly nl pfts 12/04/2013     History of Present Illness  07/24/2012 1st pulmonary eval cc cough x 2 months indolent onset progressively worse that is not better on advair, min prod of white mucus more day than night, assoc with mild sob. rec Stop advair Start dulera 100 Take 2 puffs first thing in am and then another 2 puffs about 12 hours later.  Pantoprazole (protonix) 40 mg  Take 30-60 min before first meal of the day and Pepcid 20 mg one bedtime until return to office - this is the best way to tell whether stomach acid is contributing to your problem.   GERD   If not better return here in 2 weeks all bottles/inhalers from all doctors or return to [place] and if you want to change to [facility] we will need you to bring your records with you as well >> did not return as rec      01/27/2018  f/u ov/Elior Robinette re: uacs improved on 1st gen H1 blockers per guidelines / finally brought meds as req   Chief Complaint  Patient presents with  . Follow-up    Her breathing is unchanged. She states still having hoarseness. No new co's.    Dyspnea:  MMRC2 = can't walk a nl pace on a flat grade s sob but does fine slow and flat eg shopping fine  Cough: in am only a cough or two  then ok  The rest of the day  Sleeping: flat ok  SABA use: none  rec Continue For drainage / throat tickle try take CHLORPHENIRAMINE  4 mg - take one every 4 hours as needed     06/22/2018 acute extended ov/Verlena Marlette re:  Flare of asthma off ics/laba Chief Complaint  Patient presents with  . Acute Visit    Increased SOB x 2 months- out of symbicort for about that time.   covid pcr neg 06/05/18  Doe x 100 ft =  MMRC3 = can't walk  100 yards even at a slow pace at a flat grade s stopping due to sob No cough  Sleeping on side/ 2 pillows/ bed iflat  No 02    Very confused with meds, poor correlation with those she brought vs what her med st indicates she takes  rec Resume dulera 100 Take 2 puffs first thing in am and then another 2 puffs about 12 hours later.  Work on inhaler technique:  relax and gently blow all the way out then take a nice smooth deep breath back in, triggering the inhaler at same time you start breathing in.  Hold for up to 5 seconds if you can. Blow out thru nose. Rinse and gargle with water when done If not better or can't afford dulera 100  >>> return in 2 weeks    07/12/2018  f/u ov/Danijah Noh re: asthma/ not aderent due to cost of care / brought meds but not med calendar Chief Complaint  Patient presents with  . Follow-up    Breathing has improved some. She coughs some at night, occ will wake her up.   Dyspnea:  Does fine as long as access to  dulera / much worse when runs out, has 36 doses left on one we gave her last ov Cough: no excess mucus Sleeping: better / sleeps flat with 2 pillows - only wakes up if doesn't take pm dulera SABA use: min  02: none rec Breo 100 take one puff each am  Please schedule a follow up office visit in 4 weeks   08/11/2018  f/u ov/Addy Mcmannis re: asthma/ non adeherent  ? Number on sample- the one med she  did not bring as req  Chief Complaint  Patient presents with  . Follow-up    Breathing is doing well and no new co's.   Dyspnea:  MMRC1 = can walk nl pace, flat grade, can't hurry or go uphills or steps s sob  Cough: some noct but mostly sporadic, daytime, dry   Sleeping: fine once asleep bed is flat SABA use: none 02: none  rec Continue BREO one click each am  If not able to afford BREO, you will need to return to see one of our NP 's with your drug formulary in hand to pick a cheaper alternative > did not return "lives off samples"     12/06/2018  f/u ov/Journey Ratterman re:   Cough variant asthma/ maint on amiodarone  Chief Complaint  Patient presents with  . Acute Visit    Pt c/o cough and wheezing x 3 wks "ithcy cough"- non prod. She ran out of Breo 2 wks ago due to cost.    Dyspnea:  Worse off breo = MMRC3 = can't walk 100 yards even at a slow pace at a flat grade s stopping due to sob  Cough: worse off breo day > noct, dry Sleeping: bed is flat / sleeps on 2 pillows  SABA use: none  02: none  rec Continue BREO one click each am x 2 week sample  In two weeks  you will need to return to see one of our NP 's with your drug formulary in hand to pick a cheaper alternative - pick a day when our pharmacist will be here to help you  Please schedule a follow up office visit in 6 months , call sooner if needed with all medications /inhalers/ solutions in hand so we can verify exactly what you are taking. This includes all medications from all doctors and over the [place] separate them into two bags:  the ones you take automatically, no matter what, vs the ones you take just when you feel you need them "BAG #2 is UP TO YOU"  - this will really help [place] help you take your medications more effectively.    11/12/2019  f/u ov/Shonta Bourque re: cough variant asthma better  Chief Complaint  Patient presents with  . Follow-up    Dry cough  Dyspnea:  Cough takes breath away  Cough: dry  Keeps her up  Sleeping: bed is flat/ 3pillows SABA use: once  A day  02: none    No obvious day to day or daytime variability or assoc excess/ purulent sputum or mucus plugs or hemoptysis or cp or chest tightness, subjective wheeze or overt sinus or hb symptoms.    Also denies any obvious fluctuation of symptoms with weather or environmental changes or other aggravating or alleviating factors except as outlined above   No unusual exposure hx or h/o childhood pna/ asthma or knowledge of premature birth.  Current Allergies, Complete Past Medical History, Past Surgical History, Family  History, and Social History were reviewed  in [employer] record.  ROS  The following are not active complaints unless bolded Hoarseness, sore throat, dysphagia, dental problems, itching, sneezing,  nasal congestion or discharge of excess mucus or purulent secretions, ear ache,   fever, chills, sweats, unintended wt loss or wt gain, classically pleuritic or exertional cp,  orthopnea pnd or arm/hand swelling  or leg swelling, presyncope, palpitations, abdominal pain, anorexia, nausea, vomiting, diarrhea  or change in bowel habits or change in bladder habits, change in stools or change in urine, dysuria, hematuria,  rash, arthralgias, visual complaints, headache, numbness, weakness or ataxia or problems with walking or coordination,  change in mood or  memory.        Current Meds  Medication Sig  . ACCU-CHEK AVIVA PLUS test strip 1 each as directed.  . Accu-Chek Softclix Lancets lancets 1 each by Other route as directed.  Marland Kitchen albuterol (VENTOLIN HFA) 108 (90 Base) MCG/ACT inhaler   . amiodarone (PACERONE) 200 MG tablet Take 1 tablet by mouth daily.  Marland Kitchen apixaban (ELIQUIS) 2.5 MG TABS tablet Take 2.5 mg by mouth 2 (two) times daily.   . Blood Glucose Calibration (ACCU-CHEK AVIVA) SOLN   . clopidogrel (PLAVIX) 75 MG tablet Take 75 mg by mouth daily.  Marland Kitchen escitalopram (LEXAPRO) 10 MG tablet Take 10 mg by mouth daily.  . fluticasone furoate-vilanterol (BREO ELLIPTA) 100-25 MCG/INH AEPB Inhale 1 puff into the lungs daily. (Patient taking differently: Inhale 1 puff into the lungs as needed. )  . fluticasone furoate-vilanterol (BREO ELLIPTA) 100-25 MCG/INH AEPB Inhale 1 puff into the lungs daily.  . fluticasone furoate-vilanterol (BREO ELLIPTA) 100-25 MCG/INH AEPB Inhale 1 puff into the lungs daily.  . folic acid (FOLVITE) 1 MG tablet Take 1 mg by mouth every morning.   . gabapentin (NEURONTIN) 100 MG capsule Take 1 capsule every night for 1 week, then increase to 1 cap twice a day  .  montelukast (SINGULAIR) 10 MG tablet   . Multiple Vitamin (MULTIVITAMIN WITH MINERALS) TABS tablet Take 1 tablet by mouth daily.  . nitroGLYCERIN (NITROSTAT) 0.4 MG SL tablet Place 0.4 mg under the tongue every 5 (five) minutes as needed for chest pain.   . pantoprazole (PROTONIX) 40 MG tablet Take 30- 60 min before your first and last meals of the day (Patient taking differently: daily. Take 30- 60 min before your first and last meals of the day)  . ticagrelor (BRILINTA) 90 MG TABS tablet Take 90 mg by mouth 2 (two) times daily.   . Vitamin D, Ergocalciferol, (DRISDOL) 1.25 MG (50000 UT) CAPS capsule Take 1 capsule by mouth every Wednesday.                    Objective:  Physical Exam  11/12/2019    141  12/06/2018   130  08/14/2014        138 >  04/24/2015   137> 08/08/2015  136 > 02/09/2016  132 >  11/15/2016    137 > 01/06/2017  133 > 01/05/2018   134 > 01/27/2018   133 >  07/12/2018  129    amb latina nad   Vital signs reviewed  11/12/2019  - Note at rest 02 sats  97% on RA       HEENT : pt wearing mask not removed for exam due to covid -19 concerns.    NECK :  without JVD/Nodes/TM/ nl carotid upstrokes bilaterally   LUNGS: no acc muscle use,  Nl contour chest which  is clear to A and P bilaterally without cough on insp or exp maneuvers   CV:  RRR  no s3 or murmur or increase in P2, and no edema   ABD:  soft and nontender with nl inspiratory excursion in the supine position. No bruits or organomegaly appreciated, bowel sounds nl  MS:  Nl gait/ ext warm without deformities, calf tenderness, cyanosis or clubbing No obvious joint restrictions   SKIN: warm and dry without lesions    NEURO:  alert, approp, nl sensorium with  no motor or cerebellar deficits apparent.     Assessment & Plan:

## 2019-11-12 NOTE — Patient Instructions (Addendum)
GERD (REFLUX)  is an extremely common cause of respiratory symptoms just like yours , many times with no obvious heartburn at all.    It can be treated with medication, but also with lifestyle changes including elevation of the head of your bed (ideally with 6 -8inch blocks under the headboard of your bed),  Smoking cessation, avoidance of late meals, excessive alcohol, and avoid fatty foods, chocolate, peppermint, colas, red wine, and acidic juices such as orange juice.  NO MINT OR MENTHOL PRODUCTS SO NO COUGH DROPS  USE SUGARLESS CANDY INSTEAD (Jolley ranchers or Stover's or Life Savers) or even ice chips will also do - the key is to swallow to prevent all throat clearing. NO OIL BASED VITAMINS - use powdered substitutes.  Avoid fish oil when coughing.  It is very unlikely that asthma is making you cough and BREO is not helping either so I advised you stop BREO  Your cough is coming from your throat not your throat.   Best medication is gabapentin 300 mg twice daily (start with 100 x 2 twice daily) and if not better push up to 300 mg three times daily and call for referral to Dr Carol Ada at ENT at [facility]/ voice center   As needed  For > tessalon 200 mg every 6 - 8 h hours  If get more short of breath when not coughing >  Albuterol every 4 hours as need until you see your allergist.    Pulmonary follow up is as needed

## 2019-11-12 NOTE — Assessment & Plan Note (Addendum)
On Methotrexate per Dr Trudie Reed as of ov 12/04/13 - PFT's wnl 12/04/13 with dlco 79% - MTX restarted around Nov 01 2016  - repeat pfts 01/06/2017  - PFT's  FVC 1.84 (97%)    with DLCO  67/68c % corrects to 100 % for alv volume   - PFT's  11/12/2019 FVC 1.62 with DLCO  11.31 (67%) corrects to 4.83 (116%)  for alv volume    No evidence of significant ILD  Related to RA or MTX or amiodarone  Patients typically have been on amiodarone for 6-12 months before this complication manifests.  Of note, serial clinical evaluation for symptoms such as cough dyspnea or fevers is  the preferred method of monitoring for pulmonary toxicity because a decrease in DLCO or lung volumes is a nonspecific for toxicity. Pathologically amiodarone pulmonary toxicity may appear as interstitial pneumonitis, eosinophilic pneumonia, organizing pneumonia, pulmonary fibrosis or less commonly as diffuse alveolar hemorrhage, pulmonary nodules or pleural effusions.  Risk factors for pulmonary toxicity include age greater than 70, daily dose greater than equal to 400 mg, a high cumulative dose, or pre-existing lung disease    Advised:  Make sure you check your oxygen saturations at highest level of activity to be sure it stays over 90% and keep track of it at least once a week, more often if breathing getting worse, and let me know if losing ground.   Pulmonary f/u is prn  Medical decision making was a moderate level of complexity in this case because of  two chronic conditions /diagnoses requiring extra time for  H and P, chart review, counseling,  and generating customized AVS unique to this office visit and charting = 9min total time    Each maintenance medication was reviewed in detail including emphasizing most importantly the difference between maintenance and prns and under what circumstances the prns are to be triggered using an action plan format where appropriate. Please see avs for details which were reviewed in writing by  both me and my nurse and patient given a written copy highlighted where appropriate with yellow highlighter for the patient's continued care at home along with an updated version of their medications.  Patient was asked to maintain medication reconciliation by comparing this list to the actual medications being used at home and to contact this office right away if there is a conflict or discrepancy.

## 2019-11-13 NOTE — Progress Notes (Signed)
Pulmonary function test results discussed with patient at last appointment with Dr. Melvyn Novas.  Further needed.Lorraine Quaker, FNP

## 2019-11-14 ENCOUNTER — Ambulatory Visit
Admission: RE | Admit: 2019-11-14 | Discharge: 2019-11-14 | Disposition: A | Discharge status: Home / Self Care | Payer: Medicare HMO | Source: Ambulatory Visit | Attending: Adult Health | Admitting: Adult Health

## 2019-11-14 ENCOUNTER — Other Ambulatory Visit: Payer: Self-pay | Admitting: Family Medicine

## 2019-11-14 DIAGNOSIS — C779 Secondary and unspecified malignant neoplasm of lymph node, unspecified: Secondary | ICD-10-CM

## 2019-11-14 DIAGNOSIS — R911 Solitary pulmonary nodule: Secondary | ICD-10-CM

## 2019-11-14 DIAGNOSIS — C801 Malignant (primary) neoplasm, unspecified: Secondary | ICD-10-CM

## 2019-11-14 DIAGNOSIS — Z1231 Encounter for screening mammogram for malignant neoplasm of breast: Secondary | ICD-10-CM

## 2019-11-14 DIAGNOSIS — C4492 Squamous cell carcinoma of skin, unspecified: Secondary | ICD-10-CM | POA: Diagnosis not present

## 2019-11-14 DIAGNOSIS — I2510 Atherosclerotic heart disease of native coronary artery without angina pectoris: Secondary | ICD-10-CM | POA: Diagnosis not present

## 2019-11-14 DIAGNOSIS — I700 Atherosclerosis of aorta: Secondary | ICD-10-CM | POA: Diagnosis not present

## 2019-11-14 DIAGNOSIS — M4856XA Collapsed vertebra, not elsewhere classified, lumbar region, initial encounter for fracture: Secondary | ICD-10-CM | POA: Diagnosis not present

## 2019-11-15 ENCOUNTER — Other Ambulatory Visit: Payer: Self-pay

## 2019-11-15 ENCOUNTER — Ambulatory Visit
Admission: RE | Admit: 2019-11-15 | Discharge: 2019-11-15 | Disposition: A | Discharge status: Home / Self Care | Payer: Medicare HMO | Source: Ambulatory Visit | Attending: Family Medicine | Admitting: Family Medicine

## 2019-11-15 DIAGNOSIS — E041 Nontoxic single thyroid nodule: Secondary | ICD-10-CM | POA: Diagnosis not present

## 2019-11-15 DIAGNOSIS — Z1231 Encounter for screening mammogram for malignant neoplasm of breast: Secondary | ICD-10-CM

## 2019-11-21 DIAGNOSIS — J300 Vasomotor rhinitis: Secondary | ICD-10-CM | POA: Diagnosis not present

## 2019-11-21 DIAGNOSIS — R053 Chronic cough: Secondary | ICD-10-CM | POA: Diagnosis not present

## 2019-11-21 DIAGNOSIS — J4540 Moderate persistent asthma, uncomplicated: Secondary | ICD-10-CM | POA: Diagnosis not present

## 2019-11-23 DIAGNOSIS — I739 Peripheral vascular disease, unspecified: Secondary | ICD-10-CM | POA: Diagnosis not present

## 2019-11-23 DIAGNOSIS — E1140 Type 2 diabetes mellitus with diabetic neuropathy, unspecified: Secondary | ICD-10-CM | POA: Diagnosis not present

## 2019-11-23 DIAGNOSIS — B351 Tinea unguium: Secondary | ICD-10-CM | POA: Diagnosis not present

## 2019-11-23 DIAGNOSIS — L03039 Cellulitis of unspecified toe: Secondary | ICD-10-CM | POA: Diagnosis not present

## 2019-11-23 DIAGNOSIS — M79672 Pain in left foot: Secondary | ICD-10-CM | POA: Diagnosis not present

## 2019-11-23 DIAGNOSIS — L600 Ingrowing nail: Secondary | ICD-10-CM | POA: Diagnosis not present

## 2019-11-26 ENCOUNTER — Telehealth: Payer: Self-pay | Admitting: Internal Medicine

## 2019-11-26 DIAGNOSIS — D170 Benign lipomatous neoplasm of skin and subcutaneous tissue of head, face and neck: Secondary | ICD-10-CM | POA: Diagnosis not present

## 2019-11-26 NOTE — Telephone Encounter (Signed)
Call patient : Study does not show any definite cancer there areas that may represent amiodarone or methotrexate effects or the effects of her rheumatoid arthritis on her lungs  Rec: copy to rhem , cards, oncology [facility] in 6 months (I placed in reminder file) with ov with me the next day and in meantime identify who is following her where for these 3 specialties as we need to be sure we are all on the same page.  Patient is aware of results and voiced her understanding.  Nothing further needed.

## 2019-11-26 NOTE — Progress Notes (Signed)
LMTCB

## 2019-12-06 ENCOUNTER — Other Ambulatory Visit: Payer: Self-pay

## 2019-12-06 ENCOUNTER — Ambulatory Visit
Admission: RE | Admit: 2019-12-06 | Discharge: 2019-12-06 | Disposition: A | Discharge status: Home / Self Care | Payer: Medicare HMO | Source: Ambulatory Visit | Attending: Radiation Oncology | Admitting: Radiation Oncology

## 2019-12-06 ENCOUNTER — Encounter: Payer: Self-pay | Admitting: Radiation Oncology

## 2019-12-06 DIAGNOSIS — Z923 Personal history of irradiation: Secondary | ICD-10-CM | POA: Diagnosis not present

## 2019-12-06 DIAGNOSIS — Z79899 Other long term (current) drug therapy: Secondary | ICD-10-CM | POA: Insufficient documentation

## 2019-12-06 DIAGNOSIS — Z8589 Personal history of malignant neoplasm of other organs and systems: Secondary | ICD-10-CM | POA: Diagnosis not present

## 2019-12-06 DIAGNOSIS — Z859 Personal history of malignant neoplasm, unspecified: Secondary | ICD-10-CM | POA: Diagnosis not present

## 2019-12-06 DIAGNOSIS — I700 Atherosclerosis of aorta: Secondary | ICD-10-CM | POA: Diagnosis not present

## 2019-12-06 DIAGNOSIS — Z8579 Personal history of other malignant neoplasms of lymphoid, hematopoietic and related tissues: Secondary | ICD-10-CM | POA: Diagnosis not present

## 2019-12-06 DIAGNOSIS — Z7901 Long term (current) use of anticoagulants: Secondary | ICD-10-CM | POA: Insufficient documentation

## 2019-12-06 DIAGNOSIS — Z08 Encounter for follow-up examination after completed treatment for malignant neoplasm: Secondary | ICD-10-CM | POA: Diagnosis not present

## 2019-12-06 DIAGNOSIS — C801 Malignant (primary) neoplasm, unspecified: Secondary | ICD-10-CM

## 2019-12-06 DIAGNOSIS — R918 Other nonspecific abnormal finding of lung field: Secondary | ICD-10-CM | POA: Diagnosis not present

## 2019-12-06 DIAGNOSIS — C779 Secondary and unspecified malignant neoplasm of lymph node, unspecified: Secondary | ICD-10-CM

## 2019-12-06 NOTE — Progress Notes (Signed)
Radiation Oncology         [phone] ________________________________  Name: Lorraine Gilbert MRN: [id number]  Date: 12/06/2019  DOB: 1932/06/11  Follow-Up Visit Note  CC: [name], MD  [name], MD    ICD-10-CM   1. Metastatic squamous cell carcinoma involving lymph node with unknown primary site [facility])  C77.9    C80.1     Diagnosis: Metastatic squamous cell carcinomapresenting in the left inguinal area (unknown primary site)  Interval Since Last Radiation: One year, fourth months, two weeks, and five days  07/05/2018 - 07/18/2018: Left Inguinal / 30 Gy in 10 fractions  Narrative:  The patient returns today for routine follow-up. Since her last visit, she underwent a chest CT scan on 11/14/2019 to follow-up on lung nodule. Results showed waxing and waning ground-glass opacities in the lungs. Many of the prior opacities had resolved but there were some new ground-glass opacities and new mild subpleural nodularity. These were likely inflammatory given the waxing and waning appearance. The possibility of a new small focus of low-grade adenocarcinoma was difficult to totally exclude. 6-12 mont surveillance was recommended. The patient has been in close follow up with pulmonology.  Patient reports she will have another scan in approximately 6 months  Of note, the patient underwent a bilateral screening mammogram on 11/15/2019 that did not show any mammographic evidence of malignancy.   On review of systems, she reports feeling well. She denies pelvic pain vaginal bleeding hematuria or rectal bleeding.  ALLERGIES:  is allergic to ibuprofen.  Meds: Current Outpatient Medications  Medication Sig Dispense Refill  . ACCU-CHEK AVIVA PLUS test strip 1 each as directed.    . Accu-Chek Softclix Lancets lancets 1 each by Other route as directed.    Marland Kitchen albuterol (VENTOLIN HFA) 108 (90 Base) MCG/ACT inhaler     . amiodarone (PACERONE) 200 MG tablet Take 1 tablet by mouth daily.    Marland Kitchen  apixaban (ELIQUIS) 2.5 MG TABS tablet Take 2.5 mg by mouth 2 (two) times daily.     . benzonatate (TESSALON) 100 MG capsule Take by mouth.    . benzonatate (TESSALON) 200 MG capsule Take 1 capsule (200 mg total) by mouth every 6 (six) hours. 30 capsule 1  . Blood Glucose Calibration (ACCU-CHEK AVIVA) SOLN     . clopidogrel (PLAVIX) 75 MG tablet Take 75 mg by mouth daily.    Marland Kitchen escitalopram (LEXAPRO) 10 MG tablet Take 10 mg by mouth daily.    . fluticasone furoate-vilanterol (BREO ELLIPTA) 100-25 MCG/INH AEPB Inhale 1 puff into the lungs daily. (Patient taking differently: Inhale 1 puff into the lungs as needed. ) 14 each 0  . fluticasone furoate-vilanterol (BREO ELLIPTA) 100-25 MCG/INH AEPB Inhale 1 puff into the lungs daily. 14 each 0  . fluticasone furoate-vilanterol (BREO ELLIPTA) 100-25 MCG/INH AEPB Inhale 1 puff into the lungs daily. 28 each 0  . folic acid (FOLVITE) 1 MG tablet Take 1 mg by mouth every morning.     . gabapentin (NEURONTIN) 300 MG capsule Take 1 capsule (300 mg total) by mouth 3 (three) times daily. 90 capsule 2  . montelukast (SINGULAIR) 10 MG tablet     . Multiple Vitamin (MULTIVITAMIN WITH MINERALS) TABS tablet Take 1 tablet by mouth daily.    . nitroGLYCERIN (NITROSTAT) 0.4 MG SL tablet Place 0.4 mg under the tongue every 5 (five) minutes as needed for chest pain.     . pantoprazole (PROTONIX) 40 MG tablet Take 30- 60 min before your first  and last meals of the day (Patient taking differently: daily. Take 30- 60 min before your first and last meals of the day) 60 tablet 2  . ticagrelor (BRILINTA) 90 MG TABS tablet Take 90 mg by mouth 2 (two) times daily.     . Vitamin D, Ergocalciferol, (DRISDOL) 1.25 MG (50000 UT) CAPS capsule Take 1 capsule by mouth every Wednesday.     No current facility-administered medications for this encounter.    Physical Findings: The patient is in no acute distress. Patient is alert and oriented.  weight is 140 lb 3.2 oz (63.6 kg). Her oral  temperature is 97 F (36.1 C) (abnormal). Her blood pressure is 169/46 (abnormal) and her pulse is 52 (abnormal). Her respiration is 20 and oxygen saturation is 100%.   Lungs are clear to auscultation bilaterally. Heart has regular rate and rhythm. No palpable cervical, supraclavicular, or axillary adenopathy. Abdomen soft, non-tender, normal bowel sounds.   Examination of the inguinal areas reveals no palpable adenopathy.  No appreciable radiation changes noted in the left inguinal area.  On pelvic exam the external genitalia are unremarkable.  A speculum exam is performed.  No mucosal lesions noted in the vaginal vault.  On bimanual examination there are no pelvic masses appreciated.  Lab Findings: Lab Results  Component Value Date   WBC 7.7 08/07/2019   HGB 13.4 08/07/2019   HCT 40.3 08/07/2019   MCV 89.9 08/07/2019   PLT 301.0 08/07/2019    Radiographic Findings: CT CHEST WO CONTRAST  Result Date: 11/14/2019 CLINICAL DATA:  Lung nodule follow up.  Squamous cell carcinoma EXAM: CT CHEST WITHOUT CONTRAST TECHNIQUE: Multidetector CT imaging of the chest was performed following the standard protocol without IV contrast. COMPARISON:  03/02/2019 FINDINGS: Cardiovascular: Coronary, aortic arch, and branch vessel atherosclerotic vascular disease. Mediastinum/Nodes: The right hilar node 0.9 cm in short axis on image 61 of series 2, previously 1.0 cm. Lungs/Pleura: Scattered scarring in the lungs with interstitial accentuation favoring the lung bases, possibly fibrosis. Increased confluent reticular and nodular opacity in the left upper lobe measuring 1.3 by 1.0 cm on image 34 of series 8, essentially new compared to the prior exam. Similarly new subpleural sub solid nodularity anteriorly in the left upper lobe measuring 0.7 by 0.6 cm on image 42 of series 8. New sub solid region medially in the left upper lobe measuring 2.4 by 1.1 cm on image 53 of series 8. Compared to the previous exam, a variety of  the previous sub solid nodules have resolved. For example, the prior medial left upper lobe 1.4 by 1.0 cm sub solid nodule shown on image 29 of series 5 of the 03/02/2019 exam has resolved. The subpleural confluent nodularity posteriorly in the right lower lobe on image 92 of series 5 of that prior exam has resolved, as have some other opacities. Upper Abdomen: Prominent lateral segment left hepatic lobe, cannot exclude early cirrhosis. Abdominal aortic atherosclerosis. Musculoskeletal: Remote superior endplate compression fracture at L1. IMPRESSION: 1. Waxing and waning ground-glass opacities in the lungs. Many of the prior opacities have resolved but there are some new ground-glass opacities and new mild subpleural nodularity. These are likely inflammatory given the waxing and waning appearance. Clearly, the possibility of a new small focus of low-grade adenocarcinoma is difficult to totally exclude in this situation. Consider surveillance noncontrast CT in 6-12 months time. 2. Cannot exclude early cirrhosis. 3.  Aortic Atherosclerosis (ICD10-I70.0). Electronically Signed   By: Van Clines M.D.   On: 11/14/2019 16:02  MM 3D SCREEN BREAST BILATERAL  Result Date: 11/16/2019 CLINICAL DATA:  Screening. EXAM: DIGITAL SCREENING BILATERAL MAMMOGRAM WITH TOMO AND CAD COMPARISON:  Previous exam(s). ACR Breast Density Category b: There are scattered areas of fibroglandular density. FINDINGS: There are no findings suspicious for malignancy. Images were processed with CAD. IMPRESSION: No mammographic evidence of malignancy. A result letter of this screening mammogram will be mailed directly to the patient. RECOMMENDATION: Screening mammogram in one year. (Code:SM-B-01Y) BI-RADS CATEGORY  1: Negative. Electronically Signed   By: Margarette Canada M.D.   On: 11/16/2019 14:19    Impression:  Metastatic squamous cell carcinomapresenting in the left inguinal area (unknown primary site).  No evidence of recurrence on  clinical exam today.  Plan: The patient will continue to follow up with pulmonology regarding lung nodules. She will follow up with radiation oncology in six months.  Total time spent in this encounter was 25 minutes which included reviewing the patient's most recent chest CT scan, screening mammogram, follow-ups, physical examination, and documentation.   ____________________________________  Blair Promise, PhD, MD  This document serves as a record of services personally performed by Gery Pray, MD. It was created on his behalf by Clerance Lav, a trained medical scribe. The creation of this record is based on the scribe's personal observations and the provider's statements to them. This document has been checked and approved by the attending provider.

## 2019-12-06 NOTE — Progress Notes (Signed)
Patient here for a 6 month f/u visit with Dr. Sondra Come.  07/05/2018 - 07/18/2018:Left Inguinal / 30 Gy in 10 fractions  Patient denies any problems at her point of radiation.   Patient had a CT of her chest with contrast on 11/14/2019. (DR. Melvyn Novas)  BP (!) 169/46 (BP Location: Left Arm, Patient Position: Sitting, Cuff Size: Normal)   Pulse (!) 52   Temp (!) 97 F (36.1 C) (Oral)   Resp 20   Wt 140 lb 3.2 oz (63.6 kg)   SpO2 100%   BMI 26.49 kg/m   Wt Readings from Last 3 Encounters:  12/06/19 140 lb 3.2 oz (63.6 kg)  11/12/19 141 lb (64 kg)  08/15/19 137 lb (62.1 kg)

## 2019-12-07 ENCOUNTER — Ambulatory Visit: Payer: Medicare HMO | Admitting: Neurology

## 2019-12-11 DIAGNOSIS — Z955 Presence of coronary angioplasty implant and graft: Secondary | ICD-10-CM | POA: Diagnosis not present

## 2019-12-11 DIAGNOSIS — I2510 Atherosclerotic heart disease of native coronary artery without angina pectoris: Secondary | ICD-10-CM | POA: Diagnosis not present

## 2019-12-11 DIAGNOSIS — I10 Essential (primary) hypertension: Secondary | ICD-10-CM | POA: Diagnosis not present

## 2019-12-11 DIAGNOSIS — M79604 Pain in right leg: Secondary | ICD-10-CM | POA: Diagnosis not present

## 2019-12-11 DIAGNOSIS — M79605 Pain in left leg: Secondary | ICD-10-CM | POA: Diagnosis not present

## 2019-12-14 DIAGNOSIS — I2510 Atherosclerotic heart disease of native coronary artery without angina pectoris: Secondary | ICD-10-CM | POA: Diagnosis not present

## 2019-12-14 DIAGNOSIS — I739 Peripheral vascular disease, unspecified: Secondary | ICD-10-CM | POA: Diagnosis not present

## 2019-12-14 DIAGNOSIS — Z7901 Long term (current) use of anticoagulants: Secondary | ICD-10-CM | POA: Diagnosis not present

## 2019-12-14 DIAGNOSIS — G629 Polyneuropathy, unspecified: Secondary | ICD-10-CM | POA: Diagnosis not present

## 2019-12-14 DIAGNOSIS — Z955 Presence of coronary angioplasty implant and graft: Secondary | ICD-10-CM | POA: Diagnosis not present

## 2019-12-14 DIAGNOSIS — I7090 Unspecified atherosclerosis: Secondary | ICD-10-CM | POA: Insufficient documentation

## 2019-12-14 DIAGNOSIS — I872 Venous insufficiency (chronic) (peripheral): Secondary | ICD-10-CM | POA: Insufficient documentation

## 2019-12-14 DIAGNOSIS — I4891 Unspecified atrial fibrillation: Secondary | ICD-10-CM | POA: Diagnosis not present

## 2019-12-14 DIAGNOSIS — Z79899 Other long term (current) drug therapy: Secondary | ICD-10-CM | POA: Diagnosis not present

## 2019-12-24 DIAGNOSIS — M79672 Pain in left foot: Secondary | ICD-10-CM | POA: Diagnosis not present

## 2019-12-24 DIAGNOSIS — M722 Plantar fascial fibromatosis: Secondary | ICD-10-CM | POA: Diagnosis not present

## 2019-12-24 DIAGNOSIS — L03039 Cellulitis of unspecified toe: Secondary | ICD-10-CM | POA: Diagnosis not present

## 2019-12-27 DIAGNOSIS — Z6824 Body mass index (BMI) 24.0-24.9, adult: Secondary | ICD-10-CM | POA: Diagnosis not present

## 2019-12-27 DIAGNOSIS — M48061 Spinal stenosis, lumbar region without neurogenic claudication: Secondary | ICD-10-CM | POA: Diagnosis not present

## 2019-12-27 DIAGNOSIS — M0579 Rheumatoid arthritis with rheumatoid factor of multiple sites without organ or systems involvement: Secondary | ICD-10-CM | POA: Diagnosis not present

## 2019-12-27 DIAGNOSIS — Z79899 Other long term (current) drug therapy: Secondary | ICD-10-CM | POA: Diagnosis not present

## 2019-12-27 DIAGNOSIS — M353 Polymyalgia rheumatica: Secondary | ICD-10-CM | POA: Diagnosis not present

## 2019-12-27 DIAGNOSIS — M159 Polyosteoarthritis, unspecified: Secondary | ICD-10-CM | POA: Diagnosis not present

## 2020-01-01 ENCOUNTER — Ambulatory Visit: Payer: Medicare HMO | Admitting: Neurology

## 2020-01-17 ENCOUNTER — Other Ambulatory Visit: Payer: Self-pay | Admitting: Physician Assistant

## 2020-01-17 DIAGNOSIS — K921 Melena: Secondary | ICD-10-CM | POA: Diagnosis not present

## 2020-01-17 DIAGNOSIS — R1013 Epigastric pain: Secondary | ICD-10-CM

## 2020-01-17 DIAGNOSIS — K760 Fatty (change of) liver, not elsewhere classified: Secondary | ICD-10-CM | POA: Diagnosis not present

## 2020-01-17 DIAGNOSIS — R0989 Other specified symptoms and signs involving the circulatory and respiratory systems: Secondary | ICD-10-CM | POA: Diagnosis not present

## 2020-01-17 DIAGNOSIS — R140 Abdominal distension (gaseous): Secondary | ICD-10-CM | POA: Diagnosis not present

## 2020-01-29 DIAGNOSIS — R109 Unspecified abdominal pain: Secondary | ICD-10-CM | POA: Diagnosis not present

## 2020-01-29 DIAGNOSIS — Z7901 Long term (current) use of anticoagulants: Secondary | ICD-10-CM | POA: Diagnosis not present

## 2020-01-29 DIAGNOSIS — Z79899 Other long term (current) drug therapy: Secondary | ICD-10-CM | POA: Diagnosis not present

## 2020-01-29 DIAGNOSIS — Z888 Allergy status to other drugs, medicaments and biological substances status: Secondary | ICD-10-CM | POA: Diagnosis not present

## 2020-01-29 DIAGNOSIS — R1084 Generalized abdominal pain: Secondary | ICD-10-CM | POA: Diagnosis not present

## 2020-01-29 DIAGNOSIS — K3189 Other diseases of stomach and duodenum: Secondary | ICD-10-CM | POA: Diagnosis not present

## 2020-01-29 DIAGNOSIS — Z791 Long term (current) use of non-steroidal anti-inflammatories (NSAID): Secondary | ICD-10-CM | POA: Diagnosis not present

## 2020-01-29 DIAGNOSIS — R1110 Vomiting, unspecified: Secondary | ICD-10-CM | POA: Diagnosis not present

## 2020-01-29 DIAGNOSIS — I4891 Unspecified atrial fibrillation: Secondary | ICD-10-CM | POA: Diagnosis not present

## 2020-01-29 DIAGNOSIS — N281 Cyst of kidney, acquired: Secondary | ICD-10-CM | POA: Diagnosis not present

## 2020-01-29 DIAGNOSIS — I10 Essential (primary) hypertension: Secondary | ICD-10-CM | POA: Diagnosis not present

## 2020-01-29 DIAGNOSIS — K6389 Other specified diseases of intestine: Secondary | ICD-10-CM | POA: Diagnosis not present

## 2020-01-29 DIAGNOSIS — K449 Diaphragmatic hernia without obstruction or gangrene: Secondary | ICD-10-CM | POA: Diagnosis not present

## 2020-01-29 DIAGNOSIS — E785 Hyperlipidemia, unspecified: Secondary | ICD-10-CM | POA: Diagnosis not present

## 2020-01-30 DIAGNOSIS — K3189 Other diseases of stomach and duodenum: Secondary | ICD-10-CM | POA: Diagnosis not present

## 2020-01-30 DIAGNOSIS — K449 Diaphragmatic hernia without obstruction or gangrene: Secondary | ICD-10-CM | POA: Diagnosis not present

## 2020-01-30 DIAGNOSIS — N281 Cyst of kidney, acquired: Secondary | ICD-10-CM | POA: Diagnosis not present

## 2020-01-30 DIAGNOSIS — K6389 Other specified diseases of intestine: Secondary | ICD-10-CM | POA: Diagnosis not present

## 2020-02-01 DIAGNOSIS — R350 Frequency of micturition: Secondary | ICD-10-CM | POA: Diagnosis not present

## 2020-02-01 DIAGNOSIS — R030 Elevated blood-pressure reading, without diagnosis of hypertension: Secondary | ICD-10-CM | POA: Diagnosis not present

## 2020-02-06 ENCOUNTER — Ambulatory Visit
Admission: RE | Admit: 2020-02-06 | Discharge: 2020-02-06 | Disposition: A | Discharge status: Home / Self Care | Payer: Medicare HMO | Source: Ambulatory Visit | Attending: Physician Assistant | Admitting: Physician Assistant

## 2020-02-06 DIAGNOSIS — N281 Cyst of kidney, acquired: Secondary | ICD-10-CM | POA: Diagnosis not present

## 2020-02-06 DIAGNOSIS — R1013 Epigastric pain: Secondary | ICD-10-CM

## 2020-02-06 DIAGNOSIS — K760 Fatty (change of) liver, not elsewhere classified: Secondary | ICD-10-CM | POA: Diagnosis not present

## 2020-02-07 DIAGNOSIS — K5904 Chronic idiopathic constipation: Secondary | ICD-10-CM | POA: Diagnosis not present

## 2020-02-07 DIAGNOSIS — R140 Abdominal distension (gaseous): Secondary | ICD-10-CM | POA: Diagnosis not present

## 2020-02-07 DIAGNOSIS — K219 Gastro-esophageal reflux disease without esophagitis: Secondary | ICD-10-CM | POA: Diagnosis not present

## 2020-02-22 DIAGNOSIS — I2510 Atherosclerotic heart disease of native coronary artery without angina pectoris: Secondary | ICD-10-CM | POA: Diagnosis not present

## 2020-02-26 DIAGNOSIS — K219 Gastro-esophageal reflux disease without esophagitis: Secondary | ICD-10-CM | POA: Diagnosis not present

## 2020-02-26 DIAGNOSIS — I739 Peripheral vascular disease, unspecified: Secondary | ICD-10-CM | POA: Diagnosis not present

## 2020-02-26 DIAGNOSIS — I2510 Atherosclerotic heart disease of native coronary artery without angina pectoris: Secondary | ICD-10-CM | POA: Diagnosis not present

## 2020-02-26 DIAGNOSIS — C779 Secondary and unspecified malignant neoplasm of lymph node, unspecified: Secondary | ICD-10-CM | POA: Diagnosis not present

## 2020-02-26 DIAGNOSIS — J45991 Cough variant asthma: Secondary | ICD-10-CM | POA: Diagnosis not present

## 2020-02-26 DIAGNOSIS — C801 Malignant (primary) neoplasm, unspecified: Secondary | ICD-10-CM | POA: Diagnosis not present

## 2020-02-26 DIAGNOSIS — F419 Anxiety disorder, unspecified: Secondary | ICD-10-CM | POA: Diagnosis not present

## 2020-02-26 DIAGNOSIS — E1169 Type 2 diabetes mellitus with other specified complication: Secondary | ICD-10-CM | POA: Diagnosis not present

## 2020-02-26 DIAGNOSIS — I4891 Unspecified atrial fibrillation: Secondary | ICD-10-CM | POA: Diagnosis not present

## 2020-03-06 DIAGNOSIS — I70212 Atherosclerosis of native arteries of extremities with intermittent claudication, left leg: Secondary | ICD-10-CM | POA: Diagnosis not present

## 2020-03-06 DIAGNOSIS — I10 Essential (primary) hypertension: Secondary | ICD-10-CM | POA: Diagnosis not present

## 2020-03-06 DIAGNOSIS — I7092 Chronic total occlusion of artery of the extremities: Secondary | ICD-10-CM | POA: Diagnosis not present

## 2020-03-06 DIAGNOSIS — R002 Palpitations: Secondary | ICD-10-CM | POA: Diagnosis not present

## 2020-03-06 DIAGNOSIS — I4891 Unspecified atrial fibrillation: Secondary | ICD-10-CM | POA: Diagnosis not present

## 2020-03-06 DIAGNOSIS — I272 Pulmonary hypertension, unspecified: Secondary | ICD-10-CM | POA: Diagnosis not present

## 2020-03-06 DIAGNOSIS — Z959 Presence of cardiac and vascular implant and graft, unspecified: Secondary | ICD-10-CM | POA: Diagnosis not present

## 2020-03-06 DIAGNOSIS — I771 Stricture of artery: Secondary | ICD-10-CM | POA: Diagnosis not present

## 2020-03-06 DIAGNOSIS — M79606 Pain in leg, unspecified: Secondary | ICD-10-CM | POA: Diagnosis not present

## 2020-03-06 DIAGNOSIS — I351 Nonrheumatic aortic (valve) insufficiency: Secondary | ICD-10-CM | POA: Diagnosis not present

## 2020-03-06 DIAGNOSIS — I2510 Atherosclerotic heart disease of native coronary artery without angina pectoris: Secondary | ICD-10-CM | POA: Diagnosis not present

## 2020-03-25 DIAGNOSIS — J4540 Moderate persistent asthma, uncomplicated: Secondary | ICD-10-CM | POA: Diagnosis not present

## 2020-03-25 DIAGNOSIS — R053 Chronic cough: Secondary | ICD-10-CM | POA: Diagnosis not present

## 2020-03-25 DIAGNOSIS — J300 Vasomotor rhinitis: Secondary | ICD-10-CM | POA: Diagnosis not present

## 2020-04-03 DIAGNOSIS — M0579 Rheumatoid arthritis with rheumatoid factor of multiple sites without organ or systems involvement: Secondary | ICD-10-CM | POA: Diagnosis not present

## 2020-04-03 DIAGNOSIS — M159 Polyosteoarthritis, unspecified: Secondary | ICD-10-CM | POA: Diagnosis not present

## 2020-04-03 DIAGNOSIS — Z79899 Other long term (current) drug therapy: Secondary | ICD-10-CM | POA: Diagnosis not present

## 2020-04-03 DIAGNOSIS — Z6824 Body mass index (BMI) 24.0-24.9, adult: Secondary | ICD-10-CM | POA: Diagnosis not present

## 2020-04-03 DIAGNOSIS — M48061 Spinal stenosis, lumbar region without neurogenic claudication: Secondary | ICD-10-CM | POA: Diagnosis not present

## 2020-04-03 DIAGNOSIS — M353 Polymyalgia rheumatica: Secondary | ICD-10-CM | POA: Diagnosis not present

## 2020-04-07 DIAGNOSIS — K5904 Chronic idiopathic constipation: Secondary | ICD-10-CM | POA: Diagnosis not present

## 2020-04-07 DIAGNOSIS — K219 Gastro-esophageal reflux disease without esophagitis: Secondary | ICD-10-CM | POA: Diagnosis not present

## 2020-04-07 DIAGNOSIS — R140 Abdominal distension (gaseous): Secondary | ICD-10-CM | POA: Diagnosis not present

## 2020-04-18 DIAGNOSIS — M79672 Pain in left foot: Secondary | ICD-10-CM | POA: Diagnosis not present

## 2020-04-18 DIAGNOSIS — L600 Ingrowing nail: Secondary | ICD-10-CM | POA: Diagnosis not present

## 2020-04-24 DIAGNOSIS — I70213 Atherosclerosis of native arteries of extremities with intermittent claudication, bilateral legs: Secondary | ICD-10-CM | POA: Diagnosis not present

## 2020-04-24 DIAGNOSIS — Z79899 Other long term (current) drug therapy: Secondary | ICD-10-CM | POA: Diagnosis not present

## 2020-04-24 DIAGNOSIS — Z7901 Long term (current) use of anticoagulants: Secondary | ICD-10-CM | POA: Diagnosis not present

## 2020-04-24 DIAGNOSIS — I872 Venous insufficiency (chronic) (peripheral): Secondary | ICD-10-CM | POA: Diagnosis not present

## 2020-04-24 DIAGNOSIS — I739 Peripheral vascular disease, unspecified: Secondary | ICD-10-CM | POA: Diagnosis not present

## 2020-04-24 DIAGNOSIS — M7989 Other specified soft tissue disorders: Secondary | ICD-10-CM | POA: Diagnosis not present

## 2020-04-24 DIAGNOSIS — I4891 Unspecified atrial fibrillation: Secondary | ICD-10-CM | POA: Diagnosis not present

## 2020-05-03 DIAGNOSIS — R0609 Other forms of dyspnea: Secondary | ICD-10-CM | POA: Diagnosis not present

## 2020-05-03 DIAGNOSIS — F0390 Unspecified dementia without behavioral disturbance: Secondary | ICD-10-CM | POA: Diagnosis not present

## 2020-05-03 DIAGNOSIS — R002 Palpitations: Secondary | ICD-10-CM | POA: Diagnosis not present

## 2020-05-03 DIAGNOSIS — R1011 Right upper quadrant pain: Secondary | ICD-10-CM | POA: Diagnosis not present

## 2020-05-03 DIAGNOSIS — J45991 Cough variant asthma: Secondary | ICD-10-CM | POA: Diagnosis not present

## 2020-05-03 DIAGNOSIS — R001 Bradycardia, unspecified: Secondary | ICD-10-CM | POA: Diagnosis not present

## 2020-05-03 DIAGNOSIS — I2510 Atherosclerotic heart disease of native coronary artery without angina pectoris: Secondary | ICD-10-CM | POA: Diagnosis not present

## 2020-05-03 DIAGNOSIS — N281 Cyst of kidney, acquired: Secondary | ICD-10-CM | POA: Diagnosis not present

## 2020-05-03 DIAGNOSIS — I700 Atherosclerosis of aorta: Secondary | ICD-10-CM | POA: Diagnosis not present

## 2020-05-03 DIAGNOSIS — I110 Hypertensive heart disease with heart failure: Secondary | ICD-10-CM | POA: Diagnosis not present

## 2020-05-03 DIAGNOSIS — I509 Heart failure, unspecified: Secondary | ICD-10-CM | POA: Diagnosis not present

## 2020-05-03 DIAGNOSIS — R0602 Shortness of breath: Secondary | ICD-10-CM | POA: Diagnosis not present

## 2020-05-03 DIAGNOSIS — E1151 Type 2 diabetes mellitus with diabetic peripheral angiopathy without gangrene: Secondary | ICD-10-CM | POA: Diagnosis not present

## 2020-05-03 DIAGNOSIS — I4891 Unspecified atrial fibrillation: Secondary | ICD-10-CM | POA: Diagnosis not present

## 2020-05-04 DIAGNOSIS — I088 Other rheumatic multiple valve diseases: Secondary | ICD-10-CM | POA: Diagnosis not present

## 2020-05-04 DIAGNOSIS — E1139 Type 2 diabetes mellitus with other diabetic ophthalmic complication: Secondary | ICD-10-CM | POA: Insufficient documentation

## 2020-05-04 DIAGNOSIS — R1011 Right upper quadrant pain: Secondary | ICD-10-CM | POA: Diagnosis not present

## 2020-05-04 DIAGNOSIS — I10 Essential (primary) hypertension: Secondary | ICD-10-CM | POA: Diagnosis not present

## 2020-05-04 DIAGNOSIS — F03A0 Unspecified dementia, mild, without behavioral disturbance, psychotic disturbance, mood disturbance, and anxiety: Secondary | ICD-10-CM | POA: Insufficient documentation

## 2020-05-04 DIAGNOSIS — K589 Irritable bowel syndrome without diarrhea: Secondary | ICD-10-CM | POA: Insufficient documentation

## 2020-05-04 DIAGNOSIS — K760 Fatty (change of) liver, not elsewhere classified: Secondary | ICD-10-CM | POA: Insufficient documentation

## 2020-05-04 DIAGNOSIS — M353 Polymyalgia rheumatica: Secondary | ICD-10-CM | POA: Insufficient documentation

## 2020-05-04 DIAGNOSIS — R002 Palpitations: Secondary | ICD-10-CM | POA: Diagnosis not present

## 2020-05-04 DIAGNOSIS — I480 Paroxysmal atrial fibrillation: Secondary | ICD-10-CM | POA: Diagnosis not present

## 2020-05-04 DIAGNOSIS — R159 Full incontinence of feces: Secondary | ICD-10-CM | POA: Insufficient documentation

## 2020-05-04 DIAGNOSIS — I5032 Chronic diastolic (congestive) heart failure: Secondary | ICD-10-CM | POA: Diagnosis not present

## 2020-05-04 DIAGNOSIS — E119 Type 2 diabetes mellitus without complications: Secondary | ICD-10-CM | POA: Insufficient documentation

## 2020-05-04 DIAGNOSIS — R001 Bradycardia, unspecified: Secondary | ICD-10-CM | POA: Diagnosis not present

## 2020-05-04 DIAGNOSIS — I700 Atherosclerosis of aorta: Secondary | ICD-10-CM | POA: Diagnosis not present

## 2020-05-04 DIAGNOSIS — R0602 Shortness of breath: Secondary | ICD-10-CM | POA: Diagnosis not present

## 2020-05-04 DIAGNOSIS — H401131 Primary open-angle glaucoma, bilateral, mild stage: Secondary | ICD-10-CM | POA: Insufficient documentation

## 2020-05-04 DIAGNOSIS — I517 Cardiomegaly: Secondary | ICD-10-CM | POA: Diagnosis not present

## 2020-05-04 DIAGNOSIS — N281 Cyst of kidney, acquired: Secondary | ICD-10-CM | POA: Diagnosis not present

## 2020-05-04 DIAGNOSIS — G622 Polyneuropathy due to other toxic agents: Secondary | ICD-10-CM | POA: Insufficient documentation

## 2020-05-04 DIAGNOSIS — F419 Anxiety disorder, unspecified: Secondary | ICD-10-CM | POA: Insufficient documentation

## 2020-05-05 DIAGNOSIS — R001 Bradycardia, unspecified: Secondary | ICD-10-CM | POA: Diagnosis not present

## 2020-05-05 DIAGNOSIS — I480 Paroxysmal atrial fibrillation: Secondary | ICD-10-CM | POA: Diagnosis not present

## 2020-05-05 DIAGNOSIS — I10 Essential (primary) hypertension: Secondary | ICD-10-CM | POA: Diagnosis not present

## 2020-05-05 DIAGNOSIS — I5032 Chronic diastolic (congestive) heart failure: Secondary | ICD-10-CM | POA: Diagnosis not present

## 2020-05-06 DIAGNOSIS — R001 Bradycardia, unspecified: Secondary | ICD-10-CM | POA: Diagnosis not present

## 2020-05-06 DIAGNOSIS — I10 Essential (primary) hypertension: Secondary | ICD-10-CM | POA: Diagnosis not present

## 2020-05-06 DIAGNOSIS — R0609 Other forms of dyspnea: Secondary | ICD-10-CM | POA: Diagnosis not present

## 2020-05-06 DIAGNOSIS — I5032 Chronic diastolic (congestive) heart failure: Secondary | ICD-10-CM | POA: Diagnosis not present

## 2020-05-06 DIAGNOSIS — I480 Paroxysmal atrial fibrillation: Secondary | ICD-10-CM | POA: Diagnosis not present

## 2020-05-07 DIAGNOSIS — R001 Bradycardia, unspecified: Secondary | ICD-10-CM | POA: Diagnosis not present

## 2020-05-09 DIAGNOSIS — R001 Bradycardia, unspecified: Secondary | ICD-10-CM | POA: Diagnosis not present

## 2020-05-12 DIAGNOSIS — Z96611 Presence of right artificial shoulder joint: Secondary | ICD-10-CM | POA: Diagnosis not present

## 2020-05-12 DIAGNOSIS — M25511 Pain in right shoulder: Secondary | ICD-10-CM | POA: Diagnosis not present

## 2020-05-12 DIAGNOSIS — I110 Hypertensive heart disease with heart failure: Secondary | ICD-10-CM | POA: Diagnosis not present

## 2020-05-12 DIAGNOSIS — R071 Chest pain on breathing: Secondary | ICD-10-CM | POA: Diagnosis not present

## 2020-05-12 DIAGNOSIS — N281 Cyst of kidney, acquired: Secondary | ICD-10-CM | POA: Diagnosis not present

## 2020-05-12 DIAGNOSIS — N3000 Acute cystitis without hematuria: Secondary | ICD-10-CM | POA: Diagnosis not present

## 2020-05-12 DIAGNOSIS — Z955 Presence of coronary angioplasty implant and graft: Secondary | ICD-10-CM | POA: Diagnosis not present

## 2020-05-12 DIAGNOSIS — M1991 Primary osteoarthritis, unspecified site: Secondary | ICD-10-CM | POA: Diagnosis not present

## 2020-05-12 DIAGNOSIS — I509 Heart failure, unspecified: Secondary | ICD-10-CM | POA: Diagnosis not present

## 2020-05-12 DIAGNOSIS — R079 Chest pain, unspecified: Secondary | ICD-10-CM | POA: Diagnosis not present

## 2020-05-12 DIAGNOSIS — K5790 Diverticulosis of intestine, part unspecified, without perforation or abscess without bleeding: Secondary | ICD-10-CM | POA: Diagnosis not present

## 2020-05-12 DIAGNOSIS — F418 Other specified anxiety disorders: Secondary | ICD-10-CM | POA: Diagnosis not present

## 2020-05-12 DIAGNOSIS — I700 Atherosclerosis of aorta: Secondary | ICD-10-CM | POA: Diagnosis not present

## 2020-05-12 DIAGNOSIS — E119 Type 2 diabetes mellitus without complications: Secondary | ICD-10-CM | POA: Diagnosis not present

## 2020-05-12 DIAGNOSIS — R1011 Right upper quadrant pain: Secondary | ICD-10-CM | POA: Diagnosis not present

## 2020-05-12 DIAGNOSIS — R109 Unspecified abdominal pain: Secondary | ICD-10-CM | POA: Diagnosis not present

## 2020-05-12 DIAGNOSIS — I517 Cardiomegaly: Secondary | ICD-10-CM | POA: Diagnosis not present

## 2020-05-12 DIAGNOSIS — I2510 Atherosclerotic heart disease of native coronary artery without angina pectoris: Secondary | ICD-10-CM | POA: Diagnosis not present

## 2020-05-19 ENCOUNTER — Other Ambulatory Visit: Payer: Self-pay | Admitting: Internal Medicine

## 2020-05-19 DIAGNOSIS — R059 Cough, unspecified: Secondary | ICD-10-CM | POA: Diagnosis not present

## 2020-05-19 DIAGNOSIS — R911 Solitary pulmonary nodule: Secondary | ICD-10-CM

## 2020-05-19 DIAGNOSIS — R7989 Other specified abnormal findings of blood chemistry: Secondary | ICD-10-CM | POA: Diagnosis not present

## 2020-05-19 DIAGNOSIS — R002 Palpitations: Secondary | ICD-10-CM | POA: Diagnosis not present

## 2020-05-19 DIAGNOSIS — R946 Abnormal results of thyroid function studies: Secondary | ICD-10-CM | POA: Diagnosis not present

## 2020-05-22 DIAGNOSIS — Z03818 Encounter for observation for suspected exposure to other biological agents ruled out: Secondary | ICD-10-CM | POA: Diagnosis not present

## 2020-05-22 DIAGNOSIS — J069 Acute upper respiratory infection, unspecified: Secondary | ICD-10-CM | POA: Diagnosis not present

## 2020-05-27 ENCOUNTER — Encounter: Payer: Self-pay | Admitting: Radiology

## 2020-05-28 ENCOUNTER — Other Ambulatory Visit: Payer: Medicare HMO

## 2020-05-28 ENCOUNTER — Ambulatory Visit
Admission: RE | Admit: 2020-05-28 | Discharge: 2020-05-28 | Disposition: A | Discharge status: Home / Self Care | Payer: Medicare HMO | Source: Ambulatory Visit | Attending: Internal Medicine | Admitting: Internal Medicine

## 2020-05-28 DIAGNOSIS — J479 Bronchiectasis, uncomplicated: Secondary | ICD-10-CM | POA: Diagnosis not present

## 2020-05-28 DIAGNOSIS — I2510 Atherosclerotic heart disease of native coronary artery without angina pectoris: Secondary | ICD-10-CM | POA: Diagnosis not present

## 2020-05-28 DIAGNOSIS — R911 Solitary pulmonary nodule: Secondary | ICD-10-CM

## 2020-05-28 DIAGNOSIS — I700 Atherosclerosis of aorta: Secondary | ICD-10-CM | POA: Diagnosis not present

## 2020-05-28 DIAGNOSIS — R918 Other nonspecific abnormal finding of lung field: Secondary | ICD-10-CM | POA: Diagnosis not present

## 2020-05-29 DIAGNOSIS — I4891 Unspecified atrial fibrillation: Secondary | ICD-10-CM | POA: Diagnosis not present

## 2020-05-30 NOTE — Progress Notes (Signed)
Addenum to previous result note on CT from today (05/30/20) All questions were answered and patient expressed full understanding of CT results per Dr Melvyn Novas. Nothing further needed at this time

## 2020-05-30 NOTE — Progress Notes (Signed)
Called and went over CT results per Dr Melvyn Novas with patient. Confirmed upcoming scheduled office visit with Dr Melvyn Novas on 06/13/20. Nothing further needed at this time.

## 2020-06-03 ENCOUNTER — Other Ambulatory Visit: Payer: Self-pay

## 2020-06-03 ENCOUNTER — Ambulatory Visit: Payer: Medicare HMO | Admitting: Neurology

## 2020-06-03 ENCOUNTER — Encounter: Payer: Self-pay | Admitting: Neurology

## 2020-06-03 VITALS — BP 132/48 | HR 93 | Resp 20 | Ht 63.0 in | Wt 139.0 lb

## 2020-06-03 DIAGNOSIS — F03A0 Unspecified dementia, mild, without behavioral disturbance, psychotic disturbance, mood disturbance, and anxiety: Secondary | ICD-10-CM

## 2020-06-03 DIAGNOSIS — G629 Polyneuropathy, unspecified: Secondary | ICD-10-CM

## 2020-06-03 DIAGNOSIS — F0390 Unspecified dementia without behavioral disturbance: Secondary | ICD-10-CM | POA: Diagnosis not present

## 2020-06-03 MED ORDER — GABAPENTIN 100 MG PO CAPS: 100.0000 mg | ORAL_CAPSULE | Freq: Two times a day (BID) | ORAL | 11 refills | Status: DC

## 2020-06-03 NOTE — Patient Instructions (Signed)
Always good to see you!  1. Restart gabapentin 100mg : Take 1 capsule twice a day  2. Recommend using a walker at all times from now on. The neuropathy and knee problems are all affecting your balance.  3. Follow-up in 6 months, call for any changes

## 2020-06-03 NOTE — Progress Notes (Signed)
NEUROLOGY FOLLOW UP OFFICE NOTE  Lorraine Gilbert [id number] Mar 21, 1932  HISTORY OF PRESENT ILLNESS: I had the pleasure of seeing Lorraine Gilbert in follow-up in the neurology clinic on 06/03/2020.  The patient was last seen 10 months ago for memory loss, vertigo, and neuropathy. She is alone in the office today. Records and images were personally reviewed where available.  On her last visit, she reported worsening neuropathic pain. EMG/NCV of both legs done 08/2019 showed sensory polyneuropathy in both lower extremities, moderate in degree, new compared to prior study in 2019. She was started on gabapentin 100mg  BID for symptomatic treatment. She also underwent balance therapy due to frequent falls, last PT visit was in 09/2019.   She lives with her husband and son. She feels her memory is overall okay, she manages her own medications and finances without difficulties. She forgets things such as names or where she puts things. She does not drive. She ran out of gabapentin and now the burning in her legs is back. No side effects on gabapentin when she was taking it. She states her asthma and allergies are making her cough a lot. What bothers her most is dizziness. When asked to describe dizziness, she reports her balance is very, very poor, she would walk and go against the wall. She fell 2 weeks ago. She has a cane and walker, but uses her cane more. When she bends down, she cannot get up. Family closely monitors and helps her.   History on Initial Assessment 08/20/2014: This is a pleasant 87 yo RH woman with a history of atrial fibrillation on anticoagulation with Eliquis, hypertension, hyperlipidemia, diabetes, anxiety, vitamin D deficiency, polymyalgia rheumatica, who presented for evaluation of worsening memory. She started noticing symptoms over the past few months, mostly with short-term memory, she would forget things, occasionally forget her medications, or get disoriented when driving in unfamiliar  places. She has noticed some word-finding difficulties. There are times she would "feel lost" when in crowded places like the mall last week. She denies any missed bill payments, she cooks and drives without difficulties, no difficulties with ADLs.  She was found to have atrial fibrillation after episodes of syncope 2-3 years ago. She has had brief sharp pains in the occipital region lasting a few seconds occurring around once a week, with associated photophobia, no nausea/vomiting. She has occasional dizziness. She denies any vision changes except for blurred vision from cataracts and glaucoma. She has chronic neck and back pain. She has numbness and tingling in both hands, and occasional pain in her calves. She denies any bowel/bladder dysfunction, no anosmia or tremors. She reports her mother had "the beginning of Alzheimer's" at age 33. She had a head injury when younger, hit her head and was unconscious until she woke up in the hospital. She denies any alcohol intake.  Records and images were personally reviewed where available.  I personally reviewed MRI brain without contrast which did not show any acute changes. There was mild diffuse atrophy and mild chronic microvascular disease. TSH and B12 normal.   PAST MEDICAL HISTORY: Past Medical History:  Diagnosis Date  . A-fib ([place])   . Abnormal heart rhythm   . Allergic rhinitis   . Anxiety   . Asthma   . Diabetes mellitus without complication ([place])    Type II  . GERD (gastroesophageal reflux disease)   . Headache   . History of radiation therapy 07/05/2018-07/18/2018   left pelvis        Dr  Gery Pray  . Hypertension   . Neuromuscular disorder ([place])    Siactic pain in right leg  . Osteopenia   . Rheumatoid arthritis ([place])   . Rotator cuff tear arthropathy    Right  . Vertigo     MEDICATIONS: Current Outpatient Medications on File Prior to Visit  Medication Sig Dispense Refill  . ACCU-CHEK AVIVA PLUS test strip 1 each as  directed.    . Accu-Chek Softclix Lancets lancets 1 each by Other route as directed.    Marland Kitchen albuterol (VENTOLIN HFA) 108 (90 Base) MCG/ACT inhaler     . amiodarone (PACERONE) 200 MG tablet Take 1 tablet by mouth daily.    Marland Kitchen apixaban (ELIQUIS) 2.5 MG TABS tablet Take 2.5 mg by mouth 2 (two) times daily.     . benzonatate (TESSALON) 100 MG capsule Take by mouth.    . benzonatate (TESSALON) 200 MG capsule Take 1 capsule (200 mg total) by mouth every 6 (six) hours. 30 capsule 1  . Blood Glucose Calibration (ACCU-CHEK AVIVA) SOLN     . clopidogrel (PLAVIX) 75 MG tablet Take 75 mg by mouth daily.    Marland Kitchen escitalopram (LEXAPRO) 10 MG tablet Take 10 mg by mouth daily.    . fluticasone furoate-vilanterol (BREO ELLIPTA) 100-25 MCG/INH AEPB Inhale 1 puff into the lungs daily. (Patient taking differently: Inhale 1 puff into the lungs as needed. ) 14 each 0  . fluticasone furoate-vilanterol (BREO ELLIPTA) 100-25 MCG/INH AEPB Inhale 1 puff into the lungs daily. 14 each 0  . fluticasone furoate-vilanterol (BREO ELLIPTA) 100-25 MCG/INH AEPB Inhale 1 puff into the lungs daily. 28 each 0  . folic acid (FOLVITE) 1 MG tablet Take 1 mg by mouth every morning.     . gabapentin (NEURONTIN) 300 MG capsule Take 1 capsule (300 mg total) by mouth 3 (three) times daily. 90 capsule 2  . montelukast (SINGULAIR) 10 MG tablet     . Multiple Vitamin (MULTIVITAMIN WITH MINERALS) TABS tablet Take 1 tablet by mouth daily.    . nitroGLYCERIN (NITROSTAT) 0.4 MG SL tablet Place 0.4 mg under the tongue every 5 (five) minutes as needed for chest pain.     . pantoprazole (PROTONIX) 40 MG tablet Take 30- 60 min before your first and last meals of the day (Patient taking differently: daily. Take 30- 60 min before your first and last meals of the day) 60 tablet 2  . ticagrelor (BRILINTA) 90 MG TABS tablet Take 90 mg by mouth 2 (two) times daily.     . Vitamin D, Ergocalciferol, (DRISDOL) 1.25 MG (50000 UT) CAPS capsule Take 1 capsule by mouth  every Wednesday.     No current facility-administered medications on file prior to visit.    ALLERGIES: Allergies  Allergen Reactions  . Ibuprofen Other (See Comments)    Confusion, Patient states that the 800mg  Motrin made her feel like she was flying.    FAMILY HISTORY: Family History  Problem Relation Age of Onset  . Emphysema Father        smoked  . Heart disease Mother   . Heart attack Son   . Breast cancer Neg Hx     SOCIAL HISTORY: Social History   Socioeconomic History  . Marital status: Married    Spouse name: Kathi Ludwig  . Number of children: 6  . Years of education: Not on file  . Highest education level: Not on file  Occupational History  . Occupation: Retired Event organiser  Tobacco Use  .  Smoking status: Never Smoker  . Smokeless tobacco: Never Used  Vaping Use  . Vaping Use: Never used  Substance and Sexual Activity  . Alcohol use: No    Alcohol/week: 0.0 standard drinks  . Drug use: No  . Sexual activity: Not on file  Other Topics Concern  . Not on file  Social History Narrative   Right Handed   Lives with husband and son   One Story Home   Caffeine, Yes drinks coffee    Social Determinants of Health   Financial Resource Strain: Not on file  Food Insecurity: Not on file  Transportation Needs: Not on file  Physical Activity: Not on file  Stress: Not on file  Social Connections: Not on file  Intimate Partner Violence: Not on file     PHYSICAL EXAM: Vitals:   06/03/20 1446  BP: (!) 132/48  Pulse: 93  Resp: 20  SpO2: 97%   General: No acute distress Head:  Normocephalic/atraumatic Skin/Extremities: No rash, no edema Neurological Exam: alert and awake. No aphasia or dysarthria. Fund of knowledge is appropriate.  Recent and remote memory are impaired.  Attention and concentration are normal.   Cranial nerves: Pupils equal, round. Extraocular movements intact with no nystagmus. Visual fields full.  No facial asymmetry.  Motor: Bulk  and tone normal, muscle strength 5/5 throughout with no pronator drift.  Sensation intact to all modalities on both UE, decreased cold and vibration sense in a stocking distrubution, worse on left leg. Reflexes unable to elicit throughout. Finger to nose testing intact.  Gait unsteady, slow and cautious with cane.   IMPRESSION: This is a pleasant 87 yo RH woman with vascular risk factors including hypertension, hyperlipidemia, diabetes, atrial fibrillation on Eliquis, PMR, mild dementia, neuropathy. EMG/NCV confirmed moderate sensory neuropathy in both legs, she continues to report dizziness/balance difficulties. We again discussed that dizziness/balance problems are multifactorial due to neuropathy, peripheral vascular disease, and arthritis. She was advised to start using her walker at all times. Fall precautions discussed. She reports improvement of neuropathic pain with gabapentin 100mg  BID, refills sent. She feels memory is stable but is alone in the office with no family to corroborate history. Follow-up in 6 months, call for any changes.   Thank you for allowing me to participate in her care.  Please do not hesitate to call for any questions or concerns.   Ellouise Newer, M.D.   CC: Dr. Moreen Fowler

## 2020-06-04 DIAGNOSIS — R0981 Nasal congestion: Secondary | ICD-10-CM | POA: Diagnosis not present

## 2020-06-04 DIAGNOSIS — J019 Acute sinusitis, unspecified: Secondary | ICD-10-CM | POA: Diagnosis not present

## 2020-06-04 DIAGNOSIS — R059 Cough, unspecified: Secondary | ICD-10-CM | POA: Diagnosis not present

## 2020-06-04 DIAGNOSIS — J40 Bronchitis, not specified as acute or chronic: Secondary | ICD-10-CM | POA: Diagnosis not present

## 2020-06-04 NOTE — Progress Notes (Signed)
Radiation Oncology         [phone] ________________________________  Name: Lorraine Gilbert MRN: [id number]  Date: 06/05/2020  DOB: 04-02-1932  Follow-Up Visit Note  CC: [name], MD  [name], MD   Diagnosis:   Metastatic squamous cell carcinomapresenting in the left inguinal area (unknown primary site)  Interval Since Last Radiation:  1 year, 11 months  07/05/2018 - 07/18/2018:Left Inguinal / 30 Gy in 10 fractions   Narrative:  The patient returns today for routine follow-up .On 05/28/20 she received a chest CT at [place] showing the new ground-glass opacity seen on the previous study has resolved in the interval while 2 additional new sub solid lesions in the left upper lobe on the pervious study have nearly resolved. CT also showed stable appearance of the basilar predominant subpleural reticulation with mild diffuse cylindrical bronchiectasis.  Its worth mentioning that since her last visit, she was admitted to [facility] ED several times in the past 6 months. On 03/06/20 she underwent Angio-peripheral surgery at [place] (no other information available). As well as on 05/03/20, she was admitted to the [facility] ED, this time for symptomatic sinus bradycardia, and followed up [facility] on 05/07/20, ( no other information available.)  Patient was also seen at [facility] on 04/24/20 for a follow-up visit regarding her suspected clinically significant peripheral arterial disease associated with venous insufficiancy. Follow-up note also mentioned patient is on anticoagulants due to previously diagnosed atrial fibrillation.   Allergies:  is allergic to ibuprofen.  Meds: Current Outpatient Medications  Medication Sig Dispense Refill  . ACCU-CHEK AVIVA PLUS test strip 1 each as directed.    . Accu-Chek Softclix Lancets lancets 1 each by Other route as directed.    Marland Kitchen albuterol (VENTOLIN HFA) 108 (90 Base) MCG/ACT inhaler     . amiodarone  (PACERONE) 200 MG tablet Take 1 tablet by mouth daily.    Marland Kitchen apixaban (ELIQUIS) 2.5 MG TABS tablet Take 2.5 mg by mouth 2 (two) times daily.     . benzonatate (TESSALON) 100 MG capsule Take by mouth.    . Blood Glucose Calibration (ACCU-CHEK AVIVA) SOLN     . escitalopram (LEXAPRO) 10 MG tablet Take 10 mg by mouth daily.    . fluticasone furoate-vilanterol (BREO ELLIPTA) 100-25 MCG/INH AEPB Inhale 1 puff into the lungs daily. 28 each 0  . folic acid (FOLVITE) 1 MG tablet Take 1 mg by mouth every morning.     . gabapentin (NEURONTIN) 100 MG capsule Take 1 capsule (100 mg total) by mouth 2 (two) times daily. 60 capsule 11  . Multiple Vitamin (MULTIVITAMIN WITH MINERALS) TABS tablet Take 1 tablet by mouth daily.    . nitroGLYCERIN (NITROSTAT) 0.4 MG SL tablet Place 0.4 mg under the tongue every 5 (five) minutes as needed for chest pain.    . pantoprazole (PROTONIX) 40 MG tablet Take 30- 60 min before your first and last meals of the day (Patient taking differently: daily. Take 30- 60 min before your first and last meals of the day) 60 tablet 2  . Vitamin D, Ergocalciferol, (DRISDOL) 1.25 MG (50000 UT) CAPS capsule Take 1 capsule by mouth every Wednesday.    . benzonatate (TESSALON) 200 MG capsule Take 1 capsule (200 mg total) by mouth every 6 (six) hours. (Patient not taking: Reported on 06/05/2020) 30 capsule 1  . clopidogrel (PLAVIX) 75 MG tablet Take 75 mg by mouth daily. (Patient not taking: No sig reported)    .  fluticasone furoate-vilanterol (BREO ELLIPTA) 100-25 MCG/INH AEPB Inhale 1 puff into the lungs daily. (Patient not taking: Reported on 06/05/2020) 14 each 0  . fluticasone furoate-vilanterol (BREO ELLIPTA) 100-25 MCG/INH AEPB Inhale 1 puff into the lungs daily. (Patient not taking: Reported on 06/05/2020) 14 each 0  . montelukast (SINGULAIR) 10 MG tablet  (Patient not taking: Reported on 06/05/2020)    . ticagrelor (BRILINTA) 90 MG TABS tablet Take 90 mg by mouth 2 (two) times daily.  (Patient  not taking: Reported on 06/05/2020)     No current facility-administered medications for this encounter.    Physical Findings: The patient is in no acute distress. Patient is alert and oriented.  height is 5\' 3"  (1.6 m) and weight is 141 lb 4 oz (64.1 kg). Her temporal temperature is 96.8 F (36 C) (abnormal). Her blood pressure is 188/54 (abnormal) and her pulse is 66. Her respiration is 18 and oxygen saturation is 99%. .  No significant changes. Lungs are clear to auscultation bilaterally. Heart has regular rate and rhythm. No palpable cervical, supraclavicular, or axillary adenopathy. Abdomen soft, non-tender, normal bowel sounds. On pelvic exam the external genitalia unremarkable.  The inguinal areas are free of adenopathy.  A speculum exam is performed.  The vaginal mucosa is atrophic consistent consistent with her age of 55.  No mucosal lesions noted to the vaginal vault.  On bimanual examination there are no pelvic masses appreciated.  Rectal exam is formed.  Sphincter tone is noted to be normal.  Along the  distal left rectal wall above the anal canal there is some thickening.  Doubt this is malignancy but will need close follow-up.   Lab Findings: Lab Results  Component Value Date   WBC 7.7 08/07/2019   HGB 13.4 08/07/2019   HCT 40.3 08/07/2019   MCV 89.9 08/07/2019   PLT 301.0 08/07/2019    Radiographic Findings: CT Super D Chest Wo Contrast  Result Date: 05/29/2020 CLINICAL DATA:  Pulmonary nodule. EXAM: CT CHEST WITHOUT CONTRAST TECHNIQUE: Multidetector CT imaging of the chest was performed using thin slice collimation for electromagnetic bronchoscopy planning purposes, without intravenous contrast. COMPARISON:  11/14/2019 FINDINGS: Cardiovascular: The heart size is normal. No substantial pericardial effusion. Coronary artery calcification is evident. Atherosclerotic calcification is noted in the wall of the thoracic aorta. Mediastinum/Nodes: No mediastinal lymphadenopathy. No  evidence for gross hilar lymphadenopathy although assessment is limited by the lack of intravenous contrast on today's study. The esophagus has normal imaging features. There is no axillary lymphadenopathy. Lungs/Pleura: Basilar predominant subpleural reticulation again noted with mild diffuse cylindrical bronchiectasis. 2.4 cm ground-glass opacity seen previously in the medial left upper lobe has resolved in the interval. A second 7 mm ground-glass nodule in the peripheral aspect of the anterior left upper lobe has also resolved with a tiny 2-3 mm nodular lesion at this location today (axial image 43/series 8). The 1.3 x 1.0 cm reticular and nodular opacity identified in the peripheral left upper lobe previously has nearly resolved with only some subtle architectural distortion at this location today (34/8). No new suspicious pulmonary nodule or mass. No focal airspace consolidation. No pleural effusion. Upper Abdomen: Unremarkable. Musculoskeletal: No worrisome lytic or sclerotic osseous abnormality. Mild superior endplate compression deformity at L1 is stable. IMPRESSION: 1. Dominant new ground-glass opacity seen on the previous study has resolved in the interval while 2 additional new sub solid lesions in the left upper lobe on the previous study have nearly resolved. No new suspicious pulmonary nodule or mass  on today's study. 2. Stable appearance of the basilar predominant subpleural reticulation with mild diffuse cylindrical bronchiectasis. Imaging features remain compatible with fibrotic lung disease. 3. Stable superior endplate compression deformity at L1. 4. Aortic Atherosclerosis (ICD10-I70.0). Electronically Signed   By: Misty Stanley M.D.   On: 05/29/2020 11:39    Impression:  Metastatic squamous cell carcinomapresenting in the left inguinal area (unknown primary site)  No evidence of recurrence on clinical exam today.  Patient has some thickening along the distal left lateral rectal wall which I  did not appreciate on previous exams.   Plan: Patient will return for short interval follow-up in 3 months.  She will have a pelvic exam again at that time.  If continues to have changes along the distal rectum above the anal canal then we will make referral to general surgery or gastroenterology for further evaluation.   20 minutes of total time was spent for this patient encounter, including preparation, face-to-face counseling with the patient and coordination of care, physical exam, and documentation of the encounter. ____________________________________  Blair Promise, PhD, MD   This document serves as a record of services personally performed by Gery Pray, MD. It was created on his behalf by Roney Mans, a trained medical scribe. The creation of this record is based on the scribe's personal observations and the provider's statements to them. This document has been checked and approved by the attending provider.

## 2020-06-05 ENCOUNTER — Other Ambulatory Visit: Payer: Self-pay

## 2020-06-05 ENCOUNTER — Ambulatory Visit
Admission: RE | Admit: 2020-06-05 | Discharge: 2020-06-05 | Disposition: A | Discharge status: Home / Self Care | Payer: Medicare HMO | Source: Ambulatory Visit | Attending: Radiation Oncology | Admitting: Radiation Oncology

## 2020-06-05 ENCOUNTER — Encounter: Payer: Self-pay | Admitting: Radiation Oncology

## 2020-06-05 VITALS — BP 188/54 | HR 66 | Temp 96.8°F | Resp 18 | Ht 63.0 in | Wt 141.2 lb

## 2020-06-05 DIAGNOSIS — Z7901 Long term (current) use of anticoagulants: Secondary | ICD-10-CM | POA: Insufficient documentation

## 2020-06-05 DIAGNOSIS — Z8589 Personal history of malignant neoplasm of other organs and systems: Secondary | ICD-10-CM | POA: Diagnosis not present

## 2020-06-05 DIAGNOSIS — Z79899 Other long term (current) drug therapy: Secondary | ICD-10-CM | POA: Insufficient documentation

## 2020-06-05 DIAGNOSIS — Z923 Personal history of irradiation: Secondary | ICD-10-CM | POA: Diagnosis not present

## 2020-06-05 DIAGNOSIS — J479 Bronchiectasis, uncomplicated: Secondary | ICD-10-CM | POA: Insufficient documentation

## 2020-06-05 DIAGNOSIS — C801 Malignant (primary) neoplasm, unspecified: Secondary | ICD-10-CM

## 2020-06-05 DIAGNOSIS — Z7951 Long term (current) use of inhaled steroids: Secondary | ICD-10-CM | POA: Diagnosis not present

## 2020-06-05 DIAGNOSIS — C774 Secondary and unspecified malignant neoplasm of inguinal and lower limb lymph nodes: Secondary | ICD-10-CM | POA: Diagnosis not present

## 2020-06-05 DIAGNOSIS — C779 Secondary and unspecified malignant neoplasm of lymph node, unspecified: Secondary | ICD-10-CM

## 2020-06-05 DIAGNOSIS — I4891 Unspecified atrial fibrillation: Secondary | ICD-10-CM | POA: Diagnosis not present

## 2020-06-05 DIAGNOSIS — Z08 Encounter for follow-up examination after completed treatment for malignant neoplasm: Secondary | ICD-10-CM | POA: Diagnosis not present

## 2020-06-05 NOTE — Progress Notes (Signed)
Lorraine Gilbert is here today for follow up post radiation to the pelvic.  They completed their radiation on: 07/18/2018   Does the patient complain of any of the following:  . Pain:denies . Abdominal bloating: occasionally . Diarrhea/Constipation: constipation . Nausea/Vomiting: denies . Vaginal Discharge: denies . Blood in Urine or Stool: denies . Urinary Issues (dysuria/incomplete emptying/ incontinence/ increased frequency/urgency): reports recent urinary tract infection treated with antibiotics. Reports urgency, denies frequency, incontinence or inability to empty bladder completely.  . Reports poor sleep due to cough. . Reports poor appetite due to cough.   Additional comments if applicable: none at this time   Vitals:   06/05/20 1549  BP: (!) 188/54  Pulse: 66  Resp: 18  Temp: (!) 96.8 F (36 C)  TempSrc: Temporal  SpO2: 99%  Weight: 141 lb 4 oz (64.1 kg)  Height: 5\' 3"  (1.6 m)

## 2020-06-06 ENCOUNTER — Telehealth: Payer: Self-pay | Admitting: *Deleted

## 2020-06-06 NOTE — Telephone Encounter (Signed)
Called patient to inform of fu with Dr. Sondra Come on 09-15-20 @ 3 pm, spoke with patient and she is aware of this appt.

## 2020-06-10 ENCOUNTER — Ambulatory Visit: Payer: Medicare HMO | Admitting: Internal Medicine

## 2020-06-10 DIAGNOSIS — Z79899 Other long term (current) drug therapy: Secondary | ICD-10-CM | POA: Diagnosis not present

## 2020-06-10 DIAGNOSIS — E663 Overweight: Secondary | ICD-10-CM | POA: Diagnosis not present

## 2020-06-10 DIAGNOSIS — M353 Polymyalgia rheumatica: Secondary | ICD-10-CM | POA: Diagnosis not present

## 2020-06-10 DIAGNOSIS — M7989 Other specified soft tissue disorders: Secondary | ICD-10-CM | POA: Diagnosis not present

## 2020-06-10 DIAGNOSIS — M159 Polyosteoarthritis, unspecified: Secondary | ICD-10-CM | POA: Diagnosis not present

## 2020-06-10 DIAGNOSIS — M0579 Rheumatoid arthritis with rheumatoid factor of multiple sites without organ or systems involvement: Secondary | ICD-10-CM | POA: Diagnosis not present

## 2020-06-10 DIAGNOSIS — Z6825 Body mass index (BMI) 25.0-25.9, adult: Secondary | ICD-10-CM | POA: Diagnosis not present

## 2020-06-10 DIAGNOSIS — M48061 Spinal stenosis, lumbar region without neurogenic claudication: Secondary | ICD-10-CM | POA: Diagnosis not present

## 2020-06-11 DIAGNOSIS — I471 Supraventricular tachycardia: Secondary | ICD-10-CM | POA: Diagnosis not present

## 2020-06-11 DIAGNOSIS — I493 Ventricular premature depolarization: Secondary | ICD-10-CM | POA: Diagnosis not present

## 2020-06-12 DIAGNOSIS — E1139 Type 2 diabetes mellitus with other diabetic ophthalmic complication: Secondary | ICD-10-CM | POA: Diagnosis not present

## 2020-06-12 DIAGNOSIS — E1165 Type 2 diabetes mellitus with hyperglycemia: Secondary | ICD-10-CM | POA: Diagnosis not present

## 2020-06-12 DIAGNOSIS — I480 Paroxysmal atrial fibrillation: Secondary | ICD-10-CM | POA: Diagnosis not present

## 2020-06-12 DIAGNOSIS — I4891 Unspecified atrial fibrillation: Secondary | ICD-10-CM | POA: Diagnosis not present

## 2020-06-12 DIAGNOSIS — K219 Gastro-esophageal reflux disease without esophagitis: Secondary | ICD-10-CM | POA: Diagnosis not present

## 2020-06-12 DIAGNOSIS — M069 Rheumatoid arthritis, unspecified: Secondary | ICD-10-CM | POA: Diagnosis not present

## 2020-06-12 DIAGNOSIS — J45909 Unspecified asthma, uncomplicated: Secondary | ICD-10-CM | POA: Diagnosis not present

## 2020-06-12 DIAGNOSIS — E7800 Pure hypercholesterolemia, unspecified: Secondary | ICD-10-CM | POA: Diagnosis not present

## 2020-06-12 DIAGNOSIS — I2510 Atherosclerotic heart disease of native coronary artery without angina pectoris: Secondary | ICD-10-CM | POA: Diagnosis not present

## 2020-06-13 ENCOUNTER — Other Ambulatory Visit: Payer: Self-pay

## 2020-06-13 ENCOUNTER — Ambulatory Visit: Payer: Medicare HMO | Admitting: Internal Medicine

## 2020-06-13 ENCOUNTER — Encounter: Payer: Self-pay | Admitting: Internal Medicine

## 2020-06-13 DIAGNOSIS — J45991 Cough variant asthma: Secondary | ICD-10-CM

## 2020-06-13 DIAGNOSIS — R911 Solitary pulmonary nodule: Secondary | ICD-10-CM

## 2020-06-13 DIAGNOSIS — R058 Other specified cough: Secondary | ICD-10-CM | POA: Diagnosis not present

## 2020-06-13 MED ORDER — FAMOTIDINE 20 MG PO TABS: ORAL_TABLET | ORAL | 11 refills | Status: DC

## 2020-06-13 MED ORDER — PANTOPRAZOLE SODIUM 40 MG PO TBEC: DELAYED_RELEASE_TABLET | ORAL | Status: DC

## 2020-06-13 NOTE — Assessment & Plan Note (Addendum)
-   2 small pulmonary nodules noted to left upper lobe on  PET scan 03/23/18  (60mm LUL nodule not hypermetabolic). Very low metabolic activity, not consistent with primary source - CT 03/02/19:  1. Scattered ground-glass nodularity bilaterally, greatest in the lower lobes subpleural distribution. Overall, pattern suggest inflammatory or infectious change. 2. The left upper lobe nodules reported on prior PET scan are no longer identified. 3. Likely reactive borderline enlarged mediastinal and right hilar lymph nodes. No frankly pathologic adenopathy. -  CT chest 05/28/20  Resolved or resolving nodules  (likely RA related)  No pulmonary f/u needed for this problem

## 2020-06-13 NOTE — Assessment & Plan Note (Signed)
-   Note POS GERD on UGI  02/28/13  - 01/06/2017  added h2 and 1st gen H1 blockers per guidelines  At HS > f/u prn  - 04/21/2018  No benefit from ICS/LAMA. Symptoms facor UACS. Take protonix 40mg  TWICE daily. Take Chlorpheniramine 4mg  every 4 hours as needed for cough. Refill tessalon perles for cough, continue delsym cough syrup and add mucinex twice a day  - titrate up gabapentin to max 300 mg tid/ bed blocks/ avoid mints in favor of jolly ranchers > refer to  ENT / allergy in [place]   Upper airway cough syndrome (previously labeled PNDS),  is so named because it's frequently impossible to sort out how much is  CR/sinusitis with freq throat clearing (which can be related to primary GERD)   vs  causing  secondary (" extra esophageal")  GERD from wide swings in gastric pressure that occur with throat clearing, often  promoting self use of mint and menthol lozenges that reduce the lower esophageal sphincter tone and exacerbate the problem further in a cyclical fashion.   These are the same pts (now being labeled as having "irritable larynx syndrome" by some cough centers) who not infrequently have a history of having failed to tolerate ace inhibitors,  dry powder inhalers or biphosphonates or report having atypical/extraesophageal reflux symptoms that don't respond to standard doses of PPI  and are easily confused as having aecopd or asthma flares by even experienced allergists/ pulmonologists (myself included).   rec max rx for gerd and titrate up gabapentin to 300 mg tid or whatever the lowest dose is that controls the cough / pulmonary f/u is prn

## 2020-06-13 NOTE — Patient Instructions (Addendum)
Your cough is coming from your throat not your lungs and you have an allergy/asthma doctor and ENT doctor in [place] so no need for pulmonary medicine follow up here.  Pantoprazole (protonix) 40 mg   Take  30-60 min before first meal of the day and Pepcid (famotidine)  20 mg after supper until you are seen by your ent doctor and allergist for their input    Best medication is gabapentin 300 mg twice daily (start with 100 x 2 twice daily) and if not better push up to 300 mg three times daily and call me with  The total dose you use per day for new prescription    If get more short of breath when not coughing >  Albuterol every 4 hours as need until you see your allergist but you must work on your technique:     relax and gently blow all the way out then take a nice smooth full deep breath back in, triggering the inhaler at same time you start breathing in.  Hold for up to 5 seconds if you can. Rinse and gargle with water when done.  If mouth or throat bother you at all,  try brushing teeth/gums/tongue with arm and hammer toothpaste/ make a slurry and gargle and spit out.       Pulmonary follow up is as needed - you will need to bring all active medications if your want my help getting you on a better medication plan

## 2020-06-13 NOTE — Progress Notes (Signed)
Subjective:   Patient ID: Lorraine Gilbert, female    DOB: May 01, 1932  MRN: [id number]        Brief patient profile:  88  yf from [place]  never smoker with cough and sob x decades previously eval by Dr Bernita Buffy referred by Dr Moreen Fowler 07/24/2012 for further evaluation of sob and cough with perfectly nl pfts 12/04/2013     History of Present Illness  07/24/2012 1st pulmonary eval cc cough x 2 months indolent onset progressively worse that is not better on advair, min prod of white mucus more day than night, assoc with mild sob. rec Stop advair Start dulera 100 Take 2 puffs first thing in am and then another 2 puffs about 12 hours later.  Pantoprazole (protonix) 40 mg  Take 30-60 min before first meal of the day and Pepcid 20 mg one bedtime until return to office - this is the best way to tell whether stomach acid is contributing to your problem.   GERD   If not better return here in 2 weeks all bottles/inhalers from all doctors or return to [facility] and if you want to change to [facility] we will need you to bring your records with you as well >> did not return as rec      01/27/2018  f/u ov/Lorraine Gilbert re: uacs improved on 1st gen H1 blockers per guidelines / finally brought meds as req   Chief Complaint  Patient presents with  . Follow-up    Her breathing is unchanged. She states still having hoarseness. No new co's.    Dyspnea:  MMRC2 = can't walk a nl pace on a flat grade s sob but does fine slow and flat eg shopping fine  Cough: in am only a cough or two  then ok  The rest of the day  Sleeping: flat ok  SABA use: none  rec Continue For drainage / throat tickle try take CHLORPHENIRAMINE  4 mg - take one every 4 hours as needed     06/22/2018 acute extended ov/Lorraine Gilbert re:  Flare of asthma off ics/laba Chief Complaint  Patient presents with  . Acute Visit    Increased SOB x 2 months- out of symbicort for about that time.   covid pcr neg 06/05/18  Doe x 100 ft =  MMRC3 = can't  walk 100 yards even at a slow pace at a flat grade s stopping due to sob No cough  Sleeping on side/ 2 pillows/ bed iflat  No 02    Very confused with meds, poor correlation with those she brought vs what her med st indicates she takes  rec Resume dulera 100 Take 2 puffs first thing in am and then another 2 puffs about 12 hours later.  Work on inhaler technique:  relax and gently blow all the way out then take a nice smooth deep breath back in, triggering the inhaler at same time you start breathing in.  Hold for up to 5 seconds if you can. Blow out thru nose. Rinse and gargle with water when done If not better or can't afford dulera 100  >>> return in 2 weeks    07/12/2018  f/u ov/Lorraine Gilbert re: asthma/ not aderent due to cost of care / brought meds but not med calendar Chief Complaint  Patient presents with  . Follow-up    Breathing has improved some. She coughs some at night, occ will wake her up.   Dyspnea:  Does fine as long as access  to dulera / much worse when runs out, has 36 doses left on one we gave her last ov Cough: no excess mucus Sleeping: better / sleeps flat with 2 pillows - only wakes up if doesn't take pm dulera SABA use: min  02: none rec Breo 100 take one puff each am  Please schedule a follow up office visit in 4 weeks   08/11/2018  f/u ov/Lorraine Gilbert re: asthma/ non adeherent  ? Number on sample- the one med she  did not bring as req  Chief Complaint  Patient presents with  . Follow-up    Breathing is doing well and no new co's.   Dyspnea:  MMRC1 = can walk nl pace, flat grade, can't hurry or go uphills or steps s sob  Cough: some noct but mostly sporadic, daytime, dry   Sleeping: fine once asleep bed is flat SABA use: none 02: none  rec Continue BREO one click each am  If not able to afford BREO, you will need to return to see one of our NP 's with your drug formulary in hand to pick a cheaper alternative > did not return "lives off samples"     12/06/2018  f/u  ov/Lorraine Gilbert re:  Cough variant asthma/ maint on amiodarone  Chief Complaint  Patient presents with  . Acute Visit    Pt c/o cough and wheezing x 3 wks "ithcy cough"- non prod. She ran out of Breo 2 wks ago due to cost.    Dyspnea:  Worse off breo = MMRC3 = can't walk 100 yards even at a slow pace at a flat grade s stopping due to sob  Cough: worse off breo day > noct, dry Sleeping: bed is flat / sleeps on 2 pillows  SABA use: none  02: none  rec Continue BREO one click each am x 2 week sample  In two weeks  you will need to return to see one of our NP 's with your drug formulary in hand to pick a cheaper alternative - pick a day when our pharmacist will be here to help you  Please schedule a follow up office visit in 6 months , call sooner if needed with all medications /inhalers/ solutions in hand so we can verify exactly what you are taking. This includes all medications from all doctors and over the [place] separate them into two bags:  the ones you take automatically, no matter what, vs the ones you take just when you feel you need them "BAG #2 is UP TO YOU"  - this will really help [place] help you take your medications more effectively.    11/12/2019  f/u ov/Lorraine Gilbert re: cough variant asthma better  Chief Complaint  Patient presents with  . Follow-up    Dry cough  Dyspnea:  Cough takes breath away  Cough: dry  Keeps her up  Sleeping: bed is flat/ 3 pillows SABA use: once  A day  02: none  rec GERD diet  It is very unlikely that asthma is making you cough and BREO is not helping either so I advised you stop BREO Your cough is coming from your throat not your lungs  Best medication is gabapentin 300 mg twice daily (start with 100 x 2 twice daily) and if not better push up to 300 mg three times daily and call for referral to Dr Carol Ada at [facility]NT at [facility]/ voice center  as needed  For cough > tessalon 200 mg every 6 -  8 h hours If get more short of breath when not coughing >   Albuterol every 4 hours as need until you see your allergist.  Pulmonary follow up is as needed   06/13/2020  f/u ov/Lorraine Gilbert re: UACS with flare of  Cough / wheeze/ off breo for months with very poor hfa  Chief Complaint  Patient presents with  . Follow-up    Increased wheezing x 2 wks. She is coughing with yellow sputum x 1 wk. She states not able to afford her Breo and uses her albuterol inhaler about 3 x per day.   Dyspnea:  Across parking lot and into large store no albuterol today  Cough: sporadic turing yellow x one week Sleeping: bed if flat one pillow  SABA use: last albuterol 10 am 5/26   Close to 0% effective technique  02: none  Covid status:  X 3    No obvious day to day or daytime variability or assoc  mucus plugs or hemoptysis or cp or chest tightness, subjective wheeze or overt sinus or hb symptoms.    . Also denies any obvious fluctuation of symptoms with weather or environmental changes or other aggravating or alleviating factors except as outlined above   No unusual exposure hx or h/o childhood pna/ asthma or knowledge of premature birth.  Current Allergies, Complete Past Medical History, Past Surgical History, Family History, and Social History were reviewed in [employer] record.  ROS  The following are not active complaints unless bolded Hoarseness, sore throat, dysphagia, dental problems, itching, sneezing    nasal congestion or discharge of excess mucus or purulent secretions, ear ache,   fever, chills, sweats, unintended wt loss or wt gain, classically pleuritic or exertional cp,  orthopnea pnd or arm/hand swelling  or leg swelling, presyncope, palpitations, abdominal pain, anorexia, nausea, vomiting, diarrhea  or change in bowel habits or change in bladder habits, change in stools or change in urine, dysuria, hematuria,  rash, arthralgias, visual complaints, headache, numbness, weakness or ataxia or problems with walking or coordination,  change  in mood or  memory.        Current Meds  Medication Sig  . ACCU-CHEK AVIVA PLUS test strip 1 each as directed.  . Accu-Chek Softclix Lancets lancets 1 each by Other route as directed.  Marland Kitchen albuterol (VENTOLIN HFA) 108 (90 Base) MCG/ACT inhaler   . amiodarone (PACERONE) 200 MG tablet Take 1 tablet by mouth daily.  Marland Kitchen apixaban (ELIQUIS) 2.5 MG TABS tablet Take 2.5 mg by mouth 2 (two) times daily.   . Blood Glucose Calibration (ACCU-CHEK AVIVA) SOLN   . clopidogrel (PLAVIX) 75 MG tablet Take 75 mg by mouth daily.  Marland Kitchen escitalopram (LEXAPRO) 10 MG tablet Take 10 mg by mouth daily.  . folic acid (FOLVITE) 1 MG tablet Take 1 mg by mouth every morning.   . gabapentin (NEURONTIN) 100 MG capsule Take 1 capsule (100 mg total) by mouth 2 (two) times daily.  . montelukast (SINGULAIR) 10 MG tablet   . Multiple Vitamin (MULTIVITAMIN WITH MINERALS) TABS tablet Take 1 tablet by mouth daily.  . nitroGLYCERIN (NITROSTAT) 0.4 MG SL tablet Place 0.4 mg under the tongue every 5 (five) minutes as needed for chest pain.  . pantoprazole (PROTONIX) 40 MG tablet Take 30- 60 min before your first and last meals of the day (Patient taking differently: daily. Take 30- 60 min before your first and last meals of the day)  . ticagrelor (BRILINTA) 90 MG TABS tablet  Take 90 mg by mouth 2 (two) times daily.  . Vitamin D, Ergocalciferol, (DRISDOL) 1.25 MG (50000 UT) CAPS capsule Take 1 capsule by mouth every Wednesday.                      Objective:  Physical Exam   06/13/2020      141 11/12/2019    141  12/06/2018    130  08/14/2014        138 >  04/24/2015   137> 08/08/2015  136 > 02/09/2016  132 >  11/15/2016    137 > 01/06/2017  133 > 01/05/2018   134 > 01/27/2018   133 >  07/12/2018  129    Vital signs reviewed  06/13/2020  - Note at rest 02 sats  99% on RA   General appearance:    amb somber hispanic wf nad / good voice texture but prominent pseudowheeze present    HEENT : pt wearing mask not removed for exam due  to covid -19 concerns.    NECK :  without JVD/Nodes/TM/ nl carotid upstrokes bilaterally   LUNGS: no acc muscle use,  Nl contour chest which is clear to A and P bilaterally without cough on insp or exp maneuvers   CV:  RRR  no s3 or murmur or increase in P2, and no edema   ABD:  soft and nontender with nl inspiratory excursion in the supine position. No bruits or organomegaly appreciated, bowel sounds nl  MS:  Nl gait/ ext warm without deformities, calf tenderness, cyanosis or clubbing No obvious joint restrictions   SKIN: warm and dry without lesions    NEURO:  alert, approp, nl sensorium with  no motor or cerebellar deficits apparent.        I personally reviewed images and agree with radiology impression as follows:   Chest CT w/o contrast  05/28/20  1. Dominant new ground-glass opacity seen on the previous study has resolved in the interval while 2 additional new sub solid lesions in the left upper lobe on the previous study have nearly resolved. No new suspicious pulmonary nodule or mass on today's study. 2. Stable appearance of the basilar predominant subpleural reticulation with mild diffuse cylindrical bronchiectasis. Imaging features remain compatible with fibrotic lung disease. 3. Stable superior endplate compression deformity at L1. 4. Aortic Atherosclerosis (ICD10-I70.0).     Assessment & Plan:

## 2020-06-13 NOTE — Assessment & Plan Note (Signed)
.  Onset decades prior to initial pulmonary eval 2014  - 10/18/2013  resume trial of dulera 100 2bid  -Med calendar 11/01/2013 > did not bring as instructed 12/04/13 - PFTs wnl 12/04/13 > ok to try off dulera - flare off dulera 08/14/14 > ok to resume dulera 100 2bid  - Spirometry 04/24/2015  wnl off dulera / even fef 25-75 - NO 04/24/2015  = 17  - 11/15/2016  try symbicort 80 2bid  - PFT's  01/06/2017  FEV1 1.59 (101 % ) ratio 86  p 4 % improvement from saba p symb 80 x 2 x 12h  prior to study with DLCO  67/68c % corrects to 100 % for alv volume   - 01/27/2018 cough resolved maint on just 1st gen H1 blockers per guidelines  And off inhalers  - 06/22/2018 flared off dulera > resume   - 07/12/2018   try BREO 100 one am for insurance purposes  - 12/06/2018  After extensive coaching inhaler device,  effectiveness =    90% with elipta > give breo 100 x 2 week sample then return with formulary to see NP/pharmacist for best choice. - PFT's  11/12/2019  FEV1 1.36 (91 % ) ratio 0.84  p 2 % improvement from saba p 0 prior to study with DLCO  11.31 (67%) corrects to 4.83 (116%)  for alv volume and FV curve nl   - d/c'd breo 11/12/19 > ov 06/13/2020 with flare of "wheeze and cough" with nl exam > leave off breo and max rx for gerd/ cyclical coughing  With f/u WS allergy/ent   see avs for instructions unique to this ov    F/u here prn          Each maintenance medication was reviewed in detail including emphasizing most importantly the difference between maintenance and prns and under what circumstances the prns are to be triggered using an action plan format where appropriate.  Total time for H and P, chart review, counseling, reviewing hfa  device(s) and generating customized AVS unique to this office visit / same day charting  > 30 min

## 2020-06-19 DIAGNOSIS — I2510 Atherosclerotic heart disease of native coronary artery without angina pectoris: Secondary | ICD-10-CM | POA: Diagnosis not present

## 2020-06-19 DIAGNOSIS — I351 Nonrheumatic aortic (valve) insufficiency: Secondary | ICD-10-CM | POA: Diagnosis not present

## 2020-06-19 DIAGNOSIS — E039 Hypothyroidism, unspecified: Secondary | ICD-10-CM | POA: Diagnosis not present

## 2020-06-19 DIAGNOSIS — I10 Essential (primary) hypertension: Secondary | ICD-10-CM | POA: Diagnosis not present

## 2020-06-19 DIAGNOSIS — F419 Anxiety disorder, unspecified: Secondary | ICD-10-CM | POA: Diagnosis not present

## 2020-06-19 DIAGNOSIS — I480 Paroxysmal atrial fibrillation: Secondary | ICD-10-CM | POA: Diagnosis not present

## 2020-06-19 DIAGNOSIS — R001 Bradycardia, unspecified: Secondary | ICD-10-CM | POA: Diagnosis not present

## 2020-06-19 DIAGNOSIS — R002 Palpitations: Secondary | ICD-10-CM | POA: Diagnosis not present

## 2020-07-01 DIAGNOSIS — R059 Cough, unspecified: Secondary | ICD-10-CM | POA: Diagnosis not present

## 2020-07-01 DIAGNOSIS — H6123 Impacted cerumen, bilateral: Secondary | ICD-10-CM | POA: Diagnosis not present

## 2020-07-03 DIAGNOSIS — R946 Abnormal results of thyroid function studies: Secondary | ICD-10-CM | POA: Diagnosis not present

## 2020-07-07 DIAGNOSIS — J986 Disorders of diaphragm: Secondary | ICD-10-CM | POA: Diagnosis not present

## 2020-07-07 DIAGNOSIS — T7849XA Other allergy, initial encounter: Secondary | ICD-10-CM | POA: Diagnosis not present

## 2020-07-07 DIAGNOSIS — J302 Other seasonal allergic rhinitis: Secondary | ICD-10-CM | POA: Diagnosis not present

## 2020-07-07 DIAGNOSIS — I509 Heart failure, unspecified: Secondary | ICD-10-CM | POA: Diagnosis not present

## 2020-07-07 DIAGNOSIS — E785 Hyperlipidemia, unspecified: Secondary | ICD-10-CM | POA: Diagnosis not present

## 2020-07-07 DIAGNOSIS — R059 Cough, unspecified: Secondary | ICD-10-CM | POA: Diagnosis not present

## 2020-07-07 DIAGNOSIS — F0390 Unspecified dementia without behavioral disturbance: Secondary | ICD-10-CM | POA: Diagnosis not present

## 2020-07-07 DIAGNOSIS — I2510 Atherosclerotic heart disease of native coronary artery without angina pectoris: Secondary | ICD-10-CM | POA: Diagnosis not present

## 2020-07-07 DIAGNOSIS — R053 Chronic cough: Secondary | ICD-10-CM | POA: Diagnosis not present

## 2020-07-07 DIAGNOSIS — E1139 Type 2 diabetes mellitus with other diabetic ophthalmic complication: Secondary | ICD-10-CM | POA: Diagnosis not present

## 2020-07-07 DIAGNOSIS — I110 Hypertensive heart disease with heart failure: Secondary | ICD-10-CM | POA: Diagnosis not present

## 2020-07-07 DIAGNOSIS — R9082 White matter disease, unspecified: Secondary | ICD-10-CM | POA: Diagnosis not present

## 2020-07-07 DIAGNOSIS — R058 Other specified cough: Secondary | ICD-10-CM | POA: Diagnosis not present

## 2020-07-07 DIAGNOSIS — I739 Peripheral vascular disease, unspecified: Secondary | ICD-10-CM | POA: Diagnosis not present

## 2020-07-07 DIAGNOSIS — I4891 Unspecified atrial fibrillation: Secondary | ICD-10-CM | POA: Diagnosis not present

## 2020-07-09 DIAGNOSIS — M0579 Rheumatoid arthritis with rheumatoid factor of multiple sites without organ or systems involvement: Secondary | ICD-10-CM | POA: Diagnosis not present

## 2020-07-09 DIAGNOSIS — Z79899 Other long term (current) drug therapy: Secondary | ICD-10-CM | POA: Diagnosis not present

## 2020-07-18 DIAGNOSIS — M722 Plantar fascial fibromatosis: Secondary | ICD-10-CM | POA: Diagnosis not present

## 2020-07-18 DIAGNOSIS — M79671 Pain in right foot: Secondary | ICD-10-CM | POA: Diagnosis not present

## 2020-07-18 DIAGNOSIS — I739 Peripheral vascular disease, unspecified: Secondary | ICD-10-CM | POA: Diagnosis not present

## 2020-07-18 DIAGNOSIS — L600 Ingrowing nail: Secondary | ICD-10-CM | POA: Diagnosis not present

## 2020-07-18 DIAGNOSIS — B351 Tinea unguium: Secondary | ICD-10-CM | POA: Diagnosis not present

## 2020-07-18 DIAGNOSIS — E1140 Type 2 diabetes mellitus with diabetic neuropathy, unspecified: Secondary | ICD-10-CM | POA: Diagnosis not present

## 2020-08-08 ENCOUNTER — Other Ambulatory Visit: Payer: Self-pay

## 2020-08-08 MED ORDER — GABAPENTIN 100 MG PO CAPS: 100.0000 mg | ORAL_CAPSULE | Freq: Two times a day (BID) | ORAL | 5 refills | Status: DC

## 2020-08-12 ENCOUNTER — Telehealth: Payer: Self-pay | Admitting: Neurology

## 2020-08-12 NOTE — Telephone Encounter (Signed)
Patient called and said she'd like a call back about her gabapentin 100 MG.   She needs refills, she said, but is not sure what it is for. Please confirm patient's pharmacy upon call back.

## 2020-08-13 ENCOUNTER — Other Ambulatory Visit: Payer: Self-pay

## 2020-08-13 MED ORDER — GABAPENTIN 100 MG PO CAPS: 100.0000 mg | ORAL_CAPSULE | Freq: Two times a day (BID) | ORAL | 3 refills | Status: DC

## 2020-08-13 NOTE — Telephone Encounter (Signed)
Refill sent in for pt. 

## 2020-08-27 ENCOUNTER — Other Ambulatory Visit (HOSPITAL_COMMUNITY): Payer: Self-pay | Admitting: Orthopedic Surgery

## 2020-08-27 DIAGNOSIS — Z96612 Presence of left artificial shoulder joint: Secondary | ICD-10-CM

## 2020-08-27 DIAGNOSIS — M25512 Pain in left shoulder: Secondary | ICD-10-CM | POA: Diagnosis not present

## 2020-08-27 DIAGNOSIS — Z96611 Presence of right artificial shoulder joint: Secondary | ICD-10-CM

## 2020-08-27 DIAGNOSIS — Z96619 Presence of unspecified artificial shoulder joint: Secondary | ICD-10-CM

## 2020-09-03 DIAGNOSIS — I25118 Atherosclerotic heart disease of native coronary artery with other forms of angina pectoris: Secondary | ICD-10-CM | POA: Diagnosis not present

## 2020-09-03 DIAGNOSIS — I739 Peripheral vascular disease, unspecified: Secondary | ICD-10-CM | POA: Diagnosis not present

## 2020-09-04 DIAGNOSIS — M62838 Other muscle spasm: Secondary | ICD-10-CM | POA: Diagnosis not present

## 2020-09-04 DIAGNOSIS — Z79899 Other long term (current) drug therapy: Secondary | ICD-10-CM | POA: Diagnosis not present

## 2020-09-04 DIAGNOSIS — I10 Essential (primary) hypertension: Secondary | ICD-10-CM | POA: Diagnosis not present

## 2020-09-04 DIAGNOSIS — I517 Cardiomegaly: Secondary | ICD-10-CM | POA: Diagnosis not present

## 2020-09-04 DIAGNOSIS — R079 Chest pain, unspecified: Secondary | ICD-10-CM | POA: Diagnosis not present

## 2020-09-04 DIAGNOSIS — N281 Cyst of kidney, acquired: Secondary | ICD-10-CM | POA: Diagnosis not present

## 2020-09-04 DIAGNOSIS — E039 Hypothyroidism, unspecified: Secondary | ICD-10-CM | POA: Diagnosis not present

## 2020-09-04 DIAGNOSIS — J9811 Atelectasis: Secondary | ICD-10-CM | POA: Diagnosis not present

## 2020-09-04 DIAGNOSIS — M5412 Radiculopathy, cervical region: Secondary | ICD-10-CM | POA: Diagnosis not present

## 2020-09-04 DIAGNOSIS — Z888 Allergy status to other drugs, medicaments and biological substances status: Secondary | ICD-10-CM | POA: Diagnosis not present

## 2020-09-04 DIAGNOSIS — R9431 Abnormal electrocardiogram [ECG] [EKG]: Secondary | ICD-10-CM | POA: Diagnosis not present

## 2020-09-04 DIAGNOSIS — R519 Headache, unspecified: Secondary | ICD-10-CM | POA: Diagnosis not present

## 2020-09-04 DIAGNOSIS — K760 Fatty (change of) liver, not elsewhere classified: Secondary | ICD-10-CM | POA: Diagnosis not present

## 2020-09-04 DIAGNOSIS — E119 Type 2 diabetes mellitus without complications: Secondary | ICD-10-CM | POA: Diagnosis not present

## 2020-09-04 DIAGNOSIS — E785 Hyperlipidemia, unspecified: Secondary | ICD-10-CM | POA: Diagnosis not present

## 2020-09-04 DIAGNOSIS — K449 Diaphragmatic hernia without obstruction or gangrene: Secondary | ICD-10-CM | POA: Diagnosis not present

## 2020-09-04 DIAGNOSIS — I2510 Atherosclerotic heart disease of native coronary artery without angina pectoris: Secondary | ICD-10-CM | POA: Diagnosis not present

## 2020-09-04 DIAGNOSIS — M5410 Radiculopathy, site unspecified: Secondary | ICD-10-CM | POA: Diagnosis not present

## 2020-09-04 DIAGNOSIS — I4891 Unspecified atrial fibrillation: Secondary | ICD-10-CM | POA: Diagnosis not present

## 2020-09-10 ENCOUNTER — Ambulatory Visit (HOSPITAL_COMMUNITY)
Admission: RE | Admit: 2020-09-10 | Discharge: 2020-09-10 | Disposition: A | Discharge status: Home / Self Care | Payer: Medicare HMO | Source: Ambulatory Visit | Attending: Orthopedic Surgery | Admitting: Orthopedic Surgery

## 2020-09-10 ENCOUNTER — Ambulatory Visit (HOSPITAL_COMMUNITY): Admission: RE | Admit: 2020-09-10 | Payer: Medicare HMO | Source: Ambulatory Visit

## 2020-09-10 ENCOUNTER — Other Ambulatory Visit: Payer: Self-pay

## 2020-09-10 ENCOUNTER — Encounter (HOSPITAL_COMMUNITY): Payer: Self-pay

## 2020-09-10 DIAGNOSIS — M25512 Pain in left shoulder: Secondary | ICD-10-CM | POA: Diagnosis not present

## 2020-09-10 DIAGNOSIS — Z96611 Presence of right artificial shoulder joint: Secondary | ICD-10-CM

## 2020-09-10 DIAGNOSIS — Z96612 Presence of left artificial shoulder joint: Secondary | ICD-10-CM | POA: Insufficient documentation

## 2020-09-10 DIAGNOSIS — M25511 Pain in right shoulder: Secondary | ICD-10-CM | POA: Diagnosis not present

## 2020-09-10 DIAGNOSIS — Z96619 Presence of unspecified artificial shoulder joint: Secondary | ICD-10-CM | POA: Insufficient documentation

## 2020-09-15 ENCOUNTER — Ambulatory Visit
Admission: RE | Admit: 2020-09-15 | Discharge: 2020-09-15 | Disposition: A | Discharge status: Home / Self Care | Payer: Medicare HMO | Source: Ambulatory Visit | Attending: Radiation Oncology | Admitting: Radiation Oncology

## 2020-09-16 DIAGNOSIS — R051 Acute cough: Secondary | ICD-10-CM | POA: Diagnosis not present

## 2020-09-16 DIAGNOSIS — U071 COVID-19: Secondary | ICD-10-CM | POA: Diagnosis not present

## 2020-09-22 DIAGNOSIS — R1084 Generalized abdominal pain: Secondary | ICD-10-CM | POA: Diagnosis not present

## 2020-09-22 DIAGNOSIS — K449 Diaphragmatic hernia without obstruction or gangrene: Secondary | ICD-10-CM | POA: Diagnosis not present

## 2020-09-22 DIAGNOSIS — N839 Noninflammatory disorder of ovary, fallopian tube and broad ligament, unspecified: Secondary | ICD-10-CM | POA: Diagnosis not present

## 2020-09-22 DIAGNOSIS — E119 Type 2 diabetes mellitus without complications: Secondary | ICD-10-CM | POA: Diagnosis not present

## 2020-09-22 DIAGNOSIS — E278 Other specified disorders of adrenal gland: Secondary | ICD-10-CM | POA: Diagnosis not present

## 2020-09-22 DIAGNOSIS — I10 Essential (primary) hypertension: Secondary | ICD-10-CM | POA: Diagnosis not present

## 2020-09-22 DIAGNOSIS — N390 Urinary tract infection, site not specified: Secondary | ICD-10-CM | POA: Diagnosis not present

## 2020-09-22 DIAGNOSIS — N281 Cyst of kidney, acquired: Secondary | ICD-10-CM | POA: Diagnosis not present

## 2020-09-22 DIAGNOSIS — I2510 Atherosclerotic heart disease of native coronary artery without angina pectoris: Secondary | ICD-10-CM | POA: Diagnosis not present

## 2020-09-22 DIAGNOSIS — R9431 Abnormal electrocardiogram [ECG] [EKG]: Secondary | ICD-10-CM | POA: Diagnosis not present

## 2020-09-22 DIAGNOSIS — E785 Hyperlipidemia, unspecified: Secondary | ICD-10-CM | POA: Diagnosis not present

## 2020-09-22 DIAGNOSIS — R9389 Abnormal findings on diagnostic imaging of other specified body structures: Secondary | ICD-10-CM | POA: Diagnosis not present

## 2020-09-22 DIAGNOSIS — I4891 Unspecified atrial fibrillation: Secondary | ICD-10-CM | POA: Diagnosis not present

## 2020-09-22 DIAGNOSIS — Z7901 Long term (current) use of anticoagulants: Secondary | ICD-10-CM | POA: Diagnosis not present

## 2020-09-22 DIAGNOSIS — R109 Unspecified abdominal pain: Secondary | ICD-10-CM | POA: Diagnosis not present

## 2020-09-23 DIAGNOSIS — N390 Urinary tract infection, site not specified: Secondary | ICD-10-CM | POA: Diagnosis not present

## 2020-09-23 DIAGNOSIS — K5900 Constipation, unspecified: Secondary | ICD-10-CM | POA: Diagnosis not present

## 2020-09-23 DIAGNOSIS — C779 Secondary and unspecified malignant neoplasm of lymph node, unspecified: Secondary | ICD-10-CM | POA: Diagnosis not present

## 2020-09-23 DIAGNOSIS — E1169 Type 2 diabetes mellitus with other specified complication: Secondary | ICD-10-CM | POA: Diagnosis not present

## 2020-09-23 DIAGNOSIS — E559 Vitamin D deficiency, unspecified: Secondary | ICD-10-CM | POA: Diagnosis not present

## 2020-09-23 DIAGNOSIS — I739 Peripheral vascular disease, unspecified: Secondary | ICD-10-CM | POA: Diagnosis not present

## 2020-09-23 DIAGNOSIS — J45991 Cough variant asthma: Secondary | ICD-10-CM | POA: Diagnosis not present

## 2020-09-23 DIAGNOSIS — I2510 Atherosclerotic heart disease of native coronary artery without angina pectoris: Secondary | ICD-10-CM | POA: Diagnosis not present

## 2020-09-23 DIAGNOSIS — K219 Gastro-esophageal reflux disease without esophagitis: Secondary | ICD-10-CM | POA: Diagnosis not present

## 2020-09-23 DIAGNOSIS — E7800 Pure hypercholesterolemia, unspecified: Secondary | ICD-10-CM | POA: Diagnosis not present

## 2020-09-23 DIAGNOSIS — C801 Malignant (primary) neoplasm, unspecified: Secondary | ICD-10-CM | POA: Diagnosis not present

## 2020-09-23 NOTE — Progress Notes (Signed)
Radiation Oncology         [phone] ________________________________  Name: Lorraine Gilbert MRN: [id number]  Date: 09/24/2020  DOB: [birth year]  Follow-Up Visit Note  CC: [name], MD  [name], MD    ICD-10-CM   1. Metastatic squamous cell carcinoma involving lymph node with unknown primary site [facility])  C77.9    C80.1       Diagnosis:  Metastatic squamous cell carcinoma presenting in the left inguinal area (unknown primary site)  Interval Since Last Radiation:  2 years, 2 months, and 8 days   Radiation Treatment Dates: 07/05/2018 - 07/18/2018:   Site/Dose: Left Inguinal / 30 Gy in 10 fractions   Narrative:  The patient returns today for routine follow-up, she was last seen by me on 06/05/20. Since her last visit, the patient received the following imaging:  -- CT of the head on 07/07/20 revealing no acute intracranial abnormalities -- Chest x-ray taken on 07/07/20 due to cough revealed no acute findings -- CT of the abdomen and pelvis on 09/22/20 demonstrating multicystic enhancing right adnexal lesion with mild surrounding stranding; noted as suspicious for torsion or infection and unlikely indicative of neoplasm. -- Transabdominal/Transvaginal pelvic US performed on 09/22/20 demonstrating an abnormal complex hypoechoic focus about the right ovary; findings were noted to not exclude neoplasm. -- Full body bone scan (3-phase) on 09/10/20 revealing degenerative type uptake of tracer at left shoulder, with focal increase in uptake at the left head/greater tuberosity region.     Of note: patient presented to the [facility] ED recently on 09/04/20 with pain in between her shoulder blades with numbness and tingling radiating down to bilateral arms and abdomen. Upon ED evaluation, patient stated that her pain vanished, however  the numbness and tingling persisted (then also vanished soon after in the ED). Chest x-ray, head CT, and CT of the chest abdomen and pelvis performed during ED  course demonstrated no acute abnormalities/ findings. Labs taken during ED triage did however reveal the patient to have elevated D-Dimer, although she presented with no other symptoms concerning for cardiac distress. Patient was instructed to maintain close follow-up with her outpatient providers and was discharged.                       Allergies:  is allergic to ibuprofen.  Meds: Current Outpatient Medications  Medication Sig Dispense Refill   ACCU-CHEK AVIVA PLUS test strip 1 each as directed.     Accu-Chek Softclix Lancets lancets 1 each by Other route as directed.     ACETAMINOPHEN 8 HOUR PO Take 1 tablet by mouth as needed.     albuterol (VENTOLIN HFA) 108 (90 Base) MCG/ACT inhaler      Alcohol Swabs (ALCOHOL WIPES) 70 % PADS DropSafe Alcohol Prep Pads     amiodarone (PACERONE) 200 MG tablet Take 1 tablet by mouth daily.     apixaban (ELIQUIS) 2.5 MG TABS tablet Take 2.5 mg by mouth 2 (two) times daily.      Blood Glucose Calibration (ACCU-CHEK AVIVA) SOLN      cephALEXin (KEFLEX) 500 MG capsule Take by mouth.     escitalopram (LEXAPRO) 10 MG tablet Take 10 mg by mouth daily.     folic acid (FOLVITE) 1 MG tablet Take 1 mg by mouth every morning.      gabapentin (NEURONTIN) 100 MG capsule Take 1 capsule (100 mg total) by mouth 2 (two) times daily. 60 capsule 3   METHOTREXATE PO methotrexate (PF)  montelukast (SINGULAIR) 10 MG tablet      Multiple Vitamin (MULTIVITAMIN WITH MINERALS) TABS tablet Take 1 tablet by mouth daily.     nitroGLYCERIN (NITROSTAT) 0.4 MG SL tablet Place 0.4 mg under the tongue every 5 (five) minutes as needed for chest pain.     rosuvastatin (CRESTOR) 5 MG tablet Take by mouth.     Vitamin D, Ergocalciferol, (DRISDOL) 1.25 MG (50000 UT) CAPS capsule Take 1 capsule by mouth every Wednesday.     clopidogrel (PLAVIX) 75 MG tablet Take 75 mg by mouth daily. (Patient not taking: Reported on 09/24/2020)     famotidine (PEPCID) 20 MG tablet One after supper (Patient  not taking: Reported on 09/24/2020) 30 tablet 11   pantoprazole (PROTONIX) 40 MG tablet Take 30-60 min before first meal of the day (Patient not taking: Reported on 09/24/2020)     ticagrelor (BRILINTA) 90 MG TABS tablet Take 90 mg by mouth 2 (two) times daily. (Patient not taking: Reported on 09/24/2020)     No current facility-administered medications for this encounter.    Physical Findings: The patient is in no acute distress. Patient is alert and oriented.  height is '5\' 3"'$  (1.6 m) and weight is 138 lb (62.6 kg). Her temperature is 98.4 F (36.9 C). Her blood pressure is 184/51 (abnormal) and her pulse is 55 (abnormal). Her respiration is 20 and oxygen saturation is 100%. .  No significant changes. Lungs are clear to auscultation bilaterally. Heart has regular rate and rhythm. No palpable cervical, supraclavicular, or axillary adenopathy. Abdomen soft, non-tender, normal bowel sounds.  Examination of the inguinal areas reveals no palpable lymphadenopathy.  On pelvic examination the external genitalia unremarkable.  A speculum exam was performed.  No mucosal lesions noted in the vaginal vault.  On bimanual and rectovaginal examination the patient does have thickened area in the distal rectum along the patient's left side.   Lab Findings: Lab Results  Component Value Date   WBC 7.7 08/07/2019   HGB 13.4 08/07/2019   HCT 40.3 08/07/2019   MCV 89.9 08/07/2019   PLT 301.0 08/07/2019    Radiographic Findings: NM Bone Scan 3 Phase  Result Date: 09/11/2020 CLINICAL DATA:  RIGHT shoulder pain since shoulder replacement surgery in 2020, LEFT shoulder pain for 2 months without injury, history of metastatic squamous cell carcinoma EXAM: NUCLEAR MEDICINE 3-PHASE BONE SCAN TECHNIQUE: Radionuclide angiographic images, immediate static blood pool images, and 3-hour delayed static images were obtained of the shoulders/chest after intravenous injection of radiopharmaceutical. RADIOPHARMACEUTICALS:  21.3 mCi  Tc-63mMDP IV COMPARISON:  02/14/2018 Radiographic correlation: None recent FINDINGS: Vascular phase: Normal blood flow to both shoulders Blood pool phase: Minimally increased blood pool in the periarticular regions of the RIGHT shoulder. Mildly increased blood pool diffusely in LEFT shoulder region. Delayed phase: Uptake at LEFT acromioclavicular and glenohumeral joints consistent with degenerative changes. Additional increased focal uptake is seen at the LEFT humeral head/greater tuberosity region, uncertain etiology. Photopenic defect at RIGHT shoulder from prosthesis. Mild uptake adjacent to the proximal and distal aspects of the humeral component of the RIGHT shoulder prosthesis, a pattern which may be seen with loosening. Additional uptake of tracer at RIGHT [facility]. No abnormal tracer uptake within the thoracic spine, sternum, or ribs. IMPRESSION: Degenerative type uptake of tracer at LEFT shoulder with focal increased uptake at LEFT humeral head/greater tuberosity region, recommend dedicated radiographs to evaluate. RIGHT shoulder prosthesis with uptake adjacent to the proximal and distal aspects of the humeral component, a pattern  suggestive of aseptic loosening. Electronically Signed   By: Lavonia Dana M.D.   On: 09/11/2020 08:47    Impression:  Metastatic squamous cell carcinoma presenting in the left inguinal area (unknown primary site)  Patient likely has an infection of the ovary area.  She is on antibiotics for this issue.  She will need a follow-up CT scan for evaluation of this issue.  In addition on my exam today the patient has a firm area in the distal rectum area.  She will need to see either GI or general surgery for further evaluation of this issue.  In addition the patient's blood pressures been elevated and recommend she follow-up with her primary care physician concerning this issue.  Plan: Patient will follow-up in 3 months.  If she has not had a repeat CT scan of the abdomen and  pelvis I will order at that time.   25 minutes of total time was spent for this patient encounter, including preparation, face-to-face counseling with the patient and coordination of care, physical exam, and documentation of the encounter. ____________________________________  Blair Promise, PhD, MD   This document serves as a record of services personally performed by Gery Pray, MD. It was created on his behalf by Roney Mans, a trained medical scribe. The creation of this record is based on the scribe's personal observations and the provider's statements to them. This document has been checked and approved by the attending provider.

## 2020-09-24 ENCOUNTER — Other Ambulatory Visit: Payer: Self-pay

## 2020-09-24 ENCOUNTER — Ambulatory Visit
Admission: RE | Admit: 2020-09-24 | Discharge: 2020-09-24 | Disposition: A | Discharge status: Home / Self Care | Payer: Medicare HMO | Source: Ambulatory Visit | Attending: Radiation Oncology | Admitting: Radiation Oncology

## 2020-09-24 ENCOUNTER — Encounter: Payer: Self-pay | Admitting: Radiation Oncology

## 2020-09-24 VITALS — BP 184/51 | HR 55 | Temp 98.4°F | Resp 20 | Ht 63.0 in | Wt 138.0 lb

## 2020-09-24 DIAGNOSIS — R7989 Other specified abnormal findings of blood chemistry: Secondary | ICD-10-CM | POA: Diagnosis not present

## 2020-09-24 DIAGNOSIS — R200 Anesthesia of skin: Secondary | ICD-10-CM | POA: Insufficient documentation

## 2020-09-24 DIAGNOSIS — Z08 Encounter for follow-up examination after completed treatment for malignant neoplasm: Secondary | ICD-10-CM | POA: Diagnosis not present

## 2020-09-24 DIAGNOSIS — Z923 Personal history of irradiation: Secondary | ICD-10-CM | POA: Diagnosis not present

## 2020-09-24 DIAGNOSIS — C801 Malignant (primary) neoplasm, unspecified: Secondary | ICD-10-CM

## 2020-09-24 DIAGNOSIS — Z79899 Other long term (current) drug therapy: Secondary | ICD-10-CM | POA: Diagnosis not present

## 2020-09-24 DIAGNOSIS — R202 Paresthesia of skin: Secondary | ICD-10-CM | POA: Diagnosis not present

## 2020-09-24 DIAGNOSIS — C775 Secondary and unspecified malignant neoplasm of intrapelvic lymph nodes: Secondary | ICD-10-CM | POA: Diagnosis not present

## 2020-09-24 DIAGNOSIS — Z7901 Long term (current) use of anticoagulants: Secondary | ICD-10-CM | POA: Diagnosis not present

## 2020-09-24 DIAGNOSIS — C779 Secondary and unspecified malignant neoplasm of lymph node, unspecified: Secondary | ICD-10-CM

## 2020-09-24 DIAGNOSIS — C774 Secondary and unspecified malignant neoplasm of inguinal and lower limb lymph nodes: Secondary | ICD-10-CM | POA: Diagnosis not present

## 2020-09-24 NOTE — Progress Notes (Signed)
She rates her pain as a 5 on a scale of 0-10 and located at right pelvic area and lower back.  Patient complains of Fatigue, Generalized Weakness, and Poor Appetite. Reports urinary frequency and urgency, nocturia x2.     Patient reports Constipation and Abdominal Pain.   Patient reports Pelvic pain.      BP (!) 180/55 (BP Location: Right Arm, Patient Position: Sitting, Cuff Size: Normal)   Pulse (!) 59   Temp 98.4 F (36.9 C)   Resp 20   Ht '5\' 3"'$  (1.6 m)   Wt 138 lb (62.6 kg)   SpO2 100%   BMI 24.45 kg/m

## 2020-09-25 ENCOUNTER — Telehealth: Payer: Self-pay | Admitting: Radiology

## 2020-09-25 DIAGNOSIS — R935 Abnormal findings on diagnostic imaging of other abdominal regions, including retroperitoneum: Secondary | ICD-10-CM | POA: Diagnosis not present

## 2020-09-25 DIAGNOSIS — R6889 Other general symptoms and signs: Secondary | ICD-10-CM | POA: Diagnosis not present

## 2020-09-25 DIAGNOSIS — K5900 Constipation, unspecified: Secondary | ICD-10-CM | POA: Diagnosis not present

## 2020-09-25 NOTE — Telephone Encounter (Signed)
Appointment made with PCP due to elevated blood pressures of 180s/50s. Patient aware. Advised patient to go to Urgent Care if she becomes dizzy or develops chest pain or headache. Patient verbalized understanding.

## 2020-09-29 DIAGNOSIS — R059 Cough, unspecified: Secondary | ICD-10-CM | POA: Diagnosis not present

## 2020-09-29 DIAGNOSIS — I4891 Unspecified atrial fibrillation: Secondary | ICD-10-CM | POA: Diagnosis not present

## 2020-09-29 DIAGNOSIS — E1169 Type 2 diabetes mellitus with other specified complication: Secondary | ICD-10-CM | POA: Diagnosis not present

## 2020-09-29 DIAGNOSIS — F419 Anxiety disorder, unspecified: Secondary | ICD-10-CM | POA: Diagnosis not present

## 2020-10-01 DIAGNOSIS — M25511 Pain in right shoulder: Secondary | ICD-10-CM | POA: Diagnosis not present

## 2020-10-01 DIAGNOSIS — M25512 Pain in left shoulder: Secondary | ICD-10-CM | POA: Diagnosis not present

## 2020-10-16 DIAGNOSIS — R053 Chronic cough: Secondary | ICD-10-CM | POA: Diagnosis not present

## 2020-10-16 DIAGNOSIS — D170 Benign lipomatous neoplasm of skin and subcutaneous tissue of head, face and neck: Secondary | ICD-10-CM | POA: Diagnosis not present

## 2020-10-22 DIAGNOSIS — I739 Peripheral vascular disease, unspecified: Secondary | ICD-10-CM | POA: Diagnosis not present

## 2020-10-22 DIAGNOSIS — I25118 Atherosclerotic heart disease of native coronary artery with other forms of angina pectoris: Secondary | ICD-10-CM | POA: Diagnosis not present

## 2020-11-05 DIAGNOSIS — N838 Other noninflammatory disorders of ovary, fallopian tube and broad ligament: Secondary | ICD-10-CM | POA: Diagnosis not present

## 2020-11-06 ENCOUNTER — Other Ambulatory Visit: Payer: Self-pay | Admitting: Family Medicine

## 2020-11-06 DIAGNOSIS — Z1231 Encounter for screening mammogram for malignant neoplasm of breast: Secondary | ICD-10-CM

## 2020-11-07 DIAGNOSIS — K029 Dental caries, unspecified: Secondary | ICD-10-CM | POA: Diagnosis not present

## 2020-11-07 DIAGNOSIS — Z8679 Personal history of other diseases of the circulatory system: Secondary | ICD-10-CM | POA: Diagnosis not present

## 2020-11-07 DIAGNOSIS — K047 Periapical abscess without sinus: Secondary | ICD-10-CM | POA: Diagnosis not present

## 2020-11-07 DIAGNOSIS — R519 Headache, unspecified: Secondary | ICD-10-CM | POA: Diagnosis not present

## 2020-11-07 DIAGNOSIS — F419 Anxiety disorder, unspecified: Secondary | ICD-10-CM | POA: Diagnosis not present

## 2020-11-07 DIAGNOSIS — I5032 Chronic diastolic (congestive) heart failure: Secondary | ICD-10-CM | POA: Diagnosis not present

## 2020-11-07 DIAGNOSIS — F32A Depression, unspecified: Secondary | ICD-10-CM | POA: Diagnosis not present

## 2020-11-07 DIAGNOSIS — I2510 Atherosclerotic heart disease of native coronary artery without angina pectoris: Secondary | ICD-10-CM | POA: Diagnosis not present

## 2020-11-07 DIAGNOSIS — I110 Hypertensive heart disease with heart failure: Secondary | ICD-10-CM | POA: Diagnosis not present

## 2020-11-10 DIAGNOSIS — M2550 Pain in unspecified joint: Secondary | ICD-10-CM | POA: Diagnosis not present

## 2020-12-04 ENCOUNTER — Ambulatory Visit
Admission: RE | Admit: 2020-12-04 | Discharge: 2020-12-04 | Disposition: A | Discharge status: Home / Self Care | Payer: Medicare Other | Source: Ambulatory Visit | Attending: Family Medicine | Admitting: Family Medicine

## 2020-12-04 DIAGNOSIS — Z1231 Encounter for screening mammogram for malignant neoplasm of breast: Secondary | ICD-10-CM

## 2020-12-05 ENCOUNTER — Encounter: Payer: Self-pay | Admitting: Neurology

## 2020-12-05 ENCOUNTER — Other Ambulatory Visit: Payer: Self-pay

## 2020-12-05 ENCOUNTER — Ambulatory Visit: Payer: Medicare Other | Admitting: Neurology

## 2020-12-05 VITALS — BP 148/77 | HR 73 | Ht 63.0 in | Wt 138.4 lb

## 2020-12-05 DIAGNOSIS — R413 Other amnesia: Secondary | ICD-10-CM

## 2020-12-05 DIAGNOSIS — G629 Polyneuropathy, unspecified: Secondary | ICD-10-CM

## 2020-12-05 DIAGNOSIS — R296 Repeated falls: Secondary | ICD-10-CM

## 2020-12-05 NOTE — Progress Notes (Signed)
NEUROLOGY FOLLOW UP OFFICE NOTE  Lorraine Gilbert [id number] 01/13/1933  HISTORY OF PRESENT ILLNESS: I had the pleasure of seeing Lorraine Gilbert in follow-up in the neurology clinic on 12/05/2020.  The patient was last seen 6 months ago for memory loss, vertigo, and neuropathy. She is alone in the office today. Since her last visit, she reports memory is fine, "sometimes forgets things." She manages her own medications and bills and denies any issues with this. She does not drive. She has occasional headaches and gets dizzy when she has a runny nose. Sleep is okay. She was previously on gabapentin for neuropathic pain, she states "they took me off it, not sure why." However, she states there is no more burning in her legs, occasionally she feels it in the soles of her feet. She reports her knees are so painful and weak, she had an injection recently. Last fall was the other day. She uses her cane more than her walker, her husband usually holds on to her.     History on Initial Assessment 08/20/2014: This is a pleasant 87 yo RH woman with a history of atrial fibrillation on anticoagulation with Eliquis, hypertension, hyperlipidemia, diabetes, anxiety, vitamin D deficiency, polymyalgia rheumatica, who presented for evaluation of worsening memory. She started noticing symptoms over the past few months, mostly with short-term memory, she would forget things, occasionally forget her medications, or get disoriented when driving in unfamiliar places. She has noticed some word-finding difficulties. There are times she would "feel lost" when in crowded places like the mall last week. She denies any missed bill payments, she cooks and drives without difficulties, no difficulties with ADLs.   She was found to have atrial fibrillation after episodes of syncope 2-3 years ago. She has had brief sharp pains in the occipital region lasting a few seconds occurring around once a week, with associated photophobia, no  nausea/vomiting. She has occasional dizziness. She denies any vision changes except for blurred vision from cataracts and glaucoma. She has chronic neck and back pain. She has numbness and tingling in both hands, and occasional pain in her calves. She denies any bowel/bladder dysfunction, no anosmia or tremors. She reports her mother had "the beginning of Alzheimer's" at age 85. She had a head injury when younger, hit her head and was unconscious until she woke up in the hospital. She denies any alcohol intake.  Records and images were personally reviewed where available.  I personally reviewed MRI brain without contrast which did not show any acute changes. There was mild diffuse atrophy and mild chronic microvascular disease. TSH and B12 normal.  EMG/NCV of both legs done 08/2019 showed sensory polyneuropathy in both lower extremities, moderate in degree, new compared to prior study in 2019.    PAST MEDICAL HISTORY: Past Medical History:  Diagnosis Date   A-fib ([place])    Abnormal heart rhythm    Allergic rhinitis    Anxiety    Asthma    Diabetes mellitus without complication (HCC)    Type II   GERD (gastroesophageal reflux disease)    Headache    History of radiation therapy 07/05/2018-07/18/2018   left pelvis        Dr Gery Pray   Hypertension    Neuromuscular disorder ([place])    Siactic pain in right leg   Osteopenia    Rheumatoid arthritis ([place])    Rotator cuff tear arthropathy    Right   Vertigo     MEDICATIONS: Current Outpatient Medications on File  Prior to Visit  Medication Sig Dispense Refill   ACCU-CHEK AVIVA PLUS test strip 1 each as directed.     Accu-Chek Softclix Lancets lancets 1 each by Other route as directed.     ACETAMINOPHEN 8 HOUR PO Take 1 tablet by mouth as needed.     albuterol (VENTOLIN HFA) 108 (90 Base) MCG/ACT inhaler      Alcohol Swabs (ALCOHOL WIPES) 70 % PADS DropSafe Alcohol Prep Pads     amiodarone (PACERONE) 200 MG tablet Take 1 tablet by mouth  daily.     apixaban (ELIQUIS) 2.5 MG TABS tablet Take 2.5 mg by mouth 2 (two) times daily.      Blood Glucose Calibration (ACCU-CHEK AVIVA) SOLN      escitalopram (LEXAPRO) 10 MG tablet Take 10 mg by mouth daily.     folic acid (FOLVITE) 1 MG tablet Take 1 mg by mouth every morning.      METHOTREXATE PO methotrexate (PF)     Multiple Vitamin (MULTIVITAMIN WITH MINERALS) TABS tablet Take 1 tablet by mouth daily.     nitroGLYCERIN (NITROSTAT) 0.4 MG SL tablet Place 0.4 mg under the tongue every 5 (five) minutes as needed for chest pain.     rosuvastatin (CRESTOR) 5 MG tablet Take by mouth.     Vitamin D, Ergocalciferol, (DRISDOL) 1.25 MG (50000 UT) CAPS capsule Take 1 capsule by mouth every Wednesday.     No current facility-administered medications on file prior to visit.    ALLERGIES: Allergies  Allergen Reactions   Ibuprofen Other (See Comments)    Confusion, Patient states that the 800mg  Motrin made her feel like she was flying.    FAMILY HISTORY: Family History  Problem Relation Age of Onset   Emphysema Father        smoked   Heart disease Mother    Heart attack Son    Breast cancer Neg Hx     SOCIAL HISTORY: Social History   Socioeconomic History   Marital status: Married    Spouse name: Pascual   Number of children: 6   Years of education: Not on file   Highest education level: Not on file  Occupational History   Occupation: Retired Event organiser  Tobacco Use   Smoking status: Never   Smokeless tobacco: Never  Vaping Use   Vaping Use: Never used  Substance and Sexual Activity   Alcohol use: No    Alcohol/week: 0.0 standard drinks   Drug use: No   Sexual activity: Not on file  Other Topics Concern   Not on file  Social History Narrative   Right Handed   Lives with husband and son   One Story Home   Caffeine, Yes drinks coffee    Social Determinants of Health   Financial Resource Strain: Not on file  Food Insecurity: Not on file  Transportation  Needs: Not on file  Physical Activity: Not on file  Stress: Not on file  Social Connections: Not on file  Intimate Partner Violence: Not on file     PHYSICAL EXAM: Vitals:   12/05/20 1111  BP: (!) 148/77  Pulse: 73  SpO2: 98%   General: No acute distress Head:  Normocephalic/atraumatic Skin/Extremities: No rash, no edema Neurological Exam: alert and oriented to person, place, and time. No aphasia or dysarthria. Fund of knowledge is appropriate.  Recent and remote memory are intact.  Attention and concentration are normal.  MMSE 28/30  MMSE - Mini Mental State Exam 12/05/2020 04/06/2019 06/28/2017  Orientation to time 4 5 5   Orientation to Place 5 5 5   Registration 3 3 3   Attention/ Calculation 5 3 5   Recall 2 2 3   Language- name 2 objects 2 2 2   Language- repeat 1 1 1   Language- follow 3 step command 3 3 2   Language- read & follow direction 1 1 1   Write a sentence 1 1 1   Copy design 1 1 1   Total score 28 27 29    Cranial nerves: Pupils equal, round. Extraocular movements intact with no nystagmus. Visual fields full.  No facial asymmetry.  Motor: Bulk and tone normal, muscle strength 5/5 throughout with no pronator drift.   Finger to nose testing intact.  Gait slow and cautious, no ataxia   IMPRESSION: This is a pleasant 87 yo RH woman with vascular risk factors including hypertension, hyperlipidemia, diabetes, atrial fibrillation on Eliquis, PMR, mild dementia, neuropathy. EMG/NCV confirmed moderate sensory neuropathy in both legs. Her memory has been overall stable, MMSE today normal 28/30. She is not on any medications for memory. She denies any significant dizziness, mostly when she has a runny nose. She continues to report falls, likely multifactorial from neuropathy, arthritis, peripheral vascular disease. She was advised to use her walker more often for stability. She has stopped gabapentin with no recurrence of significant neuropathic pain. We discussed the importance of  control of vascular risk factors, physical exercise, brain stimulation exercises for overall brain health. Follow-up as needed, she knows to call for any changes.   Thank you for allowing me to participate in her care.  Please do not hesitate to call for any questions or concerns.    Ellouise Newer, M.D.   CC: Dr. Moreen Fowler

## 2020-12-05 NOTE — Patient Instructions (Signed)
Good to see you!  Continue exercises. Please use walker more frequently to help with falls. Follow-up as needed, call for any changes  FALL PRECAUTIONS: Be cautious when walking. Scan the area for obstacles that may increase the risk of trips and falls. When getting up in the mornings, sit up at the edge of the bed for a few minutes before getting out of bed. Consider elevating the bed at the head end to avoid drop of blood pressure when getting up. Walk always in a well-lit room (use night lights in the walls). Avoid area rugs or power cords from appliances in the middle of the walkways. Use a walker or a cane if necessary and consider physical therapy for balance exercise. Get your eyesight checked regularly.  FINANCIAL OVERSIGHT: Supervision, especially oversight when making financial decisions or transactions is also recommended as difficulties arise.  HOME SAFETY: Consider the safety of the kitchen when operating appliances like stoves, microwave oven, and blender. Consider having supervision and share cooking responsibilities until no longer able to participate in those. Accidents with firearms and other hazards in the house should be identified and addressed as well.  ABILITY TO BE LEFT ALONE: If patient is unable to contact 911 operator, consider using LifeLine, or when the need is there, arrange for someone to stay with patients. Smoking is a fire hazard, consider supervision or cessation. Risk of wandering should be assessed by caregiver and if detected at any point, supervision and safe proof recommendations should be instituted.  MEDICATION SUPERVISION: Inability to self-administer medication needs to be constantly addressed. Implement a mechanism to ensure safe administration of the medications.  RECOMMENDATIONS FOR ALL PATIENTS WITH MEMORY PROBLEMS: 1. Continue to exercise (Recommend 30 minutes of walking everyday, or 3 hours every week) 2. Increase social interactions - continue going  to [place] and enjoy social gatherings with friends and family 3. Eat healthy, avoid fried foods and eat more fruits and vegetables 4. Maintain adequate blood pressure, blood sugar, and blood cholesterol level. Reducing the risk of stroke and cardiovascular disease also helps promoting better memory. 5. Avoid stressful situations. Live a simple life and avoid aggravations. Organize your time and prepare for the next day in anticipation. 6. Sleep well, avoid any interruptions of sleep and avoid any distractions in the bedroom that may interfere with adequate sleep quality 7. Avoid sugar, avoid sweets as there is a strong link between excessive sugar intake, diabetes, and cognitive impairment The Mediterranean diet has been shown to help patients reduce the risk of progressive memory disorders and reduces cardiovascular risk. This includes eating fish, eat fruits and green leafy vegetables, nuts like almonds and hazelnuts, walnuts, and also use olive oil. Avoid fast foods and fried foods as much as possible. Avoid sweets and sugar as sugar use has been linked to worsening of memory function.

## 2020-12-14 DIAGNOSIS — Z79899 Other long term (current) drug therapy: Secondary | ICD-10-CM | POA: Diagnosis not present

## 2020-12-14 DIAGNOSIS — E785 Hyperlipidemia, unspecified: Secondary | ICD-10-CM | POA: Diagnosis not present

## 2020-12-14 DIAGNOSIS — Z20822 Contact with and (suspected) exposure to covid-19: Secondary | ICD-10-CM | POA: Diagnosis not present

## 2020-12-14 DIAGNOSIS — R051 Acute cough: Secondary | ICD-10-CM | POA: Diagnosis not present

## 2020-12-14 DIAGNOSIS — E119 Type 2 diabetes mellitus without complications: Secondary | ICD-10-CM | POA: Diagnosis not present

## 2020-12-14 DIAGNOSIS — Z7901 Long term (current) use of anticoagulants: Secondary | ICD-10-CM | POA: Diagnosis not present

## 2020-12-14 DIAGNOSIS — I4891 Unspecified atrial fibrillation: Secondary | ICD-10-CM | POA: Diagnosis not present

## 2020-12-14 DIAGNOSIS — I10 Essential (primary) hypertension: Secondary | ICD-10-CM | POA: Diagnosis not present

## 2020-12-17 DIAGNOSIS — J45909 Unspecified asthma, uncomplicated: Secondary | ICD-10-CM | POA: Diagnosis not present

## 2020-12-17 DIAGNOSIS — J988 Other specified respiratory disorders: Secondary | ICD-10-CM | POA: Diagnosis not present

## 2020-12-17 DIAGNOSIS — R052 Subacute cough: Secondary | ICD-10-CM | POA: Diagnosis not present

## 2020-12-24 DIAGNOSIS — J4541 Moderate persistent asthma with (acute) exacerbation: Secondary | ICD-10-CM | POA: Diagnosis not present

## 2020-12-24 DIAGNOSIS — R053 Chronic cough: Secondary | ICD-10-CM | POA: Diagnosis not present

## 2020-12-24 DIAGNOSIS — J300 Vasomotor rhinitis: Secondary | ICD-10-CM | POA: Diagnosis not present

## 2020-12-24 DIAGNOSIS — J45909 Unspecified asthma, uncomplicated: Secondary | ICD-10-CM | POA: Diagnosis not present

## 2020-12-24 NOTE — Progress Notes (Signed)
Radiation Oncology         ([phone]) [phone] ________________________________  Name: Lorraine Gilbert MRN: [id number]  Date: 12/25/2020  DOB: 17-Sep-1932  Follow-Up Visit Note  CC: [name], MD  [name], MD    ICD-10-CM   1. Metastatic squamous cell carcinoma involving lymph node with unknown primary site [facility])  C77.9    C80.1       Diagnosis: Metastatic squamous cell carcinoma presenting in the left inguinal area (unknown primary site)  Interval Since Last Radiation: 2 years, 5 months, and 9 days   Radiation Treatment Dates: 07/05/2018 - 07/18/2018:    Site/Dose: Left Inguinal / 30 Gy in 10 fractions   Narrative:  The patient returns today for routine follow-up, she was last seen here for follow-up on 09/24/20.      Since her last visit, the patient was referred to Dr. Benay Pillow, GI, on 09/25/20. Rectal exam performed during evaluation revealed a firm, mass like area above the anus. Mass was noted to likely reflect a hemorrhoid, though Dr. Noureddine recommended a flexible sigmoidoscopy in the future to examine the area further.   On 11/05/20, the patient followed up with her OBGYN in regards to an ovarian mass discovered on recent ultrasound. To review, transvaginal US performed on 09/22/20, prompted by abdominal pain, demonstrated an abnormal complex hypoechoic focus about the right ovary, measuring 2.4 x 2.7 x 2.8 cm.  During visit on 11/05/20, the patient denied bleeding or pain to her right adnexa. The patient was instructed to return in 6 weeks to check for cancer markers and repeat pelvic US             Imaging/ studies performed since the patient was last seen includes:  -- Bilateral screening mammogram on 12/04/20 which demonstrated no mammographic evidence of malignancy.    --Stress test on 10/22/20 revealed reversibility noted at the apex suggestive of mild ischemia, and a nonspecific ST abnormality.   Of note: the patient has had 2 ED encounters since she was last  seen. On 11/07/20, she presented to the ED for evaluation of tooth pain. Patient reported the pain to start suddenly the day before, and increased to the point where pain was radiating to the left side of her face and ear. Given that the patient was unable to see her dentist soon enough, she was prescribed a course of oral antibiotics in the mean time. The patient then presented to the [place] ED on 12/14/20 a 1 week history of worsening productive cough and runny nose, and chest pain when she coughs.  Covid and flu tests were negative, patient was informed that her symptoms may be secondary to a viral illness or bronchitis. She was given Tessalon Perle and albuterol to use as needed and instructed to follow up with her PCP.   Allergies:  is allergic to ibuprofen.  Meds: Current Outpatient Medications  Medication Sig Dispense Refill   ACCU-CHEK AVIVA PLUS test strip 1 each as directed.     Accu-Chek Softclix Lancets lancets 1 each by Other route as directed.     ACETAMINOPHEN 8 HOUR PO Take 1 tablet by mouth as needed.     albuterol (VENTOLIN HFA) 108 (90 Base) MCG/ACT inhaler      Alcohol Swabs (ALCOHOL WIPES) 70 % PADS DropSafe Alcohol Prep Pads     amiodarone (PACERONE) 200 MG tablet Take 1 tablet by mouth daily.     apixaban (ELIQUIS) 2.5 MG TABS tablet Take 2.5 mg by mouth 2 (two) times  daily.      Blood Glucose Calibration (ACCU-CHEK AVIVA) SOLN      folic acid (FOLVITE) 1 MG tablet Take 1 mg by mouth every morning.      METHOTREXATE PO methotrexate (PF)     Multiple Vitamin (MULTIVITAMIN WITH MINERALS) TABS tablet Take 1 tablet by mouth daily.     nitroGLYCERIN (NITROSTAT) 0.4 MG SL tablet Place 0.4 mg under the tongue every 5 (five) minutes as needed for chest pain.     rosuvastatin (CRESTOR) 5 MG tablet Take by mouth.     Vitamin D, Ergocalciferol, (DRISDOL) 1.25 MG (50000 UT) CAPS capsule Take 1 capsule by mouth every Wednesday.     escitalopram (LEXAPRO) 10 MG tablet Take 10 mg by  mouth daily. (Patient not taking: Reported on 12/25/2020)     No current facility-administered medications for this encounter.    Physical Findings: The patient is in no acute distress. Patient is alert and oriented.  The patient coughs frequently during the evaluation.  height is 5\' 3"  (1.6 m) and weight is 135 lb 6 oz (61.4 kg). Her temporal temperature is 97.4 F (36.3 C) (abnormal). Her blood pressure is 138/55 (abnormal) and her pulse is 68. Her respiration is 18 and oxygen saturation is 99%. .  Lungs are clear to auscultation bilaterally. Heart has regular rate and rhythm. No palpable cervical, supraclavicular, or axillary adenopathy. Abdomen soft, non-tender, normal bowel sounds.  Small scar noted in the left inguinal area.  No palpable adenopathy in either inguinal regions.  On pelvic examination the external genitalia are unremarkable.  A speculum exam is performed.  There are no mucosal lesions noted in the vaginal vault.  The cervical os is noted in the proximal vagina.  On bimanual examination there are no pelvic masses appreciated.  A rectal exam is performed.  Patient continues to have a firm mass along the left lateral rectal wall that extends from the upper anus approximately 3 cm into the rectal region.  Rectal sphincter tone normal.   Lab Findings: Lab Results  Component Value Date   WBC 7.7 08/07/2019   HGB 13.4 08/07/2019   HCT 40.3 08/07/2019   MCV 89.9 08/07/2019   PLT 301.0 08/07/2019    Radiographic Findings: MM 3D SCREEN BREAST BILATERAL  Result Date: 12/04/2020 CLINICAL DATA:  Screening. EXAM: DIGITAL SCREENING BILATERAL MAMMOGRAM WITH TOMOSYNTHESIS AND CAD TECHNIQUE: Bilateral screening digital craniocaudal and mediolateral oblique mammograms were obtained. Bilateral screening digital breast tomosynthesis was performed. The images were evaluated with computer-aided detection. COMPARISON:  Previous exam(s). ACR Breast Density Category b: There are scattered areas of  fibroglandular density. FINDINGS: There are no findings suspicious for malignancy. IMPRESSION: No mammographic evidence of malignancy. A result letter of this screening mammogram will be mailed directly to the patient. RECOMMENDATION: Screening mammogram in one year. (Code:SM-B-01Y) BI-RADS CATEGORY  1: Negative. Electronically Signed   By: Claudie Revering M.D.   On: 12/04/2020 17:14    Impression:  Metastatic squamous cell carcinoma presenting in the left inguinal area (unknown primary site)  As above the patient has had problems with excessive coughing.  She continues to have this issue.  She has been on antibiotics for approximately 1 week.  She has not noticed any significant improvement in her coughing and she saw her allergist earlier today who gave her an inhaler as well as prednisone.  In light of this upper respiratory situation the patient did miss her appointments.  She reports she will be seen by gastroenterology on  December 29 in [place] presumably for flexible sigmoidoscopy at that time.  She reports this will be with Dr. Evalyn Casco.  She also reports she will be seeing her gynecologist January 13.  Patient understands she will be getting a CT scan in January of the abdomen and pelvis.  Plan: The patient will return to see me in early February and hopefully more information concerning this distal rectal mass/ ovarian mass will be known.   23 minutes of total time was spent for this patient encounter, including preparation, face-to-face counseling with the patient and coordination of care, physical exam, and documentation of the encounter. ____________________________________  Blair Promise, PhD, MD   This document serves as a record of services personally performed by Gery Pray, MD. It was created on his behalf by Roney Mans, a trained medical scribe. The creation of this record is based on the scribe's personal observations and the provider's statements to them. This  document has been checked and approved by the attending provider.

## 2020-12-25 ENCOUNTER — Ambulatory Visit
Admission: RE | Admit: 2020-12-25 | Discharge: 2020-12-25 | Disposition: A | Discharge status: Home / Self Care | Payer: Medicare Other | Source: Ambulatory Visit | Attending: Radiation Oncology | Admitting: Radiation Oncology

## 2020-12-25 ENCOUNTER — Encounter: Payer: Self-pay | Admitting: Radiation Oncology

## 2020-12-25 ENCOUNTER — Other Ambulatory Visit: Payer: Self-pay

## 2020-12-25 DIAGNOSIS — Z923 Personal history of irradiation: Secondary | ICD-10-CM | POA: Insufficient documentation

## 2020-12-25 DIAGNOSIS — C779 Secondary and unspecified malignant neoplasm of lymph node, unspecified: Secondary | ICD-10-CM

## 2020-12-25 DIAGNOSIS — Z79899 Other long term (current) drug therapy: Secondary | ICD-10-CM | POA: Insufficient documentation

## 2020-12-25 DIAGNOSIS — C774 Secondary and unspecified malignant neoplasm of inguinal and lower limb lymph nodes: Secondary | ICD-10-CM | POA: Diagnosis not present

## 2020-12-25 DIAGNOSIS — J45909 Unspecified asthma, uncomplicated: Secondary | ICD-10-CM | POA: Diagnosis not present

## 2020-12-25 DIAGNOSIS — C801 Malignant (primary) neoplasm, unspecified: Secondary | ICD-10-CM | POA: Diagnosis not present

## 2020-12-25 DIAGNOSIS — Z08 Encounter for follow-up examination after completed treatment for malignant neoplasm: Secondary | ICD-10-CM | POA: Diagnosis not present

## 2020-12-25 DIAGNOSIS — N839 Noninflammatory disorder of ovary, fallopian tube and broad ligament, unspecified: Secondary | ICD-10-CM | POA: Diagnosis not present

## 2020-12-25 DIAGNOSIS — Z8589 Personal history of malignant neoplasm of other organs and systems: Secondary | ICD-10-CM | POA: Insufficient documentation

## 2020-12-25 DIAGNOSIS — Z7901 Long term (current) use of anticoagulants: Secondary | ICD-10-CM | POA: Insufficient documentation

## 2020-12-25 NOTE — Progress Notes (Signed)
Lorraine Gilbert is here today for follow up post radiation to the pelvic.  They completed their radiation on:  07/18/18  Does the patient complain of any of the following:  Pain:Patient reports having acute pain to right pelvic area.  Abdominal bloating: no Diarrhea/Constipation: constipation, encouraged patient to try miralax. Patient voiced understanding.  Nausea/Vomiting: no Vaginal Discharge: no Blood in Urine or Stool: no Urinary Issues (dysuria/incomplete emptying/ incontinence/ increased frequency/urgency): no Post radiation skin changes: intact   Additional comments if applicable:   Vitals:   12/25/20 1604  BP: (!) 138/55  Pulse: 68  Resp: 18  Temp: (!) 97.4 F (36.3 C)  TempSrc: Temporal  SpO2: 99%  Weight: 135 lb 6 oz (61.4 kg)  Height: 5\' 3"  (1.6 m)

## 2020-12-26 DIAGNOSIS — I480 Paroxysmal atrial fibrillation: Secondary | ICD-10-CM | POA: Diagnosis not present

## 2020-12-26 DIAGNOSIS — I2510 Atherosclerotic heart disease of native coronary artery without angina pectoris: Secondary | ICD-10-CM | POA: Diagnosis not present

## 2021-01-01 DIAGNOSIS — J45991 Cough variant asthma: Secondary | ICD-10-CM | POA: Diagnosis not present

## 2021-01-01 DIAGNOSIS — I700 Atherosclerosis of aorta: Secondary | ICD-10-CM | POA: Diagnosis not present

## 2021-01-01 DIAGNOSIS — I4891 Unspecified atrial fibrillation: Secondary | ICD-10-CM | POA: Diagnosis not present

## 2021-01-01 DIAGNOSIS — E559 Vitamin D deficiency, unspecified: Secondary | ICD-10-CM | POA: Diagnosis not present

## 2021-01-01 DIAGNOSIS — E1169 Type 2 diabetes mellitus with other specified complication: Secondary | ICD-10-CM | POA: Diagnosis not present

## 2021-01-01 DIAGNOSIS — D6869 Other thrombophilia: Secondary | ICD-10-CM | POA: Diagnosis not present

## 2021-01-01 DIAGNOSIS — C801 Malignant (primary) neoplasm, unspecified: Secondary | ICD-10-CM | POA: Diagnosis not present

## 2021-01-01 DIAGNOSIS — I739 Peripheral vascular disease, unspecified: Secondary | ICD-10-CM | POA: Diagnosis not present

## 2021-01-01 DIAGNOSIS — C779 Secondary and unspecified malignant neoplasm of lymph node, unspecified: Secondary | ICD-10-CM | POA: Diagnosis not present

## 2021-01-01 DIAGNOSIS — K5900 Constipation, unspecified: Secondary | ICD-10-CM | POA: Diagnosis not present

## 2021-01-01 DIAGNOSIS — I2510 Atherosclerotic heart disease of native coronary artery without angina pectoris: Secondary | ICD-10-CM | POA: Diagnosis not present

## 2021-01-24 DIAGNOSIS — J45909 Unspecified asthma, uncomplicated: Secondary | ICD-10-CM | POA: Diagnosis not present

## 2021-01-28 DIAGNOSIS — R053 Chronic cough: Secondary | ICD-10-CM | POA: Diagnosis not present

## 2021-01-28 DIAGNOSIS — J300 Vasomotor rhinitis: Secondary | ICD-10-CM | POA: Diagnosis not present

## 2021-01-28 DIAGNOSIS — J4540 Moderate persistent asthma, uncomplicated: Secondary | ICD-10-CM | POA: Diagnosis not present

## 2021-01-30 DIAGNOSIS — K5900 Constipation, unspecified: Secondary | ICD-10-CM | POA: Diagnosis not present

## 2021-01-30 DIAGNOSIS — K6282 Dysplasia of anus: Secondary | ICD-10-CM | POA: Diagnosis not present

## 2021-01-30 DIAGNOSIS — R6889 Other general symptoms and signs: Secondary | ICD-10-CM | POA: Diagnosis not present

## 2021-02-02 DIAGNOSIS — K6289 Other specified diseases of anus and rectum: Secondary | ICD-10-CM | POA: Diagnosis not present

## 2021-02-16 DIAGNOSIS — E1139 Type 2 diabetes mellitus with other diabetic ophthalmic complication: Secondary | ICD-10-CM | POA: Diagnosis not present

## 2021-02-16 DIAGNOSIS — E119 Type 2 diabetes mellitus without complications: Secondary | ICD-10-CM | POA: Diagnosis not present

## 2021-02-16 DIAGNOSIS — J45909 Unspecified asthma, uncomplicated: Secondary | ICD-10-CM | POA: Diagnosis not present

## 2021-02-16 DIAGNOSIS — E039 Hypothyroidism, unspecified: Secondary | ICD-10-CM | POA: Diagnosis not present

## 2021-02-16 DIAGNOSIS — E7800 Pure hypercholesterolemia, unspecified: Secondary | ICD-10-CM | POA: Diagnosis not present

## 2021-02-16 DIAGNOSIS — E1169 Type 2 diabetes mellitus with other specified complication: Secondary | ICD-10-CM | POA: Diagnosis not present

## 2021-02-16 DIAGNOSIS — J45991 Cough variant asthma: Secondary | ICD-10-CM | POA: Diagnosis not present

## 2021-02-19 DIAGNOSIS — I4891 Unspecified atrial fibrillation: Secondary | ICD-10-CM | POA: Diagnosis not present

## 2021-02-19 DIAGNOSIS — Z79899 Other long term (current) drug therapy: Secondary | ICD-10-CM | POA: Diagnosis not present

## 2021-02-22 NOTE — Progress Notes (Signed)
Radiation Oncology         [phone] ________________________________  Name: Lorraine Gilbert MRN: [id number]  Date: 02/23/2021  DOB: [birth year]  Follow-Up Visit Note  CC: [name], MD  [name], MD    ICD-10-CM   1. Metastatic squamous cell carcinoma involving lymph node with unknown primary site [facility])  C77.9    C80.1       Diagnosis:  Metastatic squamous cell carcinoma presenting in the left inguinal area (unknown primary site)  Interval Since Last Radiation: 2 years, 7 months, and 1 week  Radiation Treatment Dates: 07/05/2018 - 07/18/2018:    Site/Dose: Left Inguinal / 30 Gy in 10 fractions   Narrative:  The patient returns today for routine follow-up, she was last seen here for follow up on 12/25/20.        Since she was last seen, she underwent a flexible sigmoidoscopy with endoscopic biopsies on 01/30/21. Sigmoidoscopy revealed a small lesion right above the anus. Pathology from anal biopsies collected revealed findings consistent with a high-grade squamous intraepithelial lesion / anal intraepithelial neoplasia.   Given the above potentially pre-cancerous finding, Dr. Percell Miller recommended a referral to a colorectal surgeon.     The patient reports that she will be seeing Dr. Evalyn Casco at the [facility] colon and rectal clinic in [place] on [place] February 9th at 10:15 AM.  No further imaging has been performed in the interval since the patient was last seen.       Patient reports some occasional right lower quadrant discomfort.  She denies any rectal bleeding vaginal bleeding hematuria.             Allergies:  is allergic to ibuprofen.  Meds: Current Outpatient Medications  Medication Sig Dispense Refill   ACCU-CHEK AVIVA PLUS test strip 1 each as directed.     Accu-Chek Softclix Lancets lancets 1 each by Other route as directed.     ACETAMINOPHEN 8 HOUR PO Take 1 tablet by mouth as needed.     albuterol (VENTOLIN HFA) 108 (90 Base) MCG/ACT  inhaler      Alcohol Swabs (ALCOHOL WIPES) 70 % PADS DropSafe Alcohol Prep Pads     amiodarone (PACERONE) 200 MG tablet Take 1 tablet by mouth daily.     apixaban (ELIQUIS) 2.5 MG TABS tablet Take 2.5 mg by mouth 2 (two) times daily.      Blood Glucose Calibration (ACCU-CHEK AVIVA) SOLN      escitalopram (LEXAPRO) 10 MG tablet Take 10 mg by mouth daily.     Multiple Vitamin (MULTIVITAMIN WITH MINERALS) TABS tablet Take 1 tablet by mouth daily.     nitroGLYCERIN (NITROSTAT) 0.4 MG SL tablet Place 0.4 mg under the tongue every 5 (five) minutes as needed for chest pain.     rosuvastatin (CRESTOR) 5 MG tablet Take by mouth.     Vitamin D, Ergocalciferol, (DRISDOL) 1.25 MG (50000 UT) CAPS capsule Take 1 capsule by mouth every Wednesday.     folic acid (FOLVITE) 1 MG tablet Take 1 mg by mouth every morning.  (Patient not taking: Reported on 02/23/2021)     METHOTREXATE PO methotrexate (PF) (Patient not taking: Reported on 02/23/2021)     No current facility-administered medications for this encounter.    Physical Findings: The patient is in no acute distress. Patient is alert and oriented.  height is 5\' 3"  (1.6 m) and weight is 140 lb (63.5 kg). Her temporal temperature is 96.4 F (35.8 C) (abnormal). Her blood  pressure is 153/53 (abnormal) and her pulse is 67. Her respiration is 18. .  No significant changes. Lungs are clear to auscultation bilaterally. Heart has regular rate and rhythm. No palpable cervical, supraclavicular, or axillary adenopathy. Abdomen soft, non-tender, normal bowel sounds.  Pelvic exam not performed today as she will be seeing colorectal surgeon later this week for exam.   Lab Findings: Lab Results  Component Value Date   WBC 7.7 08/07/2019   HGB 13.4 08/07/2019   HCT 40.3 08/07/2019   MCV 89.9 08/07/2019   PLT 301.0 08/07/2019    Radiographic Findings: No results found.  Impression:  Metastatic squamous cell carcinoma presenting in the left inguinal area (unknown  primary site)  The patient was noted to have a suspicious area in the anorectal region.  Biopsy of this area shows a high-grade squamous intraepithelial lesion/anal intraepithelial neoplasm.  Plan: Patient will follow-up in 3 months.  I discussed with patient that if she has documentation of squamous of carcinoma of the anus that she could have radiation therapy for this issue given that she only received left inguinal node radiation in the past.  She does live in the [place] area and may wish to receive her radiation therapy closer to home.   15 minutes of total time was spent for this patient encounter, including preparation, face-to-face counseling with the patient and coordination of care, physical exam, and documentation of the encounter. ____________________________________  Blair Promise, PhD, MD  This document serves as a record of services personally performed by Gery Pray, MD. It was created on his behalf by Roney Mans, a trained medical scribe. The creation of this record is based on the scribe's personal observations and the provider's statements to them. This document has been checked and approved by the attending provider.

## 2021-02-23 ENCOUNTER — Ambulatory Visit
Admission: RE | Admit: 2021-02-23 | Discharge: 2021-02-23 | Disposition: A | Discharge status: Home / Self Care | Payer: Medicare Other | Source: Ambulatory Visit | Attending: Radiation Oncology | Admitting: Radiation Oncology

## 2021-02-23 ENCOUNTER — Other Ambulatory Visit: Payer: Self-pay

## 2021-02-23 DIAGNOSIS — Z7901 Long term (current) use of anticoagulants: Secondary | ICD-10-CM | POA: Diagnosis not present

## 2021-02-23 DIAGNOSIS — Z08 Encounter for follow-up examination after completed treatment for malignant neoplasm: Secondary | ICD-10-CM | POA: Diagnosis not present

## 2021-02-23 DIAGNOSIS — D378 Neoplasm of uncertain behavior of other specified digestive organs: Secondary | ICD-10-CM | POA: Insufficient documentation

## 2021-02-23 DIAGNOSIS — C801 Malignant (primary) neoplasm, unspecified: Secondary | ICD-10-CM | POA: Diagnosis not present

## 2021-02-23 DIAGNOSIS — Z923 Personal history of irradiation: Secondary | ICD-10-CM | POA: Diagnosis not present

## 2021-02-23 DIAGNOSIS — Z8589 Personal history of malignant neoplasm of other organs and systems: Secondary | ICD-10-CM | POA: Insufficient documentation

## 2021-02-23 DIAGNOSIS — C779 Secondary and unspecified malignant neoplasm of lymph node, unspecified: Secondary | ICD-10-CM

## 2021-02-23 DIAGNOSIS — C774 Secondary and unspecified malignant neoplasm of inguinal and lower limb lymph nodes: Secondary | ICD-10-CM | POA: Diagnosis not present

## 2021-02-23 NOTE — Progress Notes (Signed)
Hilliary Jock is here today for follow up post radiation to the left pelvis.  They completed their radiation on: 07/18/2018   Does the patient complain of any of the following:  Pain: Patient reports having pain at times to right lower abdomen. Abdominal bloating: yes Diarrhea/Constipation: constipation, reports taking miralax as needed, which is effective.  Nausea/Vomiting: no Vaginal Discharge: no Blood in Urine or Stool: no Urinary Issues (dysuria/incomplete emptying/ incontinence/ increased frequency/urgency): no Does patient report using vaginal dilator 2-3 times a week and/or sexually active 2-3 weeks: N/A Post radiation skin changes: no   Additional comments if applicable    Vitals:   02/23/21 1519  BP: (!) 153/53  Pulse: 67  Resp: 18  Temp: (!) 96.4 F (35.8 C)  TempSrc: Temporal  Weight: 140 lb (63.5 kg)  Height: 5\' 3"  (1.6 m)

## 2021-02-24 ENCOUNTER — Telehealth: Payer: Self-pay | Admitting: *Deleted

## 2021-02-24 DIAGNOSIS — J45909 Unspecified asthma, uncomplicated: Secondary | ICD-10-CM | POA: Diagnosis not present

## 2021-02-24 NOTE — Telephone Encounter (Signed)
CALLED PATIENT TO INFORM OF FU APPT. WITH DR. [place] ON 06-01-21 @ 3 PM, LVM FOR A RETURN CALL

## 2021-02-26 ENCOUNTER — Other Ambulatory Visit: Payer: Self-pay | Admitting: Radiation Oncology

## 2021-02-26 DIAGNOSIS — C779 Secondary and unspecified malignant neoplasm of lymph node, unspecified: Secondary | ICD-10-CM

## 2021-02-26 DIAGNOSIS — K629 Disease of anus and rectum, unspecified: Secondary | ICD-10-CM | POA: Diagnosis not present

## 2021-02-27 ENCOUNTER — Telehealth: Payer: Self-pay | Admitting: Hematology

## 2021-02-27 NOTE — Telephone Encounter (Signed)
Scheduled appt per 2/9 referral. Spoke to pt who is aware of appt date and time.

## 2021-03-05 ENCOUNTER — Telehealth: Payer: Self-pay | Admitting: Radiation Oncology

## 2021-03-05 DIAGNOSIS — E119 Type 2 diabetes mellitus without complications: Secondary | ICD-10-CM | POA: Diagnosis not present

## 2021-03-05 DIAGNOSIS — K629 Disease of anus and rectum, unspecified: Secondary | ICD-10-CM | POA: Diagnosis not present

## 2021-03-05 DIAGNOSIS — I110 Hypertensive heart disease with heart failure: Secondary | ICD-10-CM | POA: Diagnosis not present

## 2021-03-05 DIAGNOSIS — J45909 Unspecified asthma, uncomplicated: Secondary | ICD-10-CM | POA: Diagnosis not present

## 2021-03-05 DIAGNOSIS — Z7901 Long term (current) use of anticoagulants: Secondary | ICD-10-CM | POA: Diagnosis not present

## 2021-03-05 DIAGNOSIS — F03A0 Unspecified dementia, mild, without behavioral disturbance, psychotic disturbance, mood disturbance, and anxiety: Secondary | ICD-10-CM | POA: Diagnosis not present

## 2021-03-05 DIAGNOSIS — I2510 Atherosclerotic heart disease of native coronary artery without angina pectoris: Secondary | ICD-10-CM | POA: Diagnosis not present

## 2021-03-05 DIAGNOSIS — Z955 Presence of coronary angioplasty implant and graft: Secondary | ICD-10-CM | POA: Diagnosis not present

## 2021-03-05 DIAGNOSIS — Z79899 Other long term (current) drug therapy: Secondary | ICD-10-CM | POA: Diagnosis not present

## 2021-03-05 DIAGNOSIS — I4891 Unspecified atrial fibrillation: Secondary | ICD-10-CM | POA: Diagnosis not present

## 2021-03-05 DIAGNOSIS — Z888 Allergy status to other drugs, medicaments and biological substances status: Secondary | ICD-10-CM | POA: Diagnosis not present

## 2021-03-05 DIAGNOSIS — I509 Heart failure, unspecified: Secondary | ICD-10-CM | POA: Diagnosis not present

## 2021-03-05 DIAGNOSIS — Z7902 Long term (current) use of antithrombotics/antiplatelets: Secondary | ICD-10-CM | POA: Diagnosis not present

## 2021-03-05 DIAGNOSIS — C20 Malignant neoplasm of rectum: Secondary | ICD-10-CM | POA: Diagnosis not present

## 2021-03-05 DIAGNOSIS — I272 Pulmonary hypertension, unspecified: Secondary | ICD-10-CM | POA: Diagnosis not present

## 2021-03-05 DIAGNOSIS — K6289 Other specified diseases of anus and rectum: Secondary | ICD-10-CM | POA: Diagnosis not present

## 2021-03-05 DIAGNOSIS — I351 Nonrheumatic aortic (valve) insufficiency: Secondary | ICD-10-CM | POA: Diagnosis not present

## 2021-03-05 DIAGNOSIS — M5116 Intervertebral disc disorders with radiculopathy, lumbar region: Secondary | ICD-10-CM | POA: Diagnosis not present

## 2021-03-05 DIAGNOSIS — E785 Hyperlipidemia, unspecified: Secondary | ICD-10-CM | POA: Diagnosis not present

## 2021-03-05 NOTE — Telephone Encounter (Signed)
Left voicemail 2/16 @ 10:28 am for patient to call our office.

## 2021-03-09 DIAGNOSIS — E1139 Type 2 diabetes mellitus with other diabetic ophthalmic complication: Secondary | ICD-10-CM | POA: Diagnosis not present

## 2021-03-09 DIAGNOSIS — C210 Malignant neoplasm of anus, unspecified: Secondary | ICD-10-CM | POA: Diagnosis not present

## 2021-03-09 DIAGNOSIS — I10 Essential (primary) hypertension: Secondary | ICD-10-CM | POA: Diagnosis not present

## 2021-03-09 DIAGNOSIS — K625 Hemorrhage of anus and rectum: Secondary | ICD-10-CM | POA: Diagnosis not present

## 2021-03-09 DIAGNOSIS — Z7951 Long term (current) use of inhaled steroids: Secondary | ICD-10-CM | POA: Diagnosis not present

## 2021-03-09 DIAGNOSIS — I509 Heart failure, unspecified: Secondary | ICD-10-CM | POA: Diagnosis not present

## 2021-03-09 DIAGNOSIS — Z7901 Long term (current) use of anticoagulants: Secondary | ICD-10-CM | POA: Diagnosis not present

## 2021-03-09 DIAGNOSIS — H269 Unspecified cataract: Secondary | ICD-10-CM | POA: Diagnosis not present

## 2021-03-09 DIAGNOSIS — H401131 Primary open-angle glaucoma, bilateral, mild stage: Secondary | ICD-10-CM | POA: Diagnosis not present

## 2021-03-09 DIAGNOSIS — Z79899 Other long term (current) drug therapy: Secondary | ICD-10-CM | POA: Diagnosis not present

## 2021-03-09 DIAGNOSIS — F32A Depression, unspecified: Secondary | ICD-10-CM | POA: Diagnosis not present

## 2021-03-09 DIAGNOSIS — R296 Repeated falls: Secondary | ICD-10-CM | POA: Diagnosis not present

## 2021-03-09 DIAGNOSIS — K921 Melena: Secondary | ICD-10-CM | POA: Diagnosis not present

## 2021-03-09 DIAGNOSIS — J45909 Unspecified asthma, uncomplicated: Secondary | ICD-10-CM | POA: Diagnosis not present

## 2021-03-09 DIAGNOSIS — I110 Hypertensive heart disease with heart failure: Secondary | ICD-10-CM | POA: Diagnosis not present

## 2021-03-09 DIAGNOSIS — Z955 Presence of coronary angioplasty implant and graft: Secondary | ICD-10-CM | POA: Diagnosis not present

## 2021-03-09 DIAGNOSIS — M1990 Unspecified osteoarthritis, unspecified site: Secondary | ICD-10-CM | POA: Diagnosis not present

## 2021-03-09 DIAGNOSIS — I2510 Atherosclerotic heart disease of native coronary artery without angina pectoris: Secondary | ICD-10-CM | POA: Diagnosis not present

## 2021-03-09 DIAGNOSIS — I351 Nonrheumatic aortic (valve) insufficiency: Secondary | ICD-10-CM | POA: Diagnosis not present

## 2021-03-09 DIAGNOSIS — S80211A Abrasion, right knee, initial encounter: Secondary | ICD-10-CM | POA: Diagnosis not present

## 2021-03-09 DIAGNOSIS — R7989 Other specified abnormal findings of blood chemistry: Secondary | ICD-10-CM | POA: Diagnosis not present

## 2021-03-09 DIAGNOSIS — Z886 Allergy status to analgesic agent status: Secondary | ICD-10-CM | POA: Diagnosis not present

## 2021-03-09 DIAGNOSIS — Z86010 Personal history of colonic polyps: Secondary | ICD-10-CM | POA: Diagnosis not present

## 2021-03-09 DIAGNOSIS — K629 Disease of anus and rectum, unspecified: Secondary | ICD-10-CM | POA: Diagnosis not present

## 2021-03-09 DIAGNOSIS — E785 Hyperlipidemia, unspecified: Secondary | ICD-10-CM | POA: Diagnosis not present

## 2021-03-09 DIAGNOSIS — I4891 Unspecified atrial fibrillation: Secondary | ICD-10-CM | POA: Diagnosis not present

## 2021-03-10 DIAGNOSIS — C210 Malignant neoplasm of anus, unspecified: Secondary | ICD-10-CM | POA: Insufficient documentation

## 2021-03-10 DIAGNOSIS — K625 Hemorrhage of anus and rectum: Secondary | ICD-10-CM | POA: Diagnosis not present

## 2021-03-11 ENCOUNTER — Inpatient Hospital Stay: Payer: Medicare Other | Attending: Hematology | Admitting: Hematology

## 2021-03-11 ENCOUNTER — Encounter: Payer: Self-pay | Admitting: Hematology

## 2021-03-11 ENCOUNTER — Other Ambulatory Visit: Payer: Self-pay

## 2021-03-11 DIAGNOSIS — M069 Rheumatoid arthritis, unspecified: Secondary | ICD-10-CM | POA: Insufficient documentation

## 2021-03-11 DIAGNOSIS — E119 Type 2 diabetes mellitus without complications: Secondary | ICD-10-CM | POA: Insufficient documentation

## 2021-03-11 DIAGNOSIS — Z79899 Other long term (current) drug therapy: Secondary | ICD-10-CM | POA: Insufficient documentation

## 2021-03-11 DIAGNOSIS — C210 Malignant neoplasm of anus, unspecified: Secondary | ICD-10-CM

## 2021-03-11 DIAGNOSIS — Z7901 Long term (current) use of anticoagulants: Secondary | ICD-10-CM | POA: Diagnosis not present

## 2021-03-11 DIAGNOSIS — I10 Essential (primary) hypertension: Secondary | ICD-10-CM | POA: Diagnosis not present

## 2021-03-11 DIAGNOSIS — Z96611 Presence of right artificial shoulder joint: Secondary | ICD-10-CM | POA: Diagnosis not present

## 2021-03-11 DIAGNOSIS — K219 Gastro-esophageal reflux disease without esophagitis: Secondary | ICD-10-CM | POA: Insufficient documentation

## 2021-03-11 NOTE — Progress Notes (Signed)
START ON PATHWAY REGIMEN - Anal Carcinoma     One cycle, concurrent with RT:     Fluorouracil      Mitomycin   **Always confirm dose/schedule in your pharmacy ordering system**  Patient Characteristics: Anal Canal Tumors, Newly Diagnosed - Locoregional Disease (Clinical Staging) Therapeutic Status: Newly Diagnosed - Locoregional Disease (Clinical Staging) AJCC T Category: cT1 AJCC N Category: cN0 AJCC M Category: cM0 AJCC 9 Stage Grouping: I Check here if patient was staged using an edition other than AJCC Staging 9th Edition: false Intent of Therapy: Curative Intent, Discussed with Patient 

## 2021-03-11 NOTE — Progress Notes (Signed)
I met with Ms Klas and her daughter in law before  her consultation with Dr Burr Medico.  I explained my role as a nurse navigator and provided my contact information. I explained the services provided at [facility] and provided written information.  I explained the alight grant and let  them know one of the financial advisors will reach out to  her at the time of her chemo education class, should she receive chemotherapy I briefly explained insertion and care of a port a cath.  I told them that she will be scheduled for chemotherapy education class prior to receiving chemotherapy.  All questions were answered.  She verbalized understanding.

## 2021-03-11 NOTE — Progress Notes (Signed)
Lorraine Gilbert   Telephone:([phone] Fax:([phone]) [place] Note   Patient Care Team: [name], MD as PCP - General (Family Medicine) Cameron Sprang, MD as Consulting Physician (Neurology)  Date of Service:  03/11/2021   CHIEF COMPLAINTS/PURPOSE OF CONSULTATION:  Anal Cancer  REFERRING PHYSICIAN:  Dr. Hilliard Clark  ASSESSMENT & PLAN:  Lorraine Gilbert is a 88 y.o.  female with a history of SCC in left groin with unknown primary in 01/2020   1. Anal Squamous Cell Carcinoma, [id number] -initially presented with palpable left groin lymph node in 01/2018. Biopsy on 02/27/18 showed metastatic squamous cell carcinoma, unknown primary. PET scan 03/23/18 also didn't show a primary.colonoscopy and GYN work up was negative for malignancy  -she was treated with radiation therapy under Dr. Sondra Come 6/17-6/30/20. -she presented back with constipation in 09/2020. Rectal exam showed a questionable abnormality. Sigmoidoscopy on 01/30/21 with Dr. Percell Miller showed a small tuft of tissue above the anal verge. Pathology showed high-grade squamous intraepithelial lesion/anal intraepithelial neoplasia. -she was referred to Dr. Hilliard Clark and underwent anal biopsy in the OR on 03/05/21. Per OR note" I could see a white, ulcerated lesion in the left posterolateral anal canal extending from anal verge to roughly 1.5cm proximal. It was fixed and bled easily). Pathology from rectal mass confirmed invasive squamous cell carcinoma, moderately differentiated, MMR normal. -she subsequently experienced rectal bleeding, requiring brief hospitalization. The bleeding has stopped since she held eliquis  -She is scheduled for PET scan for staging this Friday -I discussed the standard therapy for early stage anal cancer, which is concurrent chemoradiation with 5-FU and mitomycin. She is 88, but overall healthy and fit, will still be a candidate for chemotherapy.  I discussed the alternative therapy with surgery  alone with a permanent colostomy, radiation alone, or radiation with oral Xeloda.  After lengthy discussion with patient and her daughter-in-law, she wished to proceed with standard chemotherapy and radiation. --Chemotherapy consent: Side effects including but does not not limited to, fatigue, nausea, vomiting, diarrhea, hair loss, neuropathy, fluid retention, renal and kidney dysfunction, neutropenic fever, needed for blood transfusion, bleeding, were discussed with patient in great detail. She agrees to proceed. -The goal of therapy is curative -Due to her advanced age, I will reduce her chemo dose by 25%. -She is scheduled to see Dr. Sondra Come next week. Plan to start in a few weeks   2.  Hypertension, hyperlipidemia, atrial fibrillation, DM (not on meds) -f/u with PCP and will monitor her close on chemoRT  PLAN:  -PET scan later this week -she is scheduled to see Dr. Sondra Come next week  -if PET is negative for distant metastasis, plan to start chemoradiation with 5-FU and mitomycin with 25% dose reduction   Oncology History  Metastatic squamous cell carcinoma involving lymph node with unknown primary site [facility])  02/06/2018 Imaging   Impression CT abdomen and pelvis 1.  Enlarged left groin lymph node which is of uncertain etiology. This would be amenable to percutaneous sampling if deemed clinically appropriate. 2.  Hepatic steatosis. Focal area of low-attenuation in the far lateral left hepatic lobe, not clearly seen on the prior exam. Consider liver ultrasound for further evaluation.  3.  Mild L1 compression deformity which is new.   02/28/2018 Pathology Results   Left groin lymph node, needle core biopsy:       -Metastatic squamous cell carcinoma, see comment  P16 stain is strongly positive, suggestive of cervical or anogenital origin in this location. Immunohistochemical  stains for CK5, p63, and pan cytokeratin are positive. ER is weakly positive. CK7 and CK20 are negative.   03/15/2018  Pathology Results   1. Cervix, biopsy, 12 o'clock - BENIGN SQUAMOUS MUCOSA WITH INFLAMMATION. - NO DYSPLASIA OR MALIGNANCY. 2. Endocervix, curettage - BENIGN SQUAMOUS EPITHELIUM.   03/23/2018 PET scan   1. Isolated intensely hypermetabolic enlarged LEFT inguinal lymph node. No clear primary identified. 2. No abnormal activity associated with the vagina or anorectal tissue. 3. No hypermetabolic lymph nodes in the pelvis. 4. Two adjacent nodules in the RIGHT upper lobe with very low metabolic activity. These are not favored primary malignancies. Recommend follow-up per Fleischner criteria. Non-contrast chest CT at 6-12 months is recommended.    07/05/2018 - 07/18/2018 Radiation Therapy   Left inguinal area/nodes treated to 30 Gy in 10 fractions   09/22/2020 Imaging   EXAM: CT ABDOMEN PELVIS W IV CONTRAST   IMPRESSION:  1. Multicystic enhancing right adnexal lesion with mild surrounding stranding. Given short interval since recent CT 3 weeks prior, this is more suspicious for acute findings such as torsion or infection, less likely neoplasm. Recommend endovaginal ultrasound for further evaluation and follow-up to resolution.  2. Other stable chronic findings.    01/30/2021 Pathology Results   DIAGNOSIS   SMALL  TUFT OF TISSUE ABOVE ANAL VERGE, ENDOSCOPIC BIOPSIES:   - HIGH-GRADE SQUAMOUS INTRAEPITHELIAL LESION/ANAL INTRAEPITHELIAL NEOPLASIA 2-3 OUT OF 3 (HGSIL/AIN 2-3).   - SEE COMMENT.  ([id number])   COMMENT  MORPHOLOGICALLY THERE IS AT LEAST TWO THIRDS THICKNESS INVOLVEMENT BY DYSPLASTIC EPITHELIUM WITH MID-LEVEL MITOSES SECONDARY HOWEVER DUE TO POOR ORIENTATION AND MORPHOLOGIC ASSESSMENT IS 2 SERVICE MUCOSA IS ONLY PRESENT IN FOCAL AREAS MORPHOLOGICALLY THIS REPRESENTS AT LEAST AIN 2 AND MOST LIKELY AIN 3.  IN EITHER RESPECT THE FINDINGS ARE CONSISTENT WITH HIGH-GRADE SQUAMOUS INTRAEPITHELIAL LESION WHICH IS ALSO CONFIRMED BY STRONGLY BLOCK LIKE P16 IMMUNOHISTOCHEMICAL  POSITIVITY.     03/05/2021 Pathology Results   Final Diagnosis    Rectal mass, biopsy: -Invasive squamous cell carcinoma, moderately differentiated   Addendum 1    The carcinoma was analyzed by [facility] for DNA mismatch repair proteins.  The neoplasm retained nuclear expression of all four mismatch repair proteins, MLH1, PMS2, MSH2, MSH6.  Controls worked appropriately.        HISTORY OF PRESENTING ILLNESS:  Lorraine Gilbert 88 y.o. female is a here because of anal cancer. The patient was referred by Dr. Hilliard Clark. The patient presents to the clinic today accompanied by her daughter-in-law.   She was initially seen for left groin adenopathy back in 2020. Biopsy at that time confirmed metastatic squamous cell carcinoma. She was treated with radiation therapy under Dr. Sondra Come.  More recently, she presented with constipation. Rectal exam revealed a questionable abnormality. She met Dr. Percell Miller, who recommended sigmoidoscopy. This was performed on 01/30/21 showing only a "tuft" of tissue above the anal verge. Pathology confirmed HGSIL/AIL. She was referred to Dr. Hilliard Clark, who recommended additional biopsy. She was taken to the OR for anorectal exam with biopsy under anesthesia on 03/05/21. Pathology revealed invasive squamous cell carcinoma, moderately differentiated.  Her postoperative course was complicated by rectal bleeding after she restarted anticoagulants, which required hospitalization.   Today the patient notes they felt/feeling prior/after... -she denies pain but is wearing a pad to catch blood.  She has a PMHx of.... -A.fib -HTN, DM -GERD -rheumatoid arthritis -s/p right shoulder replacement  Socially... -she is married and lives with her husband, who is 38 years old, and a  son. Her daughter-in-law, who is with her today, notes they live 20 minutes away from the patient. -she has 5 children total.   REVIEW OF SYSTEMS:    Constitutional: Denies fevers, chills or abnormal night sweats Eyes: Denies  blurriness of vision, double vision or watery eyes Ears, nose, mouth, throat, and face: Denies mucositis or sore throat Respiratory: Denies cough, dyspnea or wheezes Cardiovascular: Denies palpitation, chest discomfort or lower extremity swelling Gastrointestinal:  Denies nausea, heartburn or change in bowel habits Skin: Denies abnormal skin rashes Lymphatics: Denies new lymphadenopathy or easy bruising Neurological:Denies numbness, tingling or new weaknesses Behavioral/Psych: Mood is stable, no new changes  All other systems were reviewed with the patient and are negative.   MEDICAL HISTORY:  Past Medical History:  Diagnosis Date   A-fib ([place])    Abnormal heart rhythm    Allergic rhinitis    Anxiety    Asthma    Diabetes mellitus without complication (HCC)    Type II   GERD (gastroesophageal reflux disease)    Headache    History of radiation therapy 07/05/2018-07/18/2018   left pelvis        Dr Gery Pray   Hypertension    Neuromuscular disorder ([place])    Siactic pain in right leg   Osteopenia    Rheumatoid arthritis ([place])    Rotator cuff tear arthropathy    Right   Vertigo     SURGICAL HISTORY: Past Surgical History:  Procedure Laterality Date   BIOPSY  12/13/2018   Procedure: BIOPSY;  Surgeon: Wonda Horner, MD;  Location: [facility];  Service: Endoscopy;;   CATARACT EXTRACTION     bilateral   COLONOSCOPY WITH PROPOFOL N/A 12/13/2018   Procedure: COLONOSCOPY WITH PROPOFOL;  Surgeon: Wonda Horner, MD;  Location: [facility];  Service: Endoscopy;  Laterality: N/A;   ESOPHAGOGASTRODUODENOSCOPY N/A 12/13/2018   Procedure: ESOPHAGOGASTRODUODENOSCOPY (EGD);  Surgeon: Wonda Horner, MD;  Location: [facility];  Service: Endoscopy;  Laterality: N/A;   EYE SURGERY     POLYPECTOMY  12/13/2018   Procedure: POLYPECTOMY;  Surgeon: Wonda Horner, MD;  Location: [facility];  Service: Endoscopy;;   REVERSE SHOULDER ARTHROPLASTY Right 08/04/2017   REVERSE SHOULDER  ARTHROPLASTY Right 08/04/2017   Procedure: RIGHT REVERSE SHOULDER ARTHROPLASTY;  Surgeon: Justice Britain, MD;  Location: [place];  Service: Orthopedics;  Laterality: Right;   TUBAL LIGATION      SOCIAL HISTORY: Social History   Socioeconomic History   Marital status: Married    Spouse name: Pascual   Number of children: 6   Years of education: Not on file   Highest education level: Not on file  Occupational History   Occupation: Retired Event organiser  Tobacco Use   Smoking status: Never   Smokeless tobacco: Never  Vaping Use   Vaping Use: Never used  Substance and Sexual Activity   Alcohol use: No    Alcohol/week: 0.0 standard drinks   Drug use: No   Sexual activity: Not on file  Other Topics Concern   Not on file  Social History Narrative   Right Handed   Lives with husband and son   One Story Home   Caffeine, Yes drinks coffee    Social Determinants of Health   Financial Resource Strain: Not on file  Food Insecurity: Not on file  Transportation Needs: Not on file  Physical Activity: Not on file  Stress: Not on file  Social Connections: Not on file  Intimate Partner Violence: Not on  file    FAMILY HISTORY: Family History  Problem Relation Age of Onset   Emphysema Father        smoked   Heart disease Mother    Heart attack Son    Breast cancer Neg Hx     ALLERGIES:  is allergic to ibuprofen.  MEDICATIONS:  Current Outpatient Medications  Medication Sig Dispense Refill   ACCU-CHEK AVIVA PLUS test strip 1 each as directed.     Accu-Chek Softclix Lancets lancets 1 each by Other route as directed.     ACETAMINOPHEN 8 HOUR PO Take 1 tablet by mouth as needed.     Alcohol Swabs (ALCOHOL WIPES) 70 % PADS DropSafe Alcohol Prep Pads     amiodarone (PACERONE) 200 MG tablet Take 1 tablet by mouth daily.     Blood Glucose Calibration (ACCU-CHEK AVIVA) SOLN      escitalopram (LEXAPRO) 10 MG tablet Take 10 mg by mouth daily.     nitroGLYCERIN (NITROSTAT) 0.4 MG  SL tablet Place 0.4 mg under the tongue every 5 (five) minutes as needed for chest pain.     rosuvastatin (CRESTOR) 5 MG tablet Take by mouth.     No current facility-administered medications for this visit.    PHYSICAL EXAMINATION: ECOG PERFORMANCE STATUS: 1 - Symptomatic but completely ambulatory  Vitals:   03/11/21 1500  BP: (!) 142/59  Pulse: 70  Resp: 18  Temp: 98.4 F (36.9 C)  SpO2: 99%   Filed Weights   03/11/21 1500  Weight: 138 lb 11.2 oz (62.9 kg)    GENERAL:alert, no distress and comfortable SKIN: skin color, texture, turgor are normal, no rashes or significant lesions EYES: normal, Conjunctiva are pink and non-injected, sclera clear  NECK: supple, thyroid normal size, non-tender, without nodularity LYMPH:  no palpable lymphadenopathy in the cervical, axillary or inguinal LUNGS: clear to auscultation and percussion with normal breathing effort HEART: regular rate & rhythm and no murmurs and no lower extremity edema ABDOMEN:abdomen soft, non-tender and normal bowel sounds.  Exam of her anal area showed no visible mass or skin lesions, rectal exam was not performed today due to her recent rectal bleeding and soreness. Musculoskeletal:no cyanosis of digits and no clubbing  NEURO: alert & oriented x 3 with fluent speech, no focal motor/sensory deficits  LABORATORY DATA:  I have reviewed the data as listed CBC Latest Ref Rng & Units 08/07/2019 07/28/2017  WBC 4.0 - 10.5 K/uL 7.7 9.4  Hemoglobin 12.0 - 15.0 g/dL 13.4 13.6  Hematocrit 36.0 - 46.0 % 40.3 43.6  Platelets 150.0 - 400.0 K/uL 301.0 256    CMP Latest Ref Rng & Units 07/28/2017  Glucose 70 - 99 mg/dL 179(H)  BUN 8 - 23 mg/dL 15  Creatinine 0.44 - 1.00 mg/dL 0.89  Sodium 135 - 145 mmol/L 139  Potassium 3.5 - 5.1 mmol/L 4.8  Chloride 98 - 111 mmol/L 107  CO2 22 - 32 mmol/L 24  Calcium 8.9 - 10.3 mg/dL 9.3     RADIOGRAPHIC STUDIES: I have personally reviewed the radiological images as listed and agreed  with the findings in the report. No results found.   No orders of the defined types were placed in this encounter.   All questions were answered. The patient knows to call the clinic with any problems, questions or concerns. The total time spent in the appointment was 60 minutes.     Truitt Merle, MD 03/11/2021 10:42 PM  I, Wilburn Mylar, am acting as scribe for Truitt Merle,  Truitt Merle, MD.   I have reviewed the above documentation for accuracy and completeness, and I agree with the above.

## 2021-03-13 ENCOUNTER — Other Ambulatory Visit: Payer: Self-pay

## 2021-03-13 ENCOUNTER — Encounter (HOSPITAL_COMMUNITY)
Admission: RE | Admit: 2021-03-13 | Discharge: 2021-03-13 | Disposition: A | Discharge status: Home / Self Care | Payer: Medicare Other | Source: Ambulatory Visit | Attending: Radiation Oncology | Admitting: Radiation Oncology

## 2021-03-13 DIAGNOSIS — C801 Malignant (primary) neoplasm, unspecified: Secondary | ICD-10-CM | POA: Insufficient documentation

## 2021-03-13 DIAGNOSIS — C779 Secondary and unspecified malignant neoplasm of lymph node, unspecified: Secondary | ICD-10-CM | POA: Diagnosis not present

## 2021-03-13 DIAGNOSIS — C44520 Squamous cell carcinoma of anal skin: Secondary | ICD-10-CM | POA: Diagnosis not present

## 2021-03-13 LAB — GLUCOSE, CAPILLARY: Glucose-Capillary: 99 mg/dL (ref 70–99)

## 2021-03-13 MED ORDER — FLUDEOXYGLUCOSE F - 18 (FDG) INJECTION
6.9000 | Freq: Once | INTRAVENOUS | Status: AC
  Administered 2021-03-13: 6.83 via INTRAVENOUS

## 2021-03-13 NOTE — Progress Notes (Signed)
I faxed request to [facility] pathology department for [facility] pathology tissue to be sent here for [facility] review.

## 2021-03-13 NOTE — Progress Notes (Signed)
GI Location of Tumor / Histology: anus ? ?Lorraine Gilbert presented with rectal bleeding ? ?Biopsies of anal lesion (if applicable) revealed:  ?she presented back with constipation in 09/2020. Rectal exam showed a questionable abnormality. Sigmoidoscopy on 01/30/21 with Dr. Percell Miller showed a small tuft of tissue above the anal verge. Pathology showed high-grade squamous intraepithelial lesion/anal intraepithelial neoplasia. ? ?Past/Anticipated interventions by surgeon, if any:  discussed the alternative therapy with surgery alone with a permanent colostomy, radiation alone, or radiation with oral Xeloda.  After lengthy discussion with patient and her daughter-in-law, she wished to proceed with standard chemotherapy and radiation. ? ?Past/Anticipated interventions by medical oncology, if any:  ?-if PET is negative for distant metastasis, plan to start chemoradiation with 5-FU and mitomycin with 25% dose reduction ? ?Weight changes, if any: no ? ?Bowel/Bladder complaints, if any: rectal bleeding which has stopped, urgency, nocturia x 0-1 ? ?Nausea / Vomiting, if any: no ? ?Pain issues, if any:  no ? ?Any blood per rectum:   yes ? ?SAFETY ISSUES: ?Prior radiation? yes,  ?07/05/2018 - 07/18/2018 Radiation Therapy  ?  Left inguinal area/nodes treated to 30 Gy in 10 fractions  ? ?Pacemaker/ICD? no ?Possible current pregnancy? no, postmenopausal ?Is the patient on methotrexate? no ? ?Current Complaints/Details: nothing of note ? ?Vitals:  ? 03/18/21 0840  ?BP: (!) 159/43  ?Pulse: 62  ?Resp: 20  ?Temp: 97.9 ?F (36.6 ?C)  ?SpO2: 100%  ?Weight: 138 lb 9.6 oz (62.9 kg)  ?Height: 5\' 2"  (1.575 m)  ? ? ?

## 2021-03-16 DIAGNOSIS — C210 Malignant neoplasm of anus, unspecified: Secondary | ICD-10-CM | POA: Diagnosis not present

## 2021-03-17 ENCOUNTER — Telehealth: Payer: Self-pay | Admitting: Hematology

## 2021-03-17 ENCOUNTER — Encounter: Payer: Self-pay | Admitting: Hematology

## 2021-03-17 DIAGNOSIS — C210 Malignant neoplasm of anus, unspecified: Secondary | ICD-10-CM | POA: Diagnosis not present

## 2021-03-17 NOTE — Telephone Encounter (Signed)
.  Called patient to schedule appointment per 2/28 inbasket, left patient msg

## 2021-03-17 NOTE — Progress Notes (Signed)
I left vm for Lorraine Gilbert to call me to review treatment plan.

## 2021-03-17 NOTE — Addendum Note (Signed)
Addended by: Ihor Gully A on: 03/17/2021 09:42 AM   Modules accepted: Orders

## 2021-03-17 NOTE — Progress Notes (Signed)
I spoke with Ms Kille's daughter in law, [name].  I reviewed the treatment plan for Ms Collard and the coordination of PICC placement, chemotherapy, radiation therapy, and follow up appts. I told her that myself or a scheduler will call when the appts have been made.  She is aware of her chemo ed appt on Friday.  All questions were answered.  She verbalized understanding.

## 2021-03-17 NOTE — Progress Notes (Signed)
Radiation Oncology         [phone] ________________________________  Outpatient Re-Consultation  Name: Lorraine Gilbert MRN: [id number]  Date: 03/18/2021  DOB: 1932-12-27  HU:TMLYYT, Shanon Brow, MD  [name], MD   REFERRING PHYSICIAN: [name], MD  Diagnosis:  Newly diagnosed stage I (cT1, cN0, cM0) anal cancer  Prior history of: metastatic squamous cell carcinoma presenting in the left inguinal area (unknown primary site)   Interval Since Last Radiation: 2 years, 7 months, and 30 days    Radiation Treatment Dates: 07/05/2018 - 07/18/2018:    Site/Dose: Left Inguinal / 30 Gy in 10 fractions    Narrative:  The patient returns today for re-evaluation, she was last seen here for follow-up on 02/23/21. To review from our last visit, the patient underwent a flexible sigmoidoscopy with endoscopic biopsies on 01/30/21. Sigmoidoscopy revealed a small lesion right above the anus. Pathology from anal biopsies collected revealed findings consistent with a high-grade squamous intraepithelial lesion / anal intraepithelial neoplasia. Given the above potentially pre-cancerous finding, Dr. Percell Miller recommended a referral to a colorectal surgeon.     Since then, the patient proceeded to undergo an anorectal examination under anesthesia with biopsies at [facility] on 03/05/21. Biopsy of the rectal mass revealed findings consistent with moderately differentiated invasive squamous cell carcinoma; p16 positive.    Shortly after, the patient presented to [facility] on 03/09/21 complaints of rectal bleeding since 10:00 pm the prior evening. Following evaluation, the patient underwent cauterization with some improvement of bleeding, and AgNO3 was applied. The patient was also instructed to hold her blood thinner agent for the next 2 days and was discharged home in stable condition.   Subsequently, the patient was referred to Dr. Burr Medico on 03/11/21 to discuss treatment options. Following  discussion of the risks and benefits, the patient agreed to pursue treatment consisting of concurrent chemoradiation. Given her advanced age,  Dr. Burr Medico also noted that she will reduce her chemo dosage by 25%.   Pertinent imaging since the patient was last seen includes the following:  --Restaging PET on 03/13/21 demonstrated intense metabolic activity through the anal canal, consistent with the patient's recently diagnosed anal carcinoma. Otherwise, PET showed no evidence of metastatic adenopathy in the pelvis or inguinal nodes, and no evidence of distant metastatic adenopathy, visceral metastasis, or skeletal metastasis.  She denies any pelvic pain at this time.  She denies any further vaginal bleeding since that one episode after her biopsy.   PAST MEDICAL HISTORY:  Past Medical History:  Diagnosis Date   A-fib ([place])    Abnormal heart rhythm    Allergic rhinitis    Anxiety    Asthma    Diabetes mellitus without complication (HCC)    Type II   GERD (gastroesophageal reflux disease)    Headache    History of radiation therapy 07/05/2018-07/18/2018   left pelvis        Dr Gery Pray   Hypertension    Neuromuscular disorder ([place])    Siactic pain in right leg   Osteopenia    Rheumatoid arthritis ([place])    Rotator cuff tear arthropathy    Right   Vertigo     PAST SURGICAL HISTORY: Past Surgical History:  Procedure Laterality Date   BIOPSY  12/13/2018   Procedure: BIOPSY;  Surgeon: Wonda Horner, MD;  Location: [facility];  Service: Endoscopy;;   CATARACT EXTRACTION     bilateral   COLONOSCOPY WITH PROPOFOL N/A 12/13/2018   Procedure: COLONOSCOPY  WITH PROPOFOL;  Surgeon: Wonda Horner, MD;  Location: [facility];  Service: Endoscopy;  Laterality: N/A;   ESOPHAGOGASTRODUODENOSCOPY N/A 12/13/2018   Procedure: ESOPHAGOGASTRODUODENOSCOPY (EGD);  Surgeon: Wonda Horner, MD;  Location: [facility];  Service: Endoscopy;  Laterality: N/A;   EYE SURGERY     POLYPECTOMY  12/13/2018    Procedure: POLYPECTOMY;  Surgeon: Wonda Horner, MD;  Location: [facility];  Service: Endoscopy;;   REVERSE SHOULDER ARTHROPLASTY Right 08/04/2017   REVERSE SHOULDER ARTHROPLASTY Right 08/04/2017   Procedure: RIGHT REVERSE SHOULDER ARTHROPLASTY;  Surgeon: Justice Britain, MD;  Location: [facility];  Service: Orthopedics;  Laterality: Right;   TUBAL LIGATION      FAMILY HISTORY:  Family History  Problem Relation Age of Onset   Emphysema Father        smoked   Heart disease Mother    Heart attack Son    Breast cancer Neg Hx     SOCIAL HISTORY:  Social History   Tobacco Use   Smoking status: Never   Smokeless tobacco: Never  Vaping Use   Vaping Use: Never used  Substance Use Topics   Alcohol use: No    Alcohol/week: 0.0 standard drinks   Drug use: No    ALLERGIES:  Allergies  Allergen Reactions   Ibuprofen Other (See Comments)    Confusion, Patient states that the 800mg  Motrin made her feel like she was flying.    MEDICATIONS:  Current Outpatient Medications  Medication Sig Dispense Refill   ACCU-CHEK AVIVA PLUS test strip 1 each as directed.     Accu-Chek Softclix Lancets lancets 1 each by Other route as directed.     ACETAMINOPHEN 8 HOUR PO Take 1 tablet by mouth as needed.     albuterol (PROVENTIL) (2.5 MG/3ML) 0.083% nebulizer solution SMARTSIG:1 Vial(s) Via Nebulizer Every 4-6 Hours PRN     Alcohol Swabs (ALCOHOL WIPES) 70 % PADS DropSafe Alcohol Prep Pads     amiodarone (PACERONE) 200 MG tablet Take 1 tablet by mouth daily.     Blood Glucose Calibration (ACCU-CHEK AVIVA) SOLN      escitalopram (LEXAPRO) 10 MG tablet Take 10 mg by mouth daily.     folic acid (FOLVITE) 1 MG tablet Take by mouth.     levalbuterol (XOPENEX) 1.25 MG/3ML nebulizer solution Inhale into the lungs.     nitroGLYCERIN (NITROSTAT) 0.4 MG SL tablet Place 0.4 mg under the tongue every 5 (five) minutes as needed for chest pain.     polyethylene glycol powder (GLYCOLAX/MIRALAX) 17 GM/SCOOP powder  Take by mouth.     rosuvastatin (CRESTOR) 5 MG tablet Take by mouth.     TRELEGY ELLIPTA 200-62.5-25 MCG/ACT AEPB Take 1 puff by mouth daily.     Vitamin D, Ergocalciferol, 50000 units CAPS 1 capsule     No current facility-administered medications for this encounter.    REVIEW OF SYSTEMS:  A 10+ POINT REVIEW OF SYSTEMS WAS OBTAINED including neurology, dermatology, psychiatry, cardiac, respiratory, lymph, extremities, GI, GU, musculoskeletal, constitutional, reproductive, HEENT. She denies any pelvic pain at this time.  She denies any further rectal bleeding since that one episode after her biopsy.   PHYSICAL EXAM:  height is 5\' 2"  (1.575 m) and weight is 138 lb 9.6 oz (62.9 kg). Her temperature is 97.9 F (36.6 C). Her blood pressure is 159/43 (abnormal) and her pulse is 62. Her respiration is 20 and oxygen saturation is 100%.   General: Alert and oriented, in no acute distress HEENT: Head  is normocephalic. Extraocular movements are intact.  Neck: Neck is supple, no palpable cervical or supraclavicular lymphadenopathy. Heart: Regular in rate and rhythm with no murmurs, rubs, or gallops. Chest: Clear to auscultation bilaterally, with no rhonchi, wheezes, or rales. Abdomen: Soft, nontender, nondistended, with no rigidity or guarding. Extremities: No cyanosis or edema. Lymphatics: see Neck Exam Skin: No concerning lesions. Musculoskeletal: symmetric strength and muscle tone throughout. Neurologic: Cranial nerves II through XII are grossly intact. No obvious focalities. Speech is fluent. Coordination is intact. Psychiatric: Judgment and insight are intact. Affect is appropriate. Rectal exam not performed today in light of recent biopsy and associated bleeding after that biopsy.  ECOG = 1  0 - Asymptomatic (Fully active, able to carry on all predisease activities without restriction)  1 - Symptomatic but completely ambulatory (Restricted in physically strenuous activity but ambulatory and  able to carry out work of a light or sedentary nature. For example, light housework, office work)  2 - Symptomatic, <50% in bed during the day (Ambulatory and capable of all self care but unable to carry out any work activities. Up and about more than 50% of waking hours)  3 - Symptomatic, >50% in bed, but not bedbound (Capable of only limited self-care, confined to bed or chair 50% or more of waking hours)  4 - Bedbound (Completely disabled. Cannot carry on any self-care. Totally confined to bed or chair)  5 - Death   Eustace Pen MM, Creech RH, Tormey DC, et al. [phone]). "Toxicity and response criteria of the [facility]". [place] Oncol. 5 (6): 649-55  LABORATORY DATA:  Lab Results  Component Value Date   WBC 7.7 08/07/2019   HGB 13.4 08/07/2019   HCT 40.3 08/07/2019   MCV 89.9 08/07/2019   PLT 301.0 08/07/2019   NEUTROABS 4.7 08/07/2019   Lab Results  Component Value Date   NA 139 07/28/2017   K 4.8 07/28/2017   CL 107 07/28/2017   CO2 24 07/28/2017   GLUCOSE 179 (H) 07/28/2017   CREATININE 0.89 07/28/2017   CALCIUM 9.3 07/28/2017      RADIOGRAPHY: NM PET Image Restag (PS) Skull Base To Thigh  Result Date: 03/13/2021 CLINICAL DATA:  Subsequent treatment strategy for squamous cell carcinoma the anus. Prior LEFT groin carcinoma. EXAM: NUCLEAR MEDICINE PET SKULL BASE TO THIGH TECHNIQUE: 6.8 mCi F-18 FDG was injected intravenously. Full-ring PET imaging was performed from the skull base to thigh after the radiotracer. CT data was obtained and used for attenuation correction and anatomic localization. Fasting blood glucose: 99 mg/dl COMPARISON:  None. FINDINGS: Mediastinal blood pool activity: SUV max 3.0 Liver activity: SUV max NA NECK: No hypermetabolic lymph nodes in the neck. Incidental CT findings: none CHEST: No hypermetabolic mediastinal or hilar nodes. No suspicious pulmonary nodules on the CT scan. Incidental CT findings: Coronary artery calcification and  aortic atherosclerotic calcification. ABDOMEN/PELVIS: Intense radiotracer activity through the distal rectum anal canal with SUV max equal 14.0 (image 182). No discrete lesion on noncontrast CT. No hypermetabolic lymph nodes in the pelvis. No hypermetabolic inguinal lymph nodes. No periaortic hypermetabolic nodes No abnormal metabolic activity liver. Incidental CT findings: Atherosclerotic calcification of the aorta. SKELETON: No focal hypermetabolic activity to suggest skeletal metastasis. Incidental CT findings: none IMPRESSION: 1. Intense metabolic activity through the anal canal would be consistent patient's history of anal carcinoma. 2. No metastatic adenopathy in the pelvis or inguinal nodal stations. 3. No distant metastatic adenopathy, visceral metastasis, or skeletal metastasis Electronically Signed  By: Suzy Bouchard M.D.   On: 03/13/2021 11:06      IMPRESSION: Newly diagnosed Stage I (cT1, cN0, cM0) Anal Cancer  She would be a good candidate for a definitive course of radiation along with radiosensitizing chemotherapy.  She does have a history of previous left inguinal radiation therapy so we will avoid elective coverage of the left inguinal region with her pelvic radiation treatments.  Today, I talked to the patient and daughter-in-law about the findings and work-up thus far.  We discussed the natural history of anal carcinoma and general treatment, highlighting the role of radiotherapy in the management.  We discussed the available radiation techniques, and focused on the details of logistics and delivery.  We reviewed the anticipated acute and late sequelae associated with radiation in this setting.  The patient was encouraged to ask questions that I answered to the best of my ability.  A patient consent form was discussed and signed.  We retained a copy for our records.  The patient would like to proceed with radiation and will be scheduled for CT simulation.  PLAN: She will return  tomorrow for CT simulation.  Anticipate radiation and radiosensitizing chemotherapy starting the week of March 13.  Anticipate 5 and half weeks of radiation therapy.   35 minutes of total time was spent for this patient encounter, including preparation, face-to-face counseling with the patient and coordination of care, physical exam, and documentation of the encounter.   ------------------------------------------------  Blair Promise, PhD, MD  This document serves as a record of services personally performed by Gery Pray, MD. It was created on his behalf by Roney Mans, a trained medical scribe. The creation of this record is based on the scribe's personal observations and the provider's statements to them. This document has been checked and approved by the attending provider.

## 2021-03-17 NOTE — Telephone Encounter (Signed)
.  Called patient to schedule appointment per 2/28 inbasket, patient support is aware of date and time.

## 2021-03-18 ENCOUNTER — Other Ambulatory Visit: Payer: Self-pay

## 2021-03-18 ENCOUNTER — Ambulatory Visit
Admission: RE | Admit: 2021-03-18 | Discharge: 2021-03-18 | Disposition: A | Discharge status: Home / Self Care | Payer: Medicare Other | Source: Ambulatory Visit | Attending: Radiation Oncology | Admitting: Radiation Oncology

## 2021-03-18 ENCOUNTER — Ambulatory Visit: Payer: Medicare Other

## 2021-03-18 ENCOUNTER — Encounter: Payer: Self-pay | Admitting: Radiation Oncology

## 2021-03-18 ENCOUNTER — Ambulatory Visit: Payer: Medicare Other | Admitting: Radiation Oncology

## 2021-03-18 VITALS — BP 159/43 | HR 62 | Temp 97.9°F | Resp 20 | Ht 62.0 in | Wt 138.6 lb

## 2021-03-18 DIAGNOSIS — I4891 Unspecified atrial fibrillation: Secondary | ICD-10-CM | POA: Diagnosis not present

## 2021-03-18 DIAGNOSIS — C210 Malignant neoplasm of anus, unspecified: Secondary | ICD-10-CM

## 2021-03-18 DIAGNOSIS — Z9221 Personal history of antineoplastic chemotherapy: Secondary | ICD-10-CM | POA: Insufficient documentation

## 2021-03-18 DIAGNOSIS — I10 Essential (primary) hypertension: Secondary | ICD-10-CM | POA: Insufficient documentation

## 2021-03-18 DIAGNOSIS — E119 Type 2 diabetes mellitus without complications: Secondary | ICD-10-CM | POA: Insufficient documentation

## 2021-03-18 DIAGNOSIS — Z79899 Other long term (current) drug therapy: Secondary | ICD-10-CM | POA: Diagnosis not present

## 2021-03-18 DIAGNOSIS — M8580 Other specified disorders of bone density and structure, unspecified site: Secondary | ICD-10-CM | POA: Insufficient documentation

## 2021-03-18 DIAGNOSIS — C801 Malignant (primary) neoplasm, unspecified: Secondary | ICD-10-CM

## 2021-03-18 DIAGNOSIS — F419 Anxiety disorder, unspecified: Secondary | ICD-10-CM | POA: Insufficient documentation

## 2021-03-18 DIAGNOSIS — I700 Atherosclerosis of aorta: Secondary | ICD-10-CM | POA: Insufficient documentation

## 2021-03-18 DIAGNOSIS — C779 Secondary and unspecified malignant neoplasm of lymph node, unspecified: Secondary | ICD-10-CM

## 2021-03-18 DIAGNOSIS — M069 Rheumatoid arthritis, unspecified: Secondary | ICD-10-CM | POA: Diagnosis not present

## 2021-03-18 DIAGNOSIS — K219 Gastro-esophageal reflux disease without esophagitis: Secondary | ICD-10-CM | POA: Diagnosis not present

## 2021-03-18 DIAGNOSIS — Z923 Personal history of irradiation: Secondary | ICD-10-CM | POA: Insufficient documentation

## 2021-03-18 NOTE — Progress Notes (Signed)
See MD note for nursing evaluation. °

## 2021-03-19 ENCOUNTER — Ambulatory Visit
Admission: RE | Admit: 2021-03-19 | Discharge: 2021-03-19 | Disposition: A | Discharge status: Home / Self Care | Payer: Medicare Other | Source: Ambulatory Visit | Attending: Radiation Oncology | Admitting: Radiation Oncology

## 2021-03-19 DIAGNOSIS — Z923 Personal history of irradiation: Secondary | ICD-10-CM | POA: Insufficient documentation

## 2021-03-19 DIAGNOSIS — I10 Essential (primary) hypertension: Secondary | ICD-10-CM | POA: Diagnosis not present

## 2021-03-19 DIAGNOSIS — C210 Malignant neoplasm of anus, unspecified: Secondary | ICD-10-CM | POA: Insufficient documentation

## 2021-03-19 DIAGNOSIS — I4891 Unspecified atrial fibrillation: Secondary | ICD-10-CM | POA: Diagnosis not present

## 2021-03-19 DIAGNOSIS — Z79899 Other long term (current) drug therapy: Secondary | ICD-10-CM | POA: Diagnosis not present

## 2021-03-19 DIAGNOSIS — E119 Type 2 diabetes mellitus without complications: Secondary | ICD-10-CM | POA: Diagnosis not present

## 2021-03-19 DIAGNOSIS — R519 Headache, unspecified: Secondary | ICD-10-CM | POA: Insufficient documentation

## 2021-03-19 DIAGNOSIS — E785 Hyperlipidemia, unspecified: Secondary | ICD-10-CM | POA: Insufficient documentation

## 2021-03-19 DIAGNOSIS — Z510 Encounter for antineoplastic radiation therapy: Secondary | ICD-10-CM | POA: Insufficient documentation

## 2021-03-19 DIAGNOSIS — R0781 Pleurodynia: Secondary | ICD-10-CM | POA: Diagnosis not present

## 2021-03-19 DIAGNOSIS — Z5111 Encounter for antineoplastic chemotherapy: Secondary | ICD-10-CM | POA: Diagnosis not present

## 2021-03-19 DIAGNOSIS — C774 Secondary and unspecified malignant neoplasm of inguinal and lower limb lymph nodes: Secondary | ICD-10-CM | POA: Insufficient documentation

## 2021-03-19 DIAGNOSIS — Z7901 Long term (current) use of anticoagulants: Secondary | ICD-10-CM | POA: Diagnosis not present

## 2021-03-19 DIAGNOSIS — R197 Diarrhea, unspecified: Secondary | ICD-10-CM | POA: Diagnosis not present

## 2021-03-19 DIAGNOSIS — R3915 Urgency of urination: Secondary | ICD-10-CM | POA: Insufficient documentation

## 2021-03-19 NOTE — Progress Notes (Signed)
I spoke with Ms Bragdon's daughter in law, Serafina Royals to review new med/onc schedule.  Chemo ed class rescheduled per her request.  I have mailed the March appt calender to [place].  All questions were answered.  She verbalized understanding. ? ?

## 2021-03-20 ENCOUNTER — Other Ambulatory Visit: Payer: Medicare Other

## 2021-03-23 ENCOUNTER — Ambulatory Visit: Payer: Medicare Other

## 2021-03-23 ENCOUNTER — Other Ambulatory Visit: Payer: Medicare Other

## 2021-03-23 ENCOUNTER — Ambulatory Visit: Payer: Medicare Other | Admitting: Hematology

## 2021-03-23 NOTE — Progress Notes (Signed)
Pharmacist Chemotherapy Monitoring - Initial Assessment   ? ?Anticipated start date: 03/30/2021  ? ?The following has been reviewed per standard work regarding the patient's treatment regimen: ?The patient's diagnosis, treatment plan and drug doses, and organ/hematologic function ?Lab orders and baseline tests specific to treatment regimen  ?The treatment plan start date, drug sequencing, and pre-medications ?Prior authorization status  ?Patient's documented medication list, including drug-drug interaction screen and prescriptions for anti-emetics and supportive care specific to the treatment regimen ?The drug concentrations, fluid compatibility, administration routes, and timing of the medications to be used ?The patient's access for treatment and lifetime cumulative dose history, if applicable  ?The patient's medication allergies and previous infusion related reactions, if applicable  ? ?Changes made to treatment plan:  ?N/A ? ?Follow up needed:  ?N/A ? ? ?Larene Beach, RPH, ?03/23/2021  3:04 PM ? ?

## 2021-03-24 DIAGNOSIS — J45909 Unspecified asthma, uncomplicated: Secondary | ICD-10-CM | POA: Diagnosis not present

## 2021-03-25 ENCOUNTER — Other Ambulatory Visit: Payer: Self-pay

## 2021-03-25 DIAGNOSIS — I739 Peripheral vascular disease, unspecified: Secondary | ICD-10-CM | POA: Diagnosis not present

## 2021-03-25 DIAGNOSIS — I480 Paroxysmal atrial fibrillation: Secondary | ICD-10-CM | POA: Diagnosis not present

## 2021-03-25 DIAGNOSIS — I2510 Atherosclerotic heart disease of native coronary artery without angina pectoris: Secondary | ICD-10-CM | POA: Diagnosis not present

## 2021-03-25 NOTE — Progress Notes (Signed)
The proposed treatment discussed in conference is for discussion purpose only and is not a binding recommendation.  The patients have not been physically examined, or presented with their treatment options.  Therefore, final treatment plans cannot be decided.  

## 2021-03-26 ENCOUNTER — Other Ambulatory Visit: Payer: Self-pay | Admitting: Hematology

## 2021-03-26 ENCOUNTER — Other Ambulatory Visit: Payer: Self-pay

## 2021-03-26 DIAGNOSIS — C210 Malignant neoplasm of anus, unspecified: Secondary | ICD-10-CM

## 2021-03-26 DIAGNOSIS — E119 Type 2 diabetes mellitus without complications: Secondary | ICD-10-CM | POA: Diagnosis not present

## 2021-03-26 DIAGNOSIS — R0781 Pleurodynia: Secondary | ICD-10-CM | POA: Diagnosis not present

## 2021-03-26 DIAGNOSIS — C774 Secondary and unspecified malignant neoplasm of inguinal and lower limb lymph nodes: Secondary | ICD-10-CM | POA: Diagnosis not present

## 2021-03-26 DIAGNOSIS — Z510 Encounter for antineoplastic radiation therapy: Secondary | ICD-10-CM | POA: Diagnosis not present

## 2021-03-26 DIAGNOSIS — I10 Essential (primary) hypertension: Secondary | ICD-10-CM | POA: Diagnosis not present

## 2021-03-26 DIAGNOSIS — Z7901 Long term (current) use of anticoagulants: Secondary | ICD-10-CM | POA: Diagnosis not present

## 2021-03-26 DIAGNOSIS — R197 Diarrhea, unspecified: Secondary | ICD-10-CM | POA: Diagnosis not present

## 2021-03-26 DIAGNOSIS — I4891 Unspecified atrial fibrillation: Secondary | ICD-10-CM | POA: Diagnosis not present

## 2021-03-26 DIAGNOSIS — I2510 Atherosclerotic heart disease of native coronary artery without angina pectoris: Secondary | ICD-10-CM | POA: Diagnosis not present

## 2021-03-26 DIAGNOSIS — Z923 Personal history of irradiation: Secondary | ICD-10-CM | POA: Diagnosis not present

## 2021-03-26 DIAGNOSIS — E785 Hyperlipidemia, unspecified: Secondary | ICD-10-CM | POA: Diagnosis not present

## 2021-03-26 DIAGNOSIS — Z5111 Encounter for antineoplastic chemotherapy: Secondary | ICD-10-CM | POA: Diagnosis not present

## 2021-03-26 DIAGNOSIS — R3915 Urgency of urination: Secondary | ICD-10-CM | POA: Diagnosis not present

## 2021-03-26 DIAGNOSIS — R519 Headache, unspecified: Secondary | ICD-10-CM | POA: Diagnosis not present

## 2021-03-26 DIAGNOSIS — Z79899 Other long term (current) drug therapy: Secondary | ICD-10-CM | POA: Diagnosis not present

## 2021-03-26 MED ORDER — ONDANSETRON HCL 4 MG PO TABS: 4.0000 mg | ORAL_TABLET | Freq: Three times a day (TID) | ORAL | 1 refills | Status: DC | PRN

## 2021-03-26 MED ORDER — PROCHLORPERAZINE MALEATE 10 MG PO TABS: 10.0000 mg | ORAL_TABLET | Freq: Four times a day (QID) | ORAL | 1 refills | Status: DC | PRN

## 2021-03-27 ENCOUNTER — Inpatient Hospital Stay: Payer: Medicare Other

## 2021-03-27 ENCOUNTER — Other Ambulatory Visit: Payer: Self-pay

## 2021-03-27 DIAGNOSIS — E119 Type 2 diabetes mellitus without complications: Secondary | ICD-10-CM | POA: Insufficient documentation

## 2021-03-27 DIAGNOSIS — R519 Headache, unspecified: Secondary | ICD-10-CM | POA: Insufficient documentation

## 2021-03-27 DIAGNOSIS — C210 Malignant neoplasm of anus, unspecified: Secondary | ICD-10-CM

## 2021-03-27 DIAGNOSIS — R0781 Pleurodynia: Secondary | ICD-10-CM | POA: Insufficient documentation

## 2021-03-27 DIAGNOSIS — Z5111 Encounter for antineoplastic chemotherapy: Secondary | ICD-10-CM | POA: Insufficient documentation

## 2021-03-27 DIAGNOSIS — Z79899 Other long term (current) drug therapy: Secondary | ICD-10-CM | POA: Insufficient documentation

## 2021-03-27 DIAGNOSIS — I10 Essential (primary) hypertension: Secondary | ICD-10-CM | POA: Insufficient documentation

## 2021-03-27 DIAGNOSIS — I4891 Unspecified atrial fibrillation: Secondary | ICD-10-CM | POA: Insufficient documentation

## 2021-03-27 DIAGNOSIS — E785 Hyperlipidemia, unspecified: Secondary | ICD-10-CM | POA: Insufficient documentation

## 2021-03-27 DIAGNOSIS — Z7901 Long term (current) use of anticoagulants: Secondary | ICD-10-CM | POA: Insufficient documentation

## 2021-03-27 DIAGNOSIS — Z923 Personal history of irradiation: Secondary | ICD-10-CM | POA: Insufficient documentation

## 2021-03-29 NOTE — Progress Notes (Signed)
[place] OFFICE PROGRESS NOTE  [name], MD [place] [id number]  DIAGNOSIS: Anal Cancer  Oncology History  Metastatic squamous cell carcinoma involving lymph node with unknown primary site [facility])  02/06/2018 Imaging   Impression CT abdomen and pelvis 1.  Enlarged left groin lymph node which is of uncertain etiology. This would be amenable to percutaneous sampling if deemed clinically appropriate. 2.  Hepatic steatosis. Focal area of low-attenuation in the far lateral left hepatic lobe, not clearly seen on the prior exam. Consider liver ultrasound for further evaluation.  3.  Mild L1 compression deformity which is new.   02/28/2018 Pathology Results   Left groin lymph node, needle core biopsy:       -Metastatic squamous cell carcinoma, see comment  P16 stain is strongly positive, suggestive of cervical or anogenital origin in this location. Immunohistochemical stains for CK5, p63, and pan cytokeratin are positive. ER is weakly positive. CK7 and CK20 are negative.   03/15/2018 Pathology Results   1. Cervix, biopsy, 12 o'clock - BENIGN SQUAMOUS MUCOSA WITH INFLAMMATION. - NO DYSPLASIA OR MALIGNANCY. 2. Endocervix, curettage - BENIGN SQUAMOUS EPITHELIUM.   03/23/2018 PET scan   1. Isolated intensely hypermetabolic enlarged LEFT inguinal lymph node. No clear primary identified. 2. No abnormal activity associated with the vagina or anorectal tissue. 3. No hypermetabolic lymph nodes in the pelvis. 4. Two adjacent nodules in the RIGHT upper lobe with very low metabolic activity. These are not favored primary malignancies. Recommend follow-up per Fleischner criteria. Non-contrast chest CT at 6-12 months is recommended.    07/05/2018 - 07/18/2018 Radiation Therapy   Left inguinal area/nodes treated to 30 Gy in 10 fractions   09/22/2020 Imaging   EXAM: CT ABDOMEN PELVIS W IV CONTRAST   IMPRESSION:  1. Multicystic enhancing right adnexal  lesion with mild surrounding stranding. Given short interval since recent CT 3 weeks prior, this is more suspicious for acute findings such as torsion or infection, less likely neoplasm. Recommend endovaginal ultrasound for further evaluation and follow-up to resolution.  2. Other stable chronic findings.    01/30/2021 Pathology Results   DIAGNOSIS   SMALL  TUFT OF TISSUE ABOVE ANAL VERGE, ENDOSCOPIC BIOPSIES:   - HIGH-GRADE SQUAMOUS INTRAEPITHELIAL LESION/ANAL INTRAEPITHELIAL NEOPLASIA 2-3 OUT OF 3 (HGSIL/AIN 2-3).   - SEE COMMENT.  ([id number])   COMMENT  MORPHOLOGICALLY THERE IS AT LEAST TWO THIRDS THICKNESS INVOLVEMENT BY DYSPLASTIC EPITHELIUM WITH MID-LEVEL MITOSES SECONDARY HOWEVER DUE TO POOR ORIENTATION AND MORPHOLOGIC ASSESSMENT IS 2 SERVICE MUCOSA IS ONLY PRESENT IN FOCAL AREAS MORPHOLOGICALLY THIS REPRESENTS AT LEAST AIN 2 AND MOST LIKELY AIN 3.  IN EITHER RESPECT THE FINDINGS ARE CONSISTENT WITH HIGH-GRADE SQUAMOUS INTRAEPITHELIAL LESION WHICH IS ALSO CONFIRMED BY STRONGLY BLOCK LIKE P16 IMMUNOHISTOCHEMICAL  POSITIVITY.    03/05/2021 Pathology Results   Final Diagnosis    Rectal mass, biopsy: -Invasive squamous cell carcinoma, moderately differentiated   Addendum 1    The carcinoma was analyzed by [facility] for DNA mismatch repair proteins.  The neoplasm retained nuclear expression of all four mismatch repair proteins, MLH1, PMS2, MSH2, MSH6.  Controls worked appropriately.     Anal cancer ([place])  01/30/2021 Cancer Staging   Staging form: Anus, AJCC V9 - Clinical stage from 01/30/2021: Stage I (cT1, cN0, cM0) - Signed by Truitt Merle, MD on 03/11/2021 Stage prefix: Initial diagnosis Histologic grade (G): G2 Histologic grading system: 4 grade system    03/11/2021 Initial Diagnosis   Anal cancer ([place])  03/30/2021 -  Chemotherapy   Patient is on Treatment Plan : ANUS Mitomycin D1,28 / 5FU D1-4, 28-31 q32d       CURRENT THERAPY: Today is day 1 cycle #1 of concurrent chemoradiation with  5-FU and mitomycin  INTERVAL HISTORY: Lorraine Gilbert 88 y.o. female returns to the clinic today for a follow up visit  accompanied by her daughter in law. The patient initially was seen for groin adenopathy in 2020. A biopsy was performed which showed metastatic squamous cell carcinoma. She received radiation under the care of Dr. Sondra Come.   More recently, the patient presented with constipation. Her exam showed a questionable adenopathy. She saw GI for sigmoidoscopy. This was performed on 01/30/21 showing only a "tuft" of tissue above the anal verge. Pathology confirmed HGSIL/AIL. She was referred to Dr. Hilliard Clark, who recommended additional biopsy. She was taken to the OR for anorectal exam with biopsy under anesthesia on 03/05/21. Pathology revealed invasive squamous cell carcinoma, moderately differentiated. She had some complications with rectal bleeding after she restarted her anti-coagulant.   the patient agreed to pursue treatment consisting of concurrent chemoradiation. Given her advanced age,  Dr. Burr Medico also noted that she will reduce her chemo dosage by 25%. The patient met with Dr. Sondra Come and is expected to start her treatment today.   She denies any pelvic pain at this time. She has a chronic cough for which she previously saw allergy for post nasal drainage. Her PCP also prescribed tessalon pearles.She is scheduled to see her PCP tomorrow. She also has diabetes and she has hypertension. She does not take any anti-hypertensive. She states she checks her BP at home and she typically has systolic or diastolic 027-741'O. She has a mild headache today. She also has right lateral rib pain. She states it has been occurring for a few weeks. Denies falls or injuries. She denies anything that exacerbates her pain except laying on her right side. She has a history of osteoporosis and the chronic cough as previously mentioned. No overlying skin changes. No nausea, vomiting, changes in urination, or hematuria.  She rates her pain a 4/10. She uses tylenol which resolves the pain. She denies any further vaginal bleeding since that one episode after her biopsy. She denies fevers, chills, night sweats, or weight loss. Her appetite is "so-so".  Denies any rectal bleeding. Sometimes her rectum itches.    MEDICAL HISTORY: Past Medical History:  Diagnosis Date   A-fib ([place])    Abnormal heart rhythm    Allergic rhinitis    Anxiety    Asthma    Diabetes mellitus without complication (HCC)    Type II   GERD (gastroesophageal reflux disease)    Headache    History of radiation therapy 07/05/2018-07/18/2018   left pelvis        Dr Gery Pray   Hypertension    Neuromuscular disorder ([place])    Siactic pain in right leg   Osteopenia    Rheumatoid arthritis ([place])    Rotator cuff tear arthropathy    Right   Vertigo     ALLERGIES:  is allergic to ibuprofen.  MEDICATIONS:  Current Outpatient Medications  Medication Sig Dispense Refill   ACCU-CHEK AVIVA PLUS test strip 1 each as directed.     Accu-Chek Softclix Lancets lancets 1 each by Other route as directed.     ACETAMINOPHEN 8 HOUR PO Take 1 tablet by mouth as needed.     albuterol (PROVENTIL) (2.5 MG/3ML) 0.083% nebulizer solution SMARTSIG:1 Vial(s) Via  Nebulizer Every 4-6 Hours PRN     Alcohol Swabs (ALCOHOL WIPES) 70 % PADS DropSafe Alcohol Prep Pads     amiodarone (PACERONE) 100 MG tablet Take 100 mg by mouth daily.     Blood Glucose Calibration (ACCU-CHEK AVIVA) SOLN      clopidogrel (PLAVIX) 75 MG tablet Take 75 mg by mouth daily.     diclofenac Sodium (VOLTAREN) 1 % GEL Apply topically.     ELIQUIS 2.5 MG TABS tablet Take 2.5 mg by mouth 2 (two) times daily.     escitalopram (LEXAPRO) 10 MG tablet Take 10 mg by mouth daily.     folic acid (FOLVITE) 1 MG tablet Take by mouth.     polyethylene glycol powder (GLYCOLAX/MIRALAX) 17 GM/SCOOP powder Take by mouth.     rosuvastatin (CRESTOR) 5 MG tablet Take by mouth.     TRELEGY ELLIPTA  200-62.5-25 MCG/ACT AEPB Take 1 puff by mouth daily.     Vitamin D, Ergocalciferol, 50000 units CAPS 1 capsule     levalbuterol (XOPENEX) 1.25 MG/3ML nebulizer solution Inhale into the lungs.     nitroGLYCERIN (NITROSTAT) 0.4 MG SL tablet Place 0.4 mg under the tongue every 5 (five) minutes as needed for chest pain. (Patient not taking: Reported on 03/30/2021)     ondansetron (ZOFRAN) 4 MG tablet Take 1 tablet (4 mg total) by mouth every 8 (eight) hours as needed for nausea or vomiting. (Patient not taking: Reported on 03/30/2021) 20 tablet 1   prochlorperazine (COMPAZINE) 10 MG tablet Take 1 tablet (10 mg total) by mouth every 6 (six) hours as needed for nausea or vomiting. (Patient not taking: Reported on 03/30/2021) 30 tablet 1   No current facility-administered medications for this visit.   Facility-Administered Medications Ordered in Other Visits  Medication Dose Route Frequency Provider Last Rate Last Admin   acetaminophen (TYLENOL) tablet 650 mg  650 mg Oral Once Vere Diantonio L, PA-C       fluorouracil (ADRUCIL) 5,350 mg in sodium chloride 0.9 % 143 mL chemo infusion  800 mg/m2/day (Treatment Plan Recorded) Intravenous 4 days Truitt Merle, MD       lidocaine (PF) (XYLOCAINE) 1 % injection    PRN Suttle, Rosanne Ashing, MD   2 mL at 03/30/21 0832   lidocaine (XYLOCAINE) 1 % (with pres) injection            mitoMYcin (MUTAMYCIN) chemo injection 13.5 mg  8 mg/m2 (Treatment Plan Recorded) Intravenous Once Truitt Merle, MD       prochlorperazine (COMPAZINE) tablet 10 mg  10 mg Oral Once Truitt Merle, MD        SURGICAL HISTORY:  Past Surgical History:  Procedure Laterality Date   BIOPSY  12/13/2018   Procedure: BIOPSY;  Surgeon: Wonda Horner, MD;  Location: [facility];  Service: Endoscopy;;   CATARACT EXTRACTION     bilateral   COLONOSCOPY WITH PROPOFOL N/A 12/13/2018   Procedure: COLONOSCOPY WITH PROPOFOL;  Surgeon: Wonda Horner, MD;  Location: [facility];  Service: Endoscopy;   Laterality: N/A;   ESOPHAGOGASTRODUODENOSCOPY N/A 12/13/2018   Procedure: ESOPHAGOGASTRODUODENOSCOPY (EGD);  Surgeon: Wonda Horner, MD;  Location: [facility];  Service: Endoscopy;  Laterality: N/A;   EYE SURGERY     POLYPECTOMY  12/13/2018   Procedure: POLYPECTOMY;  Surgeon: Wonda Horner, MD;  Location: [facility];  Service: Endoscopy;;   REVERSE SHOULDER ARTHROPLASTY Right 08/04/2017   REVERSE SHOULDER ARTHROPLASTY Right 08/04/2017   Procedure: RIGHT REVERSE SHOULDER ARTHROPLASTY;  Surgeon:  Justice Britain, MD;  Location: [facility];  Service: Orthopedics;  Laterality: Right;   TUBAL LIGATION      REVIEW OF SYSTEMS:   Review of Systems  Constitutional: Negative for appetite change, chills, fatigue, fever and unexpected weight change.  HENT: positive for post nasal drainage. Negative for mouth sores, nosebleeds, sore throat and trouble swallowing.   Eyes: Negative for eye problems and icterus.  Respiratory: Positive for chronic cough due to post nasal drainage. Negative for hemoptysis, shortness of breath and wheezing.   Cardiovascular: Negative for chest pain and leg swelling.  Gastrointestinal: Negative for abdominal pain, constipation, diarrhea, nausea and vomiting.  Genitourinary: Negative for bladder incontinence, difficulty urinating, dysuria, frequency and hematuria.   Musculoskeletal: Negative for back pain, gait problem, neck pain and neck stiffness.  Skin: Negative for itching and rash.  Neurological: Positive for mild headache. Negative for dizziness, extremity weakness, gait problem, light-headedness and seizures.  Hematological: Negative for adenopathy. Does not bruise/bleed easily.  Psychiatric/Behavioral: Negative for confusion, depression and sleep disturbance. The patient is not nervous/anxious.     PHYSICAL EXAMINATION:  Blood pressure (!) 168/55, pulse 64, temperature 97.8 F (36.6 C), temperature source Tympanic, resp. rate 16, height 5' 2" (1.575 m), weight 137 lb  4.8 oz (62.3 kg), SpO2 100 %.  ECOG PERFORMANCE STATUS: 1  Physical Exam  Constitutional: Oriented to person, place, and time and well-developed, well-nourished, and in no distress. HENT:  Head: Normocephalic and atraumatic.  Mouth/Throat: Oropharynx is clear and moist. No oropharyngeal exudate.  Eyes: Conjunctivae are normal. Right eye exhibits no discharge. Left eye exhibits no discharge. No scleral icterus.  Neck: Normal range of motion. Neck supple.  Cardiovascular: Normal rate, regular rhythm, normal heart sounds and intact distal pulses.   Pulmonary/Chest: Effort normal and breath sounds normal. No respiratory distress. No wheezes. No rales.  Abdominal: Soft. Bowel sounds are normal. Exhibits no distension and no mass. There is no tenderness.  Musculoskeletal: Pain to palpation over lower right rib. Normal range of motion. Exhibits no edema.  Lymphadenopathy:    No cervical adenopathy.  Neurological: Alert and oriented to person, place, and time. Exhibits muscle wasting. Gait normal. Coordination normal.  Skin: Skin is warm and dry. No rash noted. Not diaphoretic. No erythema. No pallor.  Psychiatric: Mood, memory and judgment normal.  Vitals reviewed.  LABORATORY DATA: Lab Results  Component Value Date   WBC 6.5 03/30/2021   HGB 12.0 03/30/2021   HCT 37.1 03/30/2021   MCV 90.9 03/30/2021   PLT 258 03/30/2021      Chemistry      Component Value Date/Time   NA 139 03/30/2021 1036   K 4.3 03/30/2021 1036   CL 107 03/30/2021 1036   CO2 27 03/30/2021 1036   BUN 14 03/30/2021 1036   CREATININE 0.71 03/30/2021 1036      Component Value Date/Time   CALCIUM 8.9 03/30/2021 1036   ALKPHOS 69 03/30/2021 1036   AST 24 03/30/2021 1036   ALT 22 03/30/2021 1036   BILITOT 0.4 03/30/2021 1036       RADIOGRAPHIC STUDIES:  NM PET Image Restag (PS) Skull Base To Thigh  Result Date: 03/13/2021 CLINICAL DATA:  Subsequent treatment strategy for squamous cell carcinoma the anus.  Prior LEFT groin carcinoma. EXAM: NUCLEAR MEDICINE PET SKULL BASE TO THIGH TECHNIQUE: 6.8 mCi F-18 FDG was injected intravenously. Full-ring PET imaging was performed from the skull base to thigh after the radiotracer. CT data was obtained and used for attenuation correction and  anatomic localization. Fasting blood glucose: 99 mg/dl COMPARISON:  None. FINDINGS: Mediastinal blood pool activity: SUV max 3.0 Liver activity: SUV max NA NECK: No hypermetabolic lymph nodes in the neck. Incidental CT findings: none CHEST: No hypermetabolic mediastinal or hilar nodes. No suspicious pulmonary nodules on the CT scan. Incidental CT findings: Coronary artery calcification and aortic atherosclerotic calcification. ABDOMEN/PELVIS: Intense radiotracer activity through the distal rectum anal canal with SUV max equal 14.0 (image 182). No discrete lesion on noncontrast CT. No hypermetabolic lymph nodes in the pelvis. No hypermetabolic inguinal lymph nodes. No periaortic hypermetabolic nodes No abnormal metabolic activity liver. Incidental CT findings: Atherosclerotic calcification of the aorta. SKELETON: No focal hypermetabolic activity to suggest skeletal metastasis. Incidental CT findings: none IMPRESSION: 1. Intense metabolic activity through the anal canal would be consistent patient's history of anal carcinoma. 2. No metastatic adenopathy in the pelvis or inguinal nodal stations. 3. No distant metastatic adenopathy, visceral metastasis, or skeletal metastasis Electronically Signed   By: Suzy Bouchard M.D.   On: 03/13/2021 11:06   IR PICC PLACEMENT LEFT >5 YRS INC IMG GUIDE  Result Date: 03/30/2021 INDICATION: 88 year old female with history of anal cancer requiring central venous access for chemotherapy administration. EXAM: ULTRASOUND AND FLUOROSCOPIC GUIDED PICC LINE INSERTION MEDICATIONS: None. CONTRAST:  None FLUOROSCOPY TIME:  Six seconds (1 mGy) COMPLICATIONS: None immediate. TECHNIQUE: The procedure, risks,  benefits, and alternatives were explained to the patient and informed written consent was obtained. A timeout was performed prior to the initiation of the procedure. The right upper extremity was prepped with chlorhexidine in a sterile fashion, and a sterile drape was applied covering the operative field. Maximum barrier sterile technique with sterile gowns and gloves were used for the procedure. A timeout was performed prior to the initiation of the procedure. Local anesthesia was provided with 1% lidocaine. Under direct ultrasound guidance, the basilic vein was accessed with a micropuncture kit after the overlying soft tissues were anesthetized with 1% lidocaine. After the overlying soft tissues were anesthetized, a small venotomy incision was created and a micropuncture kit was utilized to access the right basilic vein. Real-time ultrasound guidance was utilized for vascular access including the acquisition of a permanent ultrasound image documenting patency of the accessed vessel. A guidewire was advanced to the level of the superior caval-atrial junction for measurement purposes and the PICC line was cut to length. A peel-away sheath was placed and a 37 cm, 5 [place], single lumen was inserted to level of the superior caval-atrial junction. A post procedure spot fluoroscopic was obtained. The catheter easily aspirated and flushed and was secured in place with stat lock device. A dressing was applied. The patient tolerated the procedure well without immediate post procedural complication. FINDINGS: After catheter placement, the tip lies within the superior cavoatrial junction. The catheter aspirates and flushes normally and is ready for immediate use. IMPRESSION: Successful ultrasound and fluoroscopic guided placement of a right basilic vein approach, 37 cm, 5 French, single lumen PICC with tip at the superior caval-atrial junction. The PICC line is ready for immediate use. Ruthann Cancer, MD [facility] Radiology Electronically Signed   By: Ruthann Cancer M.D.   On: 03/30/2021 08:44     ASSESSMENT/PLAN:  Jeanita Carneiro is a 88 y.o.  female with a history of SCC in left groin with unknown primary in 01/2020    1. Anal Squamous Cell Carcinoma, cT1N0M0 -initially presented with palpable left groin lymph node in 01/2018. Biopsy on 02/27/18 showed  metastatic squamous cell carcinoma, unknown primary. PET scan 03/23/18 also didn't show a primary.colonoscopy and GYN work up was negative for malignancy  -she was treated with radiation therapy under Dr. Sondra Come 6/17-6/30/20. -she presented back with constipation in 09/2020. Rectal exam showed a questionable abnormality. Sigmoidoscopy on 01/30/21 with Dr. Percell Miller showed a small tuft of tissue above the anal verge. Pathology showed high-grade squamous intraepithelial lesion/anal intraepithelial neoplasia. -she was referred to Dr. Hilliard Clark and underwent anal biopsy in the OR on 03/05/21. Per OR note" I could see a white, ulcerated lesion in the left posterolateral anal canal extending from anal verge to roughly 1.5cm proximal. It was fixed and bled easily). Pathology from rectal mass confirmed invasive squamous cell carcinoma, moderately differentiated, MMR normal. -she subsequently experienced rectal bleeding, requiring brief hospitalization. The bleeding has stopped since she held eliquis  -Dr. Burr Medico previously discussed the treatment for early stage anal cancer is concurrent chemoradiation with 5-FU and mitomycin. She is 43, but overall healthy and fit, will still be a candidate for chemotherapy.  -The patient does not have any questions and is ready to start treatment. I reviewed her schedule with her.  -The goal of therapy is curative -Due to her advanced age, Dr. Burr Medico has reduce her chemo dose by 25%. -She is scheduled to see Dr. Sondra Come today to start.  -Labs were reviewed. Recommend that she proceed with day 1 cycle #1  today as scheduled.  -F/U on 04/10/21   2.  Hypertension, hyperlipidemia, atrial fibrillation, DM, chronic cough due to post nasal drainage (not on meds) -f/u with PCP and will monitor her close on chemoRT -Blood pressure rechecked and is 168/55 -She states her BP is typically 283-662'H systolic. I discussed that she should bring this to her PCP's attention when she sees him tomorrow. Her BP is not at goal. She checks her BP every day at home and keeps a log of her readings. I advised her to bring this log to her appointment tomorrow.  -I do not want to give her clonidine today as that may make her drowsy with her advanced age.  -She also has a chronic cough due to post nasal drainage. She previously saw an allergist. I advised her to discuss this with her PCP tomorrow. We are arranging for CXR today (see below) and can ensure no pneumonia.   3. Right lower rib pain -Patient reports 4/10 rib pain on lower lateral ribs. This is tender to palpation No overlying skin changes. She denies falls.  -We will obtain CXR and rib XR to rule out fracture.  -Advised to use tylenol, heating pads, and salonpas patches if needed.  -Will give tylenol for her rib pain and headache today in the infusion room.  -PET scan from 03/13/21 did not show any metastatic disease   PLAN:  -Proceed with cycle #1 today. She is on a 20% reduced dose due to age.  -F/U on 04/10/21 as scheduled -CXR and rib XR today.  -Tylenol for rib pain in infusion room.    Orders Placed This Encounter  Procedures   DG Chest 2 View    Standing Status:   Future    Standing Expiration Date:   03/30/2022    Order Specific Question:   Reason for Exam (SYMPTOM  OR DIAGNOSIS REQUIRED)    Answer:   Chronic cough, right rib pain. Please ensure no fracture or other abnormalities.    Order Specific Question:   Preferred imaging location?    Answer:  [facility]   DG Ribs Unilateral Right    Standing Status:   Future    Standing  Expiration Date:   03/31/2022    Order Specific Question:   Reason for Exam (SYMPTOM  OR DIAGNOSIS REQUIRED)    Answer:   right rib pain.    Order Specific Question:   Preferred imaging location?    Answer:   [facility]     The total time spent in the appointment was 20-29 minutes.    L , PA-C 03/30/21

## 2021-03-30 ENCOUNTER — Ambulatory Visit (HOSPITAL_COMMUNITY)
Admission: RE | Admit: 2021-03-30 | Discharge: 2021-03-30 | Disposition: A | Discharge status: Home / Self Care | Payer: Medicare Other | Source: Ambulatory Visit | Attending: Hematology | Admitting: Hematology

## 2021-03-30 ENCOUNTER — Other Ambulatory Visit: Payer: Self-pay | Admitting: Hematology

## 2021-03-30 ENCOUNTER — Inpatient Hospital Stay (HOSPITAL_BASED_OUTPATIENT_CLINIC_OR_DEPARTMENT_OTHER): Payer: Medicare Other | Admitting: Physician Assistant

## 2021-03-30 ENCOUNTER — Inpatient Hospital Stay: Payer: Medicare Other

## 2021-03-30 ENCOUNTER — Other Ambulatory Visit: Payer: Self-pay

## 2021-03-30 ENCOUNTER — Ambulatory Visit
Admission: RE | Admit: 2021-03-30 | Discharge: 2021-03-30 | Disposition: A | Discharge status: Home / Self Care | Payer: Medicare Other | Source: Ambulatory Visit | Attending: Radiation Oncology | Admitting: Radiation Oncology

## 2021-03-30 ENCOUNTER — Other Ambulatory Visit: Payer: Self-pay | Admitting: *Deleted

## 2021-03-30 VITALS — BP 168/55 | HR 64 | Temp 97.8°F | Resp 16 | Ht 62.0 in | Wt 137.3 lb

## 2021-03-30 VITALS — BP 168/55 | HR 64

## 2021-03-30 DIAGNOSIS — Z452 Encounter for adjustment and management of vascular access device: Secondary | ICD-10-CM | POA: Diagnosis not present

## 2021-03-30 DIAGNOSIS — Z79899 Other long term (current) drug therapy: Secondary | ICD-10-CM | POA: Diagnosis not present

## 2021-03-30 DIAGNOSIS — C210 Malignant neoplasm of anus, unspecified: Secondary | ICD-10-CM

## 2021-03-30 DIAGNOSIS — Z923 Personal history of irradiation: Secondary | ICD-10-CM | POA: Diagnosis not present

## 2021-03-30 DIAGNOSIS — I4891 Unspecified atrial fibrillation: Secondary | ICD-10-CM | POA: Diagnosis not present

## 2021-03-30 DIAGNOSIS — R197 Diarrhea, unspecified: Secondary | ICD-10-CM | POA: Diagnosis not present

## 2021-03-30 DIAGNOSIS — R0789 Other chest pain: Secondary | ICD-10-CM

## 2021-03-30 DIAGNOSIS — Z7901 Long term (current) use of anticoagulants: Secondary | ICD-10-CM | POA: Diagnosis not present

## 2021-03-30 DIAGNOSIS — C774 Secondary and unspecified malignant neoplasm of inguinal and lower limb lymph nodes: Secondary | ICD-10-CM | POA: Diagnosis not present

## 2021-03-30 DIAGNOSIS — I10 Essential (primary) hypertension: Secondary | ICD-10-CM

## 2021-03-30 DIAGNOSIS — R519 Headache, unspecified: Secondary | ICD-10-CM | POA: Diagnosis not present

## 2021-03-30 DIAGNOSIS — R0781 Pleurodynia: Secondary | ICD-10-CM | POA: Insufficient documentation

## 2021-03-30 DIAGNOSIS — E785 Hyperlipidemia, unspecified: Secondary | ICD-10-CM | POA: Diagnosis not present

## 2021-03-30 DIAGNOSIS — R3915 Urgency of urination: Secondary | ICD-10-CM | POA: Diagnosis not present

## 2021-03-30 DIAGNOSIS — Z510 Encounter for antineoplastic radiation therapy: Secondary | ICD-10-CM | POA: Diagnosis not present

## 2021-03-30 DIAGNOSIS — Z5111 Encounter for antineoplastic chemotherapy: Secondary | ICD-10-CM | POA: Diagnosis not present

## 2021-03-30 DIAGNOSIS — E119 Type 2 diabetes mellitus without complications: Secondary | ICD-10-CM | POA: Diagnosis not present

## 2021-03-30 LAB — CMP (CANCER CENTER ONLY)
ALT: 22 U/L (ref 0–44)
AST: 24 U/L (ref 15–41)
Albumin: 3.9 g/dL (ref 3.5–5.0)
Alkaline Phosphatase: 69 U/L (ref 38–126)
Anion gap: 5 (ref 5–15)
BUN: 14 mg/dL (ref 8–23)
CO2: 27 mmol/L (ref 22–32)
Calcium: 8.9 mg/dL (ref 8.9–10.3)
Chloride: 107 mmol/L (ref 98–111)
Creatinine: 0.71 mg/dL (ref 0.44–1.00)
GFR, Estimated: >60 mL/min (ref >60)
Glucose, Bld: 109 mg/dL — ABNORMAL HIGH (ref 70–99)
Potassium: 4.3 mmol/L (ref 3.5–5.1)
Sodium: 139 mmol/L (ref 135–145)
Total Bilirubin: 0.4 mg/dL (ref 0.3–1.2)
Total Protein: 6.8 g/dL (ref 6.5–8.1)

## 2021-03-30 LAB — CBC WITH DIFFERENTIAL (CANCER CENTER ONLY)
Abs Immature Granulocytes: 0.06 10*3/uL (ref 0.00–0.07)
Basophils Absolute: 0 10*3/uL (ref 0.0–0.1)
Basophils Relative: 0 %
Eosinophils Absolute: 0.2 10*3/uL (ref 0.0–0.5)
Eosinophils Relative: 3 %
HCT: 37.1 % (ref 36.0–46.0)
Hemoglobin: 12 g/dL (ref 12.0–15.0)
Immature Granulocytes: 1 %
Lymphocytes Relative: 31 %
Lymphs Abs: 2 10*3/uL (ref 0.7–4.0)
MCH: 29.4 pg (ref 26.0–34.0)
MCHC: 32.3 g/dL (ref 30.0–36.0)
MCV: 90.9 fL (ref 80.0–100.0)
Monocytes Absolute: 0.6 10*3/uL (ref 0.1–1.0)
Monocytes Relative: 9 %
Neutro Abs: 3.6 10*3/uL (ref 1.7–7.7)
Neutrophils Relative %: 56 %
Platelet Count: 258 10*3/uL (ref 150–400)
RBC: 4.08 MIL/uL (ref 3.87–5.11)
RDW: 13.7 % (ref 11.5–15.5)
WBC Count: 6.5 10*3/uL (ref 4.0–10.5)
nRBC: 0 % (ref 0.0–0.2)

## 2021-03-30 MED ORDER — SODIUM CHLORIDE 0.9% FLUSH
10.0000 mL | INTRAVENOUS | Status: DC | PRN
  Administered 2021-03-30: 10 mL via INTRAVENOUS

## 2021-03-30 MED ORDER — SODIUM CHLORIDE 0.9 % IV SOLN
800.0000 mg/m2/d | INTRAVENOUS | Status: DC
  Administered 2021-03-30: 5350 mg via INTRAVENOUS
  Filled 2021-03-30: qty 107

## 2021-03-30 MED ORDER — ACETAMINOPHEN 325 MG PO TABS
650.0000 mg | ORAL_TABLET | Freq: Once | ORAL | Status: AC
  Administered 2021-03-30: 650 mg via ORAL
  Filled 2021-03-30: qty 2

## 2021-03-30 MED ORDER — PROCHLORPERAZINE MALEATE 10 MG PO TABS
10.0000 mg | ORAL_TABLET | Freq: Once | ORAL | Status: AC
  Administered 2021-03-30: 10 mg via ORAL
  Filled 2021-03-30: qty 1

## 2021-03-30 MED ORDER — LIDOCAINE HCL (PF) 1 % IJ SOLN
INTRAMUSCULAR | Status: DC | PRN
  Administered 2021-03-30: 2 mL

## 2021-03-30 MED ORDER — HEPARIN SOD (PORK) LOCK FLUSH 100 UNIT/ML IV SOLN
INTRAVENOUS | Status: AC
  Filled 2021-03-30: qty 5

## 2021-03-30 MED ORDER — SODIUM CHLORIDE 0.9 % IV SOLN: Freq: Once | INTRAVENOUS | Status: AC

## 2021-03-30 MED ORDER — LIDOCAINE HCL 1 % IJ SOLN
INTRAMUSCULAR | Status: AC
  Filled 2021-03-30: qty 20

## 2021-03-30 MED ORDER — MITOMYCIN CHEMO IV INJECTION 20 MG
8.0000 mg/m2 | Freq: Once | INTRAVENOUS | Status: AC
  Administered 2021-03-30: 13.5 mg via INTRAVENOUS
  Filled 2021-03-30: qty 27

## 2021-03-30 NOTE — Patient Instructions (Signed)
[facility]   Discharge Instructions: Thank you for choosing [facility] to provide your oncology and hematology care.   If you have a lab appointment with the [facility], please go directly to the [facility] and check in at the registration area.   Wear comfortable clothing and clothing appropriate for easy access to any Portacath or PICC line.   We strive to give you quality time with your provider. You may need to reschedule your appointment if you arrive late (15 or more minutes).  Arriving late affects you and other patients whose appointments are after yours.  Also, if you miss three or more appointments without notifying the office, you may be dismissed from the clinic at the providers discretion.      For prescription refill requests, have your pharmacy contact our office and allow 72 hours for refills to be completed.    Today you received the following chemotherapy and/or immunotherapy agents: mitomycin and fluorouracil      To help prevent nausea and vomiting after your treatment, we encourage you to take your nausea medication as directed.  BELOW ARE SYMPTOMS THAT SHOULD BE REPORTED IMMEDIATELY: *FEVER GREATER THAN 100.4 F (38 C) OR HIGHER *CHILLS OR SWEATING *NAUSEA AND VOMITING THAT IS NOT CONTROLLED WITH YOUR NAUSEA MEDICATION *UNUSUAL SHORTNESS OF BREATH *UNUSUAL BRUISING OR BLEEDING *URINARY PROBLEMS (pain or burning when urinating, or frequent urination) *BOWEL PROBLEMS (unusual diarrhea, constipation, pain near the anus) TENDERNESS IN MOUTH AND THROAT WITH OR WITHOUT PRESENCE OF ULCERS (sore throat, sores in mouth, or a toothache) UNUSUAL RASH, SWELLING OR PAIN  UNUSUAL VAGINAL DISCHARGE OR ITCHING   Items with * indicate a potential emergency and should be followed up as soon as possible or go to the Emergency Department if any problems should occur.  Please show the CHEMOTHERAPY ALERT CARD or IMMUNOTHERAPY ALERT  CARD at check-in to the Emergency Department and triage nurse.  Should you have questions after your visit or need to cancel or reschedule your appointment, please contact [facility]  Dept: [phone]  and follow the prompts.  Office hours are 8:00 a.m. to 4:30 p.m. Monday - Friday. Please note that voicemails left after 4:00 p.m. may not be returned until the following business day.  We are closed weekends and major holidays. You have access to a nurse at all times for urgent questions. Please call the main number to the clinic Dept: [phone] and follow the prompts.   For any non-urgent questions, you may also contact your provider using MyChart. We now offer e-Visits for anyone 35 and older to request care online for non-urgent symptoms. For details visit [email or web address].   Also download the MyChart app! Go to the app store, search "MyChart", open the app, select [facility], and log in with your MyChart username and password.  Due to Covid, a mask is required upon entering the hospital/clinic. If you do not have a mask, one will be given to you upon arrival. For doctor visits, patients may have 1 support person aged 5 or older with them. For treatment visits, patients cannot have anyone with them due to current Covid guidelines and our immunocompromised population.   Mitomycin injection What is this medication? MITOMYCIN (mye toe MYE sin) is a chemotherapy drug. This medicine is used to treat cancer of the stomach and pancreas. This medicine may be used for other purposes; ask your health care provider or pharmacist if you have questions.  COMMON BRAND NAME(S): Mutamycin What should I tell my care team before I take this medication? They need to know if you have any of these conditions: bleeding disorders infection (especially a viral infection such as chickenpox, cold sores, or herpes) low blood counts, like white cells, platelets, or red blood  cells kidney disease an unusual or allergic reaction to mitomycin, other medicines, foods, dyes, or preservatives pregnant or trying to get pregnant breast-feeding How should I use this medication? This drug is given as an injection or infusion into a vein. It is administered in a hospital or clinic by a specially trained health care professional. Talk to your pediatrician regarding the use of this medicine in children. Special care may be needed. Overdosage: If you think you have taken too much of this medicine contact a poison control center or emergency room at once. NOTE: This medicine is only for you. Do not share this medicine with others. What if I miss a dose? It is important not to miss your dose. Call your doctor or health care professional if you are unable to keep an appointment. What may interact with this medication? Interactions are not expected. This list may not describe all possible interactions. Give your health care provider a list of all the medicines, herbs, non-prescription drugs, or dietary supplements you use. Also tell them if you smoke, drink alcohol, or use illegal drugs. Some items may interact with your medicine. What should I watch for while using this medication? Your condition will be monitored carefully while you are receiving this medicine. You will need important blood work done while you are taking this medicine. This drug may make you feel generally unwell. This is not uncommon, as chemotherapy can affect healthy cells as well as cancer cells. Report any side effects. Continue your course of treatment even though you feel ill unless your doctor tells you to stop. Call your doctor or health care professional for advice if you get a fever, chills or sore throat, or other symptoms of a cold or flu. Do not treat yourself. This drug decreases your body's ability to fight infections. Try to avoid being around people who are sick. This medicine may increase your risk  to bruise or bleed. Call your doctor or health care professional if you notice any unusual bleeding. Be careful brushing and flossing your teeth or using a toothpick because you may get an infection or bleed more easily. If you have any dental work done, tell your dentist you are receiving this medicine. Avoid taking products that contain aspirin, acetaminophen, ibuprofen, naproxen, or ketoprofen unless instructed by your doctor. These medicines may hide a fever. Do not become pregnant while taking this medicine. Women should inform their doctor if they wish to become pregnant or think they might be pregnant. There is a potential for serious side effects to an unborn child. Talk to your health care professional or pharmacist for more information. Do not breast-feed an infant while taking this medicine. What side effects may I notice from receiving this medication? Side effects that you should report to your doctor or health care professional as soon as possible: allergic reactions like skin rash, itching or hives, swelling of the face, lips, or tongue breathing problems pain, redness, or irritation at site where injected signs and symptoms of bleeding such as bloody or black, tarry stools; red or dark brown urine; spitting up blood or brown material that looks like coffee grounds; red spots on the skin; unusual bruising or bleeding  from the eyes, gums, or nose signs and symptoms of infection like fever; chills; cough; sore throat; pain or trouble passing urine signs and symptoms of kidney injury like trouble passing urine or change in the amount of urine signs and symptoms of low red blood cells or anemia such as unusually weak or tired; feeling faint or lightheaded; falls; breathing problems Side effects that usually do not require medical attention (report to your doctor or health care professional if they continue or are bothersome): green to blue color of urine hair loss loss of appetite mouth  sores nausea, vomiting This list may not describe all possible side effects. Call your doctor for medical advice about side effects. You may report side effects to FDA at 1-800-FDA-1088. Where should I keep my medication? This drug is given in a hospital or clinic and will not be stored at home. NOTE: This sheet is a summary. It may not cover all possible information. If you have questions about this medicine, talk to your doctor, pharmacist, or health care provider.  2022 Elsevier/Gold Standard (2018-09-12 00:00:00)  Fluorouracil, 5-FU injection What is this medication? FLUOROURACIL, 5-FU (flure oh YOOR a sil) is a chemotherapy drug. It slows the growth of cancer cells. This medicine is used to treat many types of cancer like breast cancer, colon or rectal cancer, pancreatic cancer, and stomach cancer. This medicine may be used for other purposes; ask your health care provider or pharmacist if you have questions. COMMON BRAND NAME(S): Adrucil What should I tell my care team before I take this medication? They need to know if you have any of these conditions: blood disorders dihydropyrimidine dehydrogenase (DPD) deficiency infection (especially a virus infection such as chickenpox, cold sores, or herpes) kidney disease liver disease malnourished, poor nutrition recent or ongoing radiation therapy an unusual or allergic reaction to fluorouracil, other chemotherapy, other medicines, foods, dyes, or preservatives pregnant or trying to get pregnant breast-feeding How should I use this medication? This drug is given as an infusion or injection into a vein. It is administered in a hospital or clinic by a specially trained health care professional. Talk to your pediatrician regarding the use of this medicine in children. Special care may be needed. Overdosage: If you think you have taken too much of this medicine contact a poison control center or emergency room at once. NOTE: This medicine is  only for you. Do not share this medicine with others. What if I miss a dose? It is important not to miss your dose. Call your doctor or health care professional if you are unable to keep an appointment. What may interact with this medication? Do not take this medicine with any of the following medications: live virus vaccines This medicine may also interact with the following medications: medicines that treat or prevent blood clots like warfarin, enoxaparin, and dalteparin This list may not describe all possible interactions. Give your health care provider a list of all the medicines, herbs, non-prescription drugs, or dietary supplements you use. Also tell them if you smoke, drink alcohol, or use illegal drugs. Some items may interact with your medicine. What should I watch for while using this medication? Visit your doctor for checks on your progress. This drug may make you feel generally unwell. This is not uncommon, as chemotherapy can affect healthy cells as well as cancer cells. Report any side effects. Continue your course of treatment even though you feel ill unless your doctor tells you to stop. In some cases, you may  be given additional medicines to help with side effects. Follow all directions for their use. Call your doctor or health care professional for advice if you get a fever, chills or sore throat, or other symptoms of a cold or flu. Do not treat yourself. This drug decreases your body's ability to fight infections. Try to avoid being around people who are sick. This medicine may increase your risk to bruise or bleed. Call your doctor or health care professional if you notice any unusual bleeding. Be careful brushing and flossing your teeth or using a toothpick because you may get an infection or bleed more easily. If you have any dental work done, tell your dentist you are receiving this medicine. Avoid taking products that contain aspirin, acetaminophen, ibuprofen, naproxen, or  ketoprofen unless instructed by your doctor. These medicines may hide a fever. Do not become pregnant while taking this medicine. Women should inform their doctor if they wish to become pregnant or think they might be pregnant. There is a potential for serious side effects to an unborn child. Talk to your health care professional or pharmacist for more information. Do not breast-feed an infant while taking this medicine. Men should inform their doctor if they wish to father a child. This medicine may lower sperm counts. Do not treat diarrhea with over the counter products. Contact your doctor if you have diarrhea that lasts more than 2 days or if it is severe and watery. This medicine can make you more sensitive to the sun. Keep out of the sun. If you cannot avoid being in the sun, wear protective clothing and use sunscreen. Do not use sun lamps or tanning beds/booths. What side effects may I notice from receiving this medication? Side effects that you should report to your doctor or health care professional as soon as possible: allergic reactions like skin rash, itching or hives, swelling of the face, lips, or tongue low blood counts - this medicine may decrease the number of white blood cells, red blood cells and platelets. You may be at increased risk for infections and bleeding. signs of infection - fever or chills, cough, sore throat, pain or difficulty passing urine signs of decreased platelets or bleeding - bruising, pinpoint red spots on the skin, black, tarry stools, blood in the urine signs of decreased red blood cells - unusually weak or tired, fainting spells, lightheadedness breathing problems changes in vision chest pain mouth sores nausea and vomiting pain, swelling, redness at site where injected pain, tingling, numbness in the hands or feet redness, swelling, or sores on hands or feet stomach pain unusual bleeding Side effects that usually do not require medical attention  (report to your doctor or health care professional if they continue or are bothersome): changes in finger or toe nails diarrhea dry or itchy skin hair loss headache loss of appetite sensitivity of eyes to the light stomach upset unusually teary eyes This list may not describe all possible side effects. Call your doctor for medical advice about side effects. You may report side effects to FDA at 1-800-FDA-1088. Where should I keep my medication? This drug is given in a hospital or clinic and will not be stored at home. NOTE: This sheet is a summary. It may not cover all possible information. If you have questions about this medicine, talk to your doctor, pharmacist, or health care provider.  2022 Elsevier/Gold Standard (2020-09-23 00:00:00)

## 2021-03-31 ENCOUNTER — Ambulatory Visit
Admission: RE | Admit: 2021-03-31 | Discharge: 2021-03-31 | Disposition: A | Discharge status: Home / Self Care | Payer: Medicare Other | Source: Ambulatory Visit | Attending: Radiation Oncology | Admitting: Radiation Oncology

## 2021-03-31 ENCOUNTER — Telehealth: Payer: Self-pay | Admitting: *Deleted

## 2021-03-31 DIAGNOSIS — E785 Hyperlipidemia, unspecified: Secondary | ICD-10-CM | POA: Diagnosis not present

## 2021-03-31 DIAGNOSIS — R519 Headache, unspecified: Secondary | ICD-10-CM | POA: Diagnosis not present

## 2021-03-31 DIAGNOSIS — C774 Secondary and unspecified malignant neoplasm of inguinal and lower limb lymph nodes: Secondary | ICD-10-CM | POA: Diagnosis not present

## 2021-03-31 DIAGNOSIS — C801 Malignant (primary) neoplasm, unspecified: Secondary | ICD-10-CM | POA: Diagnosis not present

## 2021-03-31 DIAGNOSIS — Z7901 Long term (current) use of anticoagulants: Secondary | ICD-10-CM | POA: Diagnosis not present

## 2021-03-31 DIAGNOSIS — R197 Diarrhea, unspecified: Secondary | ICD-10-CM | POA: Diagnosis not present

## 2021-03-31 DIAGNOSIS — R3915 Urgency of urination: Secondary | ICD-10-CM | POA: Diagnosis not present

## 2021-03-31 DIAGNOSIS — K5900 Constipation, unspecified: Secondary | ICD-10-CM | POA: Diagnosis not present

## 2021-03-31 DIAGNOSIS — R0781 Pleurodynia: Secondary | ICD-10-CM | POA: Diagnosis not present

## 2021-03-31 DIAGNOSIS — E119 Type 2 diabetes mellitus without complications: Secondary | ICD-10-CM | POA: Diagnosis not present

## 2021-03-31 DIAGNOSIS — Z923 Personal history of irradiation: Secondary | ICD-10-CM | POA: Diagnosis not present

## 2021-03-31 DIAGNOSIS — I10 Essential (primary) hypertension: Secondary | ICD-10-CM | POA: Diagnosis not present

## 2021-03-31 DIAGNOSIS — I4891 Unspecified atrial fibrillation: Secondary | ICD-10-CM | POA: Diagnosis not present

## 2021-03-31 DIAGNOSIS — I25119 Atherosclerotic heart disease of native coronary artery with unspecified angina pectoris: Secondary | ICD-10-CM | POA: Diagnosis not present

## 2021-03-31 DIAGNOSIS — E559 Vitamin D deficiency, unspecified: Secondary | ICD-10-CM | POA: Diagnosis not present

## 2021-03-31 DIAGNOSIS — J45991 Cough variant asthma: Secondary | ICD-10-CM | POA: Diagnosis not present

## 2021-03-31 DIAGNOSIS — E1169 Type 2 diabetes mellitus with other specified complication: Secondary | ICD-10-CM | POA: Diagnosis not present

## 2021-03-31 DIAGNOSIS — C210 Malignant neoplasm of anus, unspecified: Secondary | ICD-10-CM | POA: Diagnosis not present

## 2021-03-31 DIAGNOSIS — C779 Secondary and unspecified malignant neoplasm of lymph node, unspecified: Secondary | ICD-10-CM | POA: Diagnosis not present

## 2021-03-31 DIAGNOSIS — I700 Atherosclerosis of aorta: Secondary | ICD-10-CM | POA: Diagnosis not present

## 2021-03-31 DIAGNOSIS — Z79899 Other long term (current) drug therapy: Secondary | ICD-10-CM | POA: Diagnosis not present

## 2021-03-31 DIAGNOSIS — I739 Peripheral vascular disease, unspecified: Secondary | ICD-10-CM | POA: Diagnosis not present

## 2021-03-31 DIAGNOSIS — Z510 Encounter for antineoplastic radiation therapy: Secondary | ICD-10-CM | POA: Diagnosis not present

## 2021-03-31 DIAGNOSIS — Z5111 Encounter for antineoplastic chemotherapy: Secondary | ICD-10-CM | POA: Diagnosis not present

## 2021-03-31 NOTE — Telephone Encounter (Signed)
-----   Message from Clyda Hurdle, RN sent at 03/30/2021 12:33 PM EDT ----- ?Regarding: 1st Time Mitomycin/5FU pump-Dr Burr Medico ?Patient is 1st time Mitomycin push with 5FU pump. Pump is scheduled to be removed Friday. ?Dr Ernestina Penna patient. ? ?

## 2021-03-31 NOTE — Progress Notes (Signed)
On 03/31/21 around 1115, patient came to infusion room stating her pump had been beeping while in radiation but is no longer beeping. RN assessed pump and it was running with no complications noted. PICC line cite was clean, dry and intact and patient denied pain at cite. Tubing was noted to be intact with no occlusion seen. RN advised patient to come back with further issues, patient verbalized understanding.  ?

## 2021-03-31 NOTE — Telephone Encounter (Signed)
Called & left message for pt to return call to discuss how she is doing with her treatment.   ?

## 2021-04-01 ENCOUNTER — Other Ambulatory Visit: Payer: Self-pay

## 2021-04-01 ENCOUNTER — Ambulatory Visit
Admission: RE | Admit: 2021-04-01 | Discharge: 2021-04-01 | Disposition: A | Discharge status: Home / Self Care | Payer: Medicare Other | Source: Ambulatory Visit | Attending: Radiation Oncology | Admitting: Radiation Oncology

## 2021-04-01 DIAGNOSIS — C210 Malignant neoplasm of anus, unspecified: Secondary | ICD-10-CM | POA: Diagnosis not present

## 2021-04-01 DIAGNOSIS — E119 Type 2 diabetes mellitus without complications: Secondary | ICD-10-CM | POA: Diagnosis not present

## 2021-04-01 DIAGNOSIS — C774 Secondary and unspecified malignant neoplasm of inguinal and lower limb lymph nodes: Secondary | ICD-10-CM | POA: Diagnosis not present

## 2021-04-01 DIAGNOSIS — I10 Essential (primary) hypertension: Secondary | ICD-10-CM | POA: Diagnosis not present

## 2021-04-01 DIAGNOSIS — Z79899 Other long term (current) drug therapy: Secondary | ICD-10-CM | POA: Diagnosis not present

## 2021-04-01 DIAGNOSIS — R0781 Pleurodynia: Secondary | ICD-10-CM | POA: Diagnosis not present

## 2021-04-01 DIAGNOSIS — E785 Hyperlipidemia, unspecified: Secondary | ICD-10-CM | POA: Diagnosis not present

## 2021-04-01 DIAGNOSIS — Z510 Encounter for antineoplastic radiation therapy: Secondary | ICD-10-CM | POA: Diagnosis not present

## 2021-04-01 DIAGNOSIS — R519 Headache, unspecified: Secondary | ICD-10-CM | POA: Diagnosis not present

## 2021-04-01 DIAGNOSIS — R3915 Urgency of urination: Secondary | ICD-10-CM | POA: Diagnosis not present

## 2021-04-01 DIAGNOSIS — Z923 Personal history of irradiation: Secondary | ICD-10-CM | POA: Diagnosis not present

## 2021-04-01 DIAGNOSIS — Z7901 Long term (current) use of anticoagulants: Secondary | ICD-10-CM | POA: Diagnosis not present

## 2021-04-01 DIAGNOSIS — I4891 Unspecified atrial fibrillation: Secondary | ICD-10-CM | POA: Diagnosis not present

## 2021-04-01 DIAGNOSIS — Z5111 Encounter for antineoplastic chemotherapy: Secondary | ICD-10-CM | POA: Diagnosis not present

## 2021-04-01 DIAGNOSIS — R197 Diarrhea, unspecified: Secondary | ICD-10-CM | POA: Diagnosis not present

## 2021-04-02 ENCOUNTER — Ambulatory Visit
Admission: RE | Admit: 2021-04-02 | Discharge: 2021-04-02 | Disposition: A | Discharge status: Home / Self Care | Payer: Medicare Other | Source: Ambulatory Visit | Attending: Radiation Oncology | Admitting: Radiation Oncology

## 2021-04-02 DIAGNOSIS — I4891 Unspecified atrial fibrillation: Secondary | ICD-10-CM | POA: Diagnosis not present

## 2021-04-02 DIAGNOSIS — C774 Secondary and unspecified malignant neoplasm of inguinal and lower limb lymph nodes: Secondary | ICD-10-CM | POA: Diagnosis not present

## 2021-04-02 DIAGNOSIS — R0781 Pleurodynia: Secondary | ICD-10-CM | POA: Diagnosis not present

## 2021-04-02 DIAGNOSIS — Z5111 Encounter for antineoplastic chemotherapy: Secondary | ICD-10-CM | POA: Diagnosis not present

## 2021-04-02 DIAGNOSIS — C210 Malignant neoplasm of anus, unspecified: Secondary | ICD-10-CM | POA: Diagnosis not present

## 2021-04-02 DIAGNOSIS — E119 Type 2 diabetes mellitus without complications: Secondary | ICD-10-CM | POA: Diagnosis not present

## 2021-04-02 DIAGNOSIS — Z510 Encounter for antineoplastic radiation therapy: Secondary | ICD-10-CM | POA: Diagnosis not present

## 2021-04-02 DIAGNOSIS — E785 Hyperlipidemia, unspecified: Secondary | ICD-10-CM | POA: Diagnosis not present

## 2021-04-02 DIAGNOSIS — R197 Diarrhea, unspecified: Secondary | ICD-10-CM | POA: Diagnosis not present

## 2021-04-02 DIAGNOSIS — Z923 Personal history of irradiation: Secondary | ICD-10-CM | POA: Diagnosis not present

## 2021-04-02 DIAGNOSIS — Z79899 Other long term (current) drug therapy: Secondary | ICD-10-CM | POA: Diagnosis not present

## 2021-04-02 DIAGNOSIS — R3915 Urgency of urination: Secondary | ICD-10-CM | POA: Diagnosis not present

## 2021-04-02 DIAGNOSIS — Z7901 Long term (current) use of anticoagulants: Secondary | ICD-10-CM | POA: Diagnosis not present

## 2021-04-02 DIAGNOSIS — I10 Essential (primary) hypertension: Secondary | ICD-10-CM | POA: Diagnosis not present

## 2021-04-02 DIAGNOSIS — R519 Headache, unspecified: Secondary | ICD-10-CM | POA: Diagnosis not present

## 2021-04-02 LAB — URINALYSIS, COMPLETE (UACMP) WITH MICROSCOPIC
Bacteria, UA: NONE SEEN
Bilirubin Urine: NEGATIVE
Glucose, UA: NEGATIVE mg/dL
Ketones, ur: NEGATIVE mg/dL
Leukocytes,Ua: NEGATIVE
Nitrite: NEGATIVE
Protein, ur: NEGATIVE mg/dL
Specific Gravity, Urine: 1.015 (ref 1.005–1.030)
pH: 6 (ref 5.0–8.0)

## 2021-04-02 LAB — CBC WITH DIFFERENTIAL (CANCER CENTER ONLY)
Abs Immature Granulocytes: 0.02 10*3/uL (ref 0.00–0.07)
Basophils Absolute: 0 10*3/uL (ref 0.0–0.1)
Basophils Relative: 0 %
Eosinophils Absolute: 0.4 10*3/uL (ref 0.0–0.5)
Eosinophils Relative: 7 %
HCT: 39.3 % (ref 36.0–46.0)
Hemoglobin: 12.6 g/dL (ref 12.0–15.0)
Immature Granulocytes: 0 %
Lymphocytes Relative: 22 %
Lymphs Abs: 1.2 10*3/uL (ref 0.7–4.0)
MCH: 29.4 pg (ref 26.0–34.0)
MCHC: 32.1 g/dL (ref 30.0–36.0)
MCV: 91.8 fL (ref 80.0–100.0)
Monocytes Absolute: 0.3 10*3/uL (ref 0.1–1.0)
Monocytes Relative: 5 %
Neutro Abs: 3.6 10*3/uL (ref 1.7–7.7)
Neutrophils Relative %: 66 %
Platelet Count: 265 10*3/uL (ref 150–400)
RBC: 4.28 MIL/uL (ref 3.87–5.11)
RDW: 13.7 % (ref 11.5–15.5)
WBC Count: 5.5 10*3/uL (ref 4.0–10.5)
nRBC: 0 % (ref 0.0–0.2)

## 2021-04-02 LAB — CMP (CANCER CENTER ONLY)
ALT: 22 U/L (ref 0–44)
AST: 25 U/L (ref 15–41)
Albumin: 4 g/dL (ref 3.5–5.0)
Alkaline Phosphatase: 68 U/L (ref 38–126)
Anion gap: 5 (ref 5–15)
BUN: 16 mg/dL (ref 8–23)
CO2: 30 mmol/L (ref 22–32)
Calcium: 9.5 mg/dL (ref 8.9–10.3)
Chloride: 105 mmol/L (ref 98–111)
Creatinine: 0.72 mg/dL (ref 0.44–1.00)
GFR, Estimated: >60 mL/min (ref >60)
Glucose, Bld: 114 mg/dL — ABNORMAL HIGH (ref 70–99)
Potassium: 4.8 mmol/L (ref 3.5–5.1)
Sodium: 140 mmol/L (ref 135–145)
Total Bilirubin: 0.6 mg/dL (ref 0.3–1.2)
Total Protein: 7.5 g/dL (ref 6.5–8.1)

## 2021-04-02 NOTE — Progress Notes (Signed)
Pt here for patient teaching.   ? ?Pt given Radiation and You booklet.   ? ?Reviewed areas of pertinence such as diarrhea, fatigue, hair loss, nausea and vomiting, sexual and fertility changes, skin changes, and urinary and bladder changes .  ? ?Pt able to give teach back of to pat skin, use unscented/gentle soap, use baby wipes, have Imodium on hand, drink plenty of water, and sitz bath,avoid applying anything to skin within 4 hours of treatment.  ? ?Pt verbalizes understanding of information given and will contact nursing with any questions or concerns.   ? ? ? ? ?  ?

## 2021-04-03 ENCOUNTER — Other Ambulatory Visit: Payer: Medicare Other

## 2021-04-03 ENCOUNTER — Ambulatory Visit: Payer: Medicare Other | Admitting: Physician Assistant

## 2021-04-03 ENCOUNTER — Ambulatory Visit
Admission: RE | Admit: 2021-04-03 | Discharge: 2021-04-03 | Disposition: A | Discharge status: Home / Self Care | Payer: Medicare Other | Source: Ambulatory Visit | Attending: Radiation Oncology | Admitting: Radiation Oncology

## 2021-04-03 ENCOUNTER — Inpatient Hospital Stay: Payer: Medicare Other

## 2021-04-03 ENCOUNTER — Other Ambulatory Visit: Payer: Self-pay

## 2021-04-03 ENCOUNTER — Ambulatory Visit: Payer: Medicare Other

## 2021-04-03 VITALS — BP 148/65 | HR 74 | Temp 98.4°F | Resp 16

## 2021-04-03 DIAGNOSIS — I4891 Unspecified atrial fibrillation: Secondary | ICD-10-CM | POA: Diagnosis not present

## 2021-04-03 DIAGNOSIS — Z923 Personal history of irradiation: Secondary | ICD-10-CM | POA: Diagnosis not present

## 2021-04-03 DIAGNOSIS — R3915 Urgency of urination: Secondary | ICD-10-CM | POA: Diagnosis not present

## 2021-04-03 DIAGNOSIS — C210 Malignant neoplasm of anus, unspecified: Secondary | ICD-10-CM | POA: Diagnosis not present

## 2021-04-03 DIAGNOSIS — Z452 Encounter for adjustment and management of vascular access device: Secondary | ICD-10-CM

## 2021-04-03 DIAGNOSIS — R519 Headache, unspecified: Secondary | ICD-10-CM | POA: Diagnosis not present

## 2021-04-03 DIAGNOSIS — Z79899 Other long term (current) drug therapy: Secondary | ICD-10-CM | POA: Diagnosis not present

## 2021-04-03 DIAGNOSIS — C774 Secondary and unspecified malignant neoplasm of inguinal and lower limb lymph nodes: Secondary | ICD-10-CM | POA: Diagnosis not present

## 2021-04-03 DIAGNOSIS — Z7901 Long term (current) use of anticoagulants: Secondary | ICD-10-CM | POA: Diagnosis not present

## 2021-04-03 DIAGNOSIS — E119 Type 2 diabetes mellitus without complications: Secondary | ICD-10-CM | POA: Diagnosis not present

## 2021-04-03 DIAGNOSIS — R197 Diarrhea, unspecified: Secondary | ICD-10-CM | POA: Diagnosis not present

## 2021-04-03 DIAGNOSIS — Z510 Encounter for antineoplastic radiation therapy: Secondary | ICD-10-CM | POA: Diagnosis not present

## 2021-04-03 DIAGNOSIS — E785 Hyperlipidemia, unspecified: Secondary | ICD-10-CM | POA: Diagnosis not present

## 2021-04-03 DIAGNOSIS — Z5111 Encounter for antineoplastic chemotherapy: Secondary | ICD-10-CM | POA: Diagnosis not present

## 2021-04-03 DIAGNOSIS — R0781 Pleurodynia: Secondary | ICD-10-CM | POA: Diagnosis not present

## 2021-04-03 DIAGNOSIS — I10 Essential (primary) hypertension: Secondary | ICD-10-CM | POA: Diagnosis not present

## 2021-04-03 MED ORDER — SODIUM CHLORIDE 0.9% FLUSH
10.0000 mL | INTRAVENOUS | Status: DC | PRN
  Administered 2021-04-03: 10 mL via INTRAVENOUS

## 2021-04-03 MED ORDER — HEPARIN SOD (PORK) LOCK FLUSH 100 UNIT/ML IV SOLN
500.0000 [IU] | Freq: Once | INTRAVENOUS | Status: AC
  Administered 2021-04-03: 500 [IU] via INTRAVENOUS

## 2021-04-03 NOTE — Progress Notes (Signed)
PICC Removal Note: ?S: treatment finished, doctors order to remove picc line following chemotherapy treatment.   ?O: PICC line removed from right antecubital after sterile site prepped per protocol. PICC catheter tip visualized and intact. Pressure dressing applied with medipore tape and Vaseline Lorelle [place]. ?A: No redness, ecchymosis, edema, swelling, or drainage noted at site. ?P: Instructions provided on post PICC discharge care, including followup notification instructions. ? ?

## 2021-04-03 NOTE — Patient Instructions (Signed)
Extracci?n del cat?ter CCIP en los adultos, cuidados posteriores ?PICC Removal, Adult, Care After ?La siguiente informaci?n ofrece orientaci?n sobre c?mo cuidarse despu?s del procedimiento. El m?dico tambi?n podr? darle instrucciones m?s espec?ficas. Comun?quese con el m?dico si tiene problemas o preguntas. ??Qu? puedo esperar despu?s del procedimiento? ?Despu?s del procedimiento, es com?n tener los siguientes s?ntomas: ?Sensibilidad o Social research officer, government. ?Enrojecimiento, hinchaz?n o Solicitor de donde se extrajo Designer, television/film set (lugar de salida). ?Siga estas instrucciones en su casa: ?Durante las primeras 24 horas despu?s del procedimiento: ?Location manager venda (vendaje) del lugar de salida limpia y seca. No se quite el vendaje hasta que el m?dico se lo indique. ?No levante nada pesado ni haga actividades que requieran un gran esfuerzo hasta que su m?dico lo autorice. Debe evitar lo siguiente: ?Levantamiento de pesas. ?Hacer trabajos de jardiner?a. ?Hacer actividad f?sica que requiera movimientos repetitivos con Water engineer. ?Est? muy atento a cualquier indicio de [place] de aire en la vena (embolia gaseosa). Esta es una complicaci?n muy poco frecuente, pero grave. ?Los signos de embolia gaseosa incluyen dificultad para respirar, sibilancias, dolor en el pecho o pulso acelerado. ?Si tiene signos de una embolia gaseosa, llame al 911 de inmediato y recu?stese sobre su lado izquierdo para evitar que el aire se dirija a los pulmones. ?Despu?s de que hayan pasado 24 horas: ? ?Retire el vendaje como se lo haya indicado el m?dico. L?vese las manos con agua y jab?n durante al menos 20 segundos antes y despu?s de cambiar el vendaje. Use desinfectante para manos si no dispone de agua y jab?n. ?Retome sus actividades normales como se lo haya indicado el m?dico. ?Posiblemente se forme una peque?a Solicitor de salida. No toque la costra. ?Cuando se ba?e o duche, lave suavemente el punto de salida con agua y jab?n. Seque sin  frotar. ?Est? atento a los signos de infecci?n, por ejemplo: ?Fiebre o escalofr?os. ?Ganglios inflamados en la axila. ?M?s enrojecimiento, hinchaz?n o dolor alrededor del brazo. ?Sangre, l?quido o pus provenientes del lugar de salida. ?Calor o mal olor que proviene del lugar de salida. ?Una l?nea roja que se extiende desde TEFL teacher de salida. ?Instrucciones generales ?Use los medicamentos de venta libre y los recetados solamente como se lo haya indicado el m?dico. No tome ning?n medicamento nuevo sin consultar antes con el m?dico. ?Si le recetaron una pomada con antibi?tico, apl?quesela como se lo haya indicado el m?dico. ?Concurra a [place]. Esto es importante. ?Comun?quese con un m?dico si: ?Tiene fiebre o escalofr?os. ?Tiene hinchaz?n en el lugar de salida o los ganglios hinchados en la axila. ?Tiene signos de infecci?n en el lugar de salida. ?Tiene dolor, enrojecimiento o hinchaz?n en el brazo que [place]. ?Solicite ayuda de inmediato si: ?Tiene adormecimiento u hormigueo en los dedos, la mano o el brazo. ?El brazo est? azulado y fr?o. ?Tiene signos de embolia gaseosa, como dificultad para respirar, sibilancias, dolor en el pecho o pulso acelerado. ?Estos s?ntomas pueden Sales executive. Solicite atenci?n m?dica de inmediato. Llame al 911. ?No espere a ver si los s?ntomas desaparecen. ?No conduzca por sus propios medios Principal Financial. ?Resumen ?Despu?s de la extracci?n de un cat?ter central de inserci?n perif?rica (CCIP), es normal tener sensibilidad o dolor a la palpaci?n, enrojecimiento, hinchaz?n o una Solicitor de salida. ?Mantenga la venda (vendaje) que est? Dance movement psychotherapist de salida limpia y seca. No se quite el vendaje hasta que el m?dico se lo indique. ?No levante nada pesado  ni haga actividades que requieran un gran esfuerzo hasta que su m?dico lo autorice. ?Preste mucha atenci?n a cualquier signo de una burbuja de aire (embolia gaseosa). Si tiene signos de  una embolia gaseosa, llame al 911 de inmediato y recu?stese sobre su lado izquierdo. ?Esta informaci?n no tiene Marine scientist el consejo del m?dico. Aseg?rese de hacerle al m?dico cualquier pregunta que tenga. ?Document Revised: 08/13/2020 Document Reviewed: 08/13/2020 ?Elsevier Patient Education ? [place]. ? ?

## 2021-04-04 LAB — URINE CULTURE

## 2021-04-06 ENCOUNTER — Ambulatory Visit
Admission: RE | Admit: 2021-04-06 | Discharge: 2021-04-06 | Disposition: A | Discharge status: Home / Self Care | Payer: Medicare Other | Source: Ambulatory Visit | Attending: Radiation Oncology | Admitting: Radiation Oncology

## 2021-04-06 ENCOUNTER — Ambulatory Visit: Payer: Medicare Other

## 2021-04-06 ENCOUNTER — Other Ambulatory Visit: Payer: Self-pay

## 2021-04-06 DIAGNOSIS — E785 Hyperlipidemia, unspecified: Secondary | ICD-10-CM | POA: Diagnosis not present

## 2021-04-06 DIAGNOSIS — Z7901 Long term (current) use of anticoagulants: Secondary | ICD-10-CM | POA: Diagnosis not present

## 2021-04-06 DIAGNOSIS — R197 Diarrhea, unspecified: Secondary | ICD-10-CM | POA: Diagnosis not present

## 2021-04-06 DIAGNOSIS — I10 Essential (primary) hypertension: Secondary | ICD-10-CM | POA: Diagnosis not present

## 2021-04-06 DIAGNOSIS — C210 Malignant neoplasm of anus, unspecified: Secondary | ICD-10-CM | POA: Diagnosis not present

## 2021-04-06 DIAGNOSIS — Z923 Personal history of irradiation: Secondary | ICD-10-CM | POA: Diagnosis not present

## 2021-04-06 DIAGNOSIS — R519 Headache, unspecified: Secondary | ICD-10-CM | POA: Diagnosis not present

## 2021-04-06 DIAGNOSIS — R3915 Urgency of urination: Secondary | ICD-10-CM | POA: Diagnosis not present

## 2021-04-06 DIAGNOSIS — C774 Secondary and unspecified malignant neoplasm of inguinal and lower limb lymph nodes: Secondary | ICD-10-CM | POA: Diagnosis not present

## 2021-04-06 DIAGNOSIS — Z510 Encounter for antineoplastic radiation therapy: Secondary | ICD-10-CM | POA: Diagnosis not present

## 2021-04-06 DIAGNOSIS — R0781 Pleurodynia: Secondary | ICD-10-CM | POA: Diagnosis not present

## 2021-04-06 DIAGNOSIS — E119 Type 2 diabetes mellitus without complications: Secondary | ICD-10-CM | POA: Diagnosis not present

## 2021-04-06 DIAGNOSIS — Z5111 Encounter for antineoplastic chemotherapy: Secondary | ICD-10-CM | POA: Diagnosis not present

## 2021-04-06 DIAGNOSIS — I4891 Unspecified atrial fibrillation: Secondary | ICD-10-CM | POA: Diagnosis not present

## 2021-04-06 DIAGNOSIS — Z79899 Other long term (current) drug therapy: Secondary | ICD-10-CM | POA: Diagnosis not present

## 2021-04-07 ENCOUNTER — Ambulatory Visit
Admission: RE | Admit: 2021-04-07 | Discharge: 2021-04-07 | Disposition: A | Discharge status: Home / Self Care | Payer: Medicare Other | Source: Ambulatory Visit | Attending: Radiation Oncology | Admitting: Radiation Oncology

## 2021-04-07 DIAGNOSIS — R0781 Pleurodynia: Secondary | ICD-10-CM | POA: Diagnosis not present

## 2021-04-07 DIAGNOSIS — Z79899 Other long term (current) drug therapy: Secondary | ICD-10-CM | POA: Diagnosis not present

## 2021-04-07 DIAGNOSIS — I10 Essential (primary) hypertension: Secondary | ICD-10-CM | POA: Diagnosis not present

## 2021-04-07 DIAGNOSIS — R519 Headache, unspecified: Secondary | ICD-10-CM | POA: Diagnosis not present

## 2021-04-07 DIAGNOSIS — Z5111 Encounter for antineoplastic chemotherapy: Secondary | ICD-10-CM | POA: Diagnosis not present

## 2021-04-07 DIAGNOSIS — C774 Secondary and unspecified malignant neoplasm of inguinal and lower limb lymph nodes: Secondary | ICD-10-CM | POA: Diagnosis not present

## 2021-04-07 DIAGNOSIS — I4891 Unspecified atrial fibrillation: Secondary | ICD-10-CM | POA: Diagnosis not present

## 2021-04-07 DIAGNOSIS — C210 Malignant neoplasm of anus, unspecified: Secondary | ICD-10-CM | POA: Diagnosis not present

## 2021-04-07 DIAGNOSIS — R3915 Urgency of urination: Secondary | ICD-10-CM | POA: Diagnosis not present

## 2021-04-07 DIAGNOSIS — E119 Type 2 diabetes mellitus without complications: Secondary | ICD-10-CM | POA: Diagnosis not present

## 2021-04-07 DIAGNOSIS — E785 Hyperlipidemia, unspecified: Secondary | ICD-10-CM | POA: Diagnosis not present

## 2021-04-07 DIAGNOSIS — Z923 Personal history of irradiation: Secondary | ICD-10-CM | POA: Diagnosis not present

## 2021-04-07 DIAGNOSIS — Z510 Encounter for antineoplastic radiation therapy: Secondary | ICD-10-CM | POA: Diagnosis not present

## 2021-04-07 DIAGNOSIS — Z7901 Long term (current) use of anticoagulants: Secondary | ICD-10-CM | POA: Diagnosis not present

## 2021-04-07 DIAGNOSIS — R197 Diarrhea, unspecified: Secondary | ICD-10-CM | POA: Diagnosis not present

## 2021-04-08 ENCOUNTER — Other Ambulatory Visit: Payer: Self-pay

## 2021-04-08 ENCOUNTER — Ambulatory Visit
Admission: RE | Admit: 2021-04-08 | Discharge: 2021-04-08 | Disposition: A | Discharge status: Home / Self Care | Payer: Medicare Other | Source: Ambulatory Visit | Attending: Radiation Oncology | Admitting: Radiation Oncology

## 2021-04-08 DIAGNOSIS — Z5111 Encounter for antineoplastic chemotherapy: Secondary | ICD-10-CM | POA: Diagnosis not present

## 2021-04-08 DIAGNOSIS — Z79899 Other long term (current) drug therapy: Secondary | ICD-10-CM | POA: Diagnosis not present

## 2021-04-08 DIAGNOSIS — Z7901 Long term (current) use of anticoagulants: Secondary | ICD-10-CM | POA: Diagnosis not present

## 2021-04-08 DIAGNOSIS — Z923 Personal history of irradiation: Secondary | ICD-10-CM | POA: Diagnosis not present

## 2021-04-08 DIAGNOSIS — R519 Headache, unspecified: Secondary | ICD-10-CM | POA: Diagnosis not present

## 2021-04-08 DIAGNOSIS — C774 Secondary and unspecified malignant neoplasm of inguinal and lower limb lymph nodes: Secondary | ICD-10-CM | POA: Diagnosis not present

## 2021-04-08 DIAGNOSIS — Z510 Encounter for antineoplastic radiation therapy: Secondary | ICD-10-CM | POA: Diagnosis not present

## 2021-04-08 DIAGNOSIS — C210 Malignant neoplasm of anus, unspecified: Secondary | ICD-10-CM | POA: Diagnosis not present

## 2021-04-08 DIAGNOSIS — R0781 Pleurodynia: Secondary | ICD-10-CM | POA: Diagnosis not present

## 2021-04-08 DIAGNOSIS — E785 Hyperlipidemia, unspecified: Secondary | ICD-10-CM | POA: Diagnosis not present

## 2021-04-08 DIAGNOSIS — I10 Essential (primary) hypertension: Secondary | ICD-10-CM | POA: Diagnosis not present

## 2021-04-08 DIAGNOSIS — R3915 Urgency of urination: Secondary | ICD-10-CM | POA: Diagnosis not present

## 2021-04-08 DIAGNOSIS — E119 Type 2 diabetes mellitus without complications: Secondary | ICD-10-CM | POA: Diagnosis not present

## 2021-04-08 DIAGNOSIS — R197 Diarrhea, unspecified: Secondary | ICD-10-CM | POA: Diagnosis not present

## 2021-04-08 DIAGNOSIS — I4891 Unspecified atrial fibrillation: Secondary | ICD-10-CM | POA: Diagnosis not present

## 2021-04-08 NOTE — Progress Notes (Signed)
Lor[place] ?OFFICE PROGRESS NOTE ? ?[name], Lorraine Gilbert ?[address] ?[place] [address] ? ?DIAGNOSIS: Anal Cancer ? ?Oncology History  ?Metastatic squamous cell carcinoma involving lymph node with unknown primary site [facility])  ?02/06/2018 Imaging  ? Impression CT abdomen and pelvis ?1.  Enlarged left groin lymph node which is of uncertain etiology. This would be amenable to percutaneous sampling if deemed clinically appropriate. ?2.  Hepatic steatosis. Focal area of low-attenuation in the far lateral left hepatic lobe, not clearly seen on the prior exam. Consider liver ultrasound for further evaluation.  ?3.  Mild L1 compression deformity which is new. ?  ?02/28/2018 Pathology Results  ? Left groin lymph node, needle core biopsy: ?      -Metastatic squamous cell carcinoma, see comment ? ?P16 stain is strongly positive, suggestive of cervical or anogenital origin in this location. ?Immunohistochemical stains for CK5, p63, and pan cytokeratin are positive. ER is weakly positive. CK7 and CK20 are negative. ?  ?03/15/2018 Pathology Results  ? 1. Cervix, biopsy, 12 o'clock ?- BENIGN SQUAMOUS MUCOSA WITH INFLAMMATION. ?- NO DYSPLASIA OR MALIGNANCY. ?2. Endocervix, curettage ?- BENIGN SQUAMOUS EPITHELIUM. ?  ?03/23/2018 PET scan  ? 1. Isolated intensely hypermetabolic enlarged LEFT inguinal lymph node. No clear primary identified. ?2. No abnormal activity associated with the vagina or anorectal tissue. ?3. No hypermetabolic lymph nodes in the pelvis. ?4. Two adjacent nodules in the RIGHT upper lobe with very low metabolic activity. These are not favored primary malignancies. Recommend follow-up per Fleischner criteria. Non-contrast chest CT at 6-12 months is recommended.  ?  ?07/05/2018 - 07/18/2018 Radiation Therapy  ? Left inguinal area/nodes treated to 30 Gy in 10 fractions ?  ?09/22/2020 Imaging  ? EXAM: CT ABDOMEN PELVIS W IV CONTRAST  ? ?IMPRESSION:  ?1. Multicystic enhancing right adnexal  lesion with mild surrounding stranding. Given short interval since recent CT 3 weeks prior, this is more suspicious for acute findings such as torsion or infection, less likely neoplasm. Recommend endovaginal ultrasound for further evaluation and follow-up to resolution.  ?2. Other stable chronic findings.  ?  ?01/30/2021 Pathology Results  ? DIAGNOSIS  ? ?SMALL  TUFT OF TISSUE ABOVE ANAL VERGE, ENDOSCOPIC BIOPSIES:  ? - HIGH-GRADE SQUAMOUS INTRAEPITHELIAL LESION/ANAL INTRAEPITHELIAL NEOPLASIA 2-3 OUT OF 3 (HGSIL/AIN 2-3).  ? - SEE COMMENT.  ?(MPL002)  ? ?COMMENT  ?MORPHOLOGICALLY THERE IS AT LEAST TWO THIRDS THICKNESS INVOLVEMENT BY DYSPLASTIC EPITHELIUM WITH MID-LEVEL MITOSES SECONDARY HOWEVER DUE TO POOR ORIENTATION AND MORPHOLOGIC ASSESSMENT IS 2 SERVICE MUCOSA IS ONLY PRESENT IN FOCAL AREAS MORPHOLOGICALLY THIS REPRESENTS AT LEAST AIN 2 AND MOST LIKELY AIN 3.  IN EITHER RESPECT THE FINDINGS ARE CONSISTENT WITH HIGH-GRADE SQUAMOUS INTRAEPITHELIAL LESION WHICH IS ALSO CONFIRMED BY STRONGLY BLOCK LIKE P16 IMMUNOHISTOCHEMICAL  POSITIVITY.  ?  ?03/05/2021 Pathology Results  ? Final Diagnosis    ?Rectal mass, biopsy: ?-Invasive squamous cell carcinoma, moderately differentiated  ? ?Addendum 1    ?The carcinoma was analyzed by [facility] for DNA mismatch repair proteins.  The neoplasm retained nuclear expression of all four mismatch repair proteins, MLH1, PMS2, MSH2, MSH6.  Controls worked appropriately.  ? ?  ?Anal cancer ([place])  ?01/30/2021 Cancer Staging  ? Staging form: Anus, AJCC V9 ?- Clinical stage from 01/30/2021: Stage I (cT1, cN0, cM0) - Signed by Truitt Merle, Lorraine Gilbert on 03/11/2021 ?Stage prefix: Initial diagnosis ?Histologic grade (G): G2 ?Histologic grading system: 4 grade system ? ?  ?03/11/2021 Initial Diagnosis  ? Anal cancer ([place]) ?  ?  03/30/2021 -  Chemotherapy  ? Patient is on Treatment Plan : ANUS Mitomycin D1,28 / 5FU D1-4, 28-31 q32d  ?   ? ? ? ? ? ?CURRENT THERAPY: concurrent chemoradiation with 5-FU and  mitomycin ? ?INTERVAL HISTORY: ?Lorraine Gilbert 88 y.o. female returns to the clinic today for a 1 week follow-up visit.  The patient is currently undergoing treatment with concurrent chemoradiation for her anal cancer.  She underwent her first cycle of chemotherapy last week and she tolerated it well, she had mild diarrhea which resolved with imodium.  She had a 20% dose reduction in her chemotherapy to see how she tolerates it. The patient does have some external vaginal/rectal burning/itching from her radiation. She was given a cream without relief. For discomfort, she may take 1 tylenol daily which helps with discomfort but not the irritation. The patient denies any pelvic pain.  Denies any rectal bleeding.  She denies any fever, chills, or night sweats. Her weight is stable but she has a diminished appetite.  She drinks ensure about 1x per day.  She denies any nausea or vomiting.  Denies any hematuria.  Denies any changes with her bowel or bladder habits. She states she had a UA last week that was negative for infection due to the external itching/burning. Her last appointment, she was endorsing lateral right-sided rib pain.  An x-ray was ordered but the patient never had this performed.  The patient has a chronic cough secondary to postnasal drainage/allergies for which she was previously seen by an allergist.  The patient is here today for repeat blood work and evaluation. ? ?MEDICAL HISTORY: ?Past Medical History:  ?Diagnosis Date  ? A-fib ([place])   ? Abnormal heart rhythm   ? Allergic rhinitis   ? Anxiety   ? Asthma   ? Diabetes mellitus without complication ([place])   ? Type II  ? GERD (gastroesophageal reflux disease)   ? Headache   ? History of radiation therapy 07/05/2018-07/18/2018  ? left pelvis        Dr Gery Pray  ? Hypertension   ? Neuromuscular disorder ([place])   ? Siactic pain in right leg  ? Osteopenia   ? Rheumatoid arthritis ([place])   ? Rotator cuff tear arthropathy   ? Right  ? Vertigo    ? ? ?ALLERGIES:  is allergic to ibuprofen. ? ?MEDICATIONS:  ?Current Outpatient Medications  ?Medication Sig Dispense Refill  ? ACCU-CHEK AVIVA PLUS test strip 1 each as directed.    ? Accu-Chek Softclix Lancets lancets 1 each by Other route as directed.    ? ACETAMINOPHEN 8 HOUR PO Take 1 tablet by mouth as needed.    ? albuterol (PROVENTIL) (2.5 MG/3ML) 0.083% nebulizer solution SMARTSIG:1 Vial(s) Via Nebulizer Every 4-6 Hours PRN    ? Alcohol Swabs (ALCOHOL WIPES) 70 % PADS DropSafe Alcohol Prep Pads    ? amiodarone (PACERONE) 100 MG tablet Take 100 mg by mouth daily.    ? Blood Glucose Calibration (ACCU-CHEK AVIVA) SOLN     ? Blood Glucose Monitoring Suppl (TRUE METRIX AIR GLUCOSE METER) w/Device KIT See admin instructions.    ? clopidogrel (PLAVIX) 75 MG tablet Take 75 mg by mouth daily.    ? diclofenac Sodium (VOLTAREN) 1 % GEL Apply topically.    ? ELIQUIS 2.5 MG TABS tablet Take 2.5 mg by mouth 2 (two) times daily.    ? escitalopram (LEXAPRO) 10 MG tablet Take 10 mg by mouth daily.    ? folic acid (FOLVITE)  1 MG tablet Take by mouth.    ? levalbuterol (XOPENEX) 1.25 MG/3ML nebulizer solution Inhale into the lungs.    ? polyethylene glycol powder (GLYCOLAX/MIRALAX) 17 GM/SCOOP powder Take by mouth.    ? rosuvastatin (CRESTOR) 5 MG tablet Take by mouth.    ? TRELEGY ELLIPTA 200-62.5-25 MCG/ACT AEPB Take 1 puff by mouth daily.    ? Vitamin D, Ergocalciferol, 50000 units CAPS 1 capsule    ? nitroGLYCERIN (NITROSTAT) 0.4 MG SL tablet Place 0.4 mg under the tongue every 5 (five) minutes as needed for chest pain. (Patient not taking: Reported on 03/30/2021)    ? ondansetron (ZOFRAN) 4 MG tablet Take 1 tablet (4 mg total) by mouth every 8 (eight) hours as needed for nausea or vomiting. (Patient not taking: Reported on 03/30/2021) 20 tablet 1  ? prochlorperazine (COMPAZINE) 10 MG tablet Take 1 tablet (10 mg total) by mouth every 6 (six) hours as needed for nausea or vomiting. (Patient not taking: Reported on  03/30/2021) 30 tablet 1  ? ?No current facility-administered medications for this visit.  ? ? ?SURGICAL HISTORY:  ?Past Surgical History:  ?Procedure Laterality Date  ? BIOPSY  12/13/2018  ? Procedure: BIOPSY;  Surgeon: Wonda Horner, Lorraine Gilbert;  Vickii Chafe

## 2021-04-09 ENCOUNTER — Ambulatory Visit
Admission: RE | Admit: 2021-04-09 | Discharge: 2021-04-09 | Disposition: A | Discharge status: Home / Self Care | Payer: Medicare Other | Source: Ambulatory Visit | Attending: Radiation Oncology | Admitting: Radiation Oncology

## 2021-04-09 DIAGNOSIS — Z510 Encounter for antineoplastic radiation therapy: Secondary | ICD-10-CM | POA: Diagnosis not present

## 2021-04-09 DIAGNOSIS — R519 Headache, unspecified: Secondary | ICD-10-CM | POA: Diagnosis not present

## 2021-04-09 DIAGNOSIS — Z79899 Other long term (current) drug therapy: Secondary | ICD-10-CM | POA: Diagnosis not present

## 2021-04-09 DIAGNOSIS — E119 Type 2 diabetes mellitus without complications: Secondary | ICD-10-CM | POA: Diagnosis not present

## 2021-04-09 DIAGNOSIS — Z7901 Long term (current) use of anticoagulants: Secondary | ICD-10-CM | POA: Diagnosis not present

## 2021-04-09 DIAGNOSIS — Z5111 Encounter for antineoplastic chemotherapy: Secondary | ICD-10-CM | POA: Diagnosis not present

## 2021-04-09 DIAGNOSIS — Z923 Personal history of irradiation: Secondary | ICD-10-CM | POA: Diagnosis not present

## 2021-04-09 DIAGNOSIS — E785 Hyperlipidemia, unspecified: Secondary | ICD-10-CM | POA: Diagnosis not present

## 2021-04-09 DIAGNOSIS — R3915 Urgency of urination: Secondary | ICD-10-CM | POA: Diagnosis not present

## 2021-04-09 DIAGNOSIS — R0781 Pleurodynia: Secondary | ICD-10-CM | POA: Diagnosis not present

## 2021-04-09 DIAGNOSIS — I4891 Unspecified atrial fibrillation: Secondary | ICD-10-CM | POA: Diagnosis not present

## 2021-04-09 DIAGNOSIS — I10 Essential (primary) hypertension: Secondary | ICD-10-CM | POA: Diagnosis not present

## 2021-04-09 DIAGNOSIS — C774 Secondary and unspecified malignant neoplasm of inguinal and lower limb lymph nodes: Secondary | ICD-10-CM | POA: Diagnosis not present

## 2021-04-09 DIAGNOSIS — R197 Diarrhea, unspecified: Secondary | ICD-10-CM | POA: Diagnosis not present

## 2021-04-09 DIAGNOSIS — C210 Malignant neoplasm of anus, unspecified: Secondary | ICD-10-CM | POA: Diagnosis not present

## 2021-04-10 ENCOUNTER — Other Ambulatory Visit: Payer: Medicare Other

## 2021-04-10 ENCOUNTER — Other Ambulatory Visit: Payer: Self-pay

## 2021-04-10 ENCOUNTER — Inpatient Hospital Stay (HOSPITAL_BASED_OUTPATIENT_CLINIC_OR_DEPARTMENT_OTHER): Payer: Medicare Other | Admitting: Physician Assistant

## 2021-04-10 ENCOUNTER — Ambulatory Visit
Admission: RE | Admit: 2021-04-10 | Discharge: 2021-04-10 | Disposition: A | Discharge status: Home / Self Care | Payer: Medicare Other | Source: Ambulatory Visit | Attending: Radiation Oncology | Admitting: Radiation Oncology

## 2021-04-10 ENCOUNTER — Inpatient Hospital Stay: Payer: Medicare Other

## 2021-04-10 ENCOUNTER — Encounter: Payer: Self-pay | Admitting: Physician Assistant

## 2021-04-10 VITALS — BP 149/42 | HR 74 | Temp 97.7°F | Resp 18 | Wt 137.0 lb

## 2021-04-10 DIAGNOSIS — I4891 Unspecified atrial fibrillation: Secondary | ICD-10-CM | POA: Diagnosis not present

## 2021-04-10 DIAGNOSIS — C210 Malignant neoplasm of anus, unspecified: Secondary | ICD-10-CM

## 2021-04-10 DIAGNOSIS — C774 Secondary and unspecified malignant neoplasm of inguinal and lower limb lymph nodes: Secondary | ICD-10-CM | POA: Diagnosis not present

## 2021-04-10 DIAGNOSIS — Z7901 Long term (current) use of anticoagulants: Secondary | ICD-10-CM | POA: Diagnosis not present

## 2021-04-10 DIAGNOSIS — I10 Essential (primary) hypertension: Secondary | ICD-10-CM | POA: Diagnosis not present

## 2021-04-10 DIAGNOSIS — R197 Diarrhea, unspecified: Secondary | ICD-10-CM | POA: Diagnosis not present

## 2021-04-10 DIAGNOSIS — E119 Type 2 diabetes mellitus without complications: Secondary | ICD-10-CM | POA: Diagnosis not present

## 2021-04-10 DIAGNOSIS — Z923 Personal history of irradiation: Secondary | ICD-10-CM | POA: Diagnosis not present

## 2021-04-10 DIAGNOSIS — Z79899 Other long term (current) drug therapy: Secondary | ICD-10-CM | POA: Diagnosis not present

## 2021-04-10 DIAGNOSIS — E785 Hyperlipidemia, unspecified: Secondary | ICD-10-CM | POA: Diagnosis not present

## 2021-04-10 DIAGNOSIS — R3915 Urgency of urination: Secondary | ICD-10-CM | POA: Diagnosis not present

## 2021-04-10 DIAGNOSIS — Z5111 Encounter for antineoplastic chemotherapy: Secondary | ICD-10-CM | POA: Diagnosis not present

## 2021-04-10 DIAGNOSIS — Z510 Encounter for antineoplastic radiation therapy: Secondary | ICD-10-CM | POA: Diagnosis not present

## 2021-04-10 DIAGNOSIS — R519 Headache, unspecified: Secondary | ICD-10-CM | POA: Diagnosis not present

## 2021-04-10 DIAGNOSIS — R0781 Pleurodynia: Secondary | ICD-10-CM | POA: Diagnosis not present

## 2021-04-10 LAB — CMP (CANCER CENTER ONLY)
ALT: 24 U/L (ref 0–44)
AST: 27 U/L (ref 15–41)
Albumin: 3.7 g/dL (ref 3.5–5.0)
Alkaline Phosphatase: 61 U/L (ref 38–126)
Anion gap: 6 (ref 5–15)
BUN: 17 mg/dL (ref 8–23)
CO2: 26 mmol/L (ref 22–32)
Calcium: 8.7 mg/dL — ABNORMAL LOW (ref 8.9–10.3)
Chloride: 108 mmol/L (ref 98–111)
Creatinine: 0.81 mg/dL (ref 0.44–1.00)
GFR, Estimated: >60 mL/min (ref >60)
Glucose, Bld: 181 mg/dL — ABNORMAL HIGH (ref 70–99)
Potassium: 4.1 mmol/L (ref 3.5–5.1)
Sodium: 140 mmol/L (ref 135–145)
Total Bilirubin: 0.3 mg/dL (ref 0.3–1.2)
Total Protein: 6.6 g/dL (ref 6.5–8.1)

## 2021-04-10 LAB — CBC WITH DIFFERENTIAL (CANCER CENTER ONLY)
Abs Immature Granulocytes: 0.01 10*3/uL (ref 0.00–0.07)
Basophils Absolute: 0 10*3/uL (ref 0.0–0.1)
Basophils Relative: 0 %
Eosinophils Absolute: 0.2 10*3/uL (ref 0.0–0.5)
Eosinophils Relative: 8 %
HCT: 34.6 % — ABNORMAL LOW (ref 36.0–46.0)
Hemoglobin: 11.2 g/dL — ABNORMAL LOW (ref 12.0–15.0)
Immature Granulocytes: 0 %
Lymphocytes Relative: 16 %
Lymphs Abs: 0.5 10*3/uL — ABNORMAL LOW (ref 0.7–4.0)
MCH: 29.6 pg (ref 26.0–34.0)
MCHC: 32.4 g/dL (ref 30.0–36.0)
MCV: 91.3 fL (ref 80.0–100.0)
Monocytes Absolute: 0.2 10*3/uL (ref 0.1–1.0)
Monocytes Relative: 8 %
Neutro Abs: 2 10*3/uL (ref 1.7–7.7)
Neutrophils Relative %: 68 %
Platelet Count: 168 10*3/uL (ref 150–400)
RBC: 3.79 MIL/uL — ABNORMAL LOW (ref 3.87–5.11)
RDW: 13.3 % (ref 11.5–15.5)
WBC Count: 2.9 10*3/uL — ABNORMAL LOW (ref 4.0–10.5)
nRBC: 0 % (ref 0.0–0.2)

## 2021-04-13 ENCOUNTER — Ambulatory Visit
Admission: RE | Admit: 2021-04-13 | Discharge: 2021-04-13 | Disposition: A | Discharge status: Home / Self Care | Payer: Medicare Other | Source: Ambulatory Visit | Attending: Radiation Oncology | Admitting: Radiation Oncology

## 2021-04-13 ENCOUNTER — Other Ambulatory Visit: Payer: Self-pay

## 2021-04-13 DIAGNOSIS — Z79899 Other long term (current) drug therapy: Secondary | ICD-10-CM | POA: Diagnosis not present

## 2021-04-13 DIAGNOSIS — Z5111 Encounter for antineoplastic chemotherapy: Secondary | ICD-10-CM | POA: Diagnosis not present

## 2021-04-13 DIAGNOSIS — C210 Malignant neoplasm of anus, unspecified: Secondary | ICD-10-CM | POA: Diagnosis not present

## 2021-04-13 DIAGNOSIS — R519 Headache, unspecified: Secondary | ICD-10-CM | POA: Diagnosis not present

## 2021-04-13 DIAGNOSIS — Z923 Personal history of irradiation: Secondary | ICD-10-CM | POA: Diagnosis not present

## 2021-04-13 DIAGNOSIS — R3915 Urgency of urination: Secondary | ICD-10-CM | POA: Diagnosis not present

## 2021-04-13 DIAGNOSIS — I4891 Unspecified atrial fibrillation: Secondary | ICD-10-CM | POA: Diagnosis not present

## 2021-04-13 DIAGNOSIS — E119 Type 2 diabetes mellitus without complications: Secondary | ICD-10-CM | POA: Diagnosis not present

## 2021-04-13 DIAGNOSIS — R197 Diarrhea, unspecified: Secondary | ICD-10-CM | POA: Diagnosis not present

## 2021-04-13 DIAGNOSIS — C774 Secondary and unspecified malignant neoplasm of inguinal and lower limb lymph nodes: Secondary | ICD-10-CM | POA: Diagnosis not present

## 2021-04-13 DIAGNOSIS — R0781 Pleurodynia: Secondary | ICD-10-CM | POA: Diagnosis not present

## 2021-04-13 DIAGNOSIS — E785 Hyperlipidemia, unspecified: Secondary | ICD-10-CM | POA: Diagnosis not present

## 2021-04-13 DIAGNOSIS — Z7901 Long term (current) use of anticoagulants: Secondary | ICD-10-CM | POA: Diagnosis not present

## 2021-04-13 DIAGNOSIS — I10 Essential (primary) hypertension: Secondary | ICD-10-CM | POA: Diagnosis not present

## 2021-04-13 DIAGNOSIS — Z510 Encounter for antineoplastic radiation therapy: Secondary | ICD-10-CM | POA: Diagnosis not present

## 2021-04-14 ENCOUNTER — Other Ambulatory Visit: Payer: Self-pay

## 2021-04-14 ENCOUNTER — Ambulatory Visit
Admission: RE | Admit: 2021-04-14 | Discharge: 2021-04-14 | Disposition: A | Discharge status: Home / Self Care | Payer: Medicare Other | Source: Ambulatory Visit | Attending: Radiation Oncology | Admitting: Radiation Oncology

## 2021-04-14 DIAGNOSIS — Z5111 Encounter for antineoplastic chemotherapy: Secondary | ICD-10-CM | POA: Diagnosis not present

## 2021-04-14 DIAGNOSIS — R0781 Pleurodynia: Secondary | ICD-10-CM | POA: Diagnosis not present

## 2021-04-14 DIAGNOSIS — C210 Malignant neoplasm of anus, unspecified: Secondary | ICD-10-CM

## 2021-04-14 DIAGNOSIS — I4891 Unspecified atrial fibrillation: Secondary | ICD-10-CM | POA: Diagnosis not present

## 2021-04-14 DIAGNOSIS — Z510 Encounter for antineoplastic radiation therapy: Secondary | ICD-10-CM | POA: Diagnosis not present

## 2021-04-14 DIAGNOSIS — Z7901 Long term (current) use of anticoagulants: Secondary | ICD-10-CM | POA: Diagnosis not present

## 2021-04-14 DIAGNOSIS — I10 Essential (primary) hypertension: Secondary | ICD-10-CM | POA: Diagnosis not present

## 2021-04-14 DIAGNOSIS — R3915 Urgency of urination: Secondary | ICD-10-CM | POA: Diagnosis not present

## 2021-04-14 DIAGNOSIS — E119 Type 2 diabetes mellitus without complications: Secondary | ICD-10-CM | POA: Diagnosis not present

## 2021-04-14 DIAGNOSIS — R519 Headache, unspecified: Secondary | ICD-10-CM | POA: Diagnosis not present

## 2021-04-14 DIAGNOSIS — Z923 Personal history of irradiation: Secondary | ICD-10-CM | POA: Diagnosis not present

## 2021-04-14 DIAGNOSIS — R197 Diarrhea, unspecified: Secondary | ICD-10-CM | POA: Diagnosis not present

## 2021-04-14 DIAGNOSIS — C774 Secondary and unspecified malignant neoplasm of inguinal and lower limb lymph nodes: Secondary | ICD-10-CM | POA: Diagnosis not present

## 2021-04-14 DIAGNOSIS — E785 Hyperlipidemia, unspecified: Secondary | ICD-10-CM | POA: Diagnosis not present

## 2021-04-14 DIAGNOSIS — Z79899 Other long term (current) drug therapy: Secondary | ICD-10-CM | POA: Diagnosis not present

## 2021-04-15 ENCOUNTER — Other Ambulatory Visit: Payer: Self-pay

## 2021-04-15 ENCOUNTER — Ambulatory Visit
Admission: RE | Admit: 2021-04-15 | Discharge: 2021-04-15 | Disposition: A | Discharge status: Home / Self Care | Payer: Medicare Other | Source: Ambulatory Visit | Attending: Radiation Oncology | Admitting: Radiation Oncology

## 2021-04-15 DIAGNOSIS — R3915 Urgency of urination: Secondary | ICD-10-CM | POA: Diagnosis not present

## 2021-04-15 DIAGNOSIS — Z923 Personal history of irradiation: Secondary | ICD-10-CM | POA: Diagnosis not present

## 2021-04-15 DIAGNOSIS — Z510 Encounter for antineoplastic radiation therapy: Secondary | ICD-10-CM | POA: Diagnosis not present

## 2021-04-15 DIAGNOSIS — I4891 Unspecified atrial fibrillation: Secondary | ICD-10-CM | POA: Diagnosis not present

## 2021-04-15 DIAGNOSIS — R519 Headache, unspecified: Secondary | ICD-10-CM | POA: Diagnosis not present

## 2021-04-15 DIAGNOSIS — E119 Type 2 diabetes mellitus without complications: Secondary | ICD-10-CM | POA: Diagnosis not present

## 2021-04-15 DIAGNOSIS — Z79899 Other long term (current) drug therapy: Secondary | ICD-10-CM | POA: Diagnosis not present

## 2021-04-15 DIAGNOSIS — R0781 Pleurodynia: Secondary | ICD-10-CM | POA: Diagnosis not present

## 2021-04-15 DIAGNOSIS — Z5111 Encounter for antineoplastic chemotherapy: Secondary | ICD-10-CM | POA: Diagnosis not present

## 2021-04-15 DIAGNOSIS — C774 Secondary and unspecified malignant neoplasm of inguinal and lower limb lymph nodes: Secondary | ICD-10-CM | POA: Diagnosis not present

## 2021-04-15 DIAGNOSIS — I10 Essential (primary) hypertension: Secondary | ICD-10-CM | POA: Diagnosis not present

## 2021-04-15 DIAGNOSIS — E785 Hyperlipidemia, unspecified: Secondary | ICD-10-CM | POA: Diagnosis not present

## 2021-04-15 DIAGNOSIS — R197 Diarrhea, unspecified: Secondary | ICD-10-CM | POA: Diagnosis not present

## 2021-04-15 DIAGNOSIS — Z7901 Long term (current) use of anticoagulants: Secondary | ICD-10-CM | POA: Diagnosis not present

## 2021-04-15 DIAGNOSIS — C210 Malignant neoplasm of anus, unspecified: Secondary | ICD-10-CM | POA: Diagnosis not present

## 2021-04-15 NOTE — Progress Notes (Signed)
?[place]   ?[phone] [phone]   ?Clinic Follow up Note  ? ?Patient Care Team: ?[name], MD as PCP - General (Family Medicine) ?Cameron Sprang, MD as Consulting Physician (Neurology) ?Truitt Merle, MD as Consulting Physician (Oncology) ?Earl Gala, Deliah Goody, RN as Sales executive (Oncology) ?04/16/2021 ? ?CHIEF COMPLAINT: Follow-up anal cancer ? ?SUMMARY OF ONCOLOGIC HISTORY: ?Oncology History  ?Metastatic squamous cell carcinoma involving lymph node with unknown primary site [facility])  ?02/06/2018 Imaging  ? Impression CT abdomen and pelvis ?1.  Enlarged left groin lymph node which is of uncertain etiology. This would be amenable to percutaneous sampling if deemed clinically appropriate. ?2.  Hepatic steatosis. Focal area of low-attenuation in the far lateral left hepatic lobe, not clearly seen on the prior exam. Consider liver ultrasound for further evaluation.  ?3.  Mild L1 compression deformity which is new. ?  ?02/28/2018 Pathology Results  ? Left groin lymph node, needle core biopsy: ?      -Metastatic squamous cell carcinoma, see comment ? ?P16 stain is strongly positive, suggestive of cervical or anogenital origin in this location. ?Immunohistochemical stains for CK5, p63, and pan cytokeratin are positive. ER is weakly positive. CK7 and CK20 are negative. ?  ?03/15/2018 Pathology Results  ? 1. Cervix, biopsy, 12 o'clock ?- BENIGN SQUAMOUS MUCOSA WITH INFLAMMATION. ?- NO DYSPLASIA OR MALIGNANCY. ?2. Endocervix, curettage ?- BENIGN SQUAMOUS EPITHELIUM. ?  ?03/23/2018 PET scan  ? 1. Isolated intensely hypermetabolic enlarged LEFT inguinal lymph node. No clear primary identified. ?2. No abnormal activity associated with the vagina or anorectal tissue. ?3. No hypermetabolic lymph nodes in the pelvis. ?4. Two adjacent nodules in the RIGHT upper lobe with very low metabolic activity. These are not favored primary malignancies. Recommend follow-up per Fleischner criteria.  Non-contrast chest CT at 6-12 months is recommended.  ?  ?07/05/2018 - 07/18/2018 Radiation Therapy  ? Left inguinal area/nodes treated to 30 Gy in 10 fractions ?  ?09/22/2020 Imaging  ? EXAM: CT ABDOMEN PELVIS W IV CONTRAST  ? ?IMPRESSION:  ?1. Multicystic enhancing right adnexal lesion with mild surrounding stranding. Given short interval since recent CT 3 weeks prior, this is more suspicious for acute findings such as torsion or infection, less likely neoplasm. Recommend endovaginal ultrasound for further evaluation and follow-up to resolution.  ?2. Other stable chronic findings.  ?  ?01/30/2021 Pathology Results  ? DIAGNOSIS  ? ?SMALL  TUFT OF TISSUE ABOVE ANAL VERGE, ENDOSCOPIC BIOPSIES:  ? - HIGH-GRADE SQUAMOUS INTRAEPITHELIAL LESION/ANAL INTRAEPITHELIAL NEOPLASIA 2-3 OUT OF 3 (HGSIL/AIN 2-3).  ? - SEE COMMENT.  ?(MPL002)  ? ?COMMENT  ?MORPHOLOGICALLY THERE IS AT LEAST TWO THIRDS THICKNESS INVOLVEMENT BY DYSPLASTIC EPITHELIUM WITH MID-LEVEL MITOSES SECONDARY HOWEVER DUE TO POOR ORIENTATION AND MORPHOLOGIC ASSESSMENT IS 2 SERVICE MUCOSA IS ONLY PRESENT IN FOCAL AREAS MORPHOLOGICALLY THIS REPRESENTS AT LEAST AIN 2 AND MOST LIKELY AIN 3.  IN EITHER RESPECT THE FINDINGS ARE CONSISTENT WITH HIGH-GRADE SQUAMOUS INTRAEPITHELIAL LESION WHICH IS ALSO CONFIRMED BY STRONGLY BLOCK LIKE P16 IMMUNOHISTOCHEMICAL  POSITIVITY.  ?  ?03/05/2021 Pathology Results  ? Final Diagnosis    ?Rectal mass, biopsy: ?-Invasive squamous cell carcinoma, moderately differentiated  ? ?Addendum 1    ?The carcinoma was analyzed by [facility] for DNA mismatch repair proteins.  The neoplasm retained nuclear expression of all four mismatch repair proteins, MLH1, PMS2, MSH2, MSH6.  Controls worked appropriately.  ? ?  ?Anal cancer ([place])  ?01/30/2021 Cancer Staging  ? Staging form: Anus, AJCC V9 ?- Clinical  stage from 01/30/2021: Stage I (cT1, cN0, cM0) - Signed by Truitt Merle, MD on 03/11/2021 ?Stage prefix: Initial diagnosis ?Histologic grade (G): G2 ?Histologic  grading system: 4 grade system ? ?  ?03/11/2021 Initial Diagnosis  ? Anal cancer [facility]) ?  ?03/30/2021 -  Chemotherapy  ? Patient is on Treatment Plan : ANUS Mitomycin D1,28 / 5FU D1-4, 28-31 q32d  ?   ? ? ?CURRENT THERAPY: Concurrent chemoradiation with 5-FU/mitomycin on weeks 1 and 5 ? ?INTERVAL HISTORY: Lorraine Gilbert for follow-up as scheduled, last seen by my colleague Cassie, [place] on 04/10/2021.  She is completing week 3 of treatment.  She has some burning in the rectum, was told to apply Aquaphor.  She is otherwise doing okay.  Appetite is lower, drinks 2 Ensure per day plus Gatorade and hydrates. She has lost a pound. She also has difficulty falling asleep, sometimes not until midnight or 1 am.  Activity level is good at home.  She may have diarrhea 3-8 episodes per day at the most, stops with imodium.  Denies blood in stool.  Denies fever, chills, cough, chest pain, dyspnea, leg edema, mucositis, nausea/vomiting, or any other complaints. ? ? ?MEDICAL HISTORY:  ?Past Medical History:  ?Diagnosis Date  ? A-fib ([place])   ? Abnormal heart rhythm   ? Allergic rhinitis   ? Anxiety   ? Asthma   ? Diabetes mellitus without complication ([place])   ? Type II  ? GERD (gastroesophageal reflux disease)   ? Headache   ? History of radiation therapy 07/05/2018-07/18/2018  ? left pelvis        Dr Gery Pray  ? Hypertension   ? Neuromuscular disorder ([place])   ? Siactic pain in right leg  ? Osteopenia   ? Rheumatoid arthritis ([place])   ? Rotator cuff tear arthropathy   ? Right  ? Vertigo   ? ? ?SURGICAL HISTORY: ?Past Surgical History:  ?Procedure Laterality Date  ? BIOPSY  12/13/2018  ? Procedure: BIOPSY;  Surgeon: Wonda Horner, MD;  Location: [facility];  Service: Endoscopy;;  ? CATARACT EXTRACTION    ? bilateral  ? COLONOSCOPY WITH PROPOFOL N/A 12/13/2018  ? Procedure: COLONOSCOPY WITH PROPOFOL;  Surgeon: Wonda Horner, MD;  Location: [facility] END[facility];  Service: Endoscopy;  Laterality: N/A;  ? ESOPHAGOGASTRODUODENOSCOPY N/A  12/13/2018  ? Procedure: ESOPHAGOGASTRODUODENOSCOPY (EGD);  Surgeon: Wonda Horner, MD;  Location: [facility];  Service: Endoscopy;  Laterality: N/A;  ? EYE SURGERY    ? POLYPECTOMY  12/13/2018  ? Procedure: POLYPECTOMY;  Surgeon: Wonda Horner, MD;  Location: [facility];  Service: Endoscopy;;  ? REVERSE SHOULDER ARTHROPLASTY Right 08/04/2017  ? REVERSE SHOULDER ARTHROPLASTY Right 08/04/2017  ? Procedure: RIGHT REVERSE SHOULDER ARTHROPLASTY;  Surgeon: Justice Britain, MD;  Location: [place];  Service: Orthopedics;  Laterality: Right;  ? TUBAL LIGATION    ? ? ?I have reviewed the social history and family history with the patient and they are unchanged from previous note. ? ?ALLERGIES:  is allergic to ibuprofen. ? ?MEDICATIONS:  ?Current Outpatient Medications  ?Medication Sig Dispense Refill  ? mirtazapine (REMERON) 7.5 MG tablet Take 1 tablet (7.5 mg total) by mouth at bedtime. 30 tablet 0  ? ACCU-CHEK AVIVA PLUS test strip 1 each as directed.    ? Accu-Chek Softclix Lancets lancets 1 each by Other route as directed.    ? ACETAMINOPHEN 8 HOUR PO Take 1 tablet by mouth as needed.    ? albuterol (PROVENTIL) (2.5 MG/3ML) 0.083%  nebulizer solution SMARTSIG:1 Vial(s) Via Nebulizer Every 4-6 Hours PRN    ? Alcohol Swabs (ALCOHOL WIPES) 70 % PADS DropSafe Alcohol Prep Pads    ? amiodarone (PACERONE) 100 MG tablet Take 100 mg by mouth daily.    ? Blood Glucose Calibration (ACCU-CHEK AVIVA) SOLN     ? Blood Glucose Monitoring Suppl (TRUE METRIX AIR GLUCOSE METER) w/Device KIT See admin instructions.    ? clopidogrel (PLAVIX) 75 MG tablet Take 75 mg by mouth daily.    ? diclofenac Sodium (VOLTAREN) 1 % GEL Apply topically.    ? ELIQUIS 2.5 MG TABS tablet Take 2.5 mg by mouth 2 (two) times daily.    ? escitalopram (LEXAPRO) 10 MG tablet Take 10 mg by mouth daily.    ? folic acid (FOLVITE) 1 MG tablet Take by mouth.    ? levalbuterol (XOPENEX) 1.25 MG/3ML nebulizer solution Inhale into the lungs.    ? nitroGLYCERIN  (NITROSTAT) 0.4 MG SL tablet Place 0.4 mg under the tongue every 5 (five) minutes as needed for chest pain. (Patient not taking: Reported on 03/30/2021)    ? ondansetron (ZOFRAN) 4 MG tablet Take 1 tablet (4 mg total) by mou

## 2021-04-16 ENCOUNTER — Encounter: Payer: Self-pay | Admitting: Nurse Practitioner

## 2021-04-16 ENCOUNTER — Ambulatory Visit
Admission: RE | Admit: 2021-04-16 | Discharge: 2021-04-16 | Disposition: A | Discharge status: Home / Self Care | Payer: Medicare Other | Source: Ambulatory Visit | Attending: Radiation Oncology | Admitting: Radiation Oncology

## 2021-04-16 ENCOUNTER — Inpatient Hospital Stay (HOSPITAL_BASED_OUTPATIENT_CLINIC_OR_DEPARTMENT_OTHER): Payer: Medicare Other | Admitting: Nurse Practitioner

## 2021-04-16 ENCOUNTER — Other Ambulatory Visit: Payer: Medicare Other

## 2021-04-16 ENCOUNTER — Inpatient Hospital Stay: Payer: Medicare Other

## 2021-04-16 VITALS — BP 158/54 | HR 72 | Temp 97.6°F | Wt 136.2 lb

## 2021-04-16 DIAGNOSIS — C774 Secondary and unspecified malignant neoplasm of inguinal and lower limb lymph nodes: Secondary | ICD-10-CM | POA: Diagnosis not present

## 2021-04-16 DIAGNOSIS — R197 Diarrhea, unspecified: Secondary | ICD-10-CM | POA: Diagnosis not present

## 2021-04-16 DIAGNOSIS — Z510 Encounter for antineoplastic radiation therapy: Secondary | ICD-10-CM | POA: Diagnosis not present

## 2021-04-16 DIAGNOSIS — Z79899 Other long term (current) drug therapy: Secondary | ICD-10-CM | POA: Diagnosis not present

## 2021-04-16 DIAGNOSIS — Z7901 Long term (current) use of anticoagulants: Secondary | ICD-10-CM | POA: Diagnosis not present

## 2021-04-16 DIAGNOSIS — I4891 Unspecified atrial fibrillation: Secondary | ICD-10-CM | POA: Diagnosis not present

## 2021-04-16 DIAGNOSIS — Z5111 Encounter for antineoplastic chemotherapy: Secondary | ICD-10-CM | POA: Diagnosis not present

## 2021-04-16 DIAGNOSIS — Z923 Personal history of irradiation: Secondary | ICD-10-CM | POA: Diagnosis not present

## 2021-04-16 DIAGNOSIS — I10 Essential (primary) hypertension: Secondary | ICD-10-CM | POA: Diagnosis not present

## 2021-04-16 DIAGNOSIS — R3915 Urgency of urination: Secondary | ICD-10-CM | POA: Diagnosis not present

## 2021-04-16 DIAGNOSIS — C210 Malignant neoplasm of anus, unspecified: Secondary | ICD-10-CM

## 2021-04-16 DIAGNOSIS — E119 Type 2 diabetes mellitus without complications: Secondary | ICD-10-CM | POA: Diagnosis not present

## 2021-04-16 DIAGNOSIS — R519 Headache, unspecified: Secondary | ICD-10-CM | POA: Diagnosis not present

## 2021-04-16 DIAGNOSIS — E785 Hyperlipidemia, unspecified: Secondary | ICD-10-CM | POA: Diagnosis not present

## 2021-04-16 DIAGNOSIS — R0781 Pleurodynia: Secondary | ICD-10-CM | POA: Diagnosis not present

## 2021-04-16 LAB — CBC WITH DIFFERENTIAL (CANCER CENTER ONLY)
Abs Immature Granulocytes: 0.06 10*3/uL (ref 0.00–0.07)
Basophils Absolute: 0 10*3/uL (ref 0.0–0.1)
Basophils Relative: 0 %
Eosinophils Absolute: 0.4 10*3/uL (ref 0.0–0.5)
Eosinophils Relative: 14 %
HCT: 34.9 % — ABNORMAL LOW (ref 36.0–46.0)
Hemoglobin: 11.3 g/dL — ABNORMAL LOW (ref 12.0–15.0)
Immature Granulocytes: 2 %
Lymphocytes Relative: 25 %
Lymphs Abs: 0.7 10*3/uL (ref 0.7–4.0)
MCH: 29.5 pg (ref 26.0–34.0)
MCHC: 32.4 g/dL (ref 30.0–36.0)
MCV: 91.1 fL (ref 80.0–100.0)
Monocytes Absolute: 0.5 10*3/uL (ref 0.1–1.0)
Monocytes Relative: 16 %
Neutro Abs: 1.2 10*3/uL — ABNORMAL LOW (ref 1.7–7.7)
Neutrophils Relative %: 43 %
Platelet Count: 133 10*3/uL — ABNORMAL LOW (ref 150–400)
RBC: 3.83 MIL/uL — ABNORMAL LOW (ref 3.87–5.11)
RDW: 14.4 % (ref 11.5–15.5)
WBC Count: 2.8 10*3/uL — ABNORMAL LOW (ref 4.0–10.5)
nRBC: 0 % (ref 0.0–0.2)

## 2021-04-16 LAB — CMP (CANCER CENTER ONLY)
ALT: 16 U/L (ref 0–44)
AST: 19 U/L (ref 15–41)
Albumin: 3.6 g/dL (ref 3.5–5.0)
Alkaline Phosphatase: 54 U/L (ref 38–126)
Anion gap: 7 (ref 5–15)
BUN: 9 mg/dL (ref 8–23)
CO2: 28 mmol/L (ref 22–32)
Calcium: 8.8 mg/dL — ABNORMAL LOW (ref 8.9–10.3)
Chloride: 107 mmol/L (ref 98–111)
Creatinine: 0.77 mg/dL (ref 0.44–1.00)
GFR, Estimated: >60 mL/min (ref >60)
Glucose, Bld: 118 mg/dL — ABNORMAL HIGH (ref 70–99)
Potassium: 4.6 mmol/L (ref 3.5–5.1)
Sodium: 142 mmol/L (ref 135–145)
Total Bilirubin: 0.4 mg/dL (ref 0.3–1.2)
Total Protein: 6.5 g/dL (ref 6.5–8.1)

## 2021-04-16 MED ORDER — MIRTAZAPINE 7.5 MG PO TABS: 7.5000 mg | ORAL_TABLET | Freq: Every day | ORAL | 0 refills | Status: DC

## 2021-04-17 ENCOUNTER — Ambulatory Visit
Admission: RE | Admit: 2021-04-17 | Discharge: 2021-04-17 | Disposition: A | Discharge status: Home / Self Care | Payer: Medicare Other | Source: Ambulatory Visit | Attending: Radiation Oncology | Admitting: Radiation Oncology

## 2021-04-17 ENCOUNTER — Other Ambulatory Visit: Payer: Self-pay

## 2021-04-17 DIAGNOSIS — Z923 Personal history of irradiation: Secondary | ICD-10-CM | POA: Diagnosis not present

## 2021-04-17 DIAGNOSIS — C210 Malignant neoplasm of anus, unspecified: Secondary | ICD-10-CM | POA: Diagnosis not present

## 2021-04-17 DIAGNOSIS — I10 Essential (primary) hypertension: Secondary | ICD-10-CM | POA: Diagnosis not present

## 2021-04-17 DIAGNOSIS — E119 Type 2 diabetes mellitus without complications: Secondary | ICD-10-CM | POA: Diagnosis not present

## 2021-04-17 DIAGNOSIS — Z510 Encounter for antineoplastic radiation therapy: Secondary | ICD-10-CM | POA: Diagnosis not present

## 2021-04-17 DIAGNOSIS — Z5111 Encounter for antineoplastic chemotherapy: Secondary | ICD-10-CM | POA: Diagnosis not present

## 2021-04-17 DIAGNOSIS — R197 Diarrhea, unspecified: Secondary | ICD-10-CM | POA: Diagnosis not present

## 2021-04-17 DIAGNOSIS — I4891 Unspecified atrial fibrillation: Secondary | ICD-10-CM | POA: Diagnosis not present

## 2021-04-17 DIAGNOSIS — C774 Secondary and unspecified malignant neoplasm of inguinal and lower limb lymph nodes: Secondary | ICD-10-CM | POA: Diagnosis not present

## 2021-04-17 DIAGNOSIS — R519 Headache, unspecified: Secondary | ICD-10-CM | POA: Diagnosis not present

## 2021-04-17 DIAGNOSIS — E785 Hyperlipidemia, unspecified: Secondary | ICD-10-CM | POA: Diagnosis not present

## 2021-04-17 DIAGNOSIS — Z79899 Other long term (current) drug therapy: Secondary | ICD-10-CM | POA: Diagnosis not present

## 2021-04-17 DIAGNOSIS — Z7901 Long term (current) use of anticoagulants: Secondary | ICD-10-CM | POA: Diagnosis not present

## 2021-04-17 DIAGNOSIS — R3915 Urgency of urination: Secondary | ICD-10-CM | POA: Diagnosis not present

## 2021-04-17 DIAGNOSIS — R0781 Pleurodynia: Secondary | ICD-10-CM | POA: Diagnosis not present

## 2021-04-20 ENCOUNTER — Ambulatory Visit
Admission: RE | Admit: 2021-04-20 | Discharge: 2021-04-20 | Disposition: A | Discharge status: Home / Self Care | Payer: Medicare Other | Source: Ambulatory Visit | Attending: Radiation Oncology | Admitting: Radiation Oncology

## 2021-04-20 ENCOUNTER — Inpatient Hospital Stay: Payer: Medicare Other

## 2021-04-20 ENCOUNTER — Other Ambulatory Visit: Payer: Self-pay

## 2021-04-20 DIAGNOSIS — I4891 Unspecified atrial fibrillation: Secondary | ICD-10-CM | POA: Insufficient documentation

## 2021-04-20 DIAGNOSIS — D61818 Other pancytopenia: Secondary | ICD-10-CM | POA: Insufficient documentation

## 2021-04-20 DIAGNOSIS — R300 Dysuria: Secondary | ICD-10-CM | POA: Insufficient documentation

## 2021-04-20 DIAGNOSIS — C210 Malignant neoplasm of anus, unspecified: Secondary | ICD-10-CM | POA: Insufficient documentation

## 2021-04-20 DIAGNOSIS — R197 Diarrhea, unspecified: Secondary | ICD-10-CM | POA: Diagnosis not present

## 2021-04-20 DIAGNOSIS — Z7901 Long term (current) use of anticoagulants: Secondary | ICD-10-CM | POA: Insufficient documentation

## 2021-04-20 DIAGNOSIS — R519 Headache, unspecified: Secondary | ICD-10-CM | POA: Diagnosis not present

## 2021-04-20 DIAGNOSIS — F32A Depression, unspecified: Secondary | ICD-10-CM | POA: Insufficient documentation

## 2021-04-20 DIAGNOSIS — E785 Hyperlipidemia, unspecified: Secondary | ICD-10-CM | POA: Insufficient documentation

## 2021-04-20 DIAGNOSIS — Z923 Personal history of irradiation: Secondary | ICD-10-CM | POA: Insufficient documentation

## 2021-04-20 DIAGNOSIS — R3915 Urgency of urination: Secondary | ICD-10-CM | POA: Insufficient documentation

## 2021-04-20 DIAGNOSIS — E119 Type 2 diabetes mellitus without complications: Secondary | ICD-10-CM | POA: Insufficient documentation

## 2021-04-20 DIAGNOSIS — C774 Secondary and unspecified malignant neoplasm of inguinal and lower limb lymph nodes: Secondary | ICD-10-CM | POA: Insufficient documentation

## 2021-04-20 DIAGNOSIS — Z5111 Encounter for antineoplastic chemotherapy: Secondary | ICD-10-CM | POA: Insufficient documentation

## 2021-04-20 DIAGNOSIS — Z79899 Other long term (current) drug therapy: Secondary | ICD-10-CM | POA: Insufficient documentation

## 2021-04-20 DIAGNOSIS — I10 Essential (primary) hypertension: Secondary | ICD-10-CM | POA: Insufficient documentation

## 2021-04-20 DIAGNOSIS — Z510 Encounter for antineoplastic radiation therapy: Secondary | ICD-10-CM | POA: Diagnosis not present

## 2021-04-20 DIAGNOSIS — R0781 Pleurodynia: Secondary | ICD-10-CM | POA: Diagnosis not present

## 2021-04-20 LAB — CBC WITH DIFFERENTIAL (CANCER CENTER ONLY)
Abs Immature Granulocytes: 0.13 10*3/uL — ABNORMAL HIGH (ref 0.00–0.07)
Basophils Absolute: 0 10*3/uL (ref 0.0–0.1)
Basophils Relative: 1 %
Eosinophils Absolute: 0.3 10*3/uL (ref 0.0–0.5)
Eosinophils Relative: 8 %
HCT: 35.3 % — ABNORMAL LOW (ref 36.0–46.0)
Hemoglobin: 11.4 g/dL — ABNORMAL LOW (ref 12.0–15.0)
Immature Granulocytes: 4 %
Lymphocytes Relative: 18 %
Lymphs Abs: 0.7 10*3/uL (ref 0.7–4.0)
MCH: 29.4 pg (ref 26.0–34.0)
MCHC: 32.3 g/dL (ref 30.0–36.0)
MCV: 91 fL (ref 80.0–100.0)
Monocytes Absolute: 0.6 10*3/uL (ref 0.1–1.0)
Monocytes Relative: 15 %
Neutro Abs: 2 10*3/uL (ref 1.7–7.7)
Neutrophils Relative %: 54 %
Platelet Count: 139 10*3/uL — ABNORMAL LOW (ref 150–400)
RBC: 3.88 MIL/uL (ref 3.87–5.11)
RDW: 14.8 % (ref 11.5–15.5)
WBC Count: 3.7 10*3/uL — ABNORMAL LOW (ref 4.0–10.5)
nRBC: 0 % (ref 0.0–0.2)

## 2021-04-20 LAB — CMP (CANCER CENTER ONLY)
ALT: 15 U/L (ref 0–44)
AST: 19 U/L (ref 15–41)
Albumin: 3.7 g/dL (ref 3.5–5.0)
Alkaline Phosphatase: 53 U/L (ref 38–126)
Anion gap: 4 — ABNORMAL LOW (ref 5–15)
BUN: 11 mg/dL (ref 8–23)
CO2: 30 mmol/L (ref 22–32)
Calcium: 8.9 mg/dL (ref 8.9–10.3)
Chloride: 107 mmol/L (ref 98–111)
Creatinine: 0.7 mg/dL (ref 0.44–1.00)
GFR, Estimated: >60 mL/min (ref >60)
Glucose, Bld: 117 mg/dL — ABNORMAL HIGH (ref 70–99)
Potassium: 4.9 mmol/L (ref 3.5–5.1)
Sodium: 141 mmol/L (ref 135–145)
Total Bilirubin: 0.3 mg/dL (ref 0.3–1.2)
Total Protein: 6.8 g/dL (ref 6.5–8.1)

## 2021-04-21 ENCOUNTER — Ambulatory Visit
Admission: RE | Admit: 2021-04-21 | Discharge: 2021-04-21 | Disposition: A | Discharge status: Home / Self Care | Payer: Medicare Other | Source: Ambulatory Visit | Attending: Radiation Oncology | Admitting: Radiation Oncology

## 2021-04-21 DIAGNOSIS — R519 Headache, unspecified: Secondary | ICD-10-CM | POA: Diagnosis not present

## 2021-04-21 DIAGNOSIS — C774 Secondary and unspecified malignant neoplasm of inguinal and lower limb lymph nodes: Secondary | ICD-10-CM | POA: Diagnosis not present

## 2021-04-21 DIAGNOSIS — C210 Malignant neoplasm of anus, unspecified: Secondary | ICD-10-CM | POA: Diagnosis not present

## 2021-04-21 DIAGNOSIS — R197 Diarrhea, unspecified: Secondary | ICD-10-CM | POA: Diagnosis not present

## 2021-04-21 DIAGNOSIS — Z7901 Long term (current) use of anticoagulants: Secondary | ICD-10-CM | POA: Diagnosis not present

## 2021-04-21 DIAGNOSIS — E119 Type 2 diabetes mellitus without complications: Secondary | ICD-10-CM | POA: Diagnosis not present

## 2021-04-21 DIAGNOSIS — I4891 Unspecified atrial fibrillation: Secondary | ICD-10-CM | POA: Diagnosis not present

## 2021-04-21 DIAGNOSIS — R300 Dysuria: Secondary | ICD-10-CM | POA: Diagnosis not present

## 2021-04-21 DIAGNOSIS — Z923 Personal history of irradiation: Secondary | ICD-10-CM | POA: Diagnosis not present

## 2021-04-21 DIAGNOSIS — D61818 Other pancytopenia: Secondary | ICD-10-CM | POA: Diagnosis not present

## 2021-04-21 DIAGNOSIS — Z510 Encounter for antineoplastic radiation therapy: Secondary | ICD-10-CM | POA: Diagnosis not present

## 2021-04-21 DIAGNOSIS — R0781 Pleurodynia: Secondary | ICD-10-CM | POA: Diagnosis not present

## 2021-04-21 DIAGNOSIS — I10 Essential (primary) hypertension: Secondary | ICD-10-CM | POA: Diagnosis not present

## 2021-04-21 DIAGNOSIS — Z5111 Encounter for antineoplastic chemotherapy: Secondary | ICD-10-CM | POA: Diagnosis not present

## 2021-04-21 DIAGNOSIS — F32A Depression, unspecified: Secondary | ICD-10-CM | POA: Diagnosis not present

## 2021-04-21 DIAGNOSIS — R3915 Urgency of urination: Secondary | ICD-10-CM | POA: Diagnosis not present

## 2021-04-21 DIAGNOSIS — E785 Hyperlipidemia, unspecified: Secondary | ICD-10-CM | POA: Diagnosis not present

## 2021-04-21 DIAGNOSIS — Z79899 Other long term (current) drug therapy: Secondary | ICD-10-CM | POA: Diagnosis not present

## 2021-04-21 NOTE — Progress Notes (Signed)
?[place]   ?[phone] [phone]   ?Clinic Follow up Note  ? ?Patient Care Team: ?[name], MD as PCP - General (Family Medicine) ?Cameron Sprang, MD as Consulting Physician (Neurology) ?Truitt Merle, MD as Consulting Physician (Oncology) ?Earl Gala, Deliah Goody, RN as Sales executive (Oncology) ?04/22/2021 ? ?CHIEF COMPLAINT: Follow-up of anal cancer ? ?SUMMARY OF ONCOLOGIC HISTORY: ?Oncology History  ?Metastatic squamous cell carcinoma involving lymph node with unknown primary site [facility])  ?02/06/2018 Imaging  ? Impression CT abdomen and pelvis ?1.  Enlarged left groin lymph node which is of uncertain etiology. This would be amenable to percutaneous sampling if deemed clinically appropriate. ?2.  Hepatic steatosis. Focal area of low-attenuation in the far lateral left hepatic lobe, not clearly seen on the prior exam. Consider liver ultrasound for further evaluation.  ?3.  Mild L1 compression deformity which is new. ?  ?02/28/2018 Pathology Results  ? Left groin lymph node, needle core biopsy: ?      -Metastatic squamous cell carcinoma, see comment ? ?P16 stain is strongly positive, suggestive of cervical or anogenital origin in this location. ?Immunohistochemical stains for CK5, p63, and pan cytokeratin are positive. ER is weakly positive. CK7 and CK20 are negative. ?  ?03/15/2018 Pathology Results  ? 1. Cervix, biopsy, 12 o'clock ?- BENIGN SQUAMOUS MUCOSA WITH INFLAMMATION. ?- NO DYSPLASIA OR MALIGNANCY. ?2. Endocervix, curettage ?- BENIGN SQUAMOUS EPITHELIUM. ?  ?03/23/2018 PET scan  ? 1. Isolated intensely hypermetabolic enlarged LEFT inguinal lymph node. No clear primary identified. ?2. No abnormal activity associated with the vagina or anorectal tissue. ?3. No hypermetabolic lymph nodes in the pelvis. ?4. Two adjacent nodules in the RIGHT upper lobe with very low metabolic activity. These are not favored primary malignancies. Recommend follow-up per Fleischner criteria.  Non-contrast chest CT at 6-12 months is recommended.  ?  ?07/05/2018 - 07/18/2018 Radiation Therapy  ? Left inguinal area/nodes treated to 30 Gy in 10 fractions ?  ?09/22/2020 Imaging  ? EXAM: CT ABDOMEN PELVIS W IV CONTRAST  ? ?IMPRESSION:  ?1. Multicystic enhancing right adnexal lesion with mild surrounding stranding. Given short interval since recent CT 3 weeks prior, this is more suspicious for acute findings such as torsion or infection, less likely neoplasm. Recommend endovaginal ultrasound for further evaluation and follow-up to resolution.  ?2. Other stable chronic findings.  ?  ?01/30/2021 Pathology Results  ? DIAGNOSIS  ? ?SMALL  TUFT OF TISSUE ABOVE ANAL VERGE, ENDOSCOPIC BIOPSIES:  ? - HIGH-GRADE SQUAMOUS INTRAEPITHELIAL LESION/ANAL INTRAEPITHELIAL NEOPLASIA 2-3 OUT OF 3 (HGSIL/AIN 2-3).  ? - SEE COMMENT.  ?(MPL002)  ? ?COMMENT  ?MORPHOLOGICALLY THERE IS AT LEAST TWO THIRDS THICKNESS INVOLVEMENT BY DYSPLASTIC EPITHELIUM WITH MID-LEVEL MITOSES SECONDARY HOWEVER DUE TO POOR ORIENTATION AND MORPHOLOGIC ASSESSMENT IS 2 SERVICE MUCOSA IS ONLY PRESENT IN FOCAL AREAS MORPHOLOGICALLY THIS REPRESENTS AT LEAST AIN 2 AND MOST LIKELY AIN 3.  IN EITHER RESPECT THE FINDINGS ARE CONSISTENT WITH HIGH-GRADE SQUAMOUS INTRAEPITHELIAL LESION WHICH IS ALSO CONFIRMED BY STRONGLY BLOCK LIKE P16 IMMUNOHISTOCHEMICAL  POSITIVITY.  ?  ?03/05/2021 Pathology Results  ? Final Diagnosis    ?Rectal mass, biopsy: ?-Invasive squamous cell carcinoma, moderately differentiated  ? ?Addendum 1    ?The carcinoma was analyzed by [facility] for DNA mismatch repair proteins.  The neoplasm retained nuclear expression of all four mismatch repair proteins, MLH1, PMS2, MSH2, MSH6.  Controls worked appropriately.  ? ?  ?Anal cancer ([place])  ?01/30/2021 Cancer Staging  ? Staging form: Anus, AJCC V9 ?-  Clinical stage from 01/30/2021: Stage I (cT1, cN0, cM0) - Signed by Truitt Merle, MD on 03/11/2021 ?Stage prefix: Initial diagnosis ?Histologic grade (G): G2 ?Histologic  grading system: 4 grade system ? ?  ?03/11/2021 Initial Diagnosis  ? Anal cancer [facility]) ?  ?03/30/2021 -  Chemotherapy  ? Patient is on Treatment Plan : ANUS Mitomycin D1,28 / 5FU D1-4, 28-31 q32d  ?   ? ? ?CURRENT THERAPY: Curative concurrent chemo RT with 5-FU/mitomycin on weeks 1 and 5, started 03/30/2021 ? ?INTERVAL HISTORY: Lorraine Gilbert returns for follow-up as scheduled, last seen by me 04/16/2021.  She had developed mild pancytopenia ANC 1.2.  Repeat lab 4/3 showed a normal ANC, mild thrombocytopenia platelet 139 and anemia with Hgb 11.4.  Today she presents with her daughter-in-law, doing well in general.  Diarrhea has improved, now periodic up to 2/day otherwise stools are soft.  She continues Imodium when needed which is effective.  Perianal skin itches and burns, but managed with Aquaphor.  She is eating and drinking well, denies mucositis or n/v.  She has not started mirtazapine yet due to reading package insert and not wanting to take an additional med for anxiety.  Denies fever, chills, or any other new specific complaints. ? ? ?MEDICAL HISTORY:  ?Past Medical History:  ?Diagnosis Date  ? A-fib ([place])   ? Abnormal heart rhythm   ? Allergic rhinitis   ? Anxiety   ? Asthma   ? Diabetes mellitus without complication ([place])   ? Type II  ? GERD (gastroesophageal reflux disease)   ? Headache   ? History of radiation therapy 07/05/2018-07/18/2018  ? left pelvis        Dr Gery Pray  ? Hypertension   ? Neuromuscular disorder ([place])   ? Siactic pain in right leg  ? Osteopenia   ? Rheumatoid arthritis ([place])   ? Rotator cuff tear arthropathy   ? Right  ? Vertigo   ? ? ?SURGICAL HISTORY: ?Past Surgical History:  ?Procedure Laterality Date  ? BIOPSY  12/13/2018  ? Procedure: BIOPSY;  Surgeon: Wonda Horner, MD;  Location: [facility];  Service: Endoscopy;;  ? CATARACT EXTRACTION    ? bilateral  ? COLONOSCOPY WITH PROPOFOL N/A 12/13/2018  ? Procedure: COLONOSCOPY WITH PROPOFOL;  Surgeon: Wonda Horner, MD;  Location: [facility]  ENDOSCOPY;  Service: Endoscopy;  Laterality: N/A;  ? ESOPHAGOGASTRODUODENOSCOPY N/A 12/13/2018  ? Procedure: ESOPHAGOGASTRODUODENOSCOPY (EGD);  Surgeon: Wonda Horner, MD;  Location: [facility] END[facility];  Service: Endoscopy;  Laterality: N/A;  ? EYE SURGERY    ? POLYPECTOMY  12/13/2018  ? Procedure: POLYPECTOMY;  Surgeon: Wonda Horner, MD;  Location: [facility];  Service: Endoscopy;;  ? REVERSE SHOULDER ARTHROPLASTY Right 08/04/2017  ? REVERSE SHOULDER ARTHROPLASTY Right 08/04/2017  ? Procedure: RIGHT REVERSE SHOULDER ARTHROPLASTY;  Surgeon: Justice Britain, MD;  Location: [facility];  Service: Orthopedics;  Laterality: Right;  ? TUBAL LIGATION    ? ? ?I have reviewed the social history and family history with the patient and they are unchanged from previous note. ? ?ALLERGIES:  is allergic to ibuprofen. ? ?MEDICATIONS:  ?Current Outpatient Medications  ?Medication Sig Dispense Refill  ? ACCU-CHEK AVIVA PLUS test strip 1 each as directed.    ? Accu-Chek Softclix Lancets lancets 1 each by Other route as directed.    ? ACETAMINOPHEN 8 HOUR PO Take 1 tablet by mouth as needed.    ? albuterol (PROVENTIL) (2.5 MG/3ML) 0.083% nebulizer solution SMARTSIG:1 Vial(s) Via Nebulizer Every 4-6 Hours  PRN    ? Alcohol Swabs (ALCOHOL WIPES) 70 % PADS DropSafe Alcohol Prep Pads    ? amiodarone (PACERONE) 100 MG tablet Take 100 mg by mouth daily.    ? Blood Glucose Calibration (ACCU-CHEK AVIVA) SOLN     ? Blood Glucose Monitoring Suppl (TRUE METRIX AIR GLUCOSE METER) w/Device KIT See admin instructions.    ? clopidogrel (PLAVIX) 75 MG tablet Take 75 mg by mouth daily.    ? diclofenac Sodium (VOLTAREN) 1 % GEL Apply topically.    ? ELIQUIS 2.5 MG TABS tablet Take 2.5 mg by mouth 2 (two) times daily.    ? escitalopram (LEXAPRO) 10 MG tablet Take 10 mg by mouth daily.    ? folic acid (FOLVITE) 1 MG tablet Take by mouth.    ? levalbuterol (XOPENEX) 1.25 MG/3ML nebulizer solution Inhale into the lungs.    ? mirtazapine (REMERON) 7.5 MG tablet Take  1 tablet (7.5 mg total) by mouth at bedtime. 30 tablet 0  ? nitroGLYCERIN (NITROSTAT) 0.4 MG SL tablet Place 0.4 mg under the tongue every 5 (five) minutes as needed for chest pain. (Patient not taking: Repo

## 2021-04-22 ENCOUNTER — Inpatient Hospital Stay (HOSPITAL_BASED_OUTPATIENT_CLINIC_OR_DEPARTMENT_OTHER): Payer: Medicare Other | Admitting: Nurse Practitioner

## 2021-04-22 ENCOUNTER — Other Ambulatory Visit: Payer: Medicare Other

## 2021-04-22 ENCOUNTER — Other Ambulatory Visit: Payer: Self-pay

## 2021-04-22 ENCOUNTER — Ambulatory Visit
Admission: RE | Admit: 2021-04-22 | Discharge: 2021-04-22 | Disposition: A | Discharge status: Home / Self Care | Payer: Medicare Other | Source: Ambulatory Visit | Attending: Radiation Oncology | Admitting: Radiation Oncology

## 2021-04-22 ENCOUNTER — Encounter: Payer: Self-pay | Admitting: Nurse Practitioner

## 2021-04-22 ENCOUNTER — Inpatient Hospital Stay: Payer: Medicare Other

## 2021-04-22 VITALS — BP 142/57 | HR 65 | Temp 98.1°F | Resp 16 | Wt 134.4 lb

## 2021-04-22 DIAGNOSIS — I4891 Unspecified atrial fibrillation: Secondary | ICD-10-CM | POA: Diagnosis not present

## 2021-04-22 DIAGNOSIS — Z5111 Encounter for antineoplastic chemotherapy: Secondary | ICD-10-CM | POA: Diagnosis not present

## 2021-04-22 DIAGNOSIS — Z923 Personal history of irradiation: Secondary | ICD-10-CM | POA: Diagnosis not present

## 2021-04-22 DIAGNOSIS — R0781 Pleurodynia: Secondary | ICD-10-CM | POA: Diagnosis not present

## 2021-04-22 DIAGNOSIS — Z7901 Long term (current) use of anticoagulants: Secondary | ICD-10-CM | POA: Diagnosis not present

## 2021-04-22 DIAGNOSIS — Z79899 Other long term (current) drug therapy: Secondary | ICD-10-CM | POA: Diagnosis not present

## 2021-04-22 DIAGNOSIS — R300 Dysuria: Secondary | ICD-10-CM | POA: Diagnosis not present

## 2021-04-22 DIAGNOSIS — E119 Type 2 diabetes mellitus without complications: Secondary | ICD-10-CM | POA: Diagnosis not present

## 2021-04-22 DIAGNOSIS — Z510 Encounter for antineoplastic radiation therapy: Secondary | ICD-10-CM | POA: Diagnosis not present

## 2021-04-22 DIAGNOSIS — R519 Headache, unspecified: Secondary | ICD-10-CM | POA: Diagnosis not present

## 2021-04-22 DIAGNOSIS — C210 Malignant neoplasm of anus, unspecified: Secondary | ICD-10-CM

## 2021-04-22 DIAGNOSIS — D61818 Other pancytopenia: Secondary | ICD-10-CM | POA: Diagnosis not present

## 2021-04-22 DIAGNOSIS — E785 Hyperlipidemia, unspecified: Secondary | ICD-10-CM | POA: Diagnosis not present

## 2021-04-22 DIAGNOSIS — R197 Diarrhea, unspecified: Secondary | ICD-10-CM | POA: Diagnosis not present

## 2021-04-22 DIAGNOSIS — C774 Secondary and unspecified malignant neoplasm of inguinal and lower limb lymph nodes: Secondary | ICD-10-CM | POA: Diagnosis not present

## 2021-04-22 DIAGNOSIS — I10 Essential (primary) hypertension: Secondary | ICD-10-CM | POA: Diagnosis not present

## 2021-04-22 DIAGNOSIS — R3915 Urgency of urination: Secondary | ICD-10-CM | POA: Diagnosis not present

## 2021-04-22 DIAGNOSIS — F32A Depression, unspecified: Secondary | ICD-10-CM | POA: Diagnosis not present

## 2021-04-23 ENCOUNTER — Telehealth: Payer: Self-pay | Admitting: Hematology

## 2021-04-23 ENCOUNTER — Ambulatory Visit
Admission: RE | Admit: 2021-04-23 | Discharge: 2021-04-23 | Disposition: A | Discharge status: Home / Self Care | Payer: Medicare Other | Source: Ambulatory Visit | Attending: Radiation Oncology | Admitting: Radiation Oncology

## 2021-04-23 DIAGNOSIS — C210 Malignant neoplasm of anus, unspecified: Secondary | ICD-10-CM | POA: Diagnosis not present

## 2021-04-23 DIAGNOSIS — R3915 Urgency of urination: Secondary | ICD-10-CM | POA: Diagnosis not present

## 2021-04-23 DIAGNOSIS — F32A Depression, unspecified: Secondary | ICD-10-CM | POA: Diagnosis not present

## 2021-04-23 DIAGNOSIS — I10 Essential (primary) hypertension: Secondary | ICD-10-CM | POA: Diagnosis not present

## 2021-04-23 DIAGNOSIS — R0781 Pleurodynia: Secondary | ICD-10-CM | POA: Diagnosis not present

## 2021-04-23 DIAGNOSIS — C774 Secondary and unspecified malignant neoplasm of inguinal and lower limb lymph nodes: Secondary | ICD-10-CM | POA: Diagnosis not present

## 2021-04-23 DIAGNOSIS — R519 Headache, unspecified: Secondary | ICD-10-CM | POA: Diagnosis not present

## 2021-04-23 DIAGNOSIS — R197 Diarrhea, unspecified: Secondary | ICD-10-CM | POA: Diagnosis not present

## 2021-04-23 DIAGNOSIS — D61818 Other pancytopenia: Secondary | ICD-10-CM | POA: Diagnosis not present

## 2021-04-23 DIAGNOSIS — E785 Hyperlipidemia, unspecified: Secondary | ICD-10-CM | POA: Diagnosis not present

## 2021-04-23 DIAGNOSIS — I4891 Unspecified atrial fibrillation: Secondary | ICD-10-CM | POA: Diagnosis not present

## 2021-04-23 DIAGNOSIS — Z7901 Long term (current) use of anticoagulants: Secondary | ICD-10-CM | POA: Diagnosis not present

## 2021-04-23 DIAGNOSIS — Z510 Encounter for antineoplastic radiation therapy: Secondary | ICD-10-CM | POA: Diagnosis not present

## 2021-04-23 DIAGNOSIS — Z923 Personal history of irradiation: Secondary | ICD-10-CM | POA: Diagnosis not present

## 2021-04-23 DIAGNOSIS — Z5111 Encounter for antineoplastic chemotherapy: Secondary | ICD-10-CM | POA: Diagnosis not present

## 2021-04-23 DIAGNOSIS — Z79899 Other long term (current) drug therapy: Secondary | ICD-10-CM | POA: Diagnosis not present

## 2021-04-23 DIAGNOSIS — R300 Dysuria: Secondary | ICD-10-CM | POA: Diagnosis not present

## 2021-04-23 DIAGNOSIS — E119 Type 2 diabetes mellitus without complications: Secondary | ICD-10-CM | POA: Diagnosis not present

## 2021-04-23 NOTE — Telephone Encounter (Signed)
Left message with follow-up appointment per 4/5 los. ?

## 2021-04-24 ENCOUNTER — Ambulatory Visit
Admission: RE | Admit: 2021-04-24 | Discharge: 2021-04-24 | Disposition: A | Discharge status: Home / Self Care | Payer: Medicare Other | Source: Ambulatory Visit | Attending: Radiation Oncology | Admitting: Radiation Oncology

## 2021-04-24 ENCOUNTER — Other Ambulatory Visit: Payer: Self-pay

## 2021-04-24 DIAGNOSIS — Z79899 Other long term (current) drug therapy: Secondary | ICD-10-CM | POA: Diagnosis not present

## 2021-04-24 DIAGNOSIS — R197 Diarrhea, unspecified: Secondary | ICD-10-CM | POA: Diagnosis not present

## 2021-04-24 DIAGNOSIS — E785 Hyperlipidemia, unspecified: Secondary | ICD-10-CM | POA: Diagnosis not present

## 2021-04-24 DIAGNOSIS — C774 Secondary and unspecified malignant neoplasm of inguinal and lower limb lymph nodes: Secondary | ICD-10-CM | POA: Diagnosis not present

## 2021-04-24 DIAGNOSIS — Z510 Encounter for antineoplastic radiation therapy: Secondary | ICD-10-CM | POA: Diagnosis not present

## 2021-04-24 DIAGNOSIS — R300 Dysuria: Secondary | ICD-10-CM | POA: Diagnosis not present

## 2021-04-24 DIAGNOSIS — F32A Depression, unspecified: Secondary | ICD-10-CM | POA: Diagnosis not present

## 2021-04-24 DIAGNOSIS — E119 Type 2 diabetes mellitus without complications: Secondary | ICD-10-CM | POA: Diagnosis not present

## 2021-04-24 DIAGNOSIS — Z5111 Encounter for antineoplastic chemotherapy: Secondary | ICD-10-CM | POA: Diagnosis not present

## 2021-04-24 DIAGNOSIS — I4891 Unspecified atrial fibrillation: Secondary | ICD-10-CM | POA: Diagnosis not present

## 2021-04-24 DIAGNOSIS — R519 Headache, unspecified: Secondary | ICD-10-CM | POA: Diagnosis not present

## 2021-04-24 DIAGNOSIS — I10 Essential (primary) hypertension: Secondary | ICD-10-CM | POA: Diagnosis not present

## 2021-04-24 DIAGNOSIS — Z923 Personal history of irradiation: Secondary | ICD-10-CM | POA: Diagnosis not present

## 2021-04-24 DIAGNOSIS — Z7901 Long term (current) use of anticoagulants: Secondary | ICD-10-CM | POA: Diagnosis not present

## 2021-04-24 DIAGNOSIS — J45909 Unspecified asthma, uncomplicated: Secondary | ICD-10-CM | POA: Diagnosis not present

## 2021-04-24 DIAGNOSIS — R0781 Pleurodynia: Secondary | ICD-10-CM | POA: Diagnosis not present

## 2021-04-24 DIAGNOSIS — R3915 Urgency of urination: Secondary | ICD-10-CM | POA: Diagnosis not present

## 2021-04-24 DIAGNOSIS — C210 Malignant neoplasm of anus, unspecified: Secondary | ICD-10-CM | POA: Diagnosis not present

## 2021-04-24 DIAGNOSIS — D61818 Other pancytopenia: Secondary | ICD-10-CM | POA: Diagnosis not present

## 2021-04-27 ENCOUNTER — Ambulatory Visit (HOSPITAL_COMMUNITY)
Admission: RE | Admit: 2021-04-27 | Discharge: 2021-04-27 | Disposition: A | Discharge status: Home / Self Care | Payer: Medicare Other | Source: Ambulatory Visit | Attending: Physician Assistant | Admitting: Physician Assistant

## 2021-04-27 ENCOUNTER — Encounter: Payer: Self-pay | Admitting: Hematology

## 2021-04-27 ENCOUNTER — Ambulatory Visit: Admission: RE | Admit: 2021-04-27 | Payer: Medicare Other | Source: Ambulatory Visit

## 2021-04-27 ENCOUNTER — Other Ambulatory Visit: Payer: Self-pay | Admitting: Radiation Oncology

## 2021-04-27 ENCOUNTER — Inpatient Hospital Stay: Payer: Medicare Other | Admitting: Hematology

## 2021-04-27 ENCOUNTER — Other Ambulatory Visit: Payer: Self-pay

## 2021-04-27 ENCOUNTER — Inpatient Hospital Stay: Payer: Medicare Other

## 2021-04-27 VITALS — BP 151/45 | HR 63 | Temp 97.7°F | Resp 17 | Ht 62.0 in | Wt 135.1 lb

## 2021-04-27 DIAGNOSIS — R0781 Pleurodynia: Secondary | ICD-10-CM | POA: Diagnosis not present

## 2021-04-27 DIAGNOSIS — Z452 Encounter for adjustment and management of vascular access device: Secondary | ICD-10-CM | POA: Diagnosis not present

## 2021-04-27 DIAGNOSIS — Z79899 Other long term (current) drug therapy: Secondary | ICD-10-CM | POA: Diagnosis not present

## 2021-04-27 DIAGNOSIS — E785 Hyperlipidemia, unspecified: Secondary | ICD-10-CM | POA: Diagnosis not present

## 2021-04-27 DIAGNOSIS — Z923 Personal history of irradiation: Secondary | ICD-10-CM | POA: Diagnosis not present

## 2021-04-27 DIAGNOSIS — I10 Essential (primary) hypertension: Secondary | ICD-10-CM | POA: Diagnosis not present

## 2021-04-27 DIAGNOSIS — R519 Headache, unspecified: Secondary | ICD-10-CM | POA: Diagnosis not present

## 2021-04-27 DIAGNOSIS — D61818 Other pancytopenia: Secondary | ICD-10-CM | POA: Diagnosis not present

## 2021-04-27 DIAGNOSIS — C210 Malignant neoplasm of anus, unspecified: Secondary | ICD-10-CM

## 2021-04-27 DIAGNOSIS — R300 Dysuria: Secondary | ICD-10-CM | POA: Diagnosis not present

## 2021-04-27 DIAGNOSIS — R3915 Urgency of urination: Secondary | ICD-10-CM | POA: Diagnosis not present

## 2021-04-27 DIAGNOSIS — Z7901 Long term (current) use of anticoagulants: Secondary | ICD-10-CM | POA: Diagnosis not present

## 2021-04-27 DIAGNOSIS — R197 Diarrhea, unspecified: Secondary | ICD-10-CM | POA: Diagnosis not present

## 2021-04-27 DIAGNOSIS — Z5111 Encounter for antineoplastic chemotherapy: Secondary | ICD-10-CM | POA: Diagnosis not present

## 2021-04-27 DIAGNOSIS — I4891 Unspecified atrial fibrillation: Secondary | ICD-10-CM | POA: Diagnosis not present

## 2021-04-27 DIAGNOSIS — Z510 Encounter for antineoplastic radiation therapy: Secondary | ICD-10-CM | POA: Diagnosis not present

## 2021-04-27 DIAGNOSIS — F32A Depression, unspecified: Secondary | ICD-10-CM | POA: Diagnosis not present

## 2021-04-27 DIAGNOSIS — E119 Type 2 diabetes mellitus without complications: Secondary | ICD-10-CM | POA: Diagnosis not present

## 2021-04-27 DIAGNOSIS — C774 Secondary and unspecified malignant neoplasm of inguinal and lower limb lymph nodes: Secondary | ICD-10-CM | POA: Diagnosis not present

## 2021-04-27 LAB — CBC WITH DIFFERENTIAL (CANCER CENTER ONLY)
Abs Immature Granulocytes: 0.02 10*3/uL (ref 0.00–0.07)
Basophils Absolute: 0 10*3/uL (ref 0.0–0.1)
Basophils Relative: 0 %
Eosinophils Absolute: 0.3 10*3/uL (ref 0.0–0.5)
Eosinophils Relative: 6 %
HCT: 34.7 % — ABNORMAL LOW (ref 36.0–46.0)
Hemoglobin: 11.2 g/dL — ABNORMAL LOW (ref 12.0–15.0)
Immature Granulocytes: 0 %
Lymphocytes Relative: 13 %
Lymphs Abs: 0.6 10*3/uL — ABNORMAL LOW (ref 0.7–4.0)
MCH: 29.4 pg (ref 26.0–34.0)
MCHC: 32.3 g/dL (ref 30.0–36.0)
MCV: 91.1 fL (ref 80.0–100.0)
Monocytes Absolute: 0.5 10*3/uL (ref 0.1–1.0)
Monocytes Relative: 11 %
Neutro Abs: 3.2 10*3/uL (ref 1.7–7.7)
Neutrophils Relative %: 70 %
Platelet Count: 175 10*3/uL (ref 150–400)
RBC: 3.81 MIL/uL — ABNORMAL LOW (ref 3.87–5.11)
RDW: 15.6 % — ABNORMAL HIGH (ref 11.5–15.5)
WBC Count: 4.7 10*3/uL (ref 4.0–10.5)
nRBC: 0 % (ref 0.0–0.2)

## 2021-04-27 LAB — CMP (CANCER CENTER ONLY)
ALT: 17 U/L (ref 0–44)
AST: 21 U/L (ref 15–41)
Albumin: 3.8 g/dL (ref 3.5–5.0)
Alkaline Phosphatase: 57 U/L (ref 38–126)
Anion gap: 5 (ref 5–15)
BUN: 16 mg/dL (ref 8–23)
CO2: 26 mmol/L (ref 22–32)
Calcium: 9 mg/dL (ref 8.9–10.3)
Chloride: 108 mmol/L (ref 98–111)
Creatinine: 0.83 mg/dL (ref 0.44–1.00)
GFR, Estimated: >60 mL/min (ref >60)
Glucose, Bld: 115 mg/dL — ABNORMAL HIGH (ref 70–99)
Potassium: 4.3 mmol/L (ref 3.5–5.1)
Sodium: 139 mmol/L (ref 135–145)
Total Bilirubin: 0.4 mg/dL (ref 0.3–1.2)
Total Protein: 6.9 g/dL (ref 6.5–8.1)

## 2021-04-27 MED ORDER — SODIUM CHLORIDE 0.9 % IV SOLN
900.0000 mg/m2/d | INTRAVENOUS | Status: DC
  Administered 2021-04-27: 6000 mg via INTRAVENOUS
  Filled 2021-04-27: qty 120

## 2021-04-27 MED ORDER — HYDROCORTISONE ACETATE 25 MG RE SUPP: 25.0000 mg | Freq: Two times a day (BID) | RECTAL | 0 refills | Status: DC

## 2021-04-27 MED ORDER — LIDOCAINE HCL 1 % IJ SOLN
INTRAMUSCULAR | Status: DC | PRN
  Administered 2021-04-27: 5 mL via INTRADERMAL

## 2021-04-27 MED ORDER — SODIUM CHLORIDE 0.9 % IV SOLN: Freq: Once | INTRAVENOUS | Status: AC

## 2021-04-27 MED ORDER — LIDOCAINE HCL 1 % IJ SOLN
INTRAMUSCULAR | Status: AC
  Filled 2021-04-27: qty 20

## 2021-04-27 MED ORDER — MITOMYCIN CHEMO IV INJECTION 20 MG
8.0000 mg/m2 | Freq: Once | INTRAVENOUS | Status: AC
  Administered 2021-04-27: 13.5 mg via INTRAVENOUS
  Filled 2021-04-27: qty 27

## 2021-04-27 MED ORDER — SODIUM CHLORIDE 0.9% FLUSH
10.0000 mL | INTRAVENOUS | Status: DC | PRN
  Administered 2021-04-27: 10 mL via INTRAVENOUS

## 2021-04-27 MED ORDER — PROCHLORPERAZINE MALEATE 10 MG PO TABS
10.0000 mg | ORAL_TABLET | Freq: Once | ORAL | Status: AC
  Administered 2021-04-27: 10 mg via ORAL
  Filled 2021-04-27: qty 1

## 2021-04-27 NOTE — Patient Instructions (Signed)
[facility]  ? Discharge Instructions: ?Thank you for choosing [facility] to provide your oncology and hematology care.  ? ?If you have a lab appointment with the [facility], please go directly to the [facility] and check in at the registration area. ?  ?Wear comfortable clothing and clothing appropriate for easy access to any Portacath or PICC line.  ? ?We strive to give you quality time with your provider. You may need to reschedule your appointment if you arrive late (15 or more minutes).  Arriving late affects you and other patients whose appointments are after yours.  Also, if you miss three or more appointments without notifying the office, you may be dismissed from the clinic at the provider?s discretion.    ?  ?For prescription refill requests, have your pharmacy contact our office and allow 72 hours for refills to be completed.   ? ?Today you received the following chemotherapy and/or immunotherapy agents: mitomycin and fluorouracil    ?  ?To help prevent nausea and vomiting after your treatment, we encourage you to take your nausea medication as directed. ? ?BELOW ARE SYMPTOMS THAT SHOULD BE REPORTED IMMEDIATELY: ?*FEVER GREATER THAN 100.4 F (38 ?C) OR HIGHER ?*CHILLS OR SWEATING ?*NAUSEA AND VOMITING THAT IS NOT CONTROLLED WITH YOUR NAUSEA MEDICATION ?*UNUSUAL SHORTNESS OF BREATH ?*UNUSUAL BRUISING OR BLEEDING ?*URINARY PROBLEMS (pain or burning when urinating, or frequent urination) ?*BOWEL PROBLEMS (unusual diarrhea, constipation, pain near the anus) ?TENDERNESS IN MOUTH AND THROAT WITH OR WITHOUT PRESENCE OF ULCERS (sore throat, sores in mouth, or a toothache) ?UNUSUAL RASH, SWELLING OR PAIN  ?UNUSUAL VAGINAL DISCHARGE OR ITCHING  ? ?Items with * indicate a potential emergency and should be followed up as soon as possible or go to the Emergency Department if any problems should occur. ? ?Please show the CHEMOTHERAPY ALERT CARD or IMMUNOTHERAPY ALERT  CARD at check-in to the Emergency Department and triage nurse. ? ?Should you have questions after your visit or need to cancel or reschedule your appointment, please contact [facility]  Dept: [phone]  and follow the prompts.  Office hours are 8:00 a.m. to 4:30 p.m. Monday - Friday. Please note that voicemails left after 4:00 p.m. may not be returned until the following business day.  We are closed weekends and major holidays. You have access to a nurse at all times for urgent questions. Please call the main number to the clinic Dept: [phone] and follow the prompts. ? ? ?For any non-urgent questions, you may also contact your provider using MyChart. We now offer e-Visits for anyone 63 and older to request care online for non-urgent symptoms. For details visit [email or web address]. ?  ?Also download the MyChart app! Go to the app store, search "MyChart", open the app, select [facility], and log in with your MyChart username and password. ? ?Due to Covid, a mask is required upon entering the hospital/clinic. If you do not have a mask, one will be given to you upon arrival. For doctor visits, patients may have 1 support person aged 36 or older with them. For treatment visits, patients cannot have anyone with them due to current Covid guidelines and our immunocompromised population.  ? ?Mitomycin injection ?What is this medication? ?MITOMYCIN (mye toe MYE sin) is a chemotherapy drug. This medicine is used to treat cancer of the stomach and pancreas. ?This medicine may be used for other purposes; ask your health care provider or pharmacist if you have questions. ?  COMMON BRAND NAME(S): Mutamycin ?What should I tell my care team before I take this medication? ?They need to know if you have any of these conditions: ?bleeding disorders ?infection (especially a viral infection such as chickenpox, cold sores, or herpes) ?low blood counts, like white cells, platelets, or red blood  cells ?kidney disease ?an unusual or allergic reaction to mitomycin, other medicines, foods, dyes, or preservatives ?pregnant or trying to get pregnant ?breast-feeding ?How should I use this medication? ?This drug is given as an injection or infusion into a vein. It is administered in a hospital or clinic by a specially trained health care professional. ?Talk to your pediatrician regarding the use of this medicine in children. Special care may be needed. ?Overdosage: If you think you have taken too much of this medicine contact a poison control center or emergency room at once. ?NOTE: This medicine is only for you. Do not share this medicine with others. ?What if I miss a dose? ?It is important not to miss your dose. Call your doctor or health care professional if you are unable to keep an appointment. ?What may interact with this medication? ?Interactions are not expected. ?This list may not describe all possible interactions. Give your health care provider a list of all the medicines, herbs, non-prescription drugs, or dietary supplements you use. Also tell them if you smoke, drink alcohol, or use illegal drugs. Some items may interact with your medicine. ?What should I watch for while using this medication? ?Your condition will be monitored carefully while you are receiving this medicine. You will need important blood work done while you are taking this medicine. ?This drug may make you feel generally unwell. This is not uncommon, as chemotherapy can affect healthy cells as well as cancer cells. Report any side effects. Continue your course of treatment even though you feel ill unless your doctor tells you to stop. ?Call your doctor or health care professional for advice if you get a fever, chills or sore throat, or other symptoms of a cold or flu. Do not treat yourself. This drug decreases your body's ability to fight infections. Try to avoid being around people who are sick. ?This medicine may increase your risk  to bruise or bleed. Call your doctor or health care professional if you notice any unusual bleeding. ?Be careful brushing and flossing your teeth or using a toothpick because you may get an infection or bleed more easily. If you have any dental work done, tell your dentist you are receiving this medicine. ?Avoid taking products that contain aspirin, acetaminophen, ibuprofen, naproxen, or ketoprofen unless instructed by your doctor. These medicines may hide a fever. ?Do not become pregnant while taking this medicine. Women should inform their doctor if they wish to become pregnant or think they might be pregnant. There is a potential for serious side effects to an unborn child. Talk to your health care professional or pharmacist for more information. Do not breast-feed an infant while taking this medicine. ?What side effects may I notice from receiving this medication? ?Side effects that you should report to your doctor or health care professional as soon as possible: ?allergic reactions like skin rash, itching or hives, swelling of the face, lips, or tongue ?breathing problems ?pain, redness, or irritation at site where injected ?signs and symptoms of bleeding such as bloody or black, tarry stools; red or dark brown urine; spitting up blood or brown material that looks like coffee grounds; red spots on the skin; unusual bruising or bleeding  from the eyes, gums, or nose ?signs and symptoms of infection like fever; chills; cough; sore throat; pain or trouble passing urine ?signs and symptoms of kidney injury like trouble passing urine or change in the amount of urine ?signs and symptoms of low red blood cells or anemia such as unusually weak or tired; feeling faint or lightheaded; falls; breathing problems ?Side effects that usually do not require medical attention (report to your doctor or health care professional if they continue or are bothersome): ?green to blue color of urine ?hair loss ?loss of appetite ?mouth  sores ?nausea, vomiting ?This list may not describe all possible side effects. Call your doctor for medical advice about side effects. You may report side effects to FDA at 1-800-FDA-1088. ?Where should I

## 2021-04-27 NOTE — Progress Notes (Signed)
?[place]   ?[phone] [phone]   ?Clinic Follow up Note  ? ?Patient Care Team: ?[name], MD as PCP - General (Family Medicine) ?Cameron Sprang, MD as Consulting Physician (Neurology) ?Truitt Merle, MD as Consulting Physician (Oncology) ?Earl Gala, Deliah Goody, RN as Sales executive (Oncology) ? ?Date of Service:  04/27/2021 ? ?CHIEF COMPLAINT: f/u of anal cancer ? ?CURRENT THERAPY:  ?Concurrent chemo RT with 5-FU/mitomycin on weeks 1 and 5, started 03/30/21 ? ?ASSESSMENT & PLAN:  ?Lorraine Gilbert is a 88 y.o. female with  ? ?1. Anal Squamous Cell Carcinoma, cT1N0M0 ?-initially presented with palpable left groin lymph node in 01/2018. Biopsy on 02/27/18 showed metastatic squamous cell carcinoma, unknown primary. PET scan 03/23/18 also didn't show a primary. Colonoscopy and GYN work up were negative for malignancy  ?-she was treated with radiation therapy under Dr. Sondra Come 6/17-6/30/20. ?-she presented back with constipation in 09/2020. Rectal exam showed a questionable abnormality. Sigmoidoscopy on 01/30/21 with Dr. Percell Miller showed a small tuft of tissue above the anal verge. Pathology showed high-grade squamous intraepithelial lesion/anal intraepithelial neoplasia. ?-she was referred to Dr. Hilliard Clark and underwent anal biopsy in the OR on 03/05/21. Per OR note, ulcerated lesion in left posterolateral anal canal extending from anal verge to roughly 1.5cm proximal. Pathology from rectal mass confirmed invasive squamous cell carcinoma, moderately differentiated, MMR normal. ?-PET scan 03/13/21 was negative for metastasis ?-She began curative intent concurrent chemoradiation with dose reduced (for age) 5-FU and mitomycin (on weeks 1 and 5) on 03/30/21.  She overall tolerated chemotherapy well. ?-labs reviewed, CBC is stable to improved. Adequate to proceed with day 1 chemo today. She had PICC line placed this morning. We reviewed symptom management for the coming week. ?  ?2. chemoRT  toxicities: diarrhea, skin toxicity ?-diarrhea/loose stool, currently twice a day ?-skin is intact but close to breaking down. I advised her to use moisturizing cream. ?-she has mirtazapine to use as needed. ?  ?3.  Hypertension, hyperlipidemia, atrial fibrillation, DM (not on meds), depression ?-on lexapro 10 mg.  ?-f/u with PCP and will monitor her close on chemoRT ?  ? ?Plan: ?-proceed with C2D1 5-FU/mitomycin, I will slightly increase her 5-FU dose  ?-Continue RT ?-lab and f/u between 4/17-4/19 and 4/26-5/3 ? ? ?No problem-specific Assessment & Plan notes found for this encounter. ? ? ?SUMMARY OF ONCOLOGIC HISTORY: ?Oncology History  ?Metastatic squamous cell carcinoma involving lymph node with unknown primary site [facility])  ?02/06/2018 Imaging  ? Impression CT abdomen and pelvis ?1.  Enlarged left groin lymph node which is of uncertain etiology. This would be amenable to percutaneous sampling if deemed clinically appropriate. ?2.  Hepatic steatosis. Focal area of low-attenuation in the far lateral left hepatic lobe, not clearly seen on the prior exam. Consider liver ultrasound for further evaluation.  ?3.  Mild L1 compression deformity which is new. ?  ?02/28/2018 Pathology Results  ? Left groin lymph node, needle core biopsy: ?      -Metastatic squamous cell carcinoma, see comment ? ?P16 stain is strongly positive, suggestive of cervical or anogenital origin in this location. ?Immunohistochemical stains for CK5, p63, and pan cytokeratin are positive. ER is weakly positive. CK7 and CK20 are negative. ?  ?03/15/2018 Pathology Results  ? 1. Cervix, biopsy, 12 o'clock ?- BENIGN SQUAMOUS MUCOSA WITH INFLAMMATION. ?- NO DYSPLASIA OR MALIGNANCY. ?2. Endocervix, curettage ?- BENIGN SQUAMOUS EPITHELIUM. ?  ?03/23/2018 PET scan  ? 1. Isolated intensely hypermetabolic enlarged LEFT inguinal lymph node. No  clear primary identified. ?2. No abnormal activity associated with the vagina or anorectal tissue. ?3. No hypermetabolic  lymph nodes in the pelvis. ?4. Two adjacent nodules in the RIGHT upper lobe with very low metabolic activity. These are not favored primary malignancies. Recommend follow-up per Fleischner criteria. Non-contrast chest CT at 6-12 months is recommended.  ?  ?07/05/2018 - 07/18/2018 Radiation Therapy  ? Left inguinal area/nodes treated to 30 Gy in 10 fractions ?  ?09/22/2020 Imaging  ? EXAM: CT ABDOMEN PELVIS W IV CONTRAST  ? ?IMPRESSION:  ?1. Multicystic enhancing right adnexal lesion with mild surrounding stranding. Given short interval since recent CT 3 weeks prior, this is more suspicious for acute findings such as torsion or infection, less likely neoplasm. Recommend endovaginal ultrasound for further evaluation and follow-up to resolution.  ?2. Other stable chronic findings.  ?  ?01/30/2021 Pathology Results  ? DIAGNOSIS  ? ?SMALL  TUFT OF TISSUE ABOVE ANAL VERGE, ENDOSCOPIC BIOPSIES:  ? - HIGH-GRADE SQUAMOUS INTRAEPITHELIAL LESION/ANAL INTRAEPITHELIAL NEOPLASIA 2-3 OUT OF 3 (HGSIL/AIN 2-3).  ? - SEE COMMENT.  ?(MPL002)  ? ?COMMENT  ?MORPHOLOGICALLY THERE IS AT LEAST TWO THIRDS THICKNESS INVOLVEMENT BY DYSPLASTIC EPITHELIUM WITH MID-LEVEL MITOSES SECONDARY HOWEVER DUE TO POOR ORIENTATION AND MORPHOLOGIC ASSESSMENT IS 2 SERVICE MUCOSA IS ONLY PRESENT IN FOCAL AREAS MORPHOLOGICALLY THIS REPRESENTS AT LEAST AIN 2 AND MOST LIKELY AIN 3.  IN EITHER RESPECT THE FINDINGS ARE CONSISTENT WITH HIGH-GRADE SQUAMOUS INTRAEPITHELIAL LESION WHICH IS ALSO CONFIRMED BY STRONGLY BLOCK LIKE P16 IMMUNOHISTOCHEMICAL  POSITIVITY.  ?  ?03/05/2021 Pathology Results  ? Final Diagnosis    ?Rectal mass, biopsy: ?-Invasive squamous cell carcinoma, moderately differentiated  ? ?Addendum 1    ?The carcinoma was analyzed by [facility] for DNA mismatch repair proteins.  The neoplasm retained nuclear expression of all four mismatch repair proteins, MLH1, PMS2, MSH2, MSH6.  Controls worked appropriately.  ? ?  ?Anal cancer ([place])  ?01/30/2021 Cancer Staging  ?  Staging form: Anus, AJCC V9 ?- Clinical stage from 01/30/2021: Stage I (cT1, cN0, cM0) - Signed by Truitt Merle, MD on 03/11/2021 ?Stage prefix: Initial diagnosis ?Histologic grade (G): G2 ?Histologic grading system: 4 grade system ? ?  ?03/11/2021 Initial Diagnosis  ? Anal cancer [facility]) ?  ?03/30/2021 -  Chemotherapy  ? Patient is on Treatment Plan : ANUS Mitomycin D1,28 / 5FU D1-4, 28-31 q32d  ?   ? ? ? ?INTERVAL HISTORY:  ?Lorraine Gilbert is here for a follow up of anal cancer. She was last seen by NP Lacie on 04/22/21. She presents to the clinic accompanied by her daughter. ?She reports she is doing well overall. She reports some loose/soft stool about twice a day. She denies bleeding. ?  ?All other systems were reviewed with the patient and are negative. ? ?MEDICAL HISTORY:  ?Past Medical History:  ?Diagnosis Date  ? A-fib ([place])   ? Abnormal heart rhythm   ? Allergic rhinitis   ? Anxiety   ? Asthma   ? Diabetes mellitus without complication ([place])   ? Type II  ? GERD (gastroesophageal reflux disease)   ? Headache   ? History of radiation therapy 07/05/2018-07/18/2018  ? left pelvis        Dr Gery Pray  ? Hypertension   ? Neuromuscular disorder ([place])   ? Siactic pain in right leg  ? Osteopenia   ? Rheumatoid arthritis ([place])   ? Rotator cuff tear arthropathy   ? Right  ? Vertigo   ? ? ?SURGICAL HISTORY: ?  Past Surgical History:  ?Procedure Laterality Date  ? BIOPSY  12/13/2018  ? Procedure: BIOPSY;  Surgeon: Wonda Horner, MD;  Location: [facility];  Service: Endoscopy;;  ? CATARACT EXTRACTION    ? bilateral  ? COLONOSCOPY WITH PROPOFOL N/A 12/13/2018  ? Procedure: COLONOSCOPY WITH PROPOFOL;  Surgeon: Wonda Horner, MD;  Location: [facility];  Service: Endoscopy;  Laterality: N/A;  ? ESOPHAGOGASTRODUODENOSCOPY N/A 12/13/2018  ? Procedure: ESOPHAGOGASTRODUODENOSCOPY (EGD);  Surgeon: Wonda Horner, MD;  Location: [facility];  Service: Endoscopy;  Laterality: N/A;  ? EYE SURGERY    ? POLYPECTOMY  12/13/2018  ?  Procedure: POLYPECTOMY;  Surgeon: Wonda Horner, MD;  Location: [facility];  Service: Endoscopy;;  ? REVERSE SHOULDER ARTHROPLASTY Right 08/04/2017  ? REVERSE SHOULDER ARTHROPLASTY Right 08/04/2017  ? Procedure: RIG

## 2021-04-27 NOTE — Progress Notes (Signed)
During infusion appointment, patient had two episodes of diarrhea. Dr. Burr Medico notified and verbal order given for imodium. Patient then stated she felt better and will take imodium at home as needed. No further complaints reported.  ?

## 2021-04-27 NOTE — Procedures (Signed)
Right double-lumen basilic vein PICC placed.  Length 41 cm.  Tip SVC/right atrial junction.  Okay to use.  Medication used-1% lidocaine to skin and subcutaneous tissue.  EBL < 5 cc. ?

## 2021-04-28 ENCOUNTER — Ambulatory Visit
Admission: RE | Admit: 2021-04-28 | Discharge: 2021-04-28 | Disposition: A | Discharge status: Home / Self Care | Payer: Medicare Other | Source: Ambulatory Visit | Attending: Radiation Oncology | Admitting: Radiation Oncology

## 2021-04-28 DIAGNOSIS — Z7901 Long term (current) use of anticoagulants: Secondary | ICD-10-CM | POA: Diagnosis not present

## 2021-04-28 DIAGNOSIS — R197 Diarrhea, unspecified: Secondary | ICD-10-CM | POA: Diagnosis not present

## 2021-04-28 DIAGNOSIS — E1169 Type 2 diabetes mellitus with other specified complication: Secondary | ICD-10-CM | POA: Diagnosis not present

## 2021-04-28 DIAGNOSIS — Z923 Personal history of irradiation: Secondary | ICD-10-CM | POA: Diagnosis not present

## 2021-04-28 DIAGNOSIS — E119 Type 2 diabetes mellitus without complications: Secondary | ICD-10-CM | POA: Diagnosis not present

## 2021-04-28 DIAGNOSIS — Z79899 Other long term (current) drug therapy: Secondary | ICD-10-CM | POA: Diagnosis not present

## 2021-04-28 DIAGNOSIS — R300 Dysuria: Secondary | ICD-10-CM | POA: Diagnosis not present

## 2021-04-28 DIAGNOSIS — I4891 Unspecified atrial fibrillation: Secondary | ICD-10-CM | POA: Diagnosis not present

## 2021-04-28 DIAGNOSIS — E785 Hyperlipidemia, unspecified: Secondary | ICD-10-CM | POA: Diagnosis not present

## 2021-04-28 DIAGNOSIS — C774 Secondary and unspecified malignant neoplasm of inguinal and lower limb lymph nodes: Secondary | ICD-10-CM | POA: Diagnosis not present

## 2021-04-28 DIAGNOSIS — E7800 Pure hypercholesterolemia, unspecified: Secondary | ICD-10-CM | POA: Diagnosis not present

## 2021-04-28 DIAGNOSIS — C210 Malignant neoplasm of anus, unspecified: Secondary | ICD-10-CM | POA: Diagnosis not present

## 2021-04-28 DIAGNOSIS — I10 Essential (primary) hypertension: Secondary | ICD-10-CM | POA: Diagnosis not present

## 2021-04-28 DIAGNOSIS — R519 Headache, unspecified: Secondary | ICD-10-CM | POA: Diagnosis not present

## 2021-04-28 DIAGNOSIS — Z5111 Encounter for antineoplastic chemotherapy: Secondary | ICD-10-CM | POA: Diagnosis not present

## 2021-04-28 DIAGNOSIS — R3915 Urgency of urination: Secondary | ICD-10-CM | POA: Diagnosis not present

## 2021-04-28 DIAGNOSIS — F32A Depression, unspecified: Secondary | ICD-10-CM | POA: Diagnosis not present

## 2021-04-28 DIAGNOSIS — D61818 Other pancytopenia: Secondary | ICD-10-CM | POA: Diagnosis not present

## 2021-04-28 DIAGNOSIS — Z510 Encounter for antineoplastic radiation therapy: Secondary | ICD-10-CM | POA: Diagnosis not present

## 2021-04-28 DIAGNOSIS — R0781 Pleurodynia: Secondary | ICD-10-CM | POA: Diagnosis not present

## 2021-04-29 ENCOUNTER — Telehealth: Payer: Self-pay | Admitting: Hematology

## 2021-04-29 ENCOUNTER — Other Ambulatory Visit: Payer: Self-pay

## 2021-04-29 ENCOUNTER — Ambulatory Visit
Admission: RE | Admit: 2021-04-29 | Discharge: 2021-04-29 | Disposition: A | Discharge status: Home / Self Care | Payer: Medicare Other | Source: Ambulatory Visit | Attending: Radiation Oncology | Admitting: Radiation Oncology

## 2021-04-29 DIAGNOSIS — I4891 Unspecified atrial fibrillation: Secondary | ICD-10-CM | POA: Diagnosis not present

## 2021-04-29 DIAGNOSIS — R197 Diarrhea, unspecified: Secondary | ICD-10-CM | POA: Diagnosis not present

## 2021-04-29 DIAGNOSIS — C774 Secondary and unspecified malignant neoplasm of inguinal and lower limb lymph nodes: Secondary | ICD-10-CM | POA: Diagnosis not present

## 2021-04-29 DIAGNOSIS — Z7901 Long term (current) use of anticoagulants: Secondary | ICD-10-CM | POA: Diagnosis not present

## 2021-04-29 DIAGNOSIS — Z923 Personal history of irradiation: Secondary | ICD-10-CM | POA: Diagnosis not present

## 2021-04-29 DIAGNOSIS — Z79899 Other long term (current) drug therapy: Secondary | ICD-10-CM | POA: Diagnosis not present

## 2021-04-29 DIAGNOSIS — R519 Headache, unspecified: Secondary | ICD-10-CM | POA: Diagnosis not present

## 2021-04-29 DIAGNOSIS — R3915 Urgency of urination: Secondary | ICD-10-CM | POA: Diagnosis not present

## 2021-04-29 DIAGNOSIS — C210 Malignant neoplasm of anus, unspecified: Secondary | ICD-10-CM | POA: Diagnosis not present

## 2021-04-29 DIAGNOSIS — R300 Dysuria: Secondary | ICD-10-CM | POA: Diagnosis not present

## 2021-04-29 DIAGNOSIS — E785 Hyperlipidemia, unspecified: Secondary | ICD-10-CM | POA: Diagnosis not present

## 2021-04-29 DIAGNOSIS — Z5111 Encounter for antineoplastic chemotherapy: Secondary | ICD-10-CM | POA: Diagnosis not present

## 2021-04-29 DIAGNOSIS — R0781 Pleurodynia: Secondary | ICD-10-CM | POA: Diagnosis not present

## 2021-04-29 DIAGNOSIS — F32A Depression, unspecified: Secondary | ICD-10-CM | POA: Diagnosis not present

## 2021-04-29 DIAGNOSIS — E119 Type 2 diabetes mellitus without complications: Secondary | ICD-10-CM | POA: Diagnosis not present

## 2021-04-29 DIAGNOSIS — I10 Essential (primary) hypertension: Secondary | ICD-10-CM | POA: Diagnosis not present

## 2021-04-29 DIAGNOSIS — D61818 Other pancytopenia: Secondary | ICD-10-CM | POA: Diagnosis not present

## 2021-04-29 DIAGNOSIS — Z510 Encounter for antineoplastic radiation therapy: Secondary | ICD-10-CM | POA: Diagnosis not present

## 2021-04-29 NOTE — Telephone Encounter (Signed)
Scheduled follow-up appointments per 4/10 los. Patient is aware. ?

## 2021-04-30 ENCOUNTER — Ambulatory Visit
Admission: RE | Admit: 2021-04-30 | Discharge: 2021-04-30 | Disposition: A | Discharge status: Home / Self Care | Payer: Medicare Other | Source: Ambulatory Visit | Attending: Radiation Oncology | Admitting: Radiation Oncology

## 2021-04-30 ENCOUNTER — Other Ambulatory Visit: Payer: Self-pay

## 2021-04-30 DIAGNOSIS — Z79899 Other long term (current) drug therapy: Secondary | ICD-10-CM | POA: Diagnosis not present

## 2021-04-30 DIAGNOSIS — E785 Hyperlipidemia, unspecified: Secondary | ICD-10-CM | POA: Diagnosis not present

## 2021-04-30 DIAGNOSIS — E119 Type 2 diabetes mellitus without complications: Secondary | ICD-10-CM | POA: Diagnosis not present

## 2021-04-30 DIAGNOSIS — Z7901 Long term (current) use of anticoagulants: Secondary | ICD-10-CM | POA: Diagnosis not present

## 2021-04-30 DIAGNOSIS — Z510 Encounter for antineoplastic radiation therapy: Secondary | ICD-10-CM | POA: Diagnosis not present

## 2021-04-30 DIAGNOSIS — I10 Essential (primary) hypertension: Secondary | ICD-10-CM | POA: Diagnosis not present

## 2021-04-30 DIAGNOSIS — R519 Headache, unspecified: Secondary | ICD-10-CM | POA: Diagnosis not present

## 2021-04-30 DIAGNOSIS — C774 Secondary and unspecified malignant neoplasm of inguinal and lower limb lymph nodes: Secondary | ICD-10-CM | POA: Diagnosis not present

## 2021-04-30 DIAGNOSIS — Z5111 Encounter for antineoplastic chemotherapy: Secondary | ICD-10-CM | POA: Diagnosis not present

## 2021-04-30 DIAGNOSIS — C210 Malignant neoplasm of anus, unspecified: Secondary | ICD-10-CM | POA: Diagnosis not present

## 2021-04-30 DIAGNOSIS — D61818 Other pancytopenia: Secondary | ICD-10-CM | POA: Diagnosis not present

## 2021-04-30 DIAGNOSIS — R300 Dysuria: Secondary | ICD-10-CM | POA: Diagnosis not present

## 2021-04-30 DIAGNOSIS — R3915 Urgency of urination: Secondary | ICD-10-CM | POA: Diagnosis not present

## 2021-04-30 DIAGNOSIS — I4891 Unspecified atrial fibrillation: Secondary | ICD-10-CM | POA: Diagnosis not present

## 2021-04-30 DIAGNOSIS — R0781 Pleurodynia: Secondary | ICD-10-CM | POA: Diagnosis not present

## 2021-04-30 DIAGNOSIS — Z923 Personal history of irradiation: Secondary | ICD-10-CM | POA: Diagnosis not present

## 2021-04-30 DIAGNOSIS — R197 Diarrhea, unspecified: Secondary | ICD-10-CM | POA: Diagnosis not present

## 2021-04-30 DIAGNOSIS — F32A Depression, unspecified: Secondary | ICD-10-CM | POA: Diagnosis not present

## 2021-04-30 NOTE — Progress Notes (Signed)
Lorraine Gilbert ?OFFICE PROGRESS NOTE ? ?[name], MD ?[address] ?[place] [address] ? ?DIAGNOSIS: f/u of anal cancer ? ?Oncology History  ?Metastatic squamous cell carcinoma involving lymph node with unknown primary site [facility])  ?02/06/2018 Imaging  ? Impression CT abdomen and pelvis ?1.  Enlarged left groin lymph node which is of uncertain etiology. This would be amenable to percutaneous sampling if deemed clinically appropriate. ?2.  Hepatic steatosis. Focal area of low-attenuation in the far lateral left hepatic lobe, not clearly seen on the prior exam. Consider liver ultrasound for further evaluation.  ?3.  Mild L1 compression deformity which is new. ? ?  ?02/28/2018 Pathology Results  ? Left groin lymph node, needle core biopsy: ?      -Metastatic squamous cell carcinoma, see comment ? ?P16 stain is strongly positive, suggestive of cervical or anogenital origin in this location. ?Immunohistochemical stains for CK5, p63, and pan cytokeratin are positive. ER is weakly positive. CK7 and CK20 are negative. ? ?  ?03/15/2018 Pathology Results  ? 1. Cervix, biopsy, 12 o'clock ?- BENIGN SQUAMOUS MUCOSA WITH INFLAMMATION. ?- NO DYSPLASIA OR MALIGNANCY. ?2. Endocervix, curettage ?- BENIGN SQUAMOUS EPITHELIUM. ? ?  ?03/23/2018 PET scan  ? 1. Isolated intensely hypermetabolic enlarged LEFT inguinal lymph node. No clear primary identified. ?2. No abnormal activity associated with the vagina or anorectal tissue. ?3. No hypermetabolic lymph nodes in the pelvis. ?4. Two adjacent nodules in the RIGHT upper lobe with very low metabolic activity. These are not favored primary malignancies. Recommend follow-up per Fleischner criteria. Non-contrast chest CT at 6-12 months is recommended.  ? ?  ?07/05/2018 - 07/18/2018 Radiation Therapy  ? Left inguinal area/nodes treated to 30 Gy in 10 fractions ?  ?09/22/2020 Imaging  ? EXAM: CT ABDOMEN PELVIS W IV CONTRAST  ? ?IMPRESSION:  ?1. Multicystic enhancing  right adnexal lesion with mild surrounding stranding. Given short interval since recent CT 3 weeks prior, this is more suspicious for acute findings such as torsion or infection, less likely neoplasm. Recommend endovaginal ultrasound for further evaluation and follow-up to resolution.  ?2. Other stable chronic findings.  ?  ?01/30/2021 Pathology Results  ? DIAGNOSIS  ? ?SMALL  TUFT OF TISSUE ABOVE ANAL VERGE, ENDOSCOPIC BIOPSIES:  ? - HIGH-GRADE SQUAMOUS INTRAEPITHELIAL LESION/ANAL INTRAEPITHELIAL NEOPLASIA 2-3 OUT OF 3 (HGSIL/AIN 2-3).  ? - SEE COMMENT.  ?(MPL002)  ? ?COMMENT  ?MORPHOLOGICALLY THERE IS AT LEAST TWO THIRDS THICKNESS INVOLVEMENT BY DYSPLASTIC EPITHELIUM WITH MID-LEVEL MITOSES SECONDARY HOWEVER DUE TO POOR ORIENTATION AND MORPHOLOGIC ASSESSMENT IS 2 SERVICE MUCOSA IS ONLY PRESENT IN FOCAL AREAS MORPHOLOGICALLY THIS REPRESENTS AT LEAST AIN 2 AND MOST LIKELY AIN 3.  IN EITHER RESPECT THE FINDINGS ARE CONSISTENT WITH HIGH-GRADE SQUAMOUS INTRAEPITHELIAL LESION WHICH IS ALSO CONFIRMED BY STRONGLY BLOCK LIKE P16 IMMUNOHISTOCHEMICAL  POSITIVITY.  ?  ?03/05/2021 Pathology Results  ? Final Diagnosis    ?Rectal mass, biopsy: ?-Invasive squamous cell carcinoma, moderately differentiated  ? ?Addendum 1    ?The carcinoma was analyzed by [facility] for DNA mismatch repair proteins.  The neoplasm retained nuclear expression of all four mismatch repair proteins, MLH1, PMS2, MSH2, MSH6.  Controls worked appropriately.  ? ?  ?Anal cancer ([place])  ?01/30/2021 Cancer Staging  ? Staging form: Anus, AJCC V9 ?- Clinical stage from 01/30/2021: Stage I (cT1, cN0, cM0) - Signed by Truitt Merle, MD on 03/11/2021 ?Stage prefix: Initial diagnosis ?Histologic grade (G): G2 ?Histologic grading system: 4 grade system ? ?  ?03/11/2021 Initial  Diagnosis  ? Anal cancer ([place]) ? ?  ?03/30/2021 -  Chemotherapy  ? Patient is on Treatment Plan : ANUS Mitomycin D1,28 / 5FU D1-4, 28-31 q32d  ? ?  ?  ? ? ?CURRENT THERAPY: Concurrent chemo RT with 5-FU/mitomycin  on weeks 1 and 5, started 03/30/21.  ? ?INTERVAL HISTORY: ?Lorraine Gilbert 88 y.o. female returns to the clinic today for a follow-up visit.  Last week, the patient underwent her last week of chemotherapy.   Her last day radiation is scheduled for 05/07/21.  The patient is currently undergoing treatment with concurrent chemoradiation for her anal cancer.  Overall, the patient tolerated her chemotherapy treatment fairly well.  Dr. Burr Medico slightly increased the dose of her 5-FU with her most recent infusion since she had a dose reduction with cycle #1.  The patient does have a lot of external vaginal/rectal burning/itching from her radiation. She was given a cream,  sitz bath's, Dr. Sondra Come recently prescribed suppositories.  The patient's last day radiation is scheduled for Wednesday, 05/06/2021.  The patient does not want to take any pain medicine or any medications that make her drowsy.  She is willing to take Tylenol.  Her pain is exacerbated by having bowel movements although she denies any constipation or diarrhea at this time.  When she has a bowel movement, she reports some mucus with blood tinge in it.  She also has a lot of burning in the external area with urination.  She denies any cloudy or malodorous urine.  She is reports burning.  She denies any pelvic pain, fevers, chills, or night sweats.  She denies any bowel or bladder changes.  Her weight is stable but she has a diminished appetite.  She drinks ensure about 3x per day.  She is also prescribed Remeron.  She forces herself to eat because she is motivated.  Seen by me about 1 month ago, the patient was reporting focal tenderness over the right/lateral/lower rib.  The patient still endorses discomfort in this area.  She reports that her pain is exacerbated by laying on the side.  It is also exacerbated by bearing down to urinate or have a bowel movement.  She characterizes it as soreness.  She denies any nausea or vomiting.  The patient is here today for  repeat blood work and evaluation. ? ?MEDICAL HISTORY: ?Past Medical History:  ?Diagnosis Date  ? A-fib ([place])   ? Abnormal heart rhythm   ? Allergic rhinitis   ? Anxiety   ? Asthma   ? Diabetes mellitus without complication ([place])   ? Type II  ? GERD (gastroesophageal reflux disease)   ? Headache   ? History of radiation therapy 07/05/2018-07/18/2018  ? left pelvis        Dr Gery Pray  ? Hypertension   ? Neuromuscular disorder ([place])   ? Siactic pain in right leg  ? Osteopenia   ? Rheumatoid arthritis ([place])   ? Rotator cuff tear arthropathy   ? Right  ? Vertigo   ? ? ?ALLERGIES:  is allergic to ibuprofen. ? ?MEDICATIONS:  ?Current Outpatient Medications  ?Medication Sig Dispense Refill  ? ACCU-CHEK AVIVA PLUS test strip 1 each as directed.    ? Accu-Chek Softclix Lancets lancets 1 each by Other route as directed.    ? ACETAMINOPHEN 8 HOUR PO Take 1 tablet by mouth as needed.    ? albuterol (PROVENTIL) (2.5 MG/3ML) 0.083% nebulizer solution SMARTSIG:1 Vial(s) Via Nebulizer Every 4-6 Hours PRN    ?  Alcohol Swabs (ALCOHOL WIPES) 70 % PADS DropSafe Alcohol Prep Pads    ? amiodarone (PACERONE) 100 MG tablet Take 100 mg by mouth daily.    ? Blood Glucose Calibration (ACCU-CHEK AVIVA) SOLN     ? Blood Glucose Monitoring Suppl (TRUE METRIX AIR GLUCOSE METER) w/Device KIT See admin instructions.    ? clopidogrel (PLAVIX) 75 MG tablet Take 75 mg by mouth daily.    ? diclofenac Sodium (VOLTAREN) 1 % GEL Apply topically.    ? ELIQUIS 2.5 MG TABS tablet Take 2.5 mg by mouth 2 (two) times daily.    ? escitalopram (LEXAPRO) 10 MG tablet Take 10 mg by mouth daily.    ? folic acid (FOLVITE) 1 MG tablet Take by mouth.    ? hydrocortisone (ANUSOL-HC) 25 MG suppository Place 1 suppository (25 mg total) rectally 2 (two) times daily. 12 suppository 0  ? levalbuterol (XOPENEX) 1.25 MG/3ML nebulizer solution Inhale into the lungs.    ? mirtazapine (REMERON) 7.5 MG tablet Take 1 tablet (7.5 mg total) by mouth at bedtime. 30 tablet 0  ?  polyethylene glycol powder (GLYCOLAX/MIRALAX) 17 GM/SCOOP powder Take by mouth.    ? rosuvastatin (CRESTOR) 5 MG tablet Take by mouth.    ? TRELEGY ELLIPTA 200-62.5-25 MCG/ACT AEPB Take 1 puff by mouth daily.    ?

## 2021-05-01 ENCOUNTER — Ambulatory Visit
Admission: RE | Admit: 2021-05-01 | Discharge: 2021-05-01 | Disposition: A | Discharge status: Home / Self Care | Payer: Medicare Other | Source: Ambulatory Visit | Attending: Radiation Oncology | Admitting: Radiation Oncology

## 2021-05-01 ENCOUNTER — Inpatient Hospital Stay: Payer: Medicare Other

## 2021-05-01 ENCOUNTER — Other Ambulatory Visit: Payer: Self-pay

## 2021-05-01 VITALS — BP 149/49 | HR 62 | Temp 98.1°F | Resp 17

## 2021-05-01 DIAGNOSIS — Z79899 Other long term (current) drug therapy: Secondary | ICD-10-CM | POA: Diagnosis not present

## 2021-05-01 DIAGNOSIS — I4891 Unspecified atrial fibrillation: Secondary | ICD-10-CM | POA: Diagnosis not present

## 2021-05-01 DIAGNOSIS — I10 Essential (primary) hypertension: Secondary | ICD-10-CM | POA: Diagnosis not present

## 2021-05-01 DIAGNOSIS — E785 Hyperlipidemia, unspecified: Secondary | ICD-10-CM | POA: Diagnosis not present

## 2021-05-01 DIAGNOSIS — F32A Depression, unspecified: Secondary | ICD-10-CM | POA: Diagnosis not present

## 2021-05-01 DIAGNOSIS — R0781 Pleurodynia: Secondary | ICD-10-CM | POA: Diagnosis not present

## 2021-05-01 DIAGNOSIS — Z7901 Long term (current) use of anticoagulants: Secondary | ICD-10-CM | POA: Diagnosis not present

## 2021-05-01 DIAGNOSIS — R300 Dysuria: Secondary | ICD-10-CM | POA: Diagnosis not present

## 2021-05-01 DIAGNOSIS — E119 Type 2 diabetes mellitus without complications: Secondary | ICD-10-CM | POA: Diagnosis not present

## 2021-05-01 DIAGNOSIS — R197 Diarrhea, unspecified: Secondary | ICD-10-CM | POA: Diagnosis not present

## 2021-05-01 DIAGNOSIS — R3915 Urgency of urination: Secondary | ICD-10-CM | POA: Diagnosis not present

## 2021-05-01 DIAGNOSIS — Z510 Encounter for antineoplastic radiation therapy: Secondary | ICD-10-CM | POA: Diagnosis not present

## 2021-05-01 DIAGNOSIS — Z923 Personal history of irradiation: Secondary | ICD-10-CM | POA: Diagnosis not present

## 2021-05-01 DIAGNOSIS — D61818 Other pancytopenia: Secondary | ICD-10-CM | POA: Diagnosis not present

## 2021-05-01 DIAGNOSIS — C774 Secondary and unspecified malignant neoplasm of inguinal and lower limb lymph nodes: Secondary | ICD-10-CM | POA: Diagnosis not present

## 2021-05-01 DIAGNOSIS — C210 Malignant neoplasm of anus, unspecified: Secondary | ICD-10-CM

## 2021-05-01 DIAGNOSIS — Z5111 Encounter for antineoplastic chemotherapy: Secondary | ICD-10-CM | POA: Diagnosis not present

## 2021-05-01 DIAGNOSIS — R519 Headache, unspecified: Secondary | ICD-10-CM | POA: Diagnosis not present

## 2021-05-01 MED ORDER — SODIUM CHLORIDE 0.9% FLUSH
10.0000 mL | INTRAVENOUS | Status: DC | PRN
  Administered 2021-05-01: 10 mL

## 2021-05-01 NOTE — Patient Instructions (Signed)
PICC Removal, Adult, Care After The following information offers guidance on how to care for yourself after your procedure. Your health care provider may also give you more specific instructions. If you have problems or questions, contact your health care provider. What can I expect after the procedure? After the procedure, it is common to have: Tenderness or soreness. Redness, swelling, or a scab at the place where your PICC was removed (exit site). Follow these instructions at home: For the first 24 hours after the procedure: Keep the bandage (dressing) on your exit site clean and dry. Do not remove your dressing until your health care provider tells you to do so. Do not lift anything heavy or do activities that require great effort until your health care provider says it is okay. You should avoid: Lifting weights. Doing yard work. Doing any physical activity with repetitive arm movement. Watch closely for any signs of an air bubble in the vein (air embolism). This is a rare but serious complication. Signs of an air embolism include trouble breathing, wheezing, chest pain, or a fast pulse. If you have signs of an air embolism, call 911 right away and lie down on your left side to keep the air from moving into your lungs. After 24 hours have passed:  Remove your dressing as told by your health care provider. Wash your hands with soap and water for at least 20 seconds before and after you change your dressing. If soap and water are not available, use hand sanitizer. Return to your normal activities as told by your health care provider. A small scab may develop over the exit site. Do not pick at the scab. When bathing or showering, gently wash the exit site with soap and water. Pat it dry. Watch for signs of infection, such as: A fever or chills. Swollen glands under your arm. More redness, swelling, or soreness around your arm. Blood, fluid, or pus coming from your exit site. Warmth or a  bad smell coming from your exit site. A red streak spreading away from your exit site. General instructions Take over-the-counter and prescription medicines only as told by your health care provider. Do not take any new medicines without checking with your health care provider first. If you were given an antibiotic ointment, apply it as told by your health care provider. Keep all follow-up visits. This is important. Contact a health care provider if: You have a fever or chills. You have swelling at your exit site or swollen glands under your arm. You have signs of infection at your exit site. You have soreness, redness, or swelling in your arm that gets worse. Get help right away if: You have numbness or tingling in your fingers, hand, or arm. Your arm looks blue and feels cold. You have signs of an air embolism, such as trouble breathing, wheezing, chest pain, or a fast pulse. These symptoms may be an emergency. Get medical help right away. Call 911. Do not wait to see if the symptoms will go away. Do not drive yourself to the hospital. Summary After a PICC is removed, it is common to have tenderness or soreness, redness, swelling, or a scab at the exit site. Keep the bandage (dressing) over the exit site clean and dry. Do not remove the dressing until your health care provider tells you to do so. Do not lift anything heavy or do activities that require great effort until your health care provider says it is okay. Watch closely for any signs   of an air bubble (air embolism). If you have signs of an air embolism, call 911 right away and lie down on your left side. This information is not intended to replace advice given to you by your health care provider. Make sure you discuss any questions you have with your health care provider. Document Revised: 07/23/2020 Document Reviewed: 07/23/2020 Elsevier Patient Education  2023 Elsevier Inc.  

## 2021-05-01 NOTE — Progress Notes (Signed)
Patient tolerated PICC line DC. Line measured at 41cm. Occlusive pressure dressing applied. Patient remained for 30 minutes post PICC line DC and provided the verbal and written instructions. ?

## 2021-05-04 ENCOUNTER — Ambulatory Visit (HOSPITAL_COMMUNITY)
Admission: RE | Admit: 2021-05-04 | Discharge: 2021-05-04 | Disposition: A | Discharge status: Home / Self Care | Payer: Medicare Other | Source: Ambulatory Visit | Attending: Physician Assistant | Admitting: Physician Assistant

## 2021-05-04 ENCOUNTER — Inpatient Hospital Stay: Payer: Medicare Other

## 2021-05-04 ENCOUNTER — Encounter: Payer: Self-pay | Admitting: Physician Assistant

## 2021-05-04 ENCOUNTER — Other Ambulatory Visit: Payer: Self-pay

## 2021-05-04 ENCOUNTER — Other Ambulatory Visit: Payer: Self-pay | Admitting: Physician Assistant

## 2021-05-04 ENCOUNTER — Ambulatory Visit
Admission: RE | Admit: 2021-05-04 | Discharge: 2021-05-04 | Disposition: A | Discharge status: Home / Self Care | Payer: Medicare Other | Source: Ambulatory Visit | Attending: Radiation Oncology | Admitting: Radiation Oncology

## 2021-05-04 ENCOUNTER — Inpatient Hospital Stay (HOSPITAL_BASED_OUTPATIENT_CLINIC_OR_DEPARTMENT_OTHER): Payer: Medicare Other | Admitting: Physician Assistant

## 2021-05-04 VITALS — BP 130/57 | HR 71 | Temp 98.1°F | Resp 16 | Wt 133.7 lb

## 2021-05-04 DIAGNOSIS — Z510 Encounter for antineoplastic radiation therapy: Secondary | ICD-10-CM | POA: Diagnosis not present

## 2021-05-04 DIAGNOSIS — C210 Malignant neoplasm of anus, unspecified: Secondary | ICD-10-CM

## 2021-05-04 DIAGNOSIS — I4891 Unspecified atrial fibrillation: Secondary | ICD-10-CM | POA: Insufficient documentation

## 2021-05-04 DIAGNOSIS — C801 Malignant (primary) neoplasm, unspecified: Secondary | ICD-10-CM

## 2021-05-04 DIAGNOSIS — R0781 Pleurodynia: Secondary | ICD-10-CM | POA: Diagnosis not present

## 2021-05-04 DIAGNOSIS — R300 Dysuria: Secondary | ICD-10-CM | POA: Insufficient documentation

## 2021-05-04 DIAGNOSIS — E119 Type 2 diabetes mellitus without complications: Secondary | ICD-10-CM | POA: Insufficient documentation

## 2021-05-04 DIAGNOSIS — C779 Secondary and unspecified malignant neoplasm of lymph node, unspecified: Secondary | ICD-10-CM

## 2021-05-04 DIAGNOSIS — K219 Gastro-esophageal reflux disease without esophagitis: Secondary | ICD-10-CM | POA: Insufficient documentation

## 2021-05-04 DIAGNOSIS — J45909 Unspecified asthma, uncomplicated: Secondary | ICD-10-CM | POA: Diagnosis not present

## 2021-05-04 DIAGNOSIS — J8489 Other specified interstitial pulmonary diseases: Secondary | ICD-10-CM | POA: Diagnosis not present

## 2021-05-04 LAB — CMP (CANCER CENTER ONLY)
ALT: 15 U/L (ref 0–44)
AST: 21 U/L (ref 15–41)
Albumin: 3.8 g/dL (ref 3.5–5.0)
Alkaline Phosphatase: 51 U/L (ref 38–126)
Anion gap: 5 (ref 5–15)
BUN: 10 mg/dL (ref 8–23)
CO2: 28 mmol/L (ref 22–32)
Calcium: 8.9 mg/dL (ref 8.9–10.3)
Chloride: 108 mmol/L (ref 98–111)
Creatinine: 0.81 mg/dL (ref 0.44–1.00)
GFR, Estimated: >60 mL/min (ref >60)
Glucose, Bld: 117 mg/dL — ABNORMAL HIGH (ref 70–99)
Potassium: 4.2 mmol/L (ref 3.5–5.1)
Sodium: 141 mmol/L (ref 135–145)
Total Bilirubin: 0.4 mg/dL (ref 0.3–1.2)
Total Protein: 6.9 g/dL (ref 6.5–8.1)

## 2021-05-04 LAB — CBC WITH DIFFERENTIAL (CANCER CENTER ONLY)
Abs Immature Granulocytes: 0.03 10*3/uL (ref 0.00–0.07)
Basophils Absolute: 0 10*3/uL (ref 0.0–0.1)
Basophils Relative: 0 %
Eosinophils Absolute: 0.4 10*3/uL (ref 0.0–0.5)
Eosinophils Relative: 13 %
HCT: 33.1 % — ABNORMAL LOW (ref 36.0–46.0)
Hemoglobin: 11.1 g/dL — ABNORMAL LOW (ref 12.0–15.0)
Immature Granulocytes: 1 %
Lymphocytes Relative: 14 %
Lymphs Abs: 0.4 10*3/uL — ABNORMAL LOW (ref 0.7–4.0)
MCH: 30.6 pg (ref 26.0–34.0)
MCHC: 33.5 g/dL (ref 30.0–36.0)
MCV: 91.2 fL (ref 80.0–100.0)
Monocytes Absolute: 0.1 10*3/uL (ref 0.1–1.0)
Monocytes Relative: 3 %
Neutro Abs: 1.8 10*3/uL (ref 1.7–7.7)
Neutrophils Relative %: 69 %
Platelet Count: 154 10*3/uL (ref 150–400)
RBC: 3.63 MIL/uL — ABNORMAL LOW (ref 3.87–5.11)
RDW: 15.3 % (ref 11.5–15.5)
WBC Count: 2.7 10*3/uL — ABNORMAL LOW (ref 4.0–10.5)
nRBC: 0 % (ref 0.0–0.2)

## 2021-05-04 LAB — URINALYSIS, COMPLETE (UACMP) WITH MICROSCOPIC
Bilirubin Urine: NEGATIVE
Glucose, UA: NEGATIVE mg/dL
Ketones, ur: NEGATIVE mg/dL
Nitrite: NEGATIVE
Protein, ur: 30 mg/dL — AB
Specific Gravity, Urine: 1.018 (ref 1.005–1.030)
WBC, UA: >50 WBC/hpf — ABNORMAL HIGH (ref 0–5)
pH: 5 (ref 5.0–8.0)

## 2021-05-04 MED ORDER — SULFAMETHOXAZOLE-TRIMETHOPRIM 800-160 MG PO TABS: 1.0000 | ORAL_TABLET | Freq: Two times a day (BID) | ORAL | 0 refills | Status: DC

## 2021-05-05 ENCOUNTER — Other Ambulatory Visit: Payer: Self-pay | Admitting: Physician Assistant

## 2021-05-05 ENCOUNTER — Ambulatory Visit
Admission: RE | Admit: 2021-05-05 | Discharge: 2021-05-05 | Disposition: A | Discharge status: Home / Self Care | Payer: Medicare Other | Source: Ambulatory Visit | Attending: Radiation Oncology | Admitting: Radiation Oncology

## 2021-05-05 ENCOUNTER — Other Ambulatory Visit: Payer: Self-pay

## 2021-05-05 DIAGNOSIS — Z79899 Other long term (current) drug therapy: Secondary | ICD-10-CM | POA: Diagnosis not present

## 2021-05-05 DIAGNOSIS — C210 Malignant neoplasm of anus, unspecified: Secondary | ICD-10-CM | POA: Diagnosis not present

## 2021-05-05 DIAGNOSIS — R3915 Urgency of urination: Secondary | ICD-10-CM | POA: Diagnosis not present

## 2021-05-05 DIAGNOSIS — I4891 Unspecified atrial fibrillation: Secondary | ICD-10-CM | POA: Diagnosis not present

## 2021-05-05 DIAGNOSIS — R519 Headache, unspecified: Secondary | ICD-10-CM | POA: Diagnosis not present

## 2021-05-05 DIAGNOSIS — E119 Type 2 diabetes mellitus without complications: Secondary | ICD-10-CM | POA: Diagnosis not present

## 2021-05-05 DIAGNOSIS — R300 Dysuria: Secondary | ICD-10-CM

## 2021-05-05 DIAGNOSIS — E785 Hyperlipidemia, unspecified: Secondary | ICD-10-CM | POA: Diagnosis not present

## 2021-05-05 DIAGNOSIS — Z5111 Encounter for antineoplastic chemotherapy: Secondary | ICD-10-CM | POA: Diagnosis not present

## 2021-05-05 DIAGNOSIS — Z7901 Long term (current) use of anticoagulants: Secondary | ICD-10-CM | POA: Diagnosis not present

## 2021-05-05 DIAGNOSIS — I10 Essential (primary) hypertension: Secondary | ICD-10-CM | POA: Diagnosis not present

## 2021-05-05 DIAGNOSIS — C774 Secondary and unspecified malignant neoplasm of inguinal and lower limb lymph nodes: Secondary | ICD-10-CM | POA: Diagnosis not present

## 2021-05-05 DIAGNOSIS — R197 Diarrhea, unspecified: Secondary | ICD-10-CM | POA: Diagnosis not present

## 2021-05-05 DIAGNOSIS — D61818 Other pancytopenia: Secondary | ICD-10-CM | POA: Diagnosis not present

## 2021-05-05 DIAGNOSIS — Z510 Encounter for antineoplastic radiation therapy: Secondary | ICD-10-CM | POA: Diagnosis not present

## 2021-05-05 DIAGNOSIS — F32A Depression, unspecified: Secondary | ICD-10-CM | POA: Diagnosis not present

## 2021-05-05 DIAGNOSIS — R0781 Pleurodynia: Secondary | ICD-10-CM | POA: Diagnosis not present

## 2021-05-05 DIAGNOSIS — Z923 Personal history of irradiation: Secondary | ICD-10-CM | POA: Diagnosis not present

## 2021-05-05 LAB — URINE CULTURE

## 2021-05-05 LAB — RAD ONC ARIA SESSION SUMMARY
Course Elapsed Days: 36
Plan Fractions Treated to Date: 6
Plan Prescribed Dose Per Fraction: 1.8 Gy
Plan Total Fractions Prescribed: 8
Plan Total Prescribed Dose: 14.4 Gy
Reference Point Dosage Given to Date: 46.8 Gy
Reference Point Session Dosage Given: 1.8 Gy
Session Number: 26

## 2021-05-05 NOTE — Progress Notes (Signed)
Urine specimen for culture contaminated from yesterday. The patient is on her way home from the clinic at this time. She will be back tomorrow for radiation. Discussed with her daughter in law that I will make a lab appointment after radiation to recollect the urine culture. They expressed understanding and know to check back in after radiation tomorrow for a lab visit.  ?

## 2021-05-06 ENCOUNTER — Ambulatory Visit
Admission: RE | Admit: 2021-05-06 | Discharge: 2021-05-06 | Disposition: A | Discharge status: Home / Self Care | Payer: Medicare Other | Source: Ambulatory Visit | Attending: Radiation Oncology | Admitting: Radiation Oncology

## 2021-05-06 ENCOUNTER — Other Ambulatory Visit: Payer: Self-pay

## 2021-05-06 ENCOUNTER — Inpatient Hospital Stay: Payer: Medicare Other

## 2021-05-06 DIAGNOSIS — Z923 Personal history of irradiation: Secondary | ICD-10-CM | POA: Diagnosis not present

## 2021-05-06 DIAGNOSIS — I4891 Unspecified atrial fibrillation: Secondary | ICD-10-CM | POA: Diagnosis not present

## 2021-05-06 DIAGNOSIS — R197 Diarrhea, unspecified: Secondary | ICD-10-CM | POA: Diagnosis not present

## 2021-05-06 DIAGNOSIS — Z79899 Other long term (current) drug therapy: Secondary | ICD-10-CM | POA: Diagnosis not present

## 2021-05-06 DIAGNOSIS — R3915 Urgency of urination: Secondary | ICD-10-CM | POA: Diagnosis not present

## 2021-05-06 DIAGNOSIS — R300 Dysuria: Secondary | ICD-10-CM | POA: Diagnosis not present

## 2021-05-06 DIAGNOSIS — C774 Secondary and unspecified malignant neoplasm of inguinal and lower limb lymph nodes: Secondary | ICD-10-CM | POA: Diagnosis not present

## 2021-05-06 DIAGNOSIS — Z5111 Encounter for antineoplastic chemotherapy: Secondary | ICD-10-CM | POA: Diagnosis not present

## 2021-05-06 DIAGNOSIS — I10 Essential (primary) hypertension: Secondary | ICD-10-CM | POA: Diagnosis not present

## 2021-05-06 DIAGNOSIS — E785 Hyperlipidemia, unspecified: Secondary | ICD-10-CM | POA: Diagnosis not present

## 2021-05-06 DIAGNOSIS — E119 Type 2 diabetes mellitus without complications: Secondary | ICD-10-CM | POA: Diagnosis not present

## 2021-05-06 DIAGNOSIS — Z7901 Long term (current) use of anticoagulants: Secondary | ICD-10-CM | POA: Diagnosis not present

## 2021-05-06 DIAGNOSIS — C210 Malignant neoplasm of anus, unspecified: Secondary | ICD-10-CM | POA: Diagnosis not present

## 2021-05-06 DIAGNOSIS — R0781 Pleurodynia: Secondary | ICD-10-CM | POA: Diagnosis not present

## 2021-05-06 DIAGNOSIS — D61818 Other pancytopenia: Secondary | ICD-10-CM | POA: Diagnosis not present

## 2021-05-06 DIAGNOSIS — Z510 Encounter for antineoplastic radiation therapy: Secondary | ICD-10-CM | POA: Diagnosis not present

## 2021-05-06 DIAGNOSIS — R519 Headache, unspecified: Secondary | ICD-10-CM | POA: Diagnosis not present

## 2021-05-06 DIAGNOSIS — F32A Depression, unspecified: Secondary | ICD-10-CM | POA: Diagnosis not present

## 2021-05-06 LAB — RAD ONC ARIA SESSION SUMMARY
Course Elapsed Days: 37
Plan Fractions Treated to Date: 7
Plan Prescribed Dose Per Fraction: 1.8 Gy
Plan Total Fractions Prescribed: 8
Plan Total Prescribed Dose: 14.4 Gy
Reference Point Dosage Given to Date: 48.6 Gy
Reference Point Session Dosage Given: 1.8 Gy
Session Number: 27

## 2021-05-07 ENCOUNTER — Other Ambulatory Visit: Payer: Medicare Other

## 2021-05-07 ENCOUNTER — Ambulatory Visit
Admission: RE | Admit: 2021-05-07 | Discharge: 2021-05-07 | Disposition: A | Discharge status: Home / Self Care | Payer: Medicare Other | Source: Ambulatory Visit | Attending: Radiation Oncology | Admitting: Radiation Oncology

## 2021-05-07 ENCOUNTER — Ambulatory Visit: Payer: Medicare Other | Admitting: Nurse Practitioner

## 2021-05-07 ENCOUNTER — Other Ambulatory Visit: Payer: Self-pay

## 2021-05-07 ENCOUNTER — Encounter: Payer: Self-pay | Admitting: Radiation Oncology

## 2021-05-07 DIAGNOSIS — R519 Headache, unspecified: Secondary | ICD-10-CM | POA: Diagnosis not present

## 2021-05-07 DIAGNOSIS — I10 Essential (primary) hypertension: Secondary | ICD-10-CM | POA: Diagnosis not present

## 2021-05-07 DIAGNOSIS — D61818 Other pancytopenia: Secondary | ICD-10-CM | POA: Diagnosis not present

## 2021-05-07 DIAGNOSIS — Z5111 Encounter for antineoplastic chemotherapy: Secondary | ICD-10-CM | POA: Diagnosis not present

## 2021-05-07 DIAGNOSIS — C210 Malignant neoplasm of anus, unspecified: Secondary | ICD-10-CM | POA: Diagnosis not present

## 2021-05-07 DIAGNOSIS — R300 Dysuria: Secondary | ICD-10-CM | POA: Diagnosis not present

## 2021-05-07 DIAGNOSIS — Z923 Personal history of irradiation: Secondary | ICD-10-CM | POA: Diagnosis not present

## 2021-05-07 DIAGNOSIS — F32A Depression, unspecified: Secondary | ICD-10-CM | POA: Diagnosis not present

## 2021-05-07 DIAGNOSIS — Z79899 Other long term (current) drug therapy: Secondary | ICD-10-CM | POA: Diagnosis not present

## 2021-05-07 DIAGNOSIS — Z510 Encounter for antineoplastic radiation therapy: Secondary | ICD-10-CM | POA: Diagnosis not present

## 2021-05-07 DIAGNOSIS — C774 Secondary and unspecified malignant neoplasm of inguinal and lower limb lymph nodes: Secondary | ICD-10-CM | POA: Diagnosis not present

## 2021-05-07 DIAGNOSIS — Z7901 Long term (current) use of anticoagulants: Secondary | ICD-10-CM | POA: Diagnosis not present

## 2021-05-07 DIAGNOSIS — R197 Diarrhea, unspecified: Secondary | ICD-10-CM | POA: Diagnosis not present

## 2021-05-07 DIAGNOSIS — R3915 Urgency of urination: Secondary | ICD-10-CM | POA: Diagnosis not present

## 2021-05-07 DIAGNOSIS — E119 Type 2 diabetes mellitus without complications: Secondary | ICD-10-CM | POA: Diagnosis not present

## 2021-05-07 DIAGNOSIS — I4891 Unspecified atrial fibrillation: Secondary | ICD-10-CM | POA: Diagnosis not present

## 2021-05-07 DIAGNOSIS — R0781 Pleurodynia: Secondary | ICD-10-CM | POA: Diagnosis not present

## 2021-05-07 DIAGNOSIS — E785 Hyperlipidemia, unspecified: Secondary | ICD-10-CM | POA: Diagnosis not present

## 2021-05-07 LAB — RAD ONC ARIA SESSION SUMMARY
Course Elapsed Days: 38
Plan Fractions Treated to Date: 8
Plan Prescribed Dose Per Fraction: 1.8 Gy
Plan Total Fractions Prescribed: 8
Plan Total Prescribed Dose: 14.4 Gy
Reference Point Dosage Given to Date: 50.4 Gy
Reference Point Session Dosage Given: 1.8 Gy
Session Number: 28

## 2021-05-10 LAB — URINE CULTURE

## 2021-05-12 ENCOUNTER — Telehealth: Payer: Self-pay

## 2021-05-12 ENCOUNTER — Other Ambulatory Visit: Payer: Self-pay

## 2021-05-12 DIAGNOSIS — Z79899 Other long term (current) drug therapy: Secondary | ICD-10-CM | POA: Diagnosis not present

## 2021-05-12 DIAGNOSIS — Z888 Allergy status to other drugs, medicaments and biological substances status: Secondary | ICD-10-CM | POA: Diagnosis not present

## 2021-05-12 DIAGNOSIS — K56600 Partial intestinal obstruction, unspecified as to cause: Secondary | ICD-10-CM | POA: Diagnosis not present

## 2021-05-12 DIAGNOSIS — Z885 Allergy status to narcotic agent status: Secondary | ICD-10-CM | POA: Diagnosis not present

## 2021-05-12 DIAGNOSIS — K56609 Unspecified intestinal obstruction, unspecified as to partial versus complete obstruction: Secondary | ICD-10-CM | POA: Diagnosis not present

## 2021-05-12 DIAGNOSIS — I110 Hypertensive heart disease with heart failure: Secondary | ICD-10-CM | POA: Diagnosis not present

## 2021-05-12 DIAGNOSIS — C20 Malignant neoplasm of rectum: Secondary | ICD-10-CM | POA: Diagnosis not present

## 2021-05-12 DIAGNOSIS — Z9221 Personal history of antineoplastic chemotherapy: Secondary | ICD-10-CM | POA: Diagnosis not present

## 2021-05-12 DIAGNOSIS — I5032 Chronic diastolic (congestive) heart failure: Secondary | ICD-10-CM | POA: Diagnosis not present

## 2021-05-12 DIAGNOSIS — K5641 Fecal impaction: Secondary | ICD-10-CM | POA: Diagnosis not present

## 2021-05-12 DIAGNOSIS — C189 Malignant neoplasm of colon, unspecified: Secondary | ICD-10-CM | POA: Diagnosis not present

## 2021-05-12 DIAGNOSIS — C210 Malignant neoplasm of anus, unspecified: Secondary | ICD-10-CM

## 2021-05-12 DIAGNOSIS — C801 Malignant (primary) neoplasm, unspecified: Secondary | ICD-10-CM

## 2021-05-12 DIAGNOSIS — E278 Other specified disorders of adrenal gland: Secondary | ICD-10-CM | POA: Diagnosis not present

## 2021-05-12 DIAGNOSIS — T451X5A Adverse effect of antineoplastic and immunosuppressive drugs, initial encounter: Secondary | ICD-10-CM | POA: Diagnosis not present

## 2021-05-12 DIAGNOSIS — K5989 Other specified functional intestinal disorders: Secondary | ICD-10-CM | POA: Diagnosis not present

## 2021-05-12 DIAGNOSIS — E860 Dehydration: Secondary | ICD-10-CM | POA: Insufficient documentation

## 2021-05-12 DIAGNOSIS — T508X5A Adverse effect of diagnostic agents, initial encounter: Secondary | ICD-10-CM | POA: Diagnosis not present

## 2021-05-12 DIAGNOSIS — K6389 Other specified diseases of intestine: Secondary | ICD-10-CM | POA: Diagnosis not present

## 2021-05-12 DIAGNOSIS — R531 Weakness: Secondary | ICD-10-CM | POA: Diagnosis not present

## 2021-05-12 DIAGNOSIS — K521 Toxic gastroenteritis and colitis: Secondary | ICD-10-CM | POA: Diagnosis not present

## 2021-05-12 DIAGNOSIS — N281 Cyst of kidney, acquired: Secondary | ICD-10-CM | POA: Diagnosis not present

## 2021-05-12 DIAGNOSIS — I2510 Atherosclerotic heart disease of native coronary artery without angina pectoris: Secondary | ICD-10-CM | POA: Diagnosis not present

## 2021-05-12 DIAGNOSIS — M353 Polymyalgia rheumatica: Secondary | ICD-10-CM | POA: Diagnosis not present

## 2021-05-12 DIAGNOSIS — Z955 Presence of coronary angioplasty implant and graft: Secondary | ICD-10-CM | POA: Diagnosis not present

## 2021-05-12 DIAGNOSIS — D701 Agranulocytosis secondary to cancer chemotherapy: Secondary | ICD-10-CM | POA: Diagnosis not present

## 2021-05-12 NOTE — Telephone Encounter (Signed)
[name] Iva Boop called stating that the pt is very lethargic and fatigue.  Pt's not eating or drinking much and pt's husband is encouraging pt to drink as much as possible.  Pt is diabetic; therefore, asked [name] what is the pt's BG.  [name] asked pt's husband to check pt's BG which is 131.  Pt has diarrhea but is not taking anything for diarrhea.  Pt received Mitomycin +96hr 5FU the week of 05/01/2021.  Pt's PICC was pulled on the last day of chemotherapy on 05/01/2021.  Pt's Orange at time of chemotherapy was 1.8 which was within tx parameters.  Pt was seen by Cassie, PA in clinic at which time Cassie suspected the pt had a UTI d/t pt's c/o vaginal pain.  Pt ordered a UA/UC but the UC twice showed multiple growths indicating a clean catch urine specimen was not obtained.  Checked with [facility] to see if pt could be seen but after further evaluation.  [facility] recommended that the pt go to the ER for further evaluation.  [name] stated that the pt will go to the closes ER to her house which [place].  Instructed [name] to make sure pt takes her Chemotherapy Wallet card that was given to her during chemo education class with her to the ER.  [name] verbalized understanding of instructions and had no further questions at this time.  Notified Dr. Burr Medico of the pt's condition.    ?

## 2021-05-19 ENCOUNTER — Inpatient Hospital Stay: Payer: Medicare Other | Attending: Hematology | Admitting: Hematology

## 2021-05-19 ENCOUNTER — Inpatient Hospital Stay: Payer: Medicare Other

## 2021-05-19 ENCOUNTER — Encounter: Payer: Self-pay | Admitting: Hematology

## 2021-05-19 ENCOUNTER — Other Ambulatory Visit: Payer: Self-pay

## 2021-05-19 VITALS — BP 166/63 | HR 78 | Temp 97.8°F | Resp 18 | Ht 62.0 in | Wt 134.4 lb

## 2021-05-19 DIAGNOSIS — C774 Secondary and unspecified malignant neoplasm of inguinal and lower limb lymph nodes: Secondary | ICD-10-CM | POA: Diagnosis not present

## 2021-05-19 DIAGNOSIS — F32A Depression, unspecified: Secondary | ICD-10-CM | POA: Diagnosis not present

## 2021-05-19 DIAGNOSIS — Z79899 Other long term (current) drug therapy: Secondary | ICD-10-CM | POA: Diagnosis not present

## 2021-05-19 DIAGNOSIS — C210 Malignant neoplasm of anus, unspecified: Secondary | ICD-10-CM

## 2021-05-19 DIAGNOSIS — E785 Hyperlipidemia, unspecified: Secondary | ICD-10-CM | POA: Diagnosis not present

## 2021-05-19 DIAGNOSIS — C779 Secondary and unspecified malignant neoplasm of lymph node, unspecified: Secondary | ICD-10-CM

## 2021-05-19 DIAGNOSIS — R0781 Pleurodynia: Secondary | ICD-10-CM | POA: Insufficient documentation

## 2021-05-19 DIAGNOSIS — I10 Essential (primary) hypertension: Secondary | ICD-10-CM | POA: Insufficient documentation

## 2021-05-19 DIAGNOSIS — Z7901 Long term (current) use of anticoagulants: Secondary | ICD-10-CM | POA: Diagnosis not present

## 2021-05-19 DIAGNOSIS — E119 Type 2 diabetes mellitus without complications: Secondary | ICD-10-CM | POA: Diagnosis not present

## 2021-05-19 DIAGNOSIS — I4891 Unspecified atrial fibrillation: Secondary | ICD-10-CM | POA: Diagnosis not present

## 2021-05-19 DIAGNOSIS — E860 Dehydration: Secondary | ICD-10-CM

## 2021-05-19 DIAGNOSIS — Z923 Personal history of irradiation: Secondary | ICD-10-CM | POA: Diagnosis not present

## 2021-05-19 DIAGNOSIS — Z9221 Personal history of antineoplastic chemotherapy: Secondary | ICD-10-CM | POA: Diagnosis not present

## 2021-05-19 LAB — MAGNESIUM: Magnesium: 2 mg/dL (ref 1.7–2.4)

## 2021-05-19 LAB — CMP (CANCER CENTER ONLY)
ALT: 17 U/L (ref 0–44)
AST: 23 U/L (ref 15–41)
Albumin: 3.4 g/dL — ABNORMAL LOW (ref 3.5–5.0)
Alkaline Phosphatase: 56 U/L (ref 38–126)
Anion gap: 6 (ref 5–15)
BUN: 8 mg/dL (ref 8–23)
CO2: 27 mmol/L (ref 22–32)
Calcium: 8.6 mg/dL — ABNORMAL LOW (ref 8.9–10.3)
Chloride: 108 mmol/L (ref 98–111)
Creatinine: 0.91 mg/dL (ref 0.44–1.00)
GFR, Estimated: >60 mL/min (ref >60)
Glucose, Bld: 142 mg/dL — ABNORMAL HIGH (ref 70–99)
Potassium: 4.8 mmol/L (ref 3.5–5.1)
Sodium: 141 mmol/L (ref 135–145)
Total Bilirubin: 0.4 mg/dL (ref 0.3–1.2)
Total Protein: 6.5 g/dL (ref 6.5–8.1)

## 2021-05-19 LAB — CBC WITH DIFFERENTIAL (CANCER CENTER ONLY)
Abs Immature Granulocytes: 0.26 10*3/uL — ABNORMAL HIGH (ref 0.00–0.07)
Basophils Absolute: 0 10*3/uL (ref 0.0–0.1)
Basophils Relative: 1 %
Eosinophils Absolute: 0.1 10*3/uL (ref 0.0–0.5)
Eosinophils Relative: 2 %
HCT: 31.8 % — ABNORMAL LOW (ref 36.0–46.0)
Hemoglobin: 10.4 g/dL — ABNORMAL LOW (ref 12.0–15.0)
Immature Granulocytes: 7 %
Lymphocytes Relative: 8 %
Lymphs Abs: 0.3 10*3/uL — ABNORMAL LOW (ref 0.7–4.0)
MCH: 30.4 pg (ref 26.0–34.0)
MCHC: 32.7 g/dL (ref 30.0–36.0)
MCV: 93 fL (ref 80.0–100.0)
Monocytes Absolute: 0.6 10*3/uL (ref 0.1–1.0)
Monocytes Relative: 15 %
Neutro Abs: 2.6 10*3/uL (ref 1.7–7.7)
Neutrophils Relative %: 67 %
Platelet Count: 134 10*3/uL — ABNORMAL LOW (ref 150–400)
RBC: 3.42 MIL/uL — ABNORMAL LOW (ref 3.87–5.11)
RDW: 17.6 % — ABNORMAL HIGH (ref 11.5–15.5)
WBC Count: 3.9 10*3/uL — ABNORMAL LOW (ref 4.0–10.5)
nRBC: 0 % (ref 0.0–0.2)

## 2021-05-19 LAB — URINALYSIS, COMPLETE (UACMP) WITH MICROSCOPIC
Bilirubin Urine: NEGATIVE
Glucose, UA: NEGATIVE mg/dL
Ketones, ur: NEGATIVE mg/dL
Nitrite: NEGATIVE
Protein, ur: 30 mg/dL — AB
RBC / HPF: >50 RBC/hpf — ABNORMAL HIGH (ref 0–5)
Specific Gravity, Urine: 1.01 (ref 1.005–1.030)
WBC, UA: >50 WBC/hpf — ABNORMAL HIGH (ref 0–5)
pH: 6 (ref 5.0–8.0)

## 2021-05-19 LAB — SAMPLE TO BLOOD BANK

## 2021-05-19 NOTE — Progress Notes (Signed)
?[place]   ?[phone] [phone]   ?Clinic Follow up Note  ? ?Patient Care Team: ?[name], MD as PCP - General (Family Medicine) ?Cameron Sprang, MD as Consulting Physician (Neurology) ?Truitt Merle, MD as Consulting Physician (Oncology) ?Earl Gala, Deliah Goody, RN as Sales executive (Oncology) ? ?Date of Service:  05/19/2021 ? ?CHIEF COMPLAINT: f/u of anal cancer ? ?CURRENT THERAPY:  ?Surveillance ? ?ASSESSMENT & PLAN:  ?Lorraine Gilbert is a 88 y.o. female with  ? ?1. Anal Squamous Cell Carcinoma, cT1N0M0 ?-initially presented with palpable left groin lymph node in 01/2018. Biopsy on 02/27/18 showed metastatic squamous cell carcinoma, unknown primary. PET scan 03/23/18 also didn't show a primary. Colonoscopy and GYN work up were negative for malignancy  ?-she was treated with radiation therapy under Dr. Sondra Come 6/17-6/30/20. ?-she presented back with constipation in 09/2020. Rectal exam showed a questionable abnormality. Sigmoidoscopy on 01/30/21 with Dr. Percell Miller showed a small tuft of tissue above the anal verge. Pathology showed high-grade squamous intraepithelial lesion/anal intraepithelial neoplasia. ?-she was referred to Dr. Hilliard Clark and underwent anal biopsy in the OR on 03/05/21. Per OR note, ulcerated lesion in left posterolateral anal canal extending from anal verge to roughly 1.5cm proximal. Pathology from rectal mass confirmed invasive squamous cell carcinoma, moderately differentiated, MMR normal. ?-PET scan 03/13/21 was negative for metastasis ?-She began concurrent chemoradiation with 5-FU and mitomycin (on weeks 1 and 5) on 03/30/21. Last chemo dose 04/27/21 and last radiation on 05/07/21. She overall tolerated chemotherapy well.  ?-she was admitted on 05/12/21 for partial small bowel obstruction. She has recovered well and is feeling better today. ?-labs reviewed, her hgb is down to 10.4, and her WBC remains slightly low at 3.9. Physical exam shows her skin is healing  well, mild ulceration present. ?-we will plan for restaging PET in 2.5 months ?  ?2. chemoRT toxicities: diarrhea, skin toxicity ?-Dr. Sondra Come gave the patient suppositories to help as patient has pain with bearing down/bowel movement/urination. ?-We will arrange for urinalysis today to rule out any urinary tract infection; however, I suspect that the pain with urination is from the radiation as well as some of the superficial ulcerations in the genital area. Initial urine culture was contaminated, repeat was obtained today. ?  ?3.  Hypertension, hyperlipidemia, atrial fibrillation, DM (not on meds), depression ?-on lexapro 10 mg.  ?-f/u with PCP and will monitor her close on chemoRT ?  ?4. Right lower rib pain ?-Patient has been endorsing focal bony tenderness over the lower lateral right rib for several month, started before her cancer diagnosis  ?-There is no overlying skin changes.  There is no tenderness to palpation of the abdomen. ?-rib x-ray on 05/04/21 was negative. ?  ?  ?Plan: ?-f/u in 2.5 months, with PET scan several days before ? ? ?No problem-specific Assessment & Plan notes found for this encounter. ? ? ?SUMMARY OF ONCOLOGIC HISTORY: ?Oncology History  ?Metastatic squamous cell carcinoma involving lymph node with unknown primary site [facility])  ?02/06/2018 Imaging  ? Impression CT abdomen and pelvis ?1.  Enlarged left groin lymph node which is of uncertain etiology. This would be amenable to percutaneous sampling if deemed clinically appropriate. ?2.  Hepatic steatosis. Focal area of low-attenuation in the far lateral left hepatic lobe, not clearly seen on the prior exam. Consider liver ultrasound for further evaluation.  ?3.  Mild L1 compression deformity which is new. ? ?  ?02/28/2018 Pathology Results  ? Left groin lymph node, needle core biopsy: ?      -  Metastatic squamous cell carcinoma, see comment ? ?P16 stain is strongly positive, suggestive of cervical or anogenital origin in this  location. ?Immunohistochemical stains for CK5, p63, and pan cytokeratin are positive. ER is weakly positive. CK7 and CK20 are negative. ? ?  ?03/15/2018 Pathology Results  ? 1. Cervix, biopsy, 12 o'clock ?- BENIGN SQUAMOUS MUCOSA WITH INFLAMMATION. ?- NO DYSPLASIA OR MALIGNANCY. ?2. Endocervix, curettage ?- BENIGN SQUAMOUS EPITHELIUM. ? ?  ?03/23/2018 PET scan  ? 1. Isolated intensely hypermetabolic enlarged LEFT inguinal lymph node. No clear primary identified. ?2. No abnormal activity associated with the vagina or anorectal tissue. ?3. No hypermetabolic lymph nodes in the pelvis. ?4. Two adjacent nodules in the RIGHT upper lobe with very low metabolic activity. These are not favored primary malignancies. Recommend follow-up per Fleischner criteria. Non-contrast chest CT at 6-12 months is recommended.  ? ?  ?07/05/2018 - 07/18/2018 Radiation Therapy  ? Left inguinal area/nodes treated to 30 Gy in 10 fractions ?  ?09/22/2020 Imaging  ? EXAM: CT ABDOMEN PELVIS W IV CONTRAST  ? ?IMPRESSION:  ?1. Multicystic enhancing right adnexal lesion with mild surrounding stranding. Given short interval since recent CT 3 weeks prior, this is more suspicious for acute findings such as torsion or infection, less likely neoplasm. Recommend endovaginal ultrasound for further evaluation and follow-up to resolution.  ?2. Other stable chronic findings.  ?  ?01/30/2021 Pathology Results  ? DIAGNOSIS  ? ?SMALL  TUFT OF TISSUE ABOVE ANAL VERGE, ENDOSCOPIC BIOPSIES:  ? - HIGH-GRADE SQUAMOUS INTRAEPITHELIAL LESION/ANAL INTRAEPITHELIAL NEOPLASIA 2-3 OUT OF 3 (HGSIL/AIN 2-3).  ? - SEE COMMENT.  ?(MPL002)  ? ?COMMENT  ?MORPHOLOGICALLY THERE IS AT LEAST TWO THIRDS THICKNESS INVOLVEMENT BY DYSPLASTIC EPITHELIUM WITH MID-LEVEL MITOSES SECONDARY HOWEVER DUE TO POOR ORIENTATION AND MORPHOLOGIC ASSESSMENT IS 2 SERVICE MUCOSA IS ONLY PRESENT IN FOCAL AREAS MORPHOLOGICALLY THIS REPRESENTS AT LEAST AIN 2 AND MOST LIKELY AIN 3.  IN EITHER RESPECT THE FINDINGS ARE  CONSISTENT WITH HIGH-GRADE SQUAMOUS INTRAEPITHELIAL LESION WHICH IS ALSO CONFIRMED BY STRONGLY BLOCK LIKE P16 IMMUNOHISTOCHEMICAL  POSITIVITY.  ?  ?03/05/2021 Pathology Results  ? Final Diagnosis    ?Rectal mass, biopsy: ?-Invasive squamous cell carcinoma, moderately differentiated  ? ?Addendum 1    ?The carcinoma was analyzed by [facility] for DNA mismatch repair proteins.  The neoplasm retained nuclear expression of all four mismatch repair proteins, MLH1, PMS2, MSH2, MSH6.  Controls worked appropriately.  ? ?  ?Anal cancer ([place])  ?01/30/2021 Cancer Staging  ? Staging form: Anus, AJCC V9 ?- Clinical stage from 01/30/2021: Stage I (cT1, cN0, cM0) - Signed by Truitt Merle, MD on 03/11/2021 ?Stage prefix: Initial diagnosis ?Histologic grade (G): G2 ?Histologic grading system: 4 grade system ? ?  ?03/11/2021 Initial Diagnosis  ? Anal cancer ([place]) ? ?  ?03/30/2021 -  Chemotherapy  ? Patient is on Treatment Plan : ANUS Mitomycin D1,28 / 5FU D1-4, 28-31 q32d  ? ?  ?  ? ? ? ?INTERVAL HISTORY:  ?Lorraine Gilbert is here for a follow up of anal cancer. She was last seen by PA Cassie on 05/04/21. She presents to the clinic alone. She notes her son is here and waiting for her. ?She tells me about her recent hospital visit. She reports she is feeling much better now. ?  ?All other systems were reviewed with the patient and are negative. ? ?MEDICAL HISTORY:  ?Past Medical History:  ?Diagnosis Date  ? A-fib ([place])   ? Abnormal heart rhythm   ? Allergic rhinitis   ?  Anxiety   ? Asthma   ? Diabetes mellitus without complication ([place])   ? Type II  ? GERD (gastroesophageal reflux disease)   ? Headache   ? History of radiation therapy 07/05/2018-07/18/2018  ? left pelvis        Dr Gery Pray  ? Hypertension   ? Neuromuscular disorder ([place])   ? Siactic pain in right leg  ? Osteopenia   ? Rheumatoid arthritis ([place])   ? Rotator cuff tear arthropathy   ? Right  ? Vertigo   ? ? ?SURGICAL HISTORY: ?Past Surgical History:  ?Procedure Laterality Date  ? BIOPSY   12/13/2018  ? Procedure: BIOPSY;  Surgeon: Wonda Horner, MD;  Location: [facility];  Service: Endoscopy;;  ? CATARACT EXTRACTION    ? bilateral  ? COLONOSCOPY WITH PROPOFOL N/A 12/13/2018  ? Procedure: COLONOSCOPY W

## 2021-05-22 ENCOUNTER — Other Ambulatory Visit: Payer: Self-pay | Admitting: Hematology

## 2021-05-22 ENCOUNTER — Telehealth: Payer: Self-pay

## 2021-05-22 LAB — URINE CULTURE: Culture: 10000 — AB

## 2021-05-22 MED ORDER — CIPROFLOXACIN HCL 500 MG PO TABS: 500.0000 mg | ORAL_TABLET | Freq: Two times a day (BID) | ORAL | 0 refills | Status: DC

## 2021-05-22 NOTE — Telephone Encounter (Signed)
Spoke with pt via telephone to inform pt that her urine test came back positive.  Informed pt that Dr. Burr Medico has prescribed her Cipro an Abx that she would like for the pt to take for 5 days.  Informed pt that her pharmacy will call her when the medication is ready for pickup.  Pt asked if the prescription was sent to [facility].  Informed pt that the prescription was sent to [facility].  Pt requested prescription to be sent to [facility] because she's no longer using [facility].  Sent prescription to [facility].  Canceled prescription with [facility].   ?

## 2021-05-24 DIAGNOSIS — J45909 Unspecified asthma, uncomplicated: Secondary | ICD-10-CM | POA: Diagnosis not present

## 2021-05-25 ENCOUNTER — Encounter: Payer: Self-pay | Admitting: Radiation Oncology

## 2021-05-26 ENCOUNTER — Telehealth: Payer: Self-pay | Admitting: Hematology

## 2021-05-26 NOTE — Telephone Encounter (Signed)
Scheduled follow-up appointments per 5/2 los. Patient is aware. Mailed calendar. ?

## 2021-05-29 ENCOUNTER — Telehealth: Payer: Self-pay | Admitting: *Deleted

## 2021-05-29 ENCOUNTER — Telehealth: Payer: Self-pay | Admitting: Radiation Oncology

## 2021-05-29 NOTE — Telephone Encounter (Signed)
RETURNED PATIENT'S PHONE CALL, LVM FOR A RETURN CALL 

## 2021-05-29 NOTE — Telephone Encounter (Signed)
xxxxx 

## 2021-05-29 NOTE — Telephone Encounter (Signed)
Spoke with pt verify appt time for 5/15 ?

## 2021-06-01 ENCOUNTER — Other Ambulatory Visit: Payer: Self-pay

## 2021-06-01 ENCOUNTER — Ambulatory Visit
Admission: RE | Admit: 2021-06-01 | Discharge: 2021-06-01 | Disposition: A | Discharge status: Home / Self Care | Payer: Medicare Other | Source: Ambulatory Visit | Attending: Radiation Oncology | Admitting: Radiation Oncology

## 2021-06-01 ENCOUNTER — Encounter: Payer: Self-pay | Admitting: Radiation Oncology

## 2021-06-01 DIAGNOSIS — Z8589 Personal history of malignant neoplasm of other organs and systems: Secondary | ICD-10-CM | POA: Insufficient documentation

## 2021-06-01 DIAGNOSIS — Z7901 Long term (current) use of anticoagulants: Secondary | ICD-10-CM | POA: Insufficient documentation

## 2021-06-01 DIAGNOSIS — Z923 Personal history of irradiation: Secondary | ICD-10-CM | POA: Insufficient documentation

## 2021-06-01 DIAGNOSIS — C210 Malignant neoplasm of anus, unspecified: Secondary | ICD-10-CM | POA: Insufficient documentation

## 2021-06-01 DIAGNOSIS — R5383 Other fatigue: Secondary | ICD-10-CM | POA: Diagnosis not present

## 2021-06-01 DIAGNOSIS — Z9221 Personal history of antineoplastic chemotherapy: Secondary | ICD-10-CM | POA: Diagnosis not present

## 2021-06-01 DIAGNOSIS — Z79899 Other long term (current) drug therapy: Secondary | ICD-10-CM | POA: Insufficient documentation

## 2021-06-01 LAB — CBC WITH DIFFERENTIAL (CANCER CENTER ONLY)
Abs Immature Granulocytes: 0.15 10*3/uL — ABNORMAL HIGH (ref 0.00–0.07)
Basophils Absolute: 0 10*3/uL (ref 0.0–0.1)
Basophils Relative: 1 %
Eosinophils Absolute: 0.4 10*3/uL (ref 0.0–0.5)
Eosinophils Relative: 9 %
HCT: 34.9 % — ABNORMAL LOW (ref 36.0–46.0)
Hemoglobin: 11.5 g/dL — ABNORMAL LOW (ref 12.0–15.0)
Immature Granulocytes: 3 %
Lymphocytes Relative: 22 %
Lymphs Abs: 0.9 10*3/uL (ref 0.7–4.0)
MCH: 30.8 pg (ref 26.0–34.0)
MCHC: 33 g/dL (ref 30.0–36.0)
MCV: 93.6 fL (ref 80.0–100.0)
Monocytes Absolute: 0.8 10*3/uL (ref 0.1–1.0)
Monocytes Relative: 19 %
Neutro Abs: 2.1 10*3/uL (ref 1.7–7.7)
Neutrophils Relative %: 46 %
Platelet Count: 202 10*3/uL (ref 150–400)
RBC: 3.73 MIL/uL — ABNORMAL LOW (ref 3.87–5.11)
RDW: 19.1 % — ABNORMAL HIGH (ref 11.5–15.5)
WBC Count: 4.4 10*3/uL (ref 4.0–10.5)
nRBC: 0 % (ref 0.0–0.2)

## 2021-06-01 NOTE — Progress Notes (Incomplete)
?  Radiation Oncology         (336) [phone] ?________________________________ ? ?Patient Name: Lorraine Gilbert ?MRN: [id number] ?DOB: Jun 26, 1932 ?Referring Physician: HILLER DAVID J ?Date of Service: 05/07/2021 ? [facility], [place] ? ?                                                      End Of Treatment Note ? ?Diagnoses: C21.0-Malignant neoplasm of anus, unspecified ?C77.4-Secondary and unspecified malignant neoplasm of inguinal and lower limb lymph nodes ?Z78.89-Personal history of malignant neoplasm of other organs and systems ? ?Cancer Staging: Newly diagnosed stage I (cT1, cN0, cM0) anal cancer ?  ?Prior history of: metastatic squamous cell carcinoma presenting in the left inguinal area (unknown primary site) ? ?Intent: Curative ? ?Radiation Treatment Dates: 03/30/2021 through 05/07/2021 ?Site Technique Total Dose (Gy) Dose per Fx (Gy) Completed Fx Beam Energies  ?Anus: Anus IMRT 50.4/50.4 1.8 28/28 6X  ? ?Narrative: The patient did not tolerate radiation treatment all that well. During her final weekly treatment check on 05/05/21, the patient reported anal pain, vaginal pain, fatigue, skin irritation, blood tinged rectal mucus, and severe dysuria.  ? ?She did finish up her second cycle of chemotherapy late last week.  She has had some mucositis with management given through medical oncology.  She also obtained A&E ointment which has been somewhat helpful.  She does have a lot of dysuria and recommended she pick up some Azo.  If culture returns positive she will be placed on antibiotics for UTI.   ? ?Addendum: urine culture collected was re-cultured on 05/19/21 which showed multi-drug resistant flavobacterium meningosepticum ? ?Plan: The patient will follow-up with radiation oncology in one month  . ? ?________________________________________________ ?----------------------------------- ? ?Blair Promise, PhD, MD ? ?This document serves as a record of services personally performed by Gery Pray, MD. It was created on his behalf by Roney Mans, a trained medical scribe. The creation of this record is based on the scribe's personal observations and the provider's statements to them. This document has been checked and approved by the attending provider. ? ?

## 2021-06-01 NOTE — Progress Notes (Signed)
?Radiation Oncology         (336) [phone] ?________________________________ ? ?Name: Lorraine Gilbert MRN: [id number]  ?Date: 06/01/2021  DOB: [birth year] ? ?Follow-Up Visit Note ? ?CC: [name], MD  [name], MD ? ?  ICD-10-CM   ?1. Anal cancer ([place])  C21.0 CBC with Differential ([place] Only)  ?  ? ? ?Diagnosis: Newly diagnosed stage I (cT1, cN0, cM0) anal cancer ?  ?Prior history of: metastatic squamous cell carcinoma presenting in the left inguinal area (unknown primary site)  ? ?Interval Since Last Radiation: 25 days  ? ?Intent: Curative ? ?Radiation Treatment Dates: 03/30/2021 through 05/07/2021 ?Site Technique Total Dose (Gy) Dose per Fx (Gy) Completed Fx Beam Energies  ?Anus: Anus/pelvis IMRT 50.4/50.4 1.8 28/28 6X  ? ? ?Narrative:  The patient returns today for routine follow-up. The patient did not tolerate radiation treatment all that well. During her final weekly treatment check on 05/05/21, the patient reported anal pain, vaginal pain, fatigue, skin irritation, blood tinged rectal mucus, and severe dysuria.  Her mucositis was managed through medical oncology.  She also obtained A&E ointment which was somewhat helpful.  I recommended she pick up some Azo for her dysuria and ordered urine cultures. Culture has to be repeated on 05/19/21 which showed multi-drug resistant flavobacterium meningosepticum. Results were called in to Dr. Burr Medico who prescribed her a 5 day course of cipro. ? ?In the interval, the patient completed chemotherapy consisting of Mitomycin on 04/27/21 under the care of Dr. Burr Medico. Overall, the patient tolerated systemic treatment relatively well other than ongoing symptoms noted above.  ? ?Shortly after completing RT, the patient was admitted on 05/12/21 for a partial small bowel obstruction. She has fully recovered from this issue but is still quite fatigued.  She was admitted to [facility] for management of this issue. ? ?During her most recent follow up with Dr. Burr Medico on  05/19/21, labs were reviewed which showed hgb down to 10.4, and slightly low WBC at 3.9. Physical exam showed her skin to be healing well with mild ulceration present. Repeat urine culture was obtained during this visit showing results noted above. The patient was also noted to endorse ongoing focal bony tenderness over the left lateral right rib for several months (started before her cancer diagnosis). Rib-x-ray on 05/04/21 was negative, and Dr. Burr Medico did not palpate any tenderness over this area.  ? ?She reports some occasional tarry stools recently.  In light of this we will send her up to the lab to assure stability of her hemoglobin and hematocrit. ? ? ?Allergies:  is allergic to ibuprofen. ? ?Meds: ?Current Outpatient Medications  ?Medication Sig Dispense Refill  ? ACCU-CHEK AVIVA PLUS test strip 1 each as directed.    ? Accu-Chek Softclix Lancets lancets 1 each by Other route as directed.    ? ACETAMINOPHEN 8 HOUR PO Take 1 tablet by mouth as needed.    ? albuterol (PROVENTIL) (2.5 MG/3ML) 0.083% nebulizer solution SMARTSIG:1 Vial(s) Via Nebulizer Every 4-6 Hours PRN    ? Alcohol Swabs (ALCOHOL WIPES) 70 % PADS DropSafe Alcohol Prep Pads    ? amiodarone (PACERONE) 100 MG tablet Take 100 mg by mouth daily.    ? Blood Glucose Calibration (ACCU-CHEK AVIVA) SOLN     ? Blood Glucose Monitoring Suppl (TRUE METRIX AIR GLUCOSE METER) w/Device KIT See admin instructions.    ? ciprofloxacin (CIPRO) 500 MG tablet Take 1 tablet (500 mg total) by mouth 2 (two) times daily. 10 tablet 0  ?  clopidogrel (PLAVIX) 75 MG tablet Take 75 mg by mouth daily.    ? diclofenac Sodium (VOLTAREN) 1 % GEL Apply topically.    ? ELIQUIS 2.5 MG TABS tablet Take 2.5 mg by mouth 2 (two) times daily.    ? escitalopram (LEXAPRO) 10 MG tablet Take 10 mg by mouth daily.    ? hydrocortisone (ANUSOL-HC) 25 MG suppository Place 1 suppository (25 mg total) rectally 2 (two) times daily. 12 suppository 0  ? levalbuterol (XOPENEX) 1.25 MG/3ML nebulizer  solution Inhale into the lungs.    ? TRELEGY ELLIPTA 200-62.5-25 MCG/ACT AEPB Take 1 puff by mouth daily.    ? Vitamin D, Ergocalciferol, 50000 units CAPS 1 capsule    ? folic acid (FOLVITE) 1 MG tablet Take by mouth. (Patient not taking: Reported on 06/01/2021)    ? mirtazapine (REMERON) 7.5 MG tablet Take 1 tablet (7.5 mg total) by mouth at bedtime. (Patient not taking: Reported on 06/01/2021) 30 tablet 0  ? nitroGLYCERIN (NITROSTAT) 0.4 MG SL tablet Place 0.4 mg under the tongue every 5 (five) minutes as needed for chest pain. (Patient not taking: Reported on 03/30/2021)    ? ondansetron (ZOFRAN) 4 MG tablet Take 1 tablet (4 mg total) by mouth every 8 (eight) hours as needed for nausea or vomiting. (Patient not taking: Reported on 03/30/2021) 20 tablet 1  ? polyethylene glycol powder (GLYCOLAX/MIRALAX) 17 GM/SCOOP powder Take by mouth. (Patient not taking: Reported on 06/01/2021)    ? prochlorperazine (COMPAZINE) 10 MG tablet Take 1 tablet (10 mg total) by mouth every 6 (six) hours as needed for nausea or vomiting. (Patient not taking: Reported on 03/30/2021) 30 tablet 1  ? rosuvastatin (CRESTOR) 5 MG tablet Take by mouth. (Patient not taking: Reported on 06/01/2021)    ? ?No current facility-administered medications for this encounter.  ? ? ?Physical Findings: ?The patient is in no acute distress. Patient is alert and oriented. ? temperature is 97.9 ?F (36.6 ?C). Her blood pressure is 136/49 (abnormal) and her pulse is 72. Her respiration is 19 and oxygen saturation is 100%. .   Lungs are clear to auscultation bilaterally. Heart has regular rate and rhythm. No palpable cervical, supraclavicular, or axillary adenopathy. Abdomen soft, non-tender, normal bowel sounds.  Pelvic exam not performed today in light of recent exam by Dr. Burr Medico late last week. ? ? ?Lab Findings: ?Lab Results  ?Component Value Date  ? WBC 4.4 06/01/2021  ? HGB 11.5 (L) 06/01/2021  ? HCT 34.9 (L) 06/01/2021  ? MCV 93.6 06/01/2021  ? PLT 202  06/01/2021  ? ? ?Radiographic Findings: ?DG Ribs Unilateral Right ? ?Result Date: 05/04/2021 ?CLINICAL DATA:  Right lower rib pain for 2 weeks.  No injury. EXAM: RIGHT RIBS - 2 VIEW COMPARISON:  Chest radiograph 12/17/2020 and CT chest 05/28/2020. FINDINGS: No acute osseous abnormality. Right shoulder arthroplasty. Mild interstitial coarsening at the base of the right lung, better evaluated on 05/28/2020. IMPRESSION: No acute findings. Electronically Signed   By: Lorin Picket M.D.   On: 05/04/2021 13:23   ? ?Impression:  Newly diagnosed stage I (cT1, cN0, cM0) anal cancer ?  ?Prior history of: metastatic squamous cell carcinoma presenting in the left inguinal area (unknown primary site)  ? ?The patient is recovering from the effects of radiation.  She continues to have problems with fatigue in light of recent hospitalization but this is improving.  She feels much better in terms of her abdominal symptoms since discharge. ? ?Today the patient was given a  vaginal dilator and instructions on its use.  Recommended she hold off on initiation of this use for another month given her recent events. ? ?Plan: Routine follow-up in 3 months.  Recommend that she follow-up with Dr. Evalyn Casco at [facility] for detailed exam once she is healed from her treatments.  He is the gastroenterologist that performed the biopsy which confirmed anal carcinoma.  Also asked that she report to him if she continues to have problems with dark stools. ? ?Addendum: Blood work from today shows improvement in hemoglobin up to 11.5 up from 10.4 13 days ago. ? ? ?____________________________________ ? ?Blair Promise, PhD, MD ? ?This document serves as a record of services personally performed by Gery Pray, MD. It was created on his behalf by Roney Mans, a trained medical scribe. The creation of this record is based on the scribe's personal observations and the provider's statements to them. This document has been checked and approved by  the attending provider. ? ?

## 2021-06-01 NOTE — Progress Notes (Signed)
Lorraine Gilbert is here today for follow up post radiation to the pelvic. ? ?They completed their radiation on: 05/07/21 ? ?Does the patient complain of any of the following: ? ?Pain:Yes, right abdomen.  ?Abdominal bloating: No ?Diarrhea/Constipation: Patient reports increased diarrhea. Patient also having incontinence of stool. ?Nausea/Vomiting: No ?Vaginal Discharge: No ?Blood in Urine or Stool: Patient report black, tarry stools. Patient currently on Eliqus 2.5 mg BID. ?Urinary Issues (dysuria/incomplete emptying/ incontinence/ increased frequency/urgency): Patient currently on Cipro for UTI.  ?Post radiation skin changes: Patient reports skin to pelvis has improved  ?Weight:  ?Wt Readings from Last 3 Encounters:  ?05/19/21 134 lb 6.4 oz (61 kg)  ?05/04/21 133 lb 11.2 oz (60.6 kg)  ?04/27/21 135 lb 1.6 oz (61.3 kg)  ?  ? ?Additional comments if applicable:Patient reports having rectal burning and itching. Patient continues to notice blood when she wipes after having a bowel movement. ?  ?Patient also reports going to er ( [facility]) on 05/12/21 due to increased fatigue and diarrhea. Patient was discharged with diagnosis of small bowel obstruction.  Patient reports feeling very weak and dizzy.  ? ?BP (!) 136/49   Pulse 72   Temp 97.9 ?F (36.6 ?C)   Resp 19   SpO2 100%   ?

## 2021-06-02 ENCOUNTER — Telehealth: Payer: Self-pay | Admitting: *Deleted

## 2021-06-02 ENCOUNTER — Telehealth: Payer: Self-pay

## 2021-06-02 NOTE — Telephone Encounter (Signed)
Called patient to make aware of recent CBC result.  Made patient aware that hemoglobin has improved. Patient voiced understanding.  ?

## 2021-06-02 NOTE — Telephone Encounter (Signed)
CALLED PATIENT'S DAUGHTER- Lorraine Gilbert TO INFORM OF FU APPT. WITH DR. [place] ON 08-31-21 @ 3 PM, SPOKE WITH PATIENT'S DAUGHTER- Lorraine Gilbert AND SHE IS AWARE OF THIS APPT. ?

## 2021-06-03 ENCOUNTER — Telehealth: Payer: Self-pay | Admitting: *Deleted

## 2021-06-03 NOTE — Telephone Encounter (Signed)
CALLED PATIENT TO INFORM OF FU APPT. BEING MOVED UP TO 08-05-21 @ 11:45 AM, SPOKE WITH PATIENT AND SHE IS GOOD WITH THIS APPT. ?

## 2021-06-07 IMAGING — MG DIGITAL SCREENING BILAT W/ TOMO W/ CAD
8 series · 8 of 24 positions shown · non-contrast
Comparison: Previous exam(s).

CLINICAL DATA: Screening.

EXAM:
DIGITAL SCREENING BILATERAL MAMMOGRAM WITH TOMO AND CAD

[L MLO synth-2D]
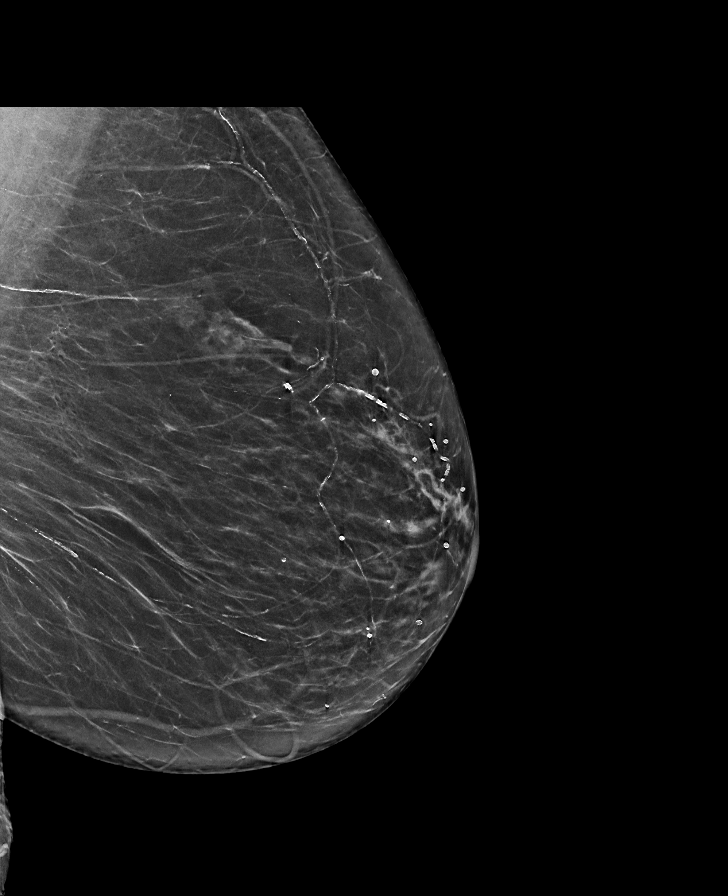

[R CC synth-2D]
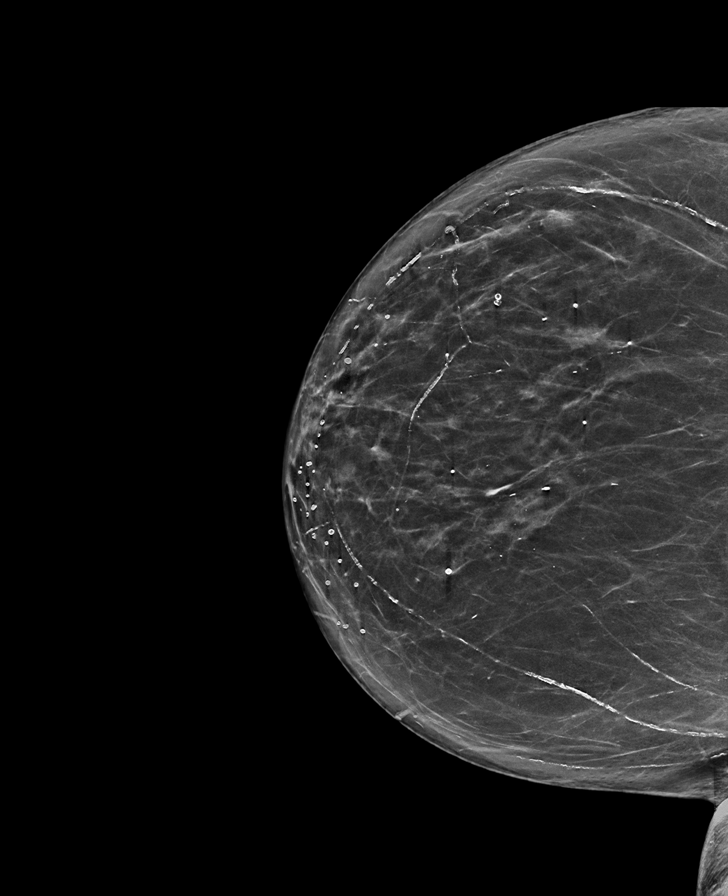

[L CC synth-2D]
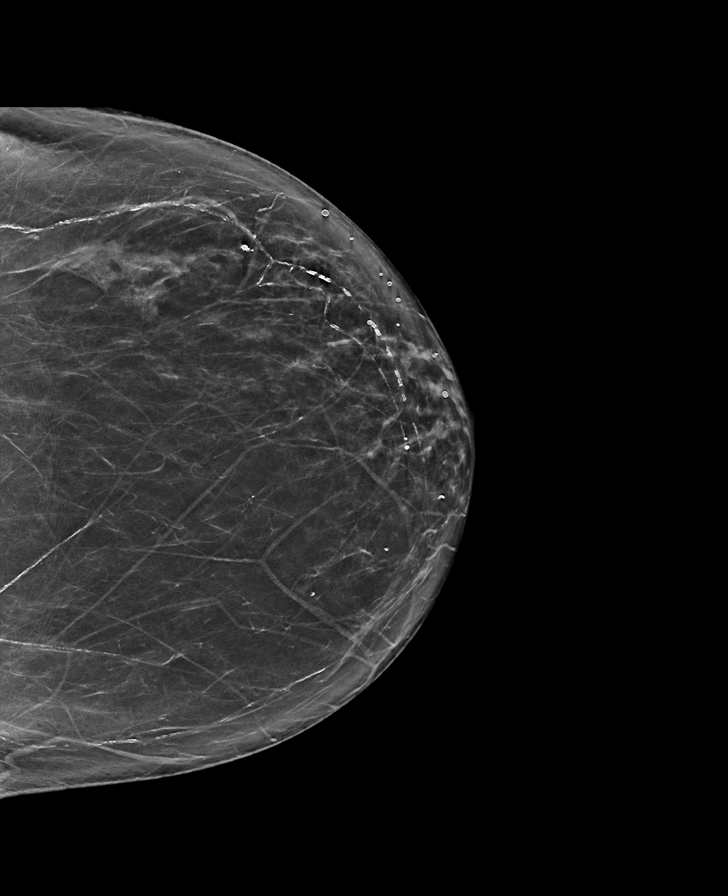

[R MLO synth-2D]
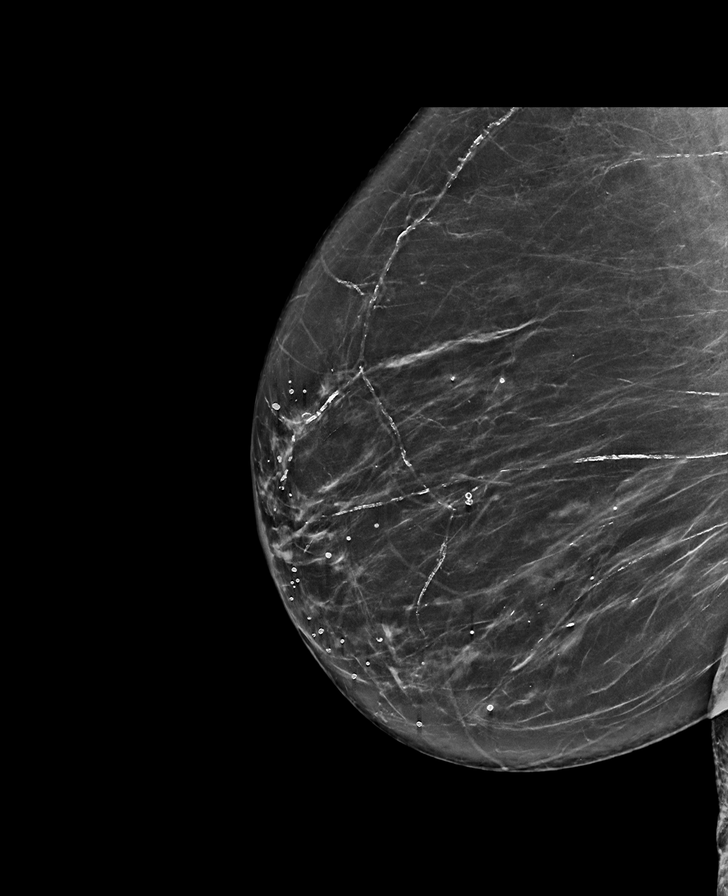

[L CC tomo · tomo slice 35/70.0]
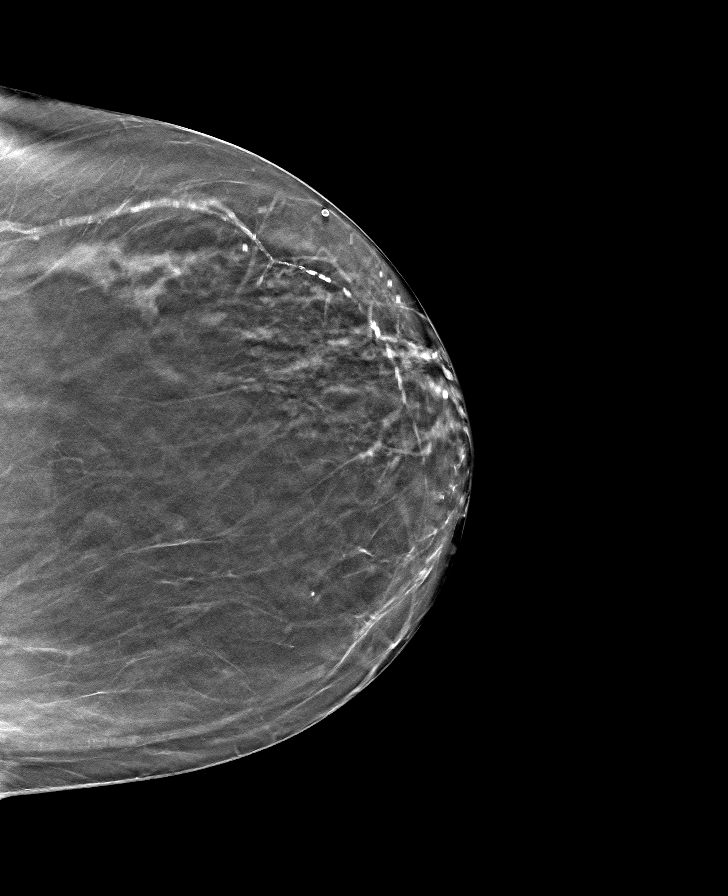

[R CC tomo · tomo slice 36/71.0]
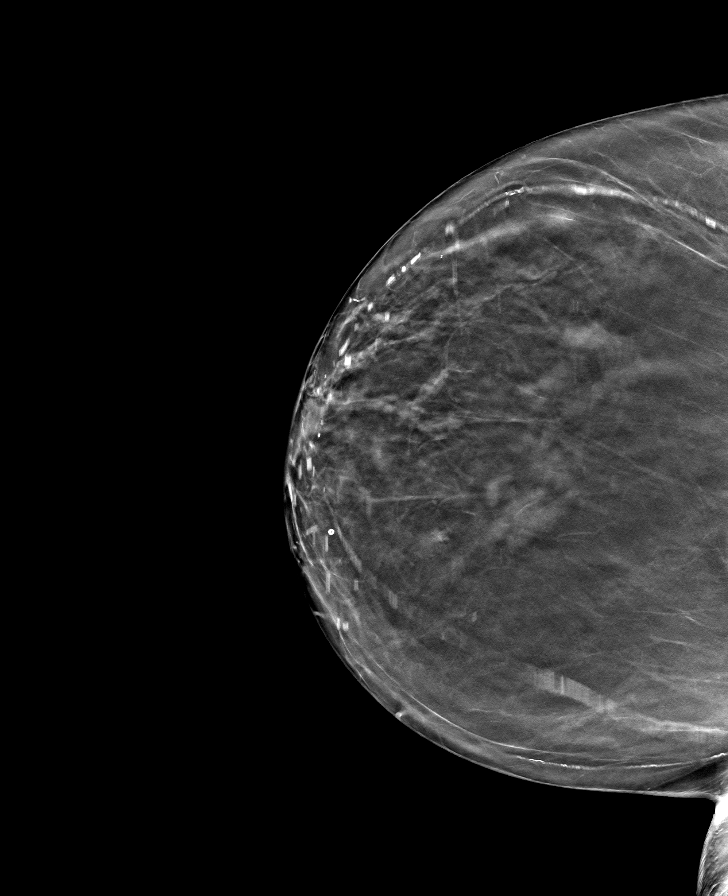

[L MLO tomo · tomo slice 37/72.0]
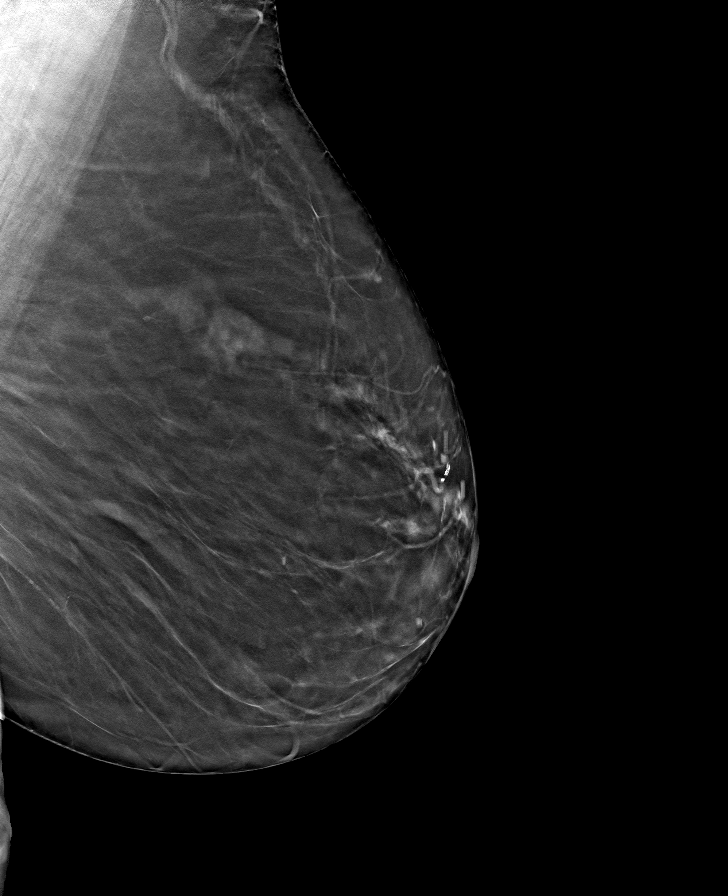

[R MLO tomo · tomo slice 37/72.0]
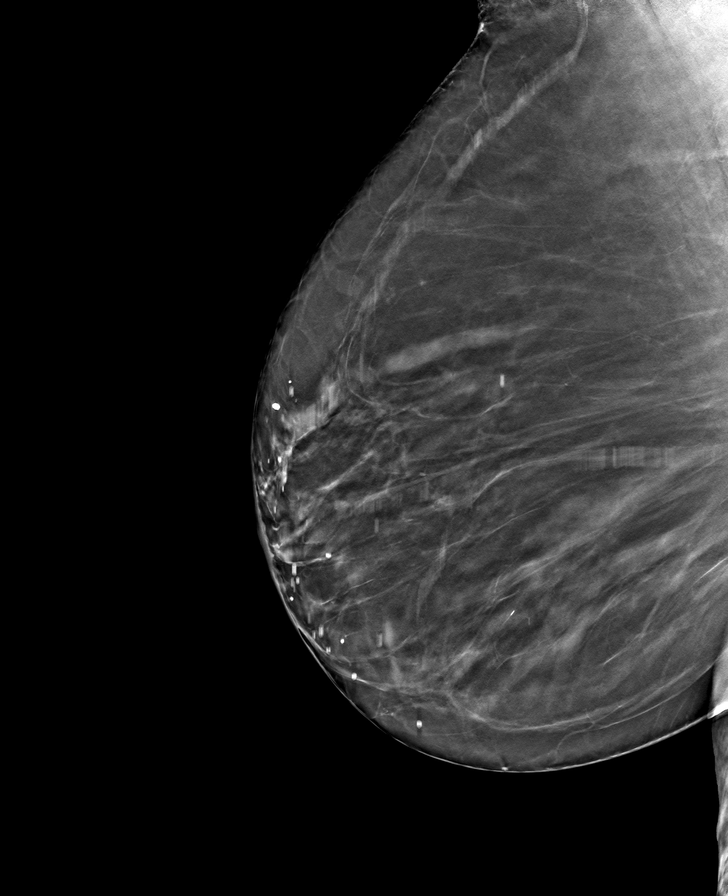

[8 of 24 positions shown; findings below may reference images not displayed]

ACR Breast Density Category b: There are scattered areas of
fibroglandular density.
FINDINGS: There are no findings suspicious for malignancy. Images were
processed with CAD.
IMPRESSION: No mammographic evidence of malignancy. A result letter of this
screening mammogram will be mailed directly to the patient.

RECOMMENDATION:
Screening mammogram in one year. (Code:[id number])

BI-RADS CATEGORY  1: Negative.

## 2021-06-24 DIAGNOSIS — J45909 Unspecified asthma, uncomplicated: Secondary | ICD-10-CM | POA: Diagnosis not present

## 2021-06-25 DIAGNOSIS — C210 Malignant neoplasm of anus, unspecified: Secondary | ICD-10-CM | POA: Diagnosis not present

## 2021-07-10 DIAGNOSIS — I4891 Unspecified atrial fibrillation: Secondary | ICD-10-CM | POA: Diagnosis not present

## 2021-07-10 DIAGNOSIS — C801 Malignant (primary) neoplasm, unspecified: Secondary | ICD-10-CM | POA: Diagnosis not present

## 2021-07-10 DIAGNOSIS — C779 Secondary and unspecified malignant neoplasm of lymph node, unspecified: Secondary | ICD-10-CM | POA: Diagnosis not present

## 2021-07-10 DIAGNOSIS — I700 Atherosclerosis of aorta: Secondary | ICD-10-CM | POA: Diagnosis not present

## 2021-07-10 DIAGNOSIS — C210 Malignant neoplasm of anus, unspecified: Secondary | ICD-10-CM | POA: Diagnosis not present

## 2021-07-10 DIAGNOSIS — D6869 Other thrombophilia: Secondary | ICD-10-CM | POA: Diagnosis not present

## 2021-07-10 DIAGNOSIS — E1169 Type 2 diabetes mellitus with other specified complication: Secondary | ICD-10-CM | POA: Diagnosis not present

## 2021-07-10 DIAGNOSIS — R051 Acute cough: Secondary | ICD-10-CM | POA: Diagnosis not present

## 2021-07-10 DIAGNOSIS — I25119 Atherosclerotic heart disease of native coronary artery with unspecified angina pectoris: Secondary | ICD-10-CM | POA: Diagnosis not present

## 2021-07-10 DIAGNOSIS — R197 Diarrhea, unspecified: Secondary | ICD-10-CM | POA: Diagnosis not present

## 2021-07-10 DIAGNOSIS — I739 Peripheral vascular disease, unspecified: Secondary | ICD-10-CM | POA: Diagnosis not present

## 2021-07-24 DIAGNOSIS — J45909 Unspecified asthma, uncomplicated: Secondary | ICD-10-CM | POA: Diagnosis not present

## 2021-07-28 DIAGNOSIS — R053 Chronic cough: Secondary | ICD-10-CM | POA: Diagnosis not present

## 2021-07-28 DIAGNOSIS — J4540 Moderate persistent asthma, uncomplicated: Secondary | ICD-10-CM | POA: Diagnosis not present

## 2021-07-28 DIAGNOSIS — J300 Vasomotor rhinitis: Secondary | ICD-10-CM | POA: Diagnosis not present

## 2021-07-28 DIAGNOSIS — B370 Candidal stomatitis: Secondary | ICD-10-CM | POA: Diagnosis not present

## 2021-07-31 ENCOUNTER — Other Ambulatory Visit: Payer: Self-pay

## 2021-07-31 ENCOUNTER — Inpatient Hospital Stay: Payer: Medicare Other | Attending: Hematology

## 2021-07-31 DIAGNOSIS — I4891 Unspecified atrial fibrillation: Secondary | ICD-10-CM | POA: Insufficient documentation

## 2021-07-31 DIAGNOSIS — E119 Type 2 diabetes mellitus without complications: Secondary | ICD-10-CM | POA: Diagnosis not present

## 2021-07-31 DIAGNOSIS — C210 Malignant neoplasm of anus, unspecified: Secondary | ICD-10-CM | POA: Insufficient documentation

## 2021-07-31 DIAGNOSIS — I10 Essential (primary) hypertension: Secondary | ICD-10-CM | POA: Insufficient documentation

## 2021-07-31 DIAGNOSIS — C774 Secondary and unspecified malignant neoplasm of inguinal and lower limb lymph nodes: Secondary | ICD-10-CM | POA: Insufficient documentation

## 2021-07-31 LAB — CMP (CANCER CENTER ONLY)
ALT: 16 U/L (ref 0–44)
AST: 24 U/L (ref 15–41)
Albumin: 3.9 g/dL (ref 3.5–5.0)
Alkaline Phosphatase: 67 U/L (ref 38–126)
Anion gap: 5 (ref 5–15)
BUN: 16 mg/dL (ref 8–23)
CO2: 27 mmol/L (ref 22–32)
Calcium: 9.3 mg/dL (ref 8.9–10.3)
Chloride: 109 mmol/L (ref 98–111)
Creatinine: 0.91 mg/dL (ref 0.44–1.00)
GFR, Estimated: >60 mL/min (ref >60)
Glucose, Bld: 115 mg/dL — ABNORMAL HIGH (ref 70–99)
Potassium: 4.7 mmol/L (ref 3.5–5.1)
Sodium: 141 mmol/L (ref 135–145)
Total Bilirubin: 0.5 mg/dL (ref 0.3–1.2)
Total Protein: 7.3 g/dL (ref 6.5–8.1)

## 2021-07-31 LAB — CBC WITH DIFFERENTIAL (CANCER CENTER ONLY)
Abs Immature Granulocytes: 0.04 10*3/uL (ref 0.00–0.07)
Basophils Absolute: 0 10*3/uL (ref 0.0–0.1)
Basophils Relative: 0 %
Eosinophils Absolute: 0.2 10*3/uL (ref 0.0–0.5)
Eosinophils Relative: 3 %
HCT: 36.6 % (ref 36.0–46.0)
Hemoglobin: 12.2 g/dL (ref 12.0–15.0)
Immature Granulocytes: 1 %
Lymphocytes Relative: 26 %
Lymphs Abs: 1.4 10*3/uL (ref 0.7–4.0)
MCH: 31.7 pg (ref 26.0–34.0)
MCHC: 33.3 g/dL (ref 30.0–36.0)
MCV: 95.1 fL (ref 80.0–100.0)
Monocytes Absolute: 0.8 10*3/uL (ref 0.1–1.0)
Monocytes Relative: 14 %
Neutro Abs: 3 10*3/uL (ref 1.7–7.7)
Neutrophils Relative %: 56 %
Platelet Count: 218 10*3/uL (ref 150–400)
RBC: 3.85 MIL/uL — ABNORMAL LOW (ref 3.87–5.11)
RDW: 15.9 % — ABNORMAL HIGH (ref 11.5–15.5)
WBC Count: 5.3 10*3/uL (ref 4.0–10.5)
nRBC: 0 % (ref 0.0–0.2)

## 2021-08-03 ENCOUNTER — Encounter (HOSPITAL_COMMUNITY): Payer: Medicare Other

## 2021-08-05 ENCOUNTER — Inpatient Hospital Stay: Payer: Medicare Other | Admitting: Hematology

## 2021-08-05 ENCOUNTER — Ambulatory Visit
Admission: RE | Admit: 2021-08-05 | Discharge: 2021-08-05 | Disposition: A | Discharge status: Home / Self Care | Payer: Medicare Other | Source: Ambulatory Visit | Attending: Radiation Oncology | Admitting: Radiation Oncology

## 2021-08-05 ENCOUNTER — Telehealth: Payer: Self-pay

## 2021-08-05 NOTE — Telephone Encounter (Signed)
This nurse reached out to patient and made her aware per provider that her scheduled appointment for today will be cancelled.  Provider wants to wait until the PET has been completed.  Patient acknowledged understanding and knows to expect a call from scheduling.  No further questions or concerns noted at this time.

## 2021-08-07 ENCOUNTER — Telehealth: Payer: Self-pay | Admitting: Hematology

## 2021-08-07 NOTE — Telephone Encounter (Signed)
.  Called patient to schedule appointment per 7/19 inbaket, patient is aware of date and time.

## 2021-08-10 ENCOUNTER — Other Ambulatory Visit: Payer: Self-pay

## 2021-08-10 ENCOUNTER — Telehealth: Payer: Self-pay

## 2021-08-10 ENCOUNTER — Encounter (HOSPITAL_COMMUNITY): Payer: Medicare Other

## 2021-08-10 NOTE — Telephone Encounter (Signed)
This nurse received a call from this patient requesting to know why her PET scan has been cancelled.  This nurse explained that the insurance denied authorization and we are working to get the scan covered. Advised that once the scan has been approved she will be contacted for rescheduling.  Patient acknowledged understanding.  No further questions at this time.

## 2021-08-12 NOTE — Progress Notes (Signed)
Radiation Oncology         [phone] ________________________________  Name: Lorraine Gilbert MRN: [id number]  Date: 08/13/2021  DOB: 10/22/1932  Follow-Up Visit Note  CC: [name], MD  [name], MD  No diagnosis found.  Diagnosis:  Newly diagnosed stage I (cT1, cN0, cM0) anal cancer   Prior history of: metastatic squamous cell carcinoma presenting in the left inguinal area (unknown primary site)    Interval Since Last Radiation: 2 months and 29 days   Intent: Curative  Radiation Treatment Dates: 03/30/2021 through 05/07/2021 Site Technique Total Dose (Gy) Dose per Fx (Gy) Completed Fx Beam Energies  Anus: Anus/pelvis IMRT 50.4/50.4 1.8 28/28 6X    Narrative:  The patient returns today for routine 3 month follow-up, she was last seen here for follow up on 06/01/21. Since her last visit, the patient followed up with Dr. Hilliard Clark [facility]) on 06/25/21. During which time, the patient was found to have no signs of recurrence of examination. The patient will return to Dr. Hilliard Clark in 1-2 months for an anoscopy. The patient did report occasional anal itching and some burning at the time of this visit, but stated that it had improved. She otherwise denied any incontinence.     ***                            Allergies:  is allergic to ibuprofen.  Meds: Current Outpatient Medications  Medication Sig Dispense Refill   ACCU-CHEK AVIVA PLUS test strip 1 each as directed.     Accu-Chek Softclix Lancets lancets 1 each by Other route as directed.     ACETAMINOPHEN 8 HOUR PO Take 1 tablet by mouth as needed.     albuterol (PROVENTIL) (2.5 MG/3ML) 0.083% nebulizer solution SMARTSIG:1 Vial(s) Via Nebulizer Every 4-6 Hours PRN     Alcohol Swabs (ALCOHOL WIPES) 70 % PADS DropSafe Alcohol Prep Pads     amiodarone (PACERONE) 100 MG tablet Take 100 mg by mouth daily.     Blood Glucose Calibration (ACCU-CHEK AVIVA) SOLN      Blood Glucose Monitoring Suppl (TRUE METRIX AIR GLUCOSE METER)  w/Device KIT See admin instructions.     ciprofloxacin (CIPRO) 500 MG tablet Take 1 tablet (500 mg total) by mouth 2 (two) times daily. 10 tablet 0   clopidogrel (PLAVIX) 75 MG tablet Take 75 mg by mouth daily.     diclofenac Sodium (VOLTAREN) 1 % GEL Apply topically.     ELIQUIS 2.5 MG TABS tablet Take 2.5 mg by mouth 2 (two) times daily.     escitalopram (LEXAPRO) 10 MG tablet Take 10 mg by mouth daily.     folic acid (FOLVITE) 1 MG tablet Take by mouth. (Patient not taking: Reported on 06/01/2021)     hydrocortisone (ANUSOL-HC) 25 MG suppository Place 1 suppository (25 mg total) rectally 2 (two) times daily. 12 suppository 0   levalbuterol (XOPENEX) 1.25 MG/3ML nebulizer solution Inhale into the lungs.     mirtazapine (REMERON) 7.5 MG tablet Take 1 tablet (7.5 mg total) by mouth at bedtime. (Patient not taking: Reported on 06/01/2021) 30 tablet 0   nitroGLYCERIN (NITROSTAT) 0.4 MG SL tablet Place 0.4 mg under the tongue every 5 (five) minutes as needed for chest pain. (Patient not taking: Reported on 03/30/2021)     ondansetron (ZOFRAN) 4 MG tablet Take 1 tablet (4 mg total) by mouth every 8 (eight) hours as needed for nausea or vomiting. (Patient not  taking: Reported on 03/30/2021) 20 tablet 1   polyethylene glycol powder (GLYCOLAX/MIRALAX) 17 GM/SCOOP powder Take by mouth. (Patient not taking: Reported on 06/01/2021)     prochlorperazine (COMPAZINE) 10 MG tablet Take 1 tablet (10 mg total) by mouth every 6 (six) hours as needed for nausea or vomiting. (Patient not taking: Reported on 03/30/2021) 30 tablet 1   rosuvastatin (CRESTOR) 5 MG tablet Take by mouth. (Patient not taking: Reported on 06/01/2021)     TRELEGY ELLIPTA 200-62.5-25 MCG/ACT AEPB Take 1 puff by mouth daily.     Vitamin D, Ergocalciferol, 50000 units CAPS 1 capsule     No current facility-administered medications for this encounter.    Physical Findings: The patient is in no acute distress. Patient is alert and oriented.  vitals  were not taken for this visit. .  No significant changes. Lungs are clear to auscultation bilaterally. Heart has regular rate and rhythm. No palpable cervical, supraclavicular, or axillary adenopathy. Abdomen soft, non-tender, normal bowel sounds. ***   Lab Findings: Lab Results  Component Value Date   WBC 5.3 07/31/2021   HGB 12.2 07/31/2021   HCT 36.6 07/31/2021   MCV 95.1 07/31/2021   PLT 218 07/31/2021    Radiographic Findings: No results found.  Impression:  Newly diagnosed stage I (cT1, cN0, cM0) anal cancer   Prior history of: metastatic squamous cell carcinoma presenting in the left inguinal area (unknown primary site)    The patient is recovering from the effects of radiation.  ***  Plan:  ***   *** minutes of total time was spent for this patient encounter, including preparation, face-to-face counseling with the patient and coordination of care, physical exam, and documentation of the encounter. ____________________________________  Blair Promise, PhD, MD  This document serves as a record of services personally performed by Gery Pray, MD. It was created on his behalf by Roney Mans, a trained medical scribe. The creation of this record is based on the scribe's personal observations and the provider's statements to them. This document has been checked and approved by the attending provider.

## 2021-08-13 ENCOUNTER — Ambulatory Visit
Admission: RE | Admit: 2021-08-13 | Discharge: 2021-08-13 | Disposition: A | Discharge status: Home / Self Care | Payer: Medicare Other | Source: Ambulatory Visit | Attending: Radiation Oncology | Admitting: Radiation Oncology

## 2021-08-13 ENCOUNTER — Other Ambulatory Visit: Payer: Self-pay

## 2021-08-13 ENCOUNTER — Telehealth: Payer: Self-pay

## 2021-08-13 ENCOUNTER — Other Ambulatory Visit: Payer: Self-pay | Admitting: Hematology

## 2021-08-13 ENCOUNTER — Encounter: Payer: Self-pay | Admitting: Radiation Oncology

## 2021-08-13 DIAGNOSIS — R630 Anorexia: Secondary | ICD-10-CM | POA: Diagnosis not present

## 2021-08-13 DIAGNOSIS — C210 Malignant neoplasm of anus, unspecified: Secondary | ICD-10-CM | POA: Diagnosis not present

## 2021-08-13 DIAGNOSIS — Z79899 Other long term (current) drug therapy: Secondary | ICD-10-CM | POA: Insufficient documentation

## 2021-08-13 DIAGNOSIS — Z923 Personal history of irradiation: Secondary | ICD-10-CM | POA: Insufficient documentation

## 2021-08-13 DIAGNOSIS — R634 Abnormal weight loss: Secondary | ICD-10-CM | POA: Diagnosis not present

## 2021-08-13 DIAGNOSIS — Z7902 Long term (current) use of antithrombotics/antiplatelets: Secondary | ICD-10-CM | POA: Insufficient documentation

## 2021-08-13 MED ORDER — MEGESTROL ACETATE 400 MG/10ML PO SUSP: 400.0000 mg | Freq: Every day | ORAL | 0 refills | Status: DC

## 2021-08-13 MED ORDER — HYDROCORTISONE ACETATE 25 MG RE SUPP: 25.0000 mg | Freq: Two times a day (BID) | RECTAL | 0 refills | Status: DC

## 2021-08-13 NOTE — Progress Notes (Signed)
Lorraine Gilbert is here today for follow up post radiation to the pelvic.  They completed their radiation on: 05/07/21  Does the patient complain of any of the following:  Pain:Yes, reports having pain to rectum, neck and shoulders. Patient rates pain a 8/10. Pain relieved with Tylenol.  Abdominal bloating: Yes Diarrhea/Constipation: Constipation Nausea/Vomiting: No Blood in Urine or Stool: Yes, blood in stool Urinary Issues (dysuria/incomplete emptying/ incontinence/ increased frequency/urgency): No Post radiation skin changes: Patient states rectum feels swollen.  Appetite- Poor. Patient's pcp asking if it is okay for patient to take Megace due to loss of appetite.  Weight:  Wt Readings from Last 3 Encounters:  08/13/21 130 lb 12.8 oz (59.3 kg)  05/19/21 134 lb 6.4 oz (61 kg)  05/04/21 133 lb 11.2 oz (60.6 kg)      Additional comments if applicable: Patient states she received a call from insurance company stating that PET scan has been approved.   BP (!) 138/53 (BP Location: Left Arm, Patient Position: Sitting, Cuff Size: Normal)   Pulse 71   Temp 97.7 F (36.5 C)   Resp 18   Ht '5\' 2"'$  (1.575 m)   Wt 130 lb 12.8 oz (59.3 kg)   SpO2 100%   BMI 23.92 kg/m

## 2021-08-13 NOTE — Telephone Encounter (Signed)
Called to make patient aware of PET scan on 08/14/21 @ 130-pm. Patient also told to be NPO 6 hours prior to scan and to hold insulin and limit carbohydrate intake. Patient voiced understanding.

## 2021-08-14 ENCOUNTER — Other Ambulatory Visit: Payer: Self-pay

## 2021-08-14 ENCOUNTER — Ambulatory Visit (HOSPITAL_COMMUNITY)
Admission: RE | Admit: 2021-08-14 | Discharge: 2021-08-14 | Disposition: A | Discharge status: Home / Self Care | Payer: Medicare Other | Source: Ambulatory Visit | Attending: Hematology | Admitting: Hematology

## 2021-08-14 DIAGNOSIS — C210 Malignant neoplasm of anus, unspecified: Secondary | ICD-10-CM | POA: Diagnosis not present

## 2021-08-14 LAB — GLUCOSE, CAPILLARY: Glucose-Capillary: 111 mg/dL — ABNORMAL HIGH (ref 70–99)

## 2021-08-14 MED ORDER — FLUDEOXYGLUCOSE F - 18 (FDG) INJECTION
6.5100 | Freq: Once | INTRAVENOUS | Status: AC
  Administered 2021-08-14: 6.51 via INTRAVENOUS

## 2021-08-17 ENCOUNTER — Inpatient Hospital Stay (HOSPITAL_BASED_OUTPATIENT_CLINIC_OR_DEPARTMENT_OTHER): Payer: Medicare Other | Admitting: Hematology

## 2021-08-17 ENCOUNTER — Other Ambulatory Visit: Payer: Self-pay

## 2021-08-17 VITALS — BP 162/61 | HR 69 | Temp 97.9°F | Resp 19 | Ht 62.0 in | Wt 131.8 lb

## 2021-08-17 DIAGNOSIS — E119 Type 2 diabetes mellitus without complications: Secondary | ICD-10-CM | POA: Diagnosis not present

## 2021-08-17 DIAGNOSIS — I4891 Unspecified atrial fibrillation: Secondary | ICD-10-CM | POA: Diagnosis not present

## 2021-08-17 DIAGNOSIS — C210 Malignant neoplasm of anus, unspecified: Secondary | ICD-10-CM

## 2021-08-17 DIAGNOSIS — I10 Essential (primary) hypertension: Secondary | ICD-10-CM | POA: Diagnosis not present

## 2021-08-17 DIAGNOSIS — C774 Secondary and unspecified malignant neoplasm of inguinal and lower limb lymph nodes: Secondary | ICD-10-CM | POA: Diagnosis not present

## 2021-08-17 MED ORDER — MEGESTROL ACETATE 400 MG/10ML PO SUSP: 400.0000 mg | Freq: Every day | ORAL | 0 refills | Status: DC

## 2021-08-17 NOTE — Progress Notes (Signed)
[place]   Telephone:(336) [phone] Fax:(336) [phone]   Clinic Follow up Note   Patient Care Team: [name], MD as PCP - General (Family Medicine) Cameron Sprang, MD as Consulting Physician (Neurology) Truitt Merle, MD as Consulting Physician (Oncology) Royston Bake, RN as Oncology Nurse Navigator (Oncology)  Date of Service:  08/17/2021  CHIEF COMPLAINT: f/u of anal cancer  CURRENT THERAPY:  Surveillance  ASSESSMENT & PLAN:  Lorraine Gilbert is a 88 y.o. female with   1. Anal Squamous Cell Carcinoma, cT1N0M0 -initially presented with palpable left groin lymph node in 01/2018. Biopsy on 02/27/18 showed metastatic squamous cell carcinoma, unknown primary. PET scan 03/23/18 also didn't show a primary. Colonoscopy and GYN work up were negative for malignancy  -she was treated with radiation therapy under Dr. Sondra Come 6/17-6/30/20. -she presented back with constipation in 09/2020. Rectal exam showed a questionable abnormality. Sigmoidoscopy on 01/30/21 with Dr. Percell Miller showed a small tuft of tissue above the anal verge. Pathology showed high-grade squamous intraepithelial lesion/anal intraepithelial neoplasia. -she was referred to Dr. Hilliard Clark and underwent anal biopsy in the OR on 03/05/21. Per OR note, ulcerated lesion in left posterolateral anal canal extending from anal verge to roughly 1.5cm proximal. Pathology from rectal mass confirmed invasive squamous cell carcinoma, moderately differentiated, MMR normal. -PET scan 03/13/21 was negative for metastasis -She began concurrent chemoradiation with 5-FU and mitomycin (on weeks 1 and 5) on 03/30/21. Last chemo dose 04/27/21 and last radiation on 05/07/21. She overall tolerated chemotherapy well.  -most recent visit with Dr. Hilliard Clark on 06/25/21 showed no evidence of recurrence on DRE. She is scheduled for anoscope on 08/28/21. -post-treatment PET on 08/14/21 showed treatment response, no evidence of metastatic disease. I reviewed the  results and images with them today. -labs from 07/31/21 reviewed, overall improved off chemoRT. Exam deferred given recent exam with Dr. Sondra Come. I will see her back in 3 months.   2. Rectal burning and itching, Poor appetite -she reports recurrent burning and itching with bowel movements and blood when she wipes. She was prescribed Anusol suppositories 7/27 but notes they are expensive. I discussed she can also try OTC hemorrhoid cream. -she was recommended Megace by rad onc for her low appetite; I called in for her today.   3.  Hypertension, hyperlipidemia, atrial fibrillation, DM (not on meds), depression -on lexapro 10 mg.  -f/u with PCP and will monitor her close on chemoRT     Plan: -f/u and anoscope with Dr. Hilliard Clark 8/11 -lab and f/u in 3 months   No problem-specific Assessment & Plan notes found for this encounter.   SUMMARY OF ONCOLOGIC HISTORY: Oncology History  Metastatic squamous cell carcinoma involving lymph node with unknown primary site [facility])  02/06/2018 Imaging   Impression CT abdomen and pelvis 1.  Enlarged left groin lymph node which is of uncertain etiology. This would be amenable to percutaneous sampling if deemed clinically appropriate. 2.  Hepatic steatosis. Focal area of low-attenuation in the far lateral left hepatic lobe, not clearly seen on the prior exam. Consider liver ultrasound for further evaluation.  3.  Mild L1 compression deformity which is new.   02/28/2018 Pathology Results   Left groin lymph node, needle core biopsy:       -Metastatic squamous cell carcinoma, see comment  P16 stain is strongly positive, suggestive of cervical or anogenital origin in this location. Immunohistochemical stains for CK5, p63, and pan cytokeratin are positive. ER is weakly positive. CK7 and CK20 are negative.  03/15/2018 Pathology Results   1. Cervix, biopsy, 12 o'clock - BENIGN SQUAMOUS MUCOSA WITH INFLAMMATION. - NO DYSPLASIA OR MALIGNANCY. 2. Endocervix,  curettage - BENIGN SQUAMOUS EPITHELIUM.   03/23/2018 PET scan   1. Isolated intensely hypermetabolic enlarged LEFT inguinal lymph node. No clear primary identified. 2. No abnormal activity associated with the vagina or anorectal tissue. 3. No hypermetabolic lymph nodes in the pelvis. 4. Two adjacent nodules in the RIGHT upper lobe with very low metabolic activity. These are not favored primary malignancies. Recommend follow-up per Fleischner criteria. Non-contrast chest CT at 6-12 months is recommended.    07/05/2018 - 07/18/2018 Radiation Therapy   Left inguinal area/nodes treated to 30 Gy in 10 fractions   09/22/2020 Imaging   EXAM: CT ABDOMEN PELVIS W IV CONTRAST   IMPRESSION:  1. Multicystic enhancing right adnexal lesion with mild surrounding stranding. Given short interval since recent CT 3 weeks prior, this is more suspicious for acute findings such as torsion or infection, less likely neoplasm. Recommend endovaginal ultrasound for further evaluation and follow-up to resolution.  2. Other stable chronic findings.    01/30/2021 Pathology Results   DIAGNOSIS   SMALL  TUFT OF TISSUE ABOVE ANAL VERGE, ENDOSCOPIC BIOPSIES:   - HIGH-GRADE SQUAMOUS INTRAEPITHELIAL LESION/ANAL INTRAEPITHELIAL NEOPLASIA 2-3 OUT OF 3 (HGSIL/AIN 2-3).   - SEE COMMENT.  (MPL002)   COMMENT  MORPHOLOGICALLY THERE IS AT LEAST TWO THIRDS THICKNESS INVOLVEMENT BY DYSPLASTIC EPITHELIUM WITH MID-LEVEL MITOSES SECONDARY HOWEVER DUE TO POOR ORIENTATION AND MORPHOLOGIC ASSESSMENT IS 2 SERVICE MUCOSA IS ONLY PRESENT IN FOCAL AREAS MORPHOLOGICALLY THIS REPRESENTS AT LEAST AIN 2 AND MOST LIKELY AIN 3.  IN EITHER RESPECT THE FINDINGS ARE CONSISTENT WITH HIGH-GRADE SQUAMOUS INTRAEPITHELIAL LESION WHICH IS ALSO CONFIRMED BY STRONGLY BLOCK LIKE P16 IMMUNOHISTOCHEMICAL  POSITIVITY.    03/05/2021 Pathology Results   Final Diagnosis    Rectal mass, biopsy: -Invasive squamous cell carcinoma, moderately differentiated   Addendum 1     The carcinoma was analyzed by [facility] for DNA mismatch repair proteins.  The neoplasm retained nuclear expression of all four mismatch repair proteins, MLH1, PMS2, MSH2, MSH6.  Controls worked appropriately.     Anal cancer ([place])  01/30/2021 Cancer Staging   Staging form: Anus, AJCC V9 - Clinical stage from 01/30/2021: Stage I (cT1, cN0, cM0) - Signed by Truitt Merle, MD on 03/11/2021 Stage prefix: Initial diagnosis Histologic grade (G): G2 Histologic grading system: 4 grade system   03/11/2021 Initial Diagnosis   Anal cancer ([place])   03/30/2021 -  Chemotherapy   Patient is on Treatment Plan : ANUS Mitomycin D1,28 / 5FU D1-4, 28-31 q32d        INTERVAL HISTORY:  Lorraine Gilbert is here for a follow up of anal cancer. She was last seen by me on 05/19/21, with f/u in rad onc in the interim. She presents to the clinic accompanied by her son. She reports she is having some recurrent itching and burning with BM and blood on the toilet tissue. She explains the itching/burning is only with BM and the blood is only when she wipes. She tells me DRE with Dr. Sondra Come was benign.   All other systems were reviewed with the patient and are negative.  MEDICAL HISTORY:  Past Medical History:  Diagnosis Date   A-fib ([place])    Abnormal heart rhythm    Allergic rhinitis    Anxiety    Asthma    Diabetes mellitus without complication ([place])    Type II   GERD (  gastroesophageal reflux disease)    Headache    History of radiation therapy 07/05/2018-07/18/2018   left pelvis        Dr Gery Pray   History of radiation therapy    Anus- 03/30/21-05/07/21- Dr. Gery Pray   Hypertension    Neuromuscular disorder [facility])    Siactic pain in right leg   Osteopenia    Rheumatoid arthritis ([place])    Rotator cuff tear arthropathy    Right   Vertigo     SURGICAL HISTORY: Past Surgical History:  Procedure Laterality Date   BIOPSY  12/13/2018   Procedure: BIOPSY;  Surgeon: Wonda Horner, MD;  Location: [facility] ENDOSCOPY;   Service: Endoscopy;;   CATARACT EXTRACTION     bilateral   COLONOSCOPY WITH PROPOFOL N/A 12/13/2018   Procedure: COLONOSCOPY WITH PROPOFOL;  Surgeon: Wonda Horner, MD;  Location: [facility] ENDOSCOPY;  Service: Endoscopy;  Laterality: N/A;   ESOPHAGOGASTRODUODENOSCOPY N/A 12/13/2018   Procedure: ESOPHAGOGASTRODUODENOSCOPY (EGD);  Surgeon: Wonda Horner, MD;  Location: [facility];  Service: Endoscopy;  Laterality: N/A;   EYE SURGERY     POLYPECTOMY  12/13/2018   Procedure: POLYPECTOMY;  Surgeon: Wonda Horner, MD;  Location: [facility] ENDOSCOPY;  Service: Endoscopy;;   REVERSE SHOULDER ARTHROPLASTY Right 08/04/2017   REVERSE SHOULDER ARTHROPLASTY Right 08/04/2017   Procedure: RIGHT REVERSE SHOULDER ARTHROPLASTY;  Surgeon: Justice Britain, MD;  Location: [facility];  Service: Orthopedics;  Laterality: Right;   TUBAL LIGATION      I have reviewed the social history and family history with the patient and they are unchanged from previous note.  ALLERGIES:  is allergic to ibuprofen.  MEDICATIONS:  Current Outpatient Medications  Medication Sig Dispense Refill   ACCU-CHEK AVIVA PLUS test strip 1 each as directed.     Accu-Chek Softclix Lancets lancets 1 each by Other route as directed.     ACETAMINOPHEN 8 HOUR PO Take 1 tablet by mouth as needed.     albuterol (PROVENTIL) (2.5 MG/3ML) 0.083% nebulizer solution SMARTSIG:1 Vial(s) Via Nebulizer Every 4-6 Hours PRN     Alcohol Swabs (ALCOHOL WIPES) 70 % PADS DropSafe Alcohol Prep Pads     amiodarone (PACERONE) 100 MG tablet Take 100 mg by mouth daily.     Blood Glucose Calibration (ACCU-CHEK AVIVA) SOLN      Blood Glucose Monitoring Suppl (TRUE METRIX AIR GLUCOSE METER) w/Device KIT See admin instructions.     clopidogrel (PLAVIX) 75 MG tablet Take 75 mg by mouth daily.     diclofenac Sodium (VOLTAREN) 1 % GEL Apply topically.     ELIQUIS 2.5 MG TABS tablet Take 2.5 mg by mouth 2 (two) times daily.     escitalopram (LEXAPRO) 10 MG tablet Take 10 mg by mouth  daily.     folic acid (FOLVITE) 1 MG tablet Take by mouth. (Patient not taking: Reported on 06/01/2021)     hydrocortisone (ANUSOL-HC) 25 MG suppository Place 1 suppository (25 mg total) rectally 2 (two) times daily. 12 suppository 0   levalbuterol (XOPENEX) 1.25 MG/3ML nebulizer solution Inhale into the lungs.     megestrol (MEGACE) 400 MG/10ML suspension Take 10 mLs (400 mg total) by mouth daily. 240 mL 0   mirtazapine (REMERON) 7.5 MG tablet Take 1 tablet (7.5 mg total) by mouth at bedtime. (Patient not taking: Reported on 06/01/2021) 30 tablet 0   nitroGLYCERIN (NITROSTAT) 0.4 MG SL tablet Place 0.4 mg under the tongue every 5 (five) minutes as needed for chest pain. (Patient not taking:  Reported on 03/30/2021)     ondansetron (ZOFRAN) 4 MG tablet Take 1 tablet (4 mg total) by mouth every 8 (eight) hours as needed for nausea or vomiting. (Patient not taking: Reported on 03/30/2021) 20 tablet 1   polyethylene glycol powder (GLYCOLAX/MIRALAX) 17 GM/SCOOP powder Take by mouth.     prochlorperazine (COMPAZINE) 10 MG tablet Take 1 tablet (10 mg total) by mouth every 6 (six) hours as needed for nausea or vomiting. (Patient not taking: Reported on 03/30/2021) 30 tablet 1   rosuvastatin (CRESTOR) 5 MG tablet Take by mouth.     TRELEGY ELLIPTA 200-62.5-25 MCG/ACT AEPB Take 1 puff by mouth daily.     Vitamin D, Ergocalciferol, 50000 units CAPS 1 capsule     No current facility-administered medications for this visit.    PHYSICAL EXAMINATION: ECOG PERFORMANCE STATUS: {CHL ONC ECOG PS:[phone]}  There were no vitals filed for this visit. Wt Readings from Last 3 Encounters:  08/13/21 130 lb 12.8 oz (59.3 kg)  05/19/21 134 lb 6.4 oz (61 kg)  05/04/21 133 lb 11.2 oz (60.6 kg)     GENERAL:alert, no distress and comfortable SKIN: skin color normal, no rashes or significant lesions EYES: normal, Conjunctiva are pink and non-injected, sclera clear  NEURO: alert & oriented x 3 with fluent  speech  LABORATORY DATA:  I have reviewed the data as listed    Latest Ref Rng & Units 07/31/2021   10:02 AM 06/01/2021    3:50 PM 05/19/2021    9:48 AM  CBC  WBC 4.0 - 10.5 K/uL 5.3  4.4  3.9   Hemoglobin 12.0 - 15.0 g/dL 12.2  11.5  10.4   Hematocrit 36.0 - 46.0 % 36.6  34.9  31.8   Platelets 150 - 400 K/uL 218  202  134         Latest Ref Rng & Units 07/31/2021   10:02 AM 05/19/2021    9:48 AM 05/04/2021   11:47 AM  CMP  Glucose 70 - 99 mg/dL 115  142  117   BUN 8 - 23 mg/dL _0 Creatinine 0.44 - 1.00 mg/dL 0.91  0.91  0.81   Sodium 135 - 145 mmol/L 141  141  141   Potassium 3.5 - 5.1 mmol/L 4.7  4.8  4.2   Chloride 98 - 111 mmol/L 109  108  108   CO2 22 - 32 mmol/L _1 Calcium 8.9 - 10.3 mg/dL 9.3  8.6  8.9   Total Protein 6.5 - 8.1 g/dL 7.3  6.5  6.9   Total Bilirubin 0.3 - 1.2 mg/dL 0.5  0.4  0.4   Alkaline Phos 38 - 126 U/L 67  56  51   AST 15 - 41 U/L _2 ALT 0 - 44 U/L _3 RADIOGRAPHIC STUDIES: I have personally reviewed the radiological images as listed and agreed with the findings in the report. No results found.    No orders of the defined types were placed in this encounter.  All questions were answered. The patient knows to call the clinic with any problems, questions or concerns. No barriers to learning was detected. The total time spent in the appointment was {CHL ONC TIME VISIT - [id number]}.     Aurea Graff 08/17/2021   I, Wilburn Mylar, am acting as scribe for Truitt Merle, MD.   {  Add scribe attestation statement}

## 2021-08-18 ENCOUNTER — Other Ambulatory Visit: Payer: Self-pay

## 2021-08-18 ENCOUNTER — Encounter: Payer: Self-pay | Admitting: Hematology

## 2021-08-20 ENCOUNTER — Telehealth: Payer: Self-pay | Admitting: Hematology

## 2021-08-20 NOTE — Telephone Encounter (Signed)
Left message with follow-up appointment per 7/31 los. 

## 2021-08-21 ENCOUNTER — Other Ambulatory Visit: Payer: Self-pay

## 2021-08-24 DIAGNOSIS — J45909 Unspecified asthma, uncomplicated: Secondary | ICD-10-CM | POA: Diagnosis not present

## 2021-08-28 DIAGNOSIS — K602 Anal fissure, unspecified: Secondary | ICD-10-CM | POA: Diagnosis not present

## 2021-08-28 DIAGNOSIS — C210 Malignant neoplasm of anus, unspecified: Secondary | ICD-10-CM | POA: Diagnosis not present

## 2021-08-31 ENCOUNTER — Ambulatory Visit: Payer: Self-pay | Admitting: Radiation Oncology

## 2021-08-31 DIAGNOSIS — Z5181 Encounter for therapeutic drug level monitoring: Secondary | ICD-10-CM | POA: Diagnosis not present

## 2021-08-31 DIAGNOSIS — I480 Paroxysmal atrial fibrillation: Secondary | ICD-10-CM | POA: Diagnosis not present

## 2021-08-31 DIAGNOSIS — Z79899 Other long term (current) drug therapy: Secondary | ICD-10-CM | POA: Diagnosis not present

## 2021-08-31 DIAGNOSIS — Z7901 Long term (current) use of anticoagulants: Secondary | ICD-10-CM | POA: Diagnosis not present

## 2021-08-31 DIAGNOSIS — Z96611 Presence of right artificial shoulder joint: Secondary | ICD-10-CM | POA: Diagnosis not present

## 2021-09-02 DIAGNOSIS — E1169 Type 2 diabetes mellitus with other specified complication: Secondary | ICD-10-CM | POA: Diagnosis not present

## 2021-09-02 DIAGNOSIS — E7800 Pure hypercholesterolemia, unspecified: Secondary | ICD-10-CM | POA: Diagnosis not present

## 2021-09-02 DIAGNOSIS — J45991 Cough variant asthma: Secondary | ICD-10-CM | POA: Diagnosis not present

## 2021-09-04 DIAGNOSIS — I739 Peripheral vascular disease, unspecified: Secondary | ICD-10-CM | POA: Diagnosis not present

## 2021-09-04 DIAGNOSIS — M353 Polymyalgia rheumatica: Secondary | ICD-10-CM | POA: Diagnosis not present

## 2021-09-04 DIAGNOSIS — I2510 Atherosclerotic heart disease of native coronary artery without angina pectoris: Secondary | ICD-10-CM | POA: Diagnosis not present

## 2021-09-04 DIAGNOSIS — Z885 Allergy status to narcotic agent status: Secondary | ICD-10-CM | POA: Diagnosis not present

## 2021-09-04 DIAGNOSIS — F32A Depression, unspecified: Secondary | ICD-10-CM | POA: Diagnosis not present

## 2021-09-04 DIAGNOSIS — Z955 Presence of coronary angioplasty implant and graft: Secondary | ICD-10-CM | POA: Diagnosis not present

## 2021-09-04 DIAGNOSIS — Z7901 Long term (current) use of anticoagulants: Secondary | ICD-10-CM | POA: Diagnosis not present

## 2021-09-04 DIAGNOSIS — E1139 Type 2 diabetes mellitus with other diabetic ophthalmic complication: Secondary | ICD-10-CM | POA: Diagnosis not present

## 2021-09-04 DIAGNOSIS — I110 Hypertensive heart disease with heart failure: Secondary | ICD-10-CM | POA: Diagnosis not present

## 2021-09-04 DIAGNOSIS — H42 Glaucoma in diseases classified elsewhere: Secondary | ICD-10-CM | POA: Diagnosis not present

## 2021-09-04 DIAGNOSIS — Z9841 Cataract extraction status, right eye: Secondary | ICD-10-CM | POA: Diagnosis not present

## 2021-09-04 DIAGNOSIS — H401131 Primary open-angle glaucoma, bilateral, mild stage: Secondary | ICD-10-CM | POA: Diagnosis not present

## 2021-09-04 DIAGNOSIS — I10 Essential (primary) hypertension: Secondary | ICD-10-CM | POA: Diagnosis not present

## 2021-09-04 DIAGNOSIS — J45909 Unspecified asthma, uncomplicated: Secondary | ICD-10-CM | POA: Diagnosis not present

## 2021-09-04 DIAGNOSIS — I4891 Unspecified atrial fibrillation: Secondary | ICD-10-CM | POA: Diagnosis not present

## 2021-09-04 DIAGNOSIS — S0990XA Unspecified injury of head, initial encounter: Secondary | ICD-10-CM | POA: Diagnosis not present

## 2021-09-04 DIAGNOSIS — E785 Hyperlipidemia, unspecified: Secondary | ICD-10-CM | POA: Diagnosis not present

## 2021-09-04 DIAGNOSIS — I509 Heart failure, unspecified: Secondary | ICD-10-CM | POA: Diagnosis not present

## 2021-09-04 DIAGNOSIS — F03A0 Unspecified dementia, mild, without behavioral disturbance, psychotic disturbance, mood disturbance, and anxiety: Secondary | ICD-10-CM | POA: Diagnosis not present

## 2021-09-04 DIAGNOSIS — S0083XA Contusion of other part of head, initial encounter: Secondary | ICD-10-CM | POA: Diagnosis not present

## 2021-09-04 DIAGNOSIS — I351 Nonrheumatic aortic (valve) insufficiency: Secondary | ICD-10-CM | POA: Diagnosis not present

## 2021-09-04 DIAGNOSIS — Z886 Allergy status to analgesic agent status: Secondary | ICD-10-CM | POA: Diagnosis not present

## 2021-09-04 DIAGNOSIS — Z961 Presence of intraocular lens: Secondary | ICD-10-CM | POA: Diagnosis not present

## 2021-09-04 DIAGNOSIS — X58XXXA Exposure to other specified factors, initial encounter: Secondary | ICD-10-CM | POA: Diagnosis not present

## 2021-09-04 DIAGNOSIS — Z9842 Cataract extraction status, left eye: Secondary | ICD-10-CM | POA: Diagnosis not present

## 2021-09-04 DIAGNOSIS — Z79899 Other long term (current) drug therapy: Secondary | ICD-10-CM | POA: Diagnosis not present

## 2021-09-06 ENCOUNTER — Other Ambulatory Visit: Payer: Self-pay

## 2021-09-24 DIAGNOSIS — J45909 Unspecified asthma, uncomplicated: Secondary | ICD-10-CM | POA: Diagnosis not present

## 2021-10-09 DIAGNOSIS — I4891 Unspecified atrial fibrillation: Secondary | ICD-10-CM | POA: Diagnosis not present

## 2021-10-09 DIAGNOSIS — C210 Malignant neoplasm of anus, unspecified: Secondary | ICD-10-CM | POA: Diagnosis not present

## 2021-10-09 DIAGNOSIS — I739 Peripheral vascular disease, unspecified: Secondary | ICD-10-CM | POA: Diagnosis not present

## 2021-10-09 DIAGNOSIS — E559 Vitamin D deficiency, unspecified: Secondary | ICD-10-CM | POA: Diagnosis not present

## 2021-10-09 DIAGNOSIS — D6869 Other thrombophilia: Secondary | ICD-10-CM | POA: Diagnosis not present

## 2021-10-09 DIAGNOSIS — M069 Rheumatoid arthritis, unspecified: Secondary | ICD-10-CM | POA: Diagnosis not present

## 2021-10-09 DIAGNOSIS — I700 Atherosclerosis of aorta: Secondary | ICD-10-CM | POA: Diagnosis not present

## 2021-10-09 DIAGNOSIS — E1169 Type 2 diabetes mellitus with other specified complication: Secondary | ICD-10-CM | POA: Diagnosis not present

## 2021-10-09 DIAGNOSIS — E7800 Pure hypercholesterolemia, unspecified: Secondary | ICD-10-CM | POA: Diagnosis not present

## 2021-10-09 DIAGNOSIS — Z23 Encounter for immunization: Secondary | ICD-10-CM | POA: Diagnosis not present

## 2021-10-09 DIAGNOSIS — I25119 Atherosclerotic heart disease of native coronary artery with unspecified angina pectoris: Secondary | ICD-10-CM | POA: Diagnosis not present

## 2021-10-13 ENCOUNTER — Other Ambulatory Visit: Payer: Self-pay | Admitting: Hematology

## 2021-10-13 MED ORDER — MEGESTROL ACETATE 400 MG/10ML PO SUSP: 400.0000 mg | Freq: Every day | ORAL | 1 refills | Status: DC

## 2021-10-14 DIAGNOSIS — R197 Diarrhea, unspecified: Secondary | ICD-10-CM | POA: Diagnosis not present

## 2021-10-14 DIAGNOSIS — A059 Bacterial foodborne intoxication, unspecified: Secondary | ICD-10-CM | POA: Diagnosis not present

## 2021-10-14 DIAGNOSIS — R112 Nausea with vomiting, unspecified: Secondary | ICD-10-CM | POA: Diagnosis not present

## 2021-10-19 DIAGNOSIS — I480 Paroxysmal atrial fibrillation: Secondary | ICD-10-CM | POA: Diagnosis not present

## 2021-10-19 DIAGNOSIS — I2510 Atherosclerotic heart disease of native coronary artery without angina pectoris: Secondary | ICD-10-CM | POA: Diagnosis not present

## 2021-10-23 DIAGNOSIS — R151 Fecal smearing: Secondary | ICD-10-CM | POA: Insufficient documentation

## 2021-10-23 DIAGNOSIS — K602 Anal fissure, unspecified: Secondary | ICD-10-CM | POA: Diagnosis not present

## 2021-10-23 DIAGNOSIS — C210 Malignant neoplasm of anus, unspecified: Secondary | ICD-10-CM | POA: Diagnosis not present

## 2021-10-24 DIAGNOSIS — J45909 Unspecified asthma, uncomplicated: Secondary | ICD-10-CM | POA: Diagnosis not present

## 2021-10-26 DIAGNOSIS — J40 Bronchitis, not specified as acute or chronic: Secondary | ICD-10-CM | POA: Diagnosis not present

## 2021-10-29 ENCOUNTER — Other Ambulatory Visit: Payer: Self-pay | Admitting: Family Medicine

## 2021-10-29 DIAGNOSIS — Z1231 Encounter for screening mammogram for malignant neoplasm of breast: Secondary | ICD-10-CM

## 2021-10-30 ENCOUNTER — Other Ambulatory Visit: Payer: Self-pay

## 2021-11-11 NOTE — Progress Notes (Signed)
Radiation Oncology         [phone] ________________________________  Name: Lorraine Gilbert MRN: [id number]  Date: 11/12/2021  DOB: [birth year]  Follow-Up Visit Note  CC: [name], MD  [name], MD  No diagnosis found.  Diagnosis: Newly diagnosed stage I (cT1, cN0, cM0) anal cancer   Prior history of: metastatic squamous cell carcinoma presenting in the left inguinal area (unknown primary site)   Interval Since Last Radiation: 6 months and 6 days   Intent: Curative  Radiation Treatment Dates: 03/30/2021 through 05/07/2021 Site Technique Total Dose (Gy) Dose per Fx (Gy) Completed Fx Beam Energies  Anus: Anus/pelvis IMRT 50.4/50.4 1.8 28/28 6X    Narrative:  The patient returns today for routine follow-up, she was last seen here for follow-up on 08/13/21. Since her last visit, she had a post-treatment restaging PET scan performed on 08/14/21 which showed a near complete treatment response demonstrated by a significant interval decrease in abnormal FDG avidity throughout the anal canal PET also showed no evidence of hypermetabolic metastatic disease.    The patient followed up with Dr. Burr Medico on 08/17/21. During which time, the patient reported some recurrent itching a burning with bowel movements and some blood with wiping. Labs collected on 07/31/21 were also reviewed with the patient which were overall improved since completing concurrent chemoradiation.      The patient continues to follow with Dr. Toy Cookey at the [facility]. Rectal exam performed by Dr. Toy Cookey on 10/23/21 showed NED.  Of note: On 09/04/21, the patient presented to the ED after she fell in her home and struck her head. She denied any LOC and was only appreciated to have a bruise on her head. CT of the head performed in triage was negative for any acute intracranial abnormalities/injury.           ***                    Allergies:  is allergic to ibuprofen.  Meds: Current Outpatient  Medications  Medication Sig Dispense Refill   ACCU-CHEK AVIVA PLUS test strip 1 each as directed.     Accu-Chek Softclix Lancets lancets 1 each by Other route as directed.     ACETAMINOPHEN 8 HOUR PO Take 1 tablet by mouth as needed.     albuterol (PROVENTIL) (2.5 MG/3ML) 0.083% nebulizer solution SMARTSIG:1 Vial(s) Via Nebulizer Every 4-6 Hours PRN     Alcohol Swabs (ALCOHOL WIPES) 70 % PADS DropSafe Alcohol Prep Pads     amiodarone (PACERONE) 100 MG tablet Take 100 mg by mouth daily.     Blood Glucose Calibration (ACCU-CHEK AVIVA) SOLN      Blood Glucose Monitoring Suppl (TRUE METRIX AIR GLUCOSE METER) w/Device KIT See admin instructions.     clopidogrel (PLAVIX) 75 MG tablet Take 75 mg by mouth daily.     diclofenac Sodium (VOLTAREN) 1 % GEL Apply topically.     ELIQUIS 2.5 MG TABS tablet Take 2.5 mg by mouth 2 (two) times daily.     escitalopram (LEXAPRO) 10 MG tablet Take 10 mg by mouth daily.     folic acid (FOLVITE) 1 MG tablet Take by mouth. (Patient not taking: Reported on 06/01/2021)     hydrocortisone (ANUSOL-HC) 25 MG suppository Place 1 suppository (25 mg total) rectally 2 (two) times daily. 12 suppository 0   levalbuterol (XOPENEX) 1.25 MG/3ML nebulizer solution Inhale into the lungs.     megestrol (MEGACE) 400 MG/10ML suspension Take 10 mLs (400  mg total) by mouth daily. 480 mL 1   mirtazapine (REMERON) 7.5 MG tablet Take 1 tablet (7.5 mg total) by mouth at bedtime. (Patient not taking: Reported on 06/01/2021) 30 tablet 0   nitroGLYCERIN (NITROSTAT) 0.4 MG SL tablet Place 0.4 mg under the tongue every 5 (five) minutes as needed for chest pain. (Patient not taking: Reported on 03/30/2021)     ondansetron (ZOFRAN) 4 MG tablet Take 1 tablet (4 mg total) by mouth every 8 (eight) hours as needed for nausea or vomiting. (Patient not taking: Reported on 03/30/2021) 20 tablet 1   polyethylene glycol powder (GLYCOLAX/MIRALAX) 17 GM/SCOOP powder Take by mouth.     prochlorperazine (COMPAZINE)  10 MG tablet Take 1 tablet (10 mg total) by mouth every 6 (six) hours as needed for nausea or vomiting. (Patient not taking: Reported on 03/30/2021) 30 tablet 1   rosuvastatin (CRESTOR) 5 MG tablet Take by mouth.     TRELEGY ELLIPTA 200-62.5-25 MCG/ACT AEPB Take 1 puff by mouth daily.     Vitamin D, Ergocalciferol, 50000 units CAPS 1 capsule     No current facility-administered medications for this encounter.    Physical Findings: The patient is in no acute distress. Patient is alert and oriented.  vitals were not taken for this visit. .  No significant changes. Lungs are clear to auscultation bilaterally. Heart has regular rate and rhythm. No palpable cervical, supraclavicular, or axillary adenopathy. Abdomen soft, non-tender, normal bowel sounds. ***   Lab Findings: Lab Results  Component Value Date   WBC 5.3 07/31/2021   HGB 12.2 07/31/2021   HCT 36.6 07/31/2021   MCV 95.1 07/31/2021   PLT 218 07/31/2021    Radiographic Findings: No results found.  Impression: Newly diagnosed stage I (cT1, cN0, cM0) anal cancer   Prior history of: metastatic squamous cell carcinoma presenting in the left inguinal area (unknown primary site)   The patient is recovering from the effects of radiation.  ***  Plan:  ***   *** minutes of total time was spent for this patient encounter, including preparation, face-to-face counseling with the patient and coordination of care, physical exam, and documentation of the encounter. ____________________________________  Blair Promise, PhD, MD  This document serves as a record of services personally performed by Gery Pray, MD. It was created on his behalf by Roney Mans, a trained medical scribe. The creation of this record is based on the scribe's personal observations and the provider's statements to them. This document has been checked and approved by the attending provider.

## 2021-11-12 ENCOUNTER — Encounter: Payer: Self-pay | Admitting: Radiation Oncology

## 2021-11-12 ENCOUNTER — Ambulatory Visit
Admission: RE | Admit: 2021-11-12 | Discharge: 2021-11-12 | Disposition: A | Discharge status: Home / Self Care | Payer: Medicare Other | Source: Ambulatory Visit | Attending: Radiation Oncology | Admitting: Radiation Oncology

## 2021-11-12 VITALS — BP 175/56 | HR 76 | Temp 97.7°F | Resp 24 | Ht 62.0 in | Wt 132.2 lb

## 2021-11-12 DIAGNOSIS — Z79899 Other long term (current) drug therapy: Secondary | ICD-10-CM | POA: Diagnosis not present

## 2021-11-12 DIAGNOSIS — C801 Malignant (primary) neoplasm, unspecified: Secondary | ICD-10-CM | POA: Diagnosis not present

## 2021-11-12 DIAGNOSIS — Z923 Personal history of irradiation: Secondary | ICD-10-CM | POA: Diagnosis not present

## 2021-11-12 DIAGNOSIS — C210 Malignant neoplasm of anus, unspecified: Secondary | ICD-10-CM | POA: Insufficient documentation

## 2021-11-12 DIAGNOSIS — C774 Secondary and unspecified malignant neoplasm of inguinal and lower limb lymph nodes: Secondary | ICD-10-CM | POA: Insufficient documentation

## 2021-11-12 DIAGNOSIS — Z7902 Long term (current) use of antithrombotics/antiplatelets: Secondary | ICD-10-CM | POA: Diagnosis not present

## 2021-11-12 DIAGNOSIS — Z7901 Long term (current) use of anticoagulants: Secondary | ICD-10-CM | POA: Insufficient documentation

## 2021-11-12 NOTE — Progress Notes (Signed)
Lorraine Gilbert is here today for follow up post radiation to the pelvic.  They completed their radiation on: 05/07/21   Does the patient complain of any of the following:  Pain:No Abdominal bloating: yes Diarrhea/Constipation: No Nausea/Vomiting: No Blood in Urine or Stool: No Urinary Issues (dysuria/incomplete emptying/ incontinence/ increased frequency/urgency): No Post radiation skin changes: No   Additional comments if applicable:    BP (!) 037/09 (BP Location: Right Arm, Patient Position: Sitting, Cuff Size: Normal)   Pulse 76   Temp 97.7 F (36.5 C)   Resp (!) 24   Ht '5\' 2"'$  (1.575 m)   Wt 132 lb 3.2 oz (60 kg)   SpO2 100%   BMI 24.18 kg/m

## 2021-11-16 ENCOUNTER — Inpatient Hospital Stay: Payer: Medicare Other

## 2021-11-16 ENCOUNTER — Inpatient Hospital Stay: Payer: Medicare Other | Attending: Hematology | Admitting: Hematology

## 2021-11-16 ENCOUNTER — Other Ambulatory Visit: Payer: Self-pay

## 2021-11-16 VITALS — BP 170/50 | HR 70 | Temp 97.2°F | Resp 19 | Ht 62.0 in | Wt 131.9 lb

## 2021-11-16 DIAGNOSIS — Z9221 Personal history of antineoplastic chemotherapy: Secondary | ICD-10-CM | POA: Insufficient documentation

## 2021-11-16 DIAGNOSIS — I10 Essential (primary) hypertension: Secondary | ICD-10-CM | POA: Insufficient documentation

## 2021-11-16 DIAGNOSIS — I4891 Unspecified atrial fibrillation: Secondary | ICD-10-CM | POA: Diagnosis not present

## 2021-11-16 DIAGNOSIS — M069 Rheumatoid arthritis, unspecified: Secondary | ICD-10-CM | POA: Diagnosis not present

## 2021-11-16 DIAGNOSIS — Z85048 Personal history of other malignant neoplasm of rectum, rectosigmoid junction, and anus: Secondary | ICD-10-CM | POA: Insufficient documentation

## 2021-11-16 DIAGNOSIS — M8580 Other specified disorders of bone density and structure, unspecified site: Secondary | ICD-10-CM | POA: Insufficient documentation

## 2021-11-16 DIAGNOSIS — C210 Malignant neoplasm of anus, unspecified: Secondary | ICD-10-CM

## 2021-11-16 DIAGNOSIS — Z7902 Long term (current) use of antithrombotics/antiplatelets: Secondary | ICD-10-CM | POA: Diagnosis not present

## 2021-11-16 DIAGNOSIS — Z923 Personal history of irradiation: Secondary | ICD-10-CM | POA: Insufficient documentation

## 2021-11-16 DIAGNOSIS — Z7901 Long term (current) use of anticoagulants: Secondary | ICD-10-CM | POA: Insufficient documentation

## 2021-11-16 DIAGNOSIS — J45909 Unspecified asthma, uncomplicated: Secondary | ICD-10-CM | POA: Diagnosis not present

## 2021-11-16 DIAGNOSIS — Z79899 Other long term (current) drug therapy: Secondary | ICD-10-CM | POA: Diagnosis not present

## 2021-11-16 DIAGNOSIS — E119 Type 2 diabetes mellitus without complications: Secondary | ICD-10-CM | POA: Diagnosis not present

## 2021-11-16 LAB — CMP (CANCER CENTER ONLY)
ALT: 14 U/L (ref 0–44)
AST: 16 U/L (ref 15–41)
Albumin: 3.9 g/dL (ref 3.5–5.0)
Alkaline Phosphatase: 46 U/L (ref 38–126)
Anion gap: 6 (ref 5–15)
BUN: 20 mg/dL (ref 8–23)
CO2: 23 mmol/L (ref 22–32)
Calcium: 9.3 mg/dL (ref 8.9–10.3)
Chloride: 111 mmol/L (ref 98–111)
Creatinine: 1.03 mg/dL — ABNORMAL HIGH (ref 0.44–1.00)
GFR, Estimated: 53 mL/min — ABNORMAL LOW (ref >60)
Glucose, Bld: 96 mg/dL (ref 70–99)
Potassium: 4.9 mmol/L (ref 3.5–5.1)
Sodium: 140 mmol/L (ref 135–145)
Total Bilirubin: 0.5 mg/dL (ref 0.3–1.2)
Total Protein: 7.3 g/dL (ref 6.5–8.1)

## 2021-11-16 LAB — CBC WITH DIFFERENTIAL (CANCER CENTER ONLY)
Abs Immature Granulocytes: 0.14 10*3/uL — ABNORMAL HIGH (ref 0.00–0.07)
Basophils Absolute: 0 10*3/uL (ref 0.0–0.1)
Basophils Relative: 0 %
Eosinophils Absolute: 0.1 10*3/uL (ref 0.0–0.5)
Eosinophils Relative: 2 %
HCT: 37.9 % (ref 36.0–46.0)
Hemoglobin: 12.6 g/dL (ref 12.0–15.0)
Immature Granulocytes: 3 %
Lymphocytes Relative: 25 %
Lymphs Abs: 1.4 10*3/uL (ref 0.7–4.0)
MCH: 31.9 pg (ref 26.0–34.0)
MCHC: 33.2 g/dL (ref 30.0–36.0)
MCV: 95.9 fL (ref 80.0–100.0)
Monocytes Absolute: 0.7 10*3/uL (ref 0.1–1.0)
Monocytes Relative: 13 %
Neutro Abs: 3.1 10*3/uL (ref 1.7–7.7)
Neutrophils Relative %: 57 %
Platelet Count: 239 10*3/uL (ref 150–400)
RBC: 3.95 MIL/uL (ref 3.87–5.11)
RDW: 16.3 % — ABNORMAL HIGH (ref 11.5–15.5)
WBC Count: 5.4 10*3/uL (ref 4.0–10.5)
nRBC: 0 % (ref 0.0–0.2)

## 2021-11-16 NOTE — Progress Notes (Signed)
[place]   [phone] Fax:(336) [phone]   Clinic Follow up Note   Patient Care Team: [name], MD as PCP - General (Family Medicine) Cameron Sprang, MD as Consulting Physician (Neurology) Truitt Merle, MD as Consulting Physician (Oncology) Royston Bake, RN as Oncology Nurse Navigator (Oncology)  Date of Service:  11/16/2021  CHIEF COMPLAINT: f/u of anal cancer  CURRENT THERAPY:  Surveillance  ASSESSMENT & PLAN:  Stepahnie Gilbert is a 88 y.o. female with   1. Anal Squamous Cell Carcinoma, cT1N0M0 -initially presented with palpable left groin lymph node in 01/2018. Biopsy on 02/27/18 showed metastatic squamous cell carcinoma, unknown primary. PET scan 03/23/18 also didn't show a primary. Colonoscopy and GYN work up were negative for malignancy  -she was treated with radiation therapy under Dr. Sondra Come 6/17-6/30/20. -she presented back with constipation in 09/2020. Rectal exam showed a questionable abnormality. Sigmoidoscopy on 01/30/21 with Dr. Percell Miller showed a small tuft of tissue above the anal verge. Pathology showed high-grade squamous intraepithelial lesion/anal intraepithelial neoplasia. -she was referred to Dr. Hilliard Clark and underwent anal biopsy in the OR on 03/05/21. Per OR note, ulcerated lesion in left posterolateral anal canal extending from anal verge to roughly 1.5cm proximal. Pathology from rectal mass confirmed invasive squamous cell carcinoma, moderately differentiated, MMR normal. -PET scan 03/13/21 was negative for metastasis -She received concurrent chemoradiation with 5-FU and mitomycin from 03/30/21 to 05/07/21. She overall tolerated chemotherapy well.  -most recent visit with Dr. Hilliard Clark on 06/25/21 showed no evidence of recurrence on DRE. She is scheduled for anoscope on 08/28/21. -post-treatment PET on 08/14/21 showed treatment response, no evidence of metastatic disease.  -She is clinically doing well, physical exam including rectal exam was  unremarkable.  No clinical concern for recurrence    Plan: -f/u in 4 months with lab and CT CAP a few days before    No problem-specific Assessment & Plan notes found for this encounter.   SUMMARY OF ONCOLOGIC HISTORY: Oncology History  Metastatic squamous cell carcinoma involving lymph node with unknown primary site [facility])  02/06/2018 Imaging   Impression CT abdomen and pelvis 1.  Enlarged left groin lymph node which is of uncertain etiology. This would be amenable to percutaneous sampling if deemed clinically appropriate. 2.  Hepatic steatosis. Focal area of low-attenuation in the far lateral left hepatic lobe, not clearly seen on the prior exam. Consider liver ultrasound for further evaluation.  3.  Mild L1 compression deformity which is new.   02/28/2018 Pathology Results   Left groin lymph node, needle core biopsy:       -Metastatic squamous cell carcinoma, see comment  P16 stain is strongly positive, suggestive of cervical or anogenital origin in this location. Immunohistochemical stains for CK5, p63, and pan cytokeratin are positive. ER is weakly positive. CK7 and CK20 are negative.   03/15/2018 Pathology Results   1. Cervix, biopsy, 12 o'clock - BENIGN SQUAMOUS MUCOSA WITH INFLAMMATION. - NO DYSPLASIA OR MALIGNANCY. 2. Endocervix, curettage - BENIGN SQUAMOUS EPITHELIUM.   03/23/2018 PET scan   1. Isolated intensely hypermetabolic enlarged LEFT inguinal lymph node. No clear primary identified. 2. No abnormal activity associated with the vagina or anorectal tissue. 3. No hypermetabolic lymph nodes in the pelvis. 4. Two adjacent nodules in the RIGHT upper lobe with very low metabolic activity. These are not favored primary malignancies. Recommend follow-up per Fleischner criteria. Non-contrast chest CT at 6-12 months is recommended.    07/05/2018 - 07/18/2018 Radiation Therapy   Left inguinal area/nodes  treated to 30 Gy in 10 fractions   09/22/2020 Imaging   EXAM: CT ABDOMEN  PELVIS W IV CONTRAST   IMPRESSION:  1. Multicystic enhancing right adnexal lesion with mild surrounding stranding. Given short interval since recent CT 3 weeks prior, this is more suspicious for acute findings such as torsion or infection, less likely neoplasm. Recommend endovaginal ultrasound for further evaluation and follow-up to resolution.  2. Other stable chronic findings.    01/30/2021 Pathology Results   DIAGNOSIS   SMALL  TUFT OF TISSUE ABOVE ANAL VERGE, ENDOSCOPIC BIOPSIES:   - HIGH-GRADE SQUAMOUS INTRAEPITHELIAL LESION/ANAL INTRAEPITHELIAL NEOPLASIA 2-3 OUT OF 3 (HGSIL/AIN 2-3).   - SEE COMMENT.  (MPL002)   COMMENT  MORPHOLOGICALLY THERE IS AT LEAST TWO THIRDS THICKNESS INVOLVEMENT BY DYSPLASTIC EPITHELIUM WITH MID-LEVEL MITOSES SECONDARY HOWEVER DUE TO POOR ORIENTATION AND MORPHOLOGIC ASSESSMENT IS 2 SERVICE MUCOSA IS ONLY PRESENT IN FOCAL AREAS MORPHOLOGICALLY THIS REPRESENTS AT LEAST AIN 2 AND MOST LIKELY AIN 3.  IN EITHER RESPECT THE FINDINGS ARE CONSISTENT WITH HIGH-GRADE SQUAMOUS INTRAEPITHELIAL LESION WHICH IS ALSO CONFIRMED BY STRONGLY BLOCK LIKE P16 IMMUNOHISTOCHEMICAL  POSITIVITY.    03/05/2021 Pathology Results   Final Diagnosis    Rectal mass, biopsy: -Invasive squamous cell carcinoma, moderately differentiated   Addendum 1    The carcinoma was analyzed by [facility] for DNA mismatch repair proteins.  The neoplasm retained nuclear expression of all four mismatch repair proteins, MLH1, PMS2, MSH2, MSH6.  Controls worked appropriately.     Anal cancer ([place])  01/30/2021 Cancer Staging   Staging form: Anus, AJCC V9 - Clinical stage from 01/30/2021: Stage I (cT1, cN0, cM0) - Signed by Truitt Merle, MD on 03/11/2021 Stage prefix: Initial diagnosis Histologic grade (G): G2 Histologic grading system: 4 grade system   03/11/2021 Initial Diagnosis   Anal cancer ([place])   03/30/2021 -  Chemotherapy   Patient is on Treatment Plan : ANUS Mitomycin D1,28 / 5FU D1-4, 28-31 q32d         INTERVAL HISTORY:  Lorraine Gilbert is here for a follow up of anal cancer. She was last seen by me on 08/18/21. She presents to the clinic alone. She reports she is doing well overall. She denies any new concerns or side effects.   All other systems were reviewed with the patient and are negative.  MEDICAL HISTORY:  Past Medical History:  Diagnosis Date   A-fib ([place])    Abnormal heart rhythm    Allergic rhinitis    Anxiety    Asthma    Diabetes mellitus without complication (HCC)    Type II   GERD (gastroesophageal reflux disease)    Headache    History of radiation therapy 07/05/2018-07/18/2018   left pelvis        Dr Gery Pray   History of radiation therapy    Anus- 03/30/21-05/07/21- Dr. Gery Pray   Hypertension    Neuromuscular disorder [facility])    Siactic pain in right leg   Osteopenia    Rheumatoid arthritis ([place])    Rotator cuff tear arthropathy    Right   Vertigo     SURGICAL HISTORY: Past Surgical History:  Procedure Laterality Date   BIOPSY  12/13/2018   Procedure: BIOPSY;  Surgeon: Wonda Horner, MD;  Location: [facility];  Service: Endoscopy;;   CATARACT EXTRACTION     bilateral   COLONOSCOPY WITH PROPOFOL N/A 12/13/2018   Procedure: COLONOSCOPY WITH PROPOFOL;  Surgeon: Wonda Horner, MD;  Location: [facility];  Service:  Endoscopy;  Laterality: N/A;   ESOPHAGOGASTRODUODENOSCOPY N/A 12/13/2018   Procedure: ESOPHAGOGASTRODUODENOSCOPY (EGD);  Surgeon: Wonda Horner, MD;  Location: [facility];  Service: Endoscopy;  Laterality: N/A;   EYE SURGERY     POLYPECTOMY  12/13/2018   Procedure: POLYPECTOMY;  Surgeon: Wonda Horner, MD;  Location: [facility];  Service: Endoscopy;;   REVERSE SHOULDER ARTHROPLASTY Right 08/04/2017   REVERSE SHOULDER ARTHROPLASTY Right 08/04/2017   Procedure: RIGHT REVERSE SHOULDER ARTHROPLASTY;  Surgeon: Justice Britain, MD;  Location: [place];  Service: Orthopedics;  Laterality: Right;   TUBAL LIGATION      I have  reviewed the social history and family history with the patient and they are unchanged from previous note.  ALLERGIES:  is allergic to ibuprofen.  MEDICATIONS:  Current Outpatient Medications  Medication Sig Dispense Refill   ACCU-CHEK AVIVA PLUS test strip 1 each as directed.     Accu-Chek Softclix Lancets lancets 1 each by Other route as directed.     ACETAMINOPHEN 8 HOUR PO Take 1 tablet by mouth as needed.     albuterol (PROVENTIL) (2.5 MG/3ML) 0.083% nebulizer solution SMARTSIG:1 Vial(s) Via Nebulizer Every 4-6 Hours PRN     Alcohol Swabs (ALCOHOL WIPES) 70 % PADS DropSafe Alcohol Prep Pads     amiodarone (PACERONE) 100 MG tablet Take 100 mg by mouth daily.     Blood Glucose Calibration (ACCU-CHEK AVIVA) SOLN      Blood Glucose Monitoring Suppl (TRUE METRIX AIR GLUCOSE METER) w/Device KIT See admin instructions.     clopidogrel (PLAVIX) 75 MG tablet Take 75 mg by mouth daily.     diclofenac Sodium (VOLTAREN) 1 % GEL Apply topically.     ELIQUIS 2.5 MG TABS tablet Take 2.5 mg by mouth 2 (two) times daily.     escitalopram (LEXAPRO) 10 MG tablet Take 10 mg by mouth daily.     folic acid (FOLVITE) 1 MG tablet Take by mouth. (Patient not taking: Reported on 06/01/2021)     hydrocortisone (ANUSOL-HC) 25 MG suppository Place 1 suppository (25 mg total) rectally 2 (two) times daily. (Patient not taking: Reported on 11/12/2021) 12 suppository 0   levalbuterol (XOPENEX) 1.25 MG/3ML nebulizer solution Inhale into the lungs.     megestrol (MEGACE) 400 MG/10ML suspension Take 10 mLs (400 mg total) by mouth daily. 480 mL 1   mirtazapine (REMERON) 7.5 MG tablet Take 1 tablet (7.5 mg total) by mouth at bedtime. 30 tablet 0   nitroGLYCERIN (NITROSTAT) 0.4 MG SL tablet Place 0.4 mg under the tongue every 5 (five) minutes as needed for chest pain. (Patient not taking: Reported on 03/30/2021)     ondansetron (ZOFRAN) 4 MG tablet Take 1 tablet (4 mg total) by mouth every 8 (eight) hours as needed for nausea  or vomiting. (Patient not taking: Reported on 03/30/2021) 20 tablet 1   polyethylene glycol powder (GLYCOLAX/MIRALAX) 17 GM/SCOOP powder Take by mouth.     prochlorperazine (COMPAZINE) 10 MG tablet Take 1 tablet (10 mg total) by mouth every 6 (six) hours as needed for nausea or vomiting. (Patient not taking: Reported on 03/30/2021) 30 tablet 1   rosuvastatin (CRESTOR) 5 MG tablet Take by mouth.     TRELEGY ELLIPTA 200-62.5-25 MCG/ACT AEPB Take 1 puff by mouth daily.     Vitamin D, Ergocalciferol, 50000 units CAPS 1 capsule     No current facility-administered medications for this visit.    PHYSICAL EXAMINATION: ECOG PERFORMANCE STATUS: 0 - Asymptomatic  Vitals:   11/16/21 1430  BP: (!) 170/50  Pulse: 70  Resp: 19  Temp: (!) 97.2 F (36.2 C)  SpO2: 100%   Wt Readings from Last 3 Encounters:  11/16/21 131 lb 14.4 oz (59.8 kg)  11/12/21 132 lb 3.2 oz (60 kg)  08/17/21 131 lb 12.8 oz (59.8 kg)     GENERAL:alert, no distress and comfortable SKIN: skin color, texture, turgor are normal, no rashes or significant lesions EYES: normal, Conjunctiva are pink and non-injected, sclera clear  NECK: supple, thyroid normal size, non-tender, without nodularity LYMPH:  no palpable lymphadenopathy in the cervical, axillary or inguinal LUNGS: clear to auscultation and percussion with normal breathing effort HEART: regular rate & rhythm and no murmurs and no lower extremity edema ABDOMEN:abdomen soft, non-tender and normal bowel sounds Musculoskeletal:no cyanosis of digits and no clubbing  NEURO: alert & oriented x 3 with fluent speech, no focal motor/sensory deficits RECTAL: No palpable mass. No blood on glove. Normal stool present. Benign exam    LABORATORY DATA:  I have reviewed the data as listed    Latest Ref Rng & Units 11/16/2021    1:19 PM 07/31/2021   10:02 AM 06/01/2021    3:50 PM  CBC  WBC 4.0 - 10.5 K/uL 5.4  5.3  4.4   Hemoglobin 12.0 - 15.0 g/dL 12.6  12.2  11.5   Hematocrit  36.0 - 46.0 % 37.9  36.6  34.9   Platelets 150 - 400 K/uL 239  218  202         Latest Ref Rng & Units 11/16/2021    1:19 PM 07/31/2021   10:02 AM 05/19/2021    9:48 AM  CMP  Glucose 70 - 99 mg/dL 96  115  142   BUN 8 - 23 mg/dL _0 Creatinine 0.44 - 1.00 mg/dL 1.03  0.91  0.91   Sodium 135 - 145 mmol/L 140  141  141   Potassium 3.5 - 5.1 mmol/L 4.9  4.7  4.8   Chloride 98 - 111 mmol/L 111  109  108   CO2 22 - 32 mmol/L _1 Calcium 8.9 - 10.3 mg/dL 9.3  9.3  8.6   Total Protein 6.5 - 8.1 g/dL 7.3  7.3  6.5   Total Bilirubin 0.3 - 1.2 mg/dL 0.5  0.5  0.4   Alkaline Phos 38 - 126 U/L 46  67  56   AST 15 - 41 U/L _2 ALT 0 - 44 U/L _3 RADIOGRAPHIC STUDIES: I have personally reviewed the radiological images as listed and agreed with the findings in the report. No results found.    Orders Placed This Encounter  Procedures   CT CHEST ABDOMEN PELVIS W CONTRAST    Standing Status:   Future    Standing Expiration Date:   11/17/2022    Order Specific Question:   Preferred imaging location?    Answer:   [facility]    Order Specific Question:   Release to patient    Answer:   Immediate    Order Specific Question:   Is Oral Contrast requested for this exam?    Answer:   Yes, Per Radiology protocol   All questions were answered. The patient knows to call the clinic with any problems, questions or concerns. No barriers to learning was detected. The total time spent in the appointment  was 25 minutes.     Truitt Merle, MD 11/16/2021   I, Wilburn Mylar, am acting as scribe for Truitt Merle, MD.   I have reviewed the above documentation for accuracy and completeness, and I agree with the above.

## 2021-11-18 ENCOUNTER — Other Ambulatory Visit: Payer: Self-pay

## 2021-11-23 DIAGNOSIS — Z885 Allergy status to narcotic agent status: Secondary | ICD-10-CM | POA: Diagnosis not present

## 2021-11-23 DIAGNOSIS — M159 Polyosteoarthritis, unspecified: Secondary | ICD-10-CM | POA: Diagnosis not present

## 2021-11-23 DIAGNOSIS — Z79899 Other long term (current) drug therapy: Secondary | ICD-10-CM | POA: Diagnosis not present

## 2021-11-23 DIAGNOSIS — R768 Other specified abnormal immunological findings in serum: Secondary | ICD-10-CM | POA: Diagnosis not present

## 2021-11-24 DIAGNOSIS — J45909 Unspecified asthma, uncomplicated: Secondary | ICD-10-CM | POA: Diagnosis not present

## 2021-12-17 ENCOUNTER — Ambulatory Visit: Payer: Medicare Other

## 2021-12-21 DIAGNOSIS — M25562 Pain in left knee: Secondary | ICD-10-CM | POA: Diagnosis not present

## 2021-12-21 DIAGNOSIS — M25512 Pain in left shoulder: Secondary | ICD-10-CM | POA: Diagnosis not present

## 2021-12-21 DIAGNOSIS — M25561 Pain in right knee: Secondary | ICD-10-CM | POA: Diagnosis not present

## 2021-12-21 DIAGNOSIS — M25511 Pain in right shoulder: Secondary | ICD-10-CM | POA: Diagnosis not present

## 2021-12-24 DIAGNOSIS — J45909 Unspecified asthma, uncomplicated: Secondary | ICD-10-CM | POA: Diagnosis not present

## 2022-01-04 DIAGNOSIS — C210 Malignant neoplasm of anus, unspecified: Secondary | ICD-10-CM | POA: Diagnosis not present

## 2022-01-07 DIAGNOSIS — M2550 Pain in unspecified joint: Secondary | ICD-10-CM | POA: Diagnosis not present

## 2022-01-07 DIAGNOSIS — M5459 Other low back pain: Secondary | ICD-10-CM | POA: Diagnosis not present

## 2022-01-07 DIAGNOSIS — M4316 Spondylolisthesis, lumbar region: Secondary | ICD-10-CM | POA: Diagnosis not present

## 2022-01-07 DIAGNOSIS — M4727 Other spondylosis with radiculopathy, lumbosacral region: Secondary | ICD-10-CM | POA: Diagnosis not present

## 2022-01-18 DIAGNOSIS — E119 Type 2 diabetes mellitus without complications: Secondary | ICD-10-CM | POA: Diagnosis not present

## 2022-01-18 DIAGNOSIS — Z885 Allergy status to narcotic agent status: Secondary | ICD-10-CM | POA: Diagnosis not present

## 2022-01-18 DIAGNOSIS — I4891 Unspecified atrial fibrillation: Secondary | ICD-10-CM | POA: Diagnosis not present

## 2022-01-18 DIAGNOSIS — N281 Cyst of kidney, acquired: Secondary | ICD-10-CM | POA: Diagnosis not present

## 2022-01-18 DIAGNOSIS — R197 Diarrhea, unspecified: Secondary | ICD-10-CM | POA: Diagnosis not present

## 2022-01-18 DIAGNOSIS — I110 Hypertensive heart disease with heart failure: Secondary | ICD-10-CM | POA: Diagnosis not present

## 2022-01-18 DIAGNOSIS — Z955 Presence of coronary angioplasty implant and graft: Secondary | ICD-10-CM | POA: Diagnosis not present

## 2022-01-18 DIAGNOSIS — Z886 Allergy status to analgesic agent status: Secondary | ICD-10-CM | POA: Diagnosis not present

## 2022-01-18 DIAGNOSIS — E785 Hyperlipidemia, unspecified: Secondary | ICD-10-CM | POA: Diagnosis not present

## 2022-01-18 DIAGNOSIS — K6389 Other specified diseases of intestine: Secondary | ICD-10-CM | POA: Diagnosis not present

## 2022-01-18 DIAGNOSIS — I5032 Chronic diastolic (congestive) heart failure: Secondary | ICD-10-CM | POA: Diagnosis not present

## 2022-01-18 DIAGNOSIS — Z79899 Other long term (current) drug therapy: Secondary | ICD-10-CM | POA: Diagnosis not present

## 2022-01-18 DIAGNOSIS — R140 Abdominal distension (gaseous): Secondary | ICD-10-CM | POA: Diagnosis not present

## 2022-01-18 DIAGNOSIS — Z7902 Long term (current) use of antithrombotics/antiplatelets: Secondary | ICD-10-CM | POA: Diagnosis not present

## 2022-01-18 DIAGNOSIS — K449 Diaphragmatic hernia without obstruction or gangrene: Secondary | ICD-10-CM | POA: Diagnosis not present

## 2022-01-18 DIAGNOSIS — Z7901 Long term (current) use of anticoagulants: Secondary | ICD-10-CM | POA: Diagnosis not present

## 2022-01-22 DIAGNOSIS — E039 Hypothyroidism, unspecified: Secondary | ICD-10-CM | POA: Diagnosis not present

## 2022-01-22 DIAGNOSIS — D6869 Other thrombophilia: Secondary | ICD-10-CM | POA: Diagnosis not present

## 2022-01-22 DIAGNOSIS — I739 Peripheral vascular disease, unspecified: Secondary | ICD-10-CM | POA: Diagnosis not present

## 2022-01-22 DIAGNOSIS — E1169 Type 2 diabetes mellitus with other specified complication: Secondary | ICD-10-CM | POA: Diagnosis not present

## 2022-01-22 DIAGNOSIS — I4891 Unspecified atrial fibrillation: Secondary | ICD-10-CM | POA: Diagnosis not present

## 2022-01-22 DIAGNOSIS — E7800 Pure hypercholesterolemia, unspecified: Secondary | ICD-10-CM | POA: Diagnosis not present

## 2022-01-22 DIAGNOSIS — C779 Secondary and unspecified malignant neoplasm of lymph node, unspecified: Secondary | ICD-10-CM | POA: Diagnosis not present

## 2022-01-22 DIAGNOSIS — C210 Malignant neoplasm of anus, unspecified: Secondary | ICD-10-CM | POA: Diagnosis not present

## 2022-01-22 DIAGNOSIS — C801 Malignant (primary) neoplasm, unspecified: Secondary | ICD-10-CM | POA: Diagnosis not present

## 2022-01-22 DIAGNOSIS — I700 Atherosclerosis of aorta: Secondary | ICD-10-CM | POA: Diagnosis not present

## 2022-01-22 DIAGNOSIS — I25119 Atherosclerotic heart disease of native coronary artery with unspecified angina pectoris: Secondary | ICD-10-CM | POA: Diagnosis not present

## 2022-01-22 DIAGNOSIS — R196 Halitosis: Secondary | ICD-10-CM | POA: Diagnosis not present

## 2022-02-11 ENCOUNTER — Ambulatory Visit
Admission: RE | Admit: 2022-02-11 | Discharge: 2022-02-11 | Disposition: A | Discharge status: Home / Self Care | Payer: Medicare Other | Source: Ambulatory Visit | Attending: Family Medicine | Admitting: Family Medicine

## 2022-02-11 DIAGNOSIS — Z1231 Encounter for screening mammogram for malignant neoplasm of breast: Secondary | ICD-10-CM | POA: Diagnosis not present

## 2022-02-13 DIAGNOSIS — I498 Other specified cardiac arrhythmias: Secondary | ICD-10-CM | POA: Diagnosis not present

## 2022-02-13 DIAGNOSIS — Z8673 Personal history of transient ischemic attack (TIA), and cerebral infarction without residual deficits: Secondary | ICD-10-CM | POA: Diagnosis not present

## 2022-02-13 DIAGNOSIS — M069 Rheumatoid arthritis, unspecified: Secondary | ICD-10-CM | POA: Diagnosis not present

## 2022-02-13 DIAGNOSIS — F32A Depression, unspecified: Secondary | ICD-10-CM | POA: Diagnosis not present

## 2022-02-13 DIAGNOSIS — E785 Hyperlipidemia, unspecified: Secondary | ICD-10-CM | POA: Diagnosis not present

## 2022-02-13 DIAGNOSIS — Z743 Need for continuous supervision: Secondary | ICD-10-CM | POA: Diagnosis not present

## 2022-02-13 DIAGNOSIS — J90 Pleural effusion, not elsewhere classified: Secondary | ICD-10-CM | POA: Diagnosis not present

## 2022-02-13 DIAGNOSIS — I351 Nonrheumatic aortic (valve) insufficiency: Secondary | ICD-10-CM | POA: Diagnosis not present

## 2022-02-13 DIAGNOSIS — R9401 Abnormal electroencephalogram [EEG]: Secondary | ICD-10-CM | POA: Diagnosis not present

## 2022-02-13 DIAGNOSIS — Z79899 Other long term (current) drug therapy: Secondary | ICD-10-CM | POA: Diagnosis not present

## 2022-02-13 DIAGNOSIS — Z7902 Long term (current) use of antithrombotics/antiplatelets: Secondary | ICD-10-CM | POA: Diagnosis not present

## 2022-02-13 DIAGNOSIS — J45909 Unspecified asthma, uncomplicated: Secondary | ICD-10-CM | POA: Diagnosis not present

## 2022-02-13 DIAGNOSIS — R4781 Slurred speech: Secondary | ICD-10-CM | POA: Diagnosis not present

## 2022-02-13 DIAGNOSIS — I672 Cerebral atherosclerosis: Secondary | ICD-10-CM | POA: Diagnosis not present

## 2022-02-13 DIAGNOSIS — H4010X0 Unspecified open-angle glaucoma, stage unspecified: Secondary | ICD-10-CM | POA: Diagnosis not present

## 2022-02-13 DIAGNOSIS — I10 Essential (primary) hypertension: Secondary | ICD-10-CM | POA: Diagnosis not present

## 2022-02-13 DIAGNOSIS — I639 Cerebral infarction, unspecified: Secondary | ICD-10-CM | POA: Diagnosis not present

## 2022-02-13 DIAGNOSIS — N390 Urinary tract infection, site not specified: Secondary | ICD-10-CM | POA: Diagnosis not present

## 2022-02-13 DIAGNOSIS — R000 Tachycardia, unspecified: Secondary | ICD-10-CM | POA: Diagnosis not present

## 2022-02-13 DIAGNOSIS — Z7982 Long term (current) use of aspirin: Secondary | ICD-10-CM | POA: Diagnosis not present

## 2022-02-13 DIAGNOSIS — R404 Transient alteration of awareness: Secondary | ICD-10-CM | POA: Diagnosis not present

## 2022-02-13 DIAGNOSIS — Z781 Physical restraint status: Secondary | ICD-10-CM | POA: Diagnosis not present

## 2022-02-13 DIAGNOSIS — B9689 Other specified bacterial agents as the cause of diseases classified elsewhere: Secondary | ICD-10-CM | POA: Diagnosis not present

## 2022-02-13 DIAGNOSIS — E039 Hypothyroidism, unspecified: Secondary | ICD-10-CM | POA: Diagnosis not present

## 2022-02-13 DIAGNOSIS — E119 Type 2 diabetes mellitus without complications: Secondary | ICD-10-CM | POA: Diagnosis not present

## 2022-02-13 DIAGNOSIS — I491 Atrial premature depolarization: Secondary | ICD-10-CM | POA: Diagnosis not present

## 2022-02-13 DIAGNOSIS — R569 Unspecified convulsions: Secondary | ICD-10-CM | POA: Diagnosis not present

## 2022-02-13 DIAGNOSIS — Z85048 Personal history of other malignant neoplasm of rectum, rectosigmoid junction, and anus: Secondary | ICD-10-CM | POA: Diagnosis not present

## 2022-02-13 DIAGNOSIS — G9340 Encephalopathy, unspecified: Secondary | ICD-10-CM | POA: Diagnosis not present

## 2022-02-13 DIAGNOSIS — I4891 Unspecified atrial fibrillation: Secondary | ICD-10-CM | POA: Diagnosis not present

## 2022-02-14 DIAGNOSIS — E039 Hypothyroidism, unspecified: Secondary | ICD-10-CM | POA: Diagnosis not present

## 2022-02-14 DIAGNOSIS — F32A Depression, unspecified: Secondary | ICD-10-CM | POA: Diagnosis not present

## 2022-02-14 DIAGNOSIS — E785 Hyperlipidemia, unspecified: Secondary | ICD-10-CM | POA: Diagnosis not present

## 2022-02-14 DIAGNOSIS — R569 Unspecified convulsions: Secondary | ICD-10-CM | POA: Diagnosis not present

## 2022-02-14 DIAGNOSIS — E119 Type 2 diabetes mellitus without complications: Secondary | ICD-10-CM | POA: Diagnosis not present

## 2022-02-14 DIAGNOSIS — I4891 Unspecified atrial fibrillation: Secondary | ICD-10-CM | POA: Diagnosis not present

## 2022-02-14 DIAGNOSIS — I672 Cerebral atherosclerosis: Secondary | ICD-10-CM | POA: Diagnosis not present

## 2022-02-15 DIAGNOSIS — R9401 Abnormal electroencephalogram [EEG]: Secondary | ICD-10-CM | POA: Diagnosis not present

## 2022-02-15 DIAGNOSIS — E785 Hyperlipidemia, unspecified: Secondary | ICD-10-CM | POA: Diagnosis not present

## 2022-02-15 DIAGNOSIS — G9340 Encephalopathy, unspecified: Secondary | ICD-10-CM | POA: Diagnosis not present

## 2022-02-15 DIAGNOSIS — I351 Nonrheumatic aortic (valve) insufficiency: Secondary | ICD-10-CM | POA: Diagnosis not present

## 2022-02-15 DIAGNOSIS — F32A Depression, unspecified: Secondary | ICD-10-CM | POA: Diagnosis not present

## 2022-02-15 DIAGNOSIS — E039 Hypothyroidism, unspecified: Secondary | ICD-10-CM | POA: Diagnosis not present

## 2022-02-15 DIAGNOSIS — I4891 Unspecified atrial fibrillation: Secondary | ICD-10-CM | POA: Diagnosis not present

## 2022-02-15 DIAGNOSIS — E119 Type 2 diabetes mellitus without complications: Secondary | ICD-10-CM | POA: Diagnosis not present

## 2022-02-15 DIAGNOSIS — R569 Unspecified convulsions: Secondary | ICD-10-CM | POA: Diagnosis not present

## 2022-02-15 DIAGNOSIS — I672 Cerebral atherosclerosis: Secondary | ICD-10-CM | POA: Diagnosis not present

## 2022-02-16 DIAGNOSIS — E785 Hyperlipidemia, unspecified: Secondary | ICD-10-CM | POA: Diagnosis not present

## 2022-02-16 DIAGNOSIS — E119 Type 2 diabetes mellitus without complications: Secondary | ICD-10-CM | POA: Diagnosis not present

## 2022-02-16 DIAGNOSIS — E039 Hypothyroidism, unspecified: Secondary | ICD-10-CM | POA: Diagnosis not present

## 2022-02-16 DIAGNOSIS — R569 Unspecified convulsions: Secondary | ICD-10-CM | POA: Diagnosis not present

## 2022-02-16 DIAGNOSIS — N390 Urinary tract infection, site not specified: Secondary | ICD-10-CM | POA: Diagnosis not present

## 2022-02-16 DIAGNOSIS — I4891 Unspecified atrial fibrillation: Secondary | ICD-10-CM | POA: Diagnosis not present

## 2022-02-16 DIAGNOSIS — R9401 Abnormal electroencephalogram [EEG]: Secondary | ICD-10-CM | POA: Diagnosis not present

## 2022-02-16 DIAGNOSIS — F32A Depression, unspecified: Secondary | ICD-10-CM | POA: Diagnosis not present

## 2022-02-16 DIAGNOSIS — I672 Cerebral atherosclerosis: Secondary | ICD-10-CM | POA: Diagnosis not present

## 2022-02-16 DIAGNOSIS — G9340 Encephalopathy, unspecified: Secondary | ICD-10-CM | POA: Diagnosis not present

## 2022-02-17 ENCOUNTER — Other Ambulatory Visit (HOSPITAL_COMMUNITY): Payer: Medicare Other

## 2022-02-23 DIAGNOSIS — I872 Venous insufficiency (chronic) (peripheral): Secondary | ICD-10-CM | POA: Diagnosis not present

## 2022-02-23 DIAGNOSIS — K219 Gastro-esophageal reflux disease without esophagitis: Secondary | ICD-10-CM | POA: Diagnosis not present

## 2022-02-23 DIAGNOSIS — J45909 Unspecified asthma, uncomplicated: Secondary | ICD-10-CM | POA: Diagnosis not present

## 2022-02-23 DIAGNOSIS — Z7902 Long term (current) use of antithrombotics/antiplatelets: Secondary | ICD-10-CM | POA: Diagnosis not present

## 2022-02-23 DIAGNOSIS — Z85048 Personal history of other malignant neoplasm of rectum, rectosigmoid junction, and anus: Secondary | ICD-10-CM | POA: Diagnosis not present

## 2022-02-23 DIAGNOSIS — Z8673 Personal history of transient ischemic attack (TIA), and cerebral infarction without residual deficits: Secondary | ICD-10-CM | POA: Diagnosis not present

## 2022-02-23 DIAGNOSIS — I70213 Atherosclerosis of native arteries of extremities with intermittent claudication, bilateral legs: Secondary | ICD-10-CM | POA: Diagnosis not present

## 2022-02-23 DIAGNOSIS — I4891 Unspecified atrial fibrillation: Secondary | ICD-10-CM | POA: Diagnosis not present

## 2022-02-23 DIAGNOSIS — M069 Rheumatoid arthritis, unspecified: Secondary | ICD-10-CM | POA: Diagnosis not present

## 2022-02-23 DIAGNOSIS — H4010X0 Unspecified open-angle glaucoma, stage unspecified: Secondary | ICD-10-CM | POA: Diagnosis not present

## 2022-02-23 DIAGNOSIS — E785 Hyperlipidemia, unspecified: Secondary | ICD-10-CM | POA: Diagnosis not present

## 2022-02-23 DIAGNOSIS — N390 Urinary tract infection, site not specified: Secondary | ICD-10-CM | POA: Diagnosis not present

## 2022-02-23 DIAGNOSIS — F32A Depression, unspecified: Secondary | ICD-10-CM | POA: Diagnosis not present

## 2022-02-23 DIAGNOSIS — I10 Essential (primary) hypertension: Secondary | ICD-10-CM | POA: Diagnosis not present

## 2022-02-23 DIAGNOSIS — E1151 Type 2 diabetes mellitus with diabetic peripheral angiopathy without gangrene: Secondary | ICD-10-CM | POA: Diagnosis not present

## 2022-02-23 DIAGNOSIS — E039 Hypothyroidism, unspecified: Secondary | ICD-10-CM | POA: Diagnosis not present

## 2022-03-01 DIAGNOSIS — K219 Gastro-esophageal reflux disease without esophagitis: Secondary | ICD-10-CM | POA: Diagnosis not present

## 2022-03-01 DIAGNOSIS — Z7902 Long term (current) use of antithrombotics/antiplatelets: Secondary | ICD-10-CM | POA: Diagnosis not present

## 2022-03-01 DIAGNOSIS — M791 Myalgia, unspecified site: Secondary | ICD-10-CM | POA: Diagnosis not present

## 2022-03-01 DIAGNOSIS — M7062 Trochanteric bursitis, left hip: Secondary | ICD-10-CM | POA: Diagnosis not present

## 2022-03-01 DIAGNOSIS — J45909 Unspecified asthma, uncomplicated: Secondary | ICD-10-CM | POA: Diagnosis not present

## 2022-03-01 DIAGNOSIS — F32A Depression, unspecified: Secondary | ICD-10-CM | POA: Diagnosis not present

## 2022-03-01 DIAGNOSIS — I70213 Atherosclerosis of native arteries of extremities with intermittent claudication, bilateral legs: Secondary | ICD-10-CM | POA: Diagnosis not present

## 2022-03-01 DIAGNOSIS — M159 Polyosteoarthritis, unspecified: Secondary | ICD-10-CM | POA: Diagnosis not present

## 2022-03-01 DIAGNOSIS — Z886 Allergy status to analgesic agent status: Secondary | ICD-10-CM | POA: Diagnosis not present

## 2022-03-01 DIAGNOSIS — M7061 Trochanteric bursitis, right hip: Secondary | ICD-10-CM | POA: Diagnosis not present

## 2022-03-01 DIAGNOSIS — M069 Rheumatoid arthritis, unspecified: Secondary | ICD-10-CM | POA: Diagnosis not present

## 2022-03-01 DIAGNOSIS — E1151 Type 2 diabetes mellitus with diabetic peripheral angiopathy without gangrene: Secondary | ICD-10-CM | POA: Diagnosis not present

## 2022-03-01 DIAGNOSIS — E785 Hyperlipidemia, unspecified: Secondary | ICD-10-CM | POA: Diagnosis not present

## 2022-03-01 DIAGNOSIS — N390 Urinary tract infection, site not specified: Secondary | ICD-10-CM | POA: Diagnosis not present

## 2022-03-01 DIAGNOSIS — I4891 Unspecified atrial fibrillation: Secondary | ICD-10-CM | POA: Diagnosis not present

## 2022-03-01 DIAGNOSIS — I872 Venous insufficiency (chronic) (peripheral): Secondary | ICD-10-CM | POA: Diagnosis not present

## 2022-03-01 DIAGNOSIS — E039 Hypothyroidism, unspecified: Secondary | ICD-10-CM | POA: Diagnosis not present

## 2022-03-01 DIAGNOSIS — I10 Essential (primary) hypertension: Secondary | ICD-10-CM | POA: Diagnosis not present

## 2022-03-01 DIAGNOSIS — Z85048 Personal history of other malignant neoplasm of rectum, rectosigmoid junction, and anus: Secondary | ICD-10-CM | POA: Diagnosis not present

## 2022-03-01 DIAGNOSIS — H4010X0 Unspecified open-angle glaucoma, stage unspecified: Secondary | ICD-10-CM | POA: Diagnosis not present

## 2022-03-01 DIAGNOSIS — Z885 Allergy status to narcotic agent status: Secondary | ICD-10-CM | POA: Diagnosis not present

## 2022-03-01 DIAGNOSIS — Z8673 Personal history of transient ischemic attack (TIA), and cerebral infarction without residual deficits: Secondary | ICD-10-CM | POA: Diagnosis not present

## 2022-03-02 DIAGNOSIS — Z8673 Personal history of transient ischemic attack (TIA), and cerebral infarction without residual deficits: Secondary | ICD-10-CM | POA: Diagnosis not present

## 2022-03-02 DIAGNOSIS — E785 Hyperlipidemia, unspecified: Secondary | ICD-10-CM | POA: Diagnosis not present

## 2022-03-02 DIAGNOSIS — F32A Depression, unspecified: Secondary | ICD-10-CM | POA: Diagnosis not present

## 2022-03-02 DIAGNOSIS — E1151 Type 2 diabetes mellitus with diabetic peripheral angiopathy without gangrene: Secondary | ICD-10-CM | POA: Diagnosis not present

## 2022-03-02 DIAGNOSIS — N390 Urinary tract infection, site not specified: Secondary | ICD-10-CM | POA: Diagnosis not present

## 2022-03-02 DIAGNOSIS — I10 Essential (primary) hypertension: Secondary | ICD-10-CM | POA: Diagnosis not present

## 2022-03-02 DIAGNOSIS — I70213 Atherosclerosis of native arteries of extremities with intermittent claudication, bilateral legs: Secondary | ICD-10-CM | POA: Diagnosis not present

## 2022-03-02 DIAGNOSIS — E039 Hypothyroidism, unspecified: Secondary | ICD-10-CM | POA: Diagnosis not present

## 2022-03-02 DIAGNOSIS — M069 Rheumatoid arthritis, unspecified: Secondary | ICD-10-CM | POA: Diagnosis not present

## 2022-03-02 DIAGNOSIS — I4891 Unspecified atrial fibrillation: Secondary | ICD-10-CM | POA: Diagnosis not present

## 2022-03-02 DIAGNOSIS — J45909 Unspecified asthma, uncomplicated: Secondary | ICD-10-CM | POA: Diagnosis not present

## 2022-03-02 DIAGNOSIS — Z85048 Personal history of other malignant neoplasm of rectum, rectosigmoid junction, and anus: Secondary | ICD-10-CM | POA: Diagnosis not present

## 2022-03-02 DIAGNOSIS — Z7902 Long term (current) use of antithrombotics/antiplatelets: Secondary | ICD-10-CM | POA: Diagnosis not present

## 2022-03-02 DIAGNOSIS — H4010X0 Unspecified open-angle glaucoma, stage unspecified: Secondary | ICD-10-CM | POA: Diagnosis not present

## 2022-03-02 DIAGNOSIS — I872 Venous insufficiency (chronic) (peripheral): Secondary | ICD-10-CM | POA: Diagnosis not present

## 2022-03-02 DIAGNOSIS — K219 Gastro-esophageal reflux disease without esophagitis: Secondary | ICD-10-CM | POA: Diagnosis not present

## 2022-03-04 DIAGNOSIS — I4891 Unspecified atrial fibrillation: Secondary | ICD-10-CM | POA: Diagnosis not present

## 2022-03-04 DIAGNOSIS — Z7902 Long term (current) use of antithrombotics/antiplatelets: Secondary | ICD-10-CM | POA: Diagnosis not present

## 2022-03-04 DIAGNOSIS — Z8673 Personal history of transient ischemic attack (TIA), and cerebral infarction without residual deficits: Secondary | ICD-10-CM | POA: Diagnosis not present

## 2022-03-04 DIAGNOSIS — I10 Essential (primary) hypertension: Secondary | ICD-10-CM | POA: Diagnosis not present

## 2022-03-04 DIAGNOSIS — J45909 Unspecified asthma, uncomplicated: Secondary | ICD-10-CM | POA: Diagnosis not present

## 2022-03-04 DIAGNOSIS — E1151 Type 2 diabetes mellitus with diabetic peripheral angiopathy without gangrene: Secondary | ICD-10-CM | POA: Diagnosis not present

## 2022-03-04 DIAGNOSIS — E039 Hypothyroidism, unspecified: Secondary | ICD-10-CM | POA: Diagnosis not present

## 2022-03-04 DIAGNOSIS — F32A Depression, unspecified: Secondary | ICD-10-CM | POA: Diagnosis not present

## 2022-03-04 DIAGNOSIS — N390 Urinary tract infection, site not specified: Secondary | ICD-10-CM | POA: Diagnosis not present

## 2022-03-04 DIAGNOSIS — I872 Venous insufficiency (chronic) (peripheral): Secondary | ICD-10-CM | POA: Diagnosis not present

## 2022-03-04 DIAGNOSIS — Z85048 Personal history of other malignant neoplasm of rectum, rectosigmoid junction, and anus: Secondary | ICD-10-CM | POA: Diagnosis not present

## 2022-03-04 DIAGNOSIS — E785 Hyperlipidemia, unspecified: Secondary | ICD-10-CM | POA: Diagnosis not present

## 2022-03-04 DIAGNOSIS — H4010X0 Unspecified open-angle glaucoma, stage unspecified: Secondary | ICD-10-CM | POA: Diagnosis not present

## 2022-03-04 DIAGNOSIS — I70213 Atherosclerosis of native arteries of extremities with intermittent claudication, bilateral legs: Secondary | ICD-10-CM | POA: Diagnosis not present

## 2022-03-04 DIAGNOSIS — K219 Gastro-esophageal reflux disease without esophagitis: Secondary | ICD-10-CM | POA: Diagnosis not present

## 2022-03-04 DIAGNOSIS — M069 Rheumatoid arthritis, unspecified: Secondary | ICD-10-CM | POA: Diagnosis not present

## 2022-03-08 DIAGNOSIS — I10 Essential (primary) hypertension: Secondary | ICD-10-CM | POA: Diagnosis not present

## 2022-03-08 DIAGNOSIS — N390 Urinary tract infection, site not specified: Secondary | ICD-10-CM | POA: Diagnosis not present

## 2022-03-08 DIAGNOSIS — H4010X0 Unspecified open-angle glaucoma, stage unspecified: Secondary | ICD-10-CM | POA: Diagnosis not present

## 2022-03-08 DIAGNOSIS — E785 Hyperlipidemia, unspecified: Secondary | ICD-10-CM | POA: Diagnosis not present

## 2022-03-08 DIAGNOSIS — K219 Gastro-esophageal reflux disease without esophagitis: Secondary | ICD-10-CM | POA: Diagnosis not present

## 2022-03-08 DIAGNOSIS — Z7902 Long term (current) use of antithrombotics/antiplatelets: Secondary | ICD-10-CM | POA: Diagnosis not present

## 2022-03-08 DIAGNOSIS — I70213 Atherosclerosis of native arteries of extremities with intermittent claudication, bilateral legs: Secondary | ICD-10-CM | POA: Diagnosis not present

## 2022-03-08 DIAGNOSIS — M069 Rheumatoid arthritis, unspecified: Secondary | ICD-10-CM | POA: Diagnosis not present

## 2022-03-08 DIAGNOSIS — Z85048 Personal history of other malignant neoplasm of rectum, rectosigmoid junction, and anus: Secondary | ICD-10-CM | POA: Diagnosis not present

## 2022-03-08 DIAGNOSIS — Z8673 Personal history of transient ischemic attack (TIA), and cerebral infarction without residual deficits: Secondary | ICD-10-CM | POA: Diagnosis not present

## 2022-03-08 DIAGNOSIS — E039 Hypothyroidism, unspecified: Secondary | ICD-10-CM | POA: Diagnosis not present

## 2022-03-08 DIAGNOSIS — E1151 Type 2 diabetes mellitus with diabetic peripheral angiopathy without gangrene: Secondary | ICD-10-CM | POA: Diagnosis not present

## 2022-03-08 DIAGNOSIS — F32A Depression, unspecified: Secondary | ICD-10-CM | POA: Diagnosis not present

## 2022-03-08 DIAGNOSIS — I872 Venous insufficiency (chronic) (peripheral): Secondary | ICD-10-CM | POA: Diagnosis not present

## 2022-03-08 DIAGNOSIS — I4891 Unspecified atrial fibrillation: Secondary | ICD-10-CM | POA: Diagnosis not present

## 2022-03-08 DIAGNOSIS — J45909 Unspecified asthma, uncomplicated: Secondary | ICD-10-CM | POA: Diagnosis not present

## 2022-03-09 DIAGNOSIS — Z85048 Personal history of other malignant neoplasm of rectum, rectosigmoid junction, and anus: Secondary | ICD-10-CM | POA: Diagnosis not present

## 2022-03-09 DIAGNOSIS — F32A Depression, unspecified: Secondary | ICD-10-CM | POA: Diagnosis not present

## 2022-03-09 DIAGNOSIS — I10 Essential (primary) hypertension: Secondary | ICD-10-CM | POA: Diagnosis not present

## 2022-03-09 DIAGNOSIS — E1151 Type 2 diabetes mellitus with diabetic peripheral angiopathy without gangrene: Secondary | ICD-10-CM | POA: Diagnosis not present

## 2022-03-09 DIAGNOSIS — E785 Hyperlipidemia, unspecified: Secondary | ICD-10-CM | POA: Diagnosis not present

## 2022-03-09 DIAGNOSIS — J45909 Unspecified asthma, uncomplicated: Secondary | ICD-10-CM | POA: Diagnosis not present

## 2022-03-09 DIAGNOSIS — Z7902 Long term (current) use of antithrombotics/antiplatelets: Secondary | ICD-10-CM | POA: Diagnosis not present

## 2022-03-09 DIAGNOSIS — N390 Urinary tract infection, site not specified: Secondary | ICD-10-CM | POA: Diagnosis not present

## 2022-03-09 DIAGNOSIS — I70213 Atherosclerosis of native arteries of extremities with intermittent claudication, bilateral legs: Secondary | ICD-10-CM | POA: Diagnosis not present

## 2022-03-09 DIAGNOSIS — M069 Rheumatoid arthritis, unspecified: Secondary | ICD-10-CM | POA: Diagnosis not present

## 2022-03-09 DIAGNOSIS — I872 Venous insufficiency (chronic) (peripheral): Secondary | ICD-10-CM | POA: Diagnosis not present

## 2022-03-09 DIAGNOSIS — E039 Hypothyroidism, unspecified: Secondary | ICD-10-CM | POA: Diagnosis not present

## 2022-03-09 DIAGNOSIS — K219 Gastro-esophageal reflux disease without esophagitis: Secondary | ICD-10-CM | POA: Diagnosis not present

## 2022-03-09 DIAGNOSIS — Z8673 Personal history of transient ischemic attack (TIA), and cerebral infarction without residual deficits: Secondary | ICD-10-CM | POA: Diagnosis not present

## 2022-03-09 DIAGNOSIS — I4891 Unspecified atrial fibrillation: Secondary | ICD-10-CM | POA: Diagnosis not present

## 2022-03-09 DIAGNOSIS — H4010X0 Unspecified open-angle glaucoma, stage unspecified: Secondary | ICD-10-CM | POA: Diagnosis not present

## 2022-03-11 DIAGNOSIS — I4891 Unspecified atrial fibrillation: Secondary | ICD-10-CM | POA: Diagnosis not present

## 2022-03-11 DIAGNOSIS — Z85048 Personal history of other malignant neoplasm of rectum, rectosigmoid junction, and anus: Secondary | ICD-10-CM | POA: Diagnosis not present

## 2022-03-11 DIAGNOSIS — H4010X0 Unspecified open-angle glaucoma, stage unspecified: Secondary | ICD-10-CM | POA: Diagnosis not present

## 2022-03-11 DIAGNOSIS — I10 Essential (primary) hypertension: Secondary | ICD-10-CM | POA: Diagnosis not present

## 2022-03-11 DIAGNOSIS — J45909 Unspecified asthma, uncomplicated: Secondary | ICD-10-CM | POA: Diagnosis not present

## 2022-03-11 DIAGNOSIS — E039 Hypothyroidism, unspecified: Secondary | ICD-10-CM | POA: Diagnosis not present

## 2022-03-11 DIAGNOSIS — K219 Gastro-esophageal reflux disease without esophagitis: Secondary | ICD-10-CM | POA: Diagnosis not present

## 2022-03-11 DIAGNOSIS — I499 Cardiac arrhythmia, unspecified: Secondary | ICD-10-CM | POA: Diagnosis not present

## 2022-03-11 DIAGNOSIS — N390 Urinary tract infection, site not specified: Secondary | ICD-10-CM | POA: Diagnosis not present

## 2022-03-11 DIAGNOSIS — M069 Rheumatoid arthritis, unspecified: Secondary | ICD-10-CM | POA: Diagnosis not present

## 2022-03-11 DIAGNOSIS — Z8673 Personal history of transient ischemic attack (TIA), and cerebral infarction without residual deficits: Secondary | ICD-10-CM | POA: Diagnosis not present

## 2022-03-11 DIAGNOSIS — E785 Hyperlipidemia, unspecified: Secondary | ICD-10-CM | POA: Diagnosis not present

## 2022-03-11 DIAGNOSIS — Z760 Encounter for issue of repeat prescription: Secondary | ICD-10-CM | POA: Diagnosis not present

## 2022-03-11 DIAGNOSIS — R569 Unspecified convulsions: Secondary | ICD-10-CM | POA: Diagnosis not present

## 2022-03-11 DIAGNOSIS — I70213 Atherosclerosis of native arteries of extremities with intermittent claudication, bilateral legs: Secondary | ICD-10-CM | POA: Diagnosis not present

## 2022-03-11 DIAGNOSIS — Z7902 Long term (current) use of antithrombotics/antiplatelets: Secondary | ICD-10-CM | POA: Diagnosis not present

## 2022-03-11 DIAGNOSIS — F32A Depression, unspecified: Secondary | ICD-10-CM | POA: Diagnosis not present

## 2022-03-11 DIAGNOSIS — R0789 Other chest pain: Secondary | ICD-10-CM | POA: Diagnosis not present

## 2022-03-11 DIAGNOSIS — I872 Venous insufficiency (chronic) (peripheral): Secondary | ICD-10-CM | POA: Diagnosis not present

## 2022-03-11 DIAGNOSIS — E1151 Type 2 diabetes mellitus with diabetic peripheral angiopathy without gangrene: Secondary | ICD-10-CM | POA: Diagnosis not present

## 2022-03-12 ENCOUNTER — Ambulatory Visit
Admission: RE | Admit: 2022-03-12 | Discharge: 2022-03-12 | Disposition: A | Discharge status: Home / Self Care | Payer: Medicare Other | Source: Ambulatory Visit | Attending: Family Medicine | Admitting: Family Medicine

## 2022-03-12 ENCOUNTER — Other Ambulatory Visit: Payer: Self-pay | Admitting: Family Medicine

## 2022-03-12 DIAGNOSIS — R5381 Other malaise: Secondary | ICD-10-CM | POA: Diagnosis not present

## 2022-03-12 DIAGNOSIS — I700 Atherosclerosis of aorta: Secondary | ICD-10-CM | POA: Diagnosis not present

## 2022-03-12 DIAGNOSIS — R0789 Other chest pain: Secondary | ICD-10-CM

## 2022-03-12 DIAGNOSIS — I2510 Atherosclerotic heart disease of native coronary artery without angina pectoris: Secondary | ICD-10-CM | POA: Diagnosis not present

## 2022-03-15 ENCOUNTER — Emergency Department (HOSPITAL_COMMUNITY)
Admission: EM | Admit: 2022-03-15 | Discharge: 2022-03-15 | Disposition: A | Discharge status: Home / Self Care | Payer: Medicare Other | Attending: Emergency Medicine | Admitting: Emergency Medicine

## 2022-03-15 ENCOUNTER — Encounter (HOSPITAL_COMMUNITY): Payer: Self-pay | Admitting: Emergency Medicine

## 2022-03-15 ENCOUNTER — Emergency Department (HOSPITAL_COMMUNITY): Payer: Medicare Other

## 2022-03-15 ENCOUNTER — Encounter (HOSPITAL_COMMUNITY): Payer: Self-pay

## 2022-03-15 ENCOUNTER — Other Ambulatory Visit: Payer: Self-pay

## 2022-03-15 ENCOUNTER — Ambulatory Visit (HOSPITAL_COMMUNITY)
Admission: RE | Admit: 2022-03-15 | Discharge: 2022-03-15 | Disposition: A | Discharge status: Home / Self Care | Payer: Medicare Other | Source: Ambulatory Visit | Attending: Hematology | Admitting: Hematology

## 2022-03-15 DIAGNOSIS — Z7951 Long term (current) use of inhaled steroids: Secondary | ICD-10-CM | POA: Insufficient documentation

## 2022-03-15 DIAGNOSIS — Z96611 Presence of right artificial shoulder joint: Secondary | ICD-10-CM | POA: Diagnosis not present

## 2022-03-15 DIAGNOSIS — J45909 Unspecified asthma, uncomplicated: Secondary | ICD-10-CM | POA: Diagnosis not present

## 2022-03-15 DIAGNOSIS — F0390 Unspecified dementia without behavioral disturbance: Secondary | ICD-10-CM | POA: Diagnosis not present

## 2022-03-15 DIAGNOSIS — I10 Essential (primary) hypertension: Secondary | ICD-10-CM | POA: Diagnosis not present

## 2022-03-15 DIAGNOSIS — R251 Tremor, unspecified: Secondary | ICD-10-CM | POA: Insufficient documentation

## 2022-03-15 DIAGNOSIS — C20 Malignant neoplasm of rectum: Secondary | ICD-10-CM | POA: Diagnosis not present

## 2022-03-15 DIAGNOSIS — R531 Weakness: Secondary | ICD-10-CM | POA: Insufficient documentation

## 2022-03-15 DIAGNOSIS — N281 Cyst of kidney, acquired: Secondary | ICD-10-CM | POA: Diagnosis not present

## 2022-03-15 DIAGNOSIS — Z20822 Contact with and (suspected) exposure to covid-19: Secondary | ICD-10-CM | POA: Insufficient documentation

## 2022-03-15 DIAGNOSIS — Z7902 Long term (current) use of antithrombotics/antiplatelets: Secondary | ICD-10-CM | POA: Diagnosis not present

## 2022-03-15 DIAGNOSIS — Z7901 Long term (current) use of anticoagulants: Secondary | ICD-10-CM | POA: Diagnosis not present

## 2022-03-15 DIAGNOSIS — Z79899 Other long term (current) drug therapy: Secondary | ICD-10-CM | POA: Insufficient documentation

## 2022-03-15 DIAGNOSIS — Z85048 Personal history of other malignant neoplasm of rectum, rectosigmoid junction, and anus: Secondary | ICD-10-CM | POA: Insufficient documentation

## 2022-03-15 DIAGNOSIS — R079 Chest pain, unspecified: Secondary | ICD-10-CM | POA: Insufficient documentation

## 2022-03-15 DIAGNOSIS — J8410 Pulmonary fibrosis, unspecified: Secondary | ICD-10-CM | POA: Diagnosis not present

## 2022-03-15 DIAGNOSIS — J479 Bronchiectasis, uncomplicated: Secondary | ICD-10-CM | POA: Diagnosis not present

## 2022-03-15 DIAGNOSIS — C210 Malignant neoplasm of anus, unspecified: Secondary | ICD-10-CM

## 2022-03-15 DIAGNOSIS — E119 Type 2 diabetes mellitus without complications: Secondary | ICD-10-CM | POA: Insufficient documentation

## 2022-03-15 DIAGNOSIS — R569 Unspecified convulsions: Secondary | ICD-10-CM | POA: Diagnosis not present

## 2022-03-15 HISTORY — DX: Unspecified convulsions: R56.9

## 2022-03-15 LAB — URINALYSIS, W/ REFLEX TO CULTURE (INFECTION SUSPECTED)
Bilirubin Urine: NEGATIVE
Glucose, UA: NEGATIVE mg/dL
Ketones, ur: NEGATIVE mg/dL
Leukocytes,Ua: NEGATIVE
Nitrite: NEGATIVE
Protein, ur: NEGATIVE mg/dL
Specific Gravity, Urine: 1.004 — ABNORMAL LOW (ref 1.005–1.030)
pH: 5 (ref 5.0–8.0)

## 2022-03-15 LAB — CBC WITH DIFFERENTIAL/PLATELET
Abs Immature Granulocytes: 0.21 10*3/uL — ABNORMAL HIGH (ref 0.00–0.07)
Basophils Absolute: 0 10*3/uL (ref 0.0–0.1)
Basophils Relative: 1 %
Eosinophils Absolute: 0.1 10*3/uL (ref 0.0–0.5)
Eosinophils Relative: 2 %
HCT: 39.9 % (ref 36.0–46.0)
Hemoglobin: 13.1 g/dL (ref 12.0–15.0)
Immature Granulocytes: 3 %
Lymphocytes Relative: 16 %
Lymphs Abs: 1.1 10*3/uL (ref 0.7–4.0)
MCH: 32.2 pg (ref 26.0–34.0)
MCHC: 32.8 g/dL (ref 30.0–36.0)
MCV: 98 fL (ref 80.0–100.0)
Monocytes Absolute: 0.6 10*3/uL (ref 0.1–1.0)
Monocytes Relative: 9 %
Neutro Abs: 4.7 10*3/uL (ref 1.7–7.7)
Neutrophils Relative %: 69 %
Platelets: 200 10*3/uL (ref 150–400)
RBC: 4.07 MIL/uL (ref 3.87–5.11)
RDW: 14.6 % (ref 11.5–15.5)
WBC: 6.7 10*3/uL (ref 4.0–10.5)
nRBC: 0 % (ref 0.0–0.2)

## 2022-03-15 LAB — COMPREHENSIVE METABOLIC PANEL
ALT: 20 U/L (ref 0–44)
AST: 25 U/L (ref 15–41)
Albumin: 4.2 g/dL (ref 3.5–5.0)
Alkaline Phosphatase: 50 U/L (ref 38–126)
Anion gap: 9 (ref 5–15)
BUN: 16 mg/dL (ref 8–23)
CO2: 23 mmol/L (ref 22–32)
Calcium: 9 mg/dL (ref 8.9–10.3)
Chloride: 103 mmol/L (ref 98–111)
Creatinine, Ser: 0.84 mg/dL (ref 0.44–1.00)
GFR, Estimated: >60 mL/min (ref >60)
Glucose, Bld: 119 mg/dL — ABNORMAL HIGH (ref 70–99)
Potassium: 3.8 mmol/L (ref 3.5–5.1)
Sodium: 135 mmol/L (ref 135–145)
Total Bilirubin: 0.8 mg/dL (ref 0.3–1.2)
Total Protein: 7.6 g/dL (ref 6.5–8.1)

## 2022-03-15 LAB — GLUCOSE, CAPILLARY: Glucose-Capillary: 147 mg/dL — ABNORMAL HIGH (ref 70–99)

## 2022-03-15 LAB — RESP PANEL BY RT-PCR (RSV, FLU A&B, COVID)  RVPGX2
Influenza A by PCR: NEGATIVE
Influenza B by PCR: NEGATIVE
Resp Syncytial Virus by PCR: NEGATIVE
SARS Coronavirus 2 by RT PCR: NEGATIVE

## 2022-03-15 MED ORDER — IOHEXOL 9 MG/ML PO SOLN
500.0000 mL | ORAL | Status: DC
  Administered 2022-03-15: 1000 mL via ORAL

## 2022-03-15 MED ORDER — IOHEXOL 9 MG/ML PO SOLN
ORAL | Status: AC
  Filled 2022-03-15: qty 1000

## 2022-03-15 MED ORDER — IOHEXOL 300 MG/ML  SOLN
100.0000 mL | Freq: Once | INTRAMUSCULAR | Status: AC | PRN
  Administered 2022-03-15: 100 mL via INTRAVENOUS

## 2022-03-15 MED ORDER — SODIUM CHLORIDE (PF) 0.9 % IJ SOLN
INTRAMUSCULAR | Status: AC
  Filled 2022-03-15: qty 50

## 2022-03-15 MED ORDER — SODIUM CHLORIDE 0.9 % IV BOLUS
1000.0000 mL | Freq: Once | INTRAVENOUS | Status: AC
  Administered 2022-03-15: 1000 mL via INTRAVENOUS

## 2022-03-15 MED ORDER — ACETAMINOPHEN 500 MG PO TABS
1000.0000 mg | ORAL_TABLET | Freq: Once | ORAL | Status: AC
  Administered 2022-03-15: 1000 mg via ORAL
  Filled 2022-03-15: qty 2

## 2022-03-15 NOTE — Discharge Instructions (Addendum)
We evaluated you for your shaking movement.  Your CT scans did not show any severe problem.  Your chest CT scan did show signs of pulmonary fibrosis, which can cause difficulty breathing.  Please follow-up with your primary doctor.  You may need to stop the medicine you are taking called amiodarone as this can cause lung issues.  Please schedule appointment with a pulmonologist.  Please also follow-up with Dr. Delice Lesch for your tremors.  Please continue to take your antiseizure medicine as scheduled. Please return if you have any worsening symptoms, seizures, headaches, vomiting, fevers or chills, cough, or any other concerning symptoms.

## 2022-03-15 NOTE — ED Triage Notes (Signed)
Patient presents for CT due to shaking. She first experienced this at the end of January. She was hospitalized and told she had a seizure. While in the hospital, she did not experience shaking, but once discharged she began to shake again. She is unsure of what she was having a CT of, but she believes it may have been of her chest.

## 2022-03-15 NOTE — ED Provider Notes (Signed)
[facility] Provider Note  CSN: [id number] Arrival date & time: 03/15/22 1128  Chief Complaint(s) Tremors  HPI Lorraine Gilbert is a 89 y.o. female with history of rheumatoid arthritis, A-fib, anal cancer status post surgery and radiation presenting to the emergency department for abnormal episode.  Patient reports that she was on her way to get a CT scan, while waiting for CT scan she reports she had shaking throughout her whole body.  She never lost consciousness, never felt confused, denied any headaches, and never had any other symptoms.  She reports that this happens relatively often to her.  She has been compliant with her antiepileptic medications.  She denies that her shaking episode was on one side or the other.  She denies any infectious symptoms such as fevers, chills, cough, dysuria, abdominal pain, nausea, vomiting, diarrhea.   Past Medical History Past Medical History:  Diagnosis Date   A-fib ([place])    Abnormal heart rhythm    Allergic rhinitis    anal ca 01/2018   with mets to left inguinal ln 01/2018   Anxiety    Asthma    Diabetes mellitus without complication (HCC)    Type II   GERD (gastroesophageal reflux disease)    Headache    History of radiation therapy 07/05/2018-07/18/2018   left pelvis        Dr Lorraine Gilbert   History of radiation therapy    Anus- 03/30/21-05/07/21- Dr. Gery Gilbert   Hypertension    Neuromuscular disorder [facility])    Siactic pain in right leg   Osteopenia    Rheumatoid arthritis (Lorraine Gilbert)    Rotator cuff tear arthropathy    Right   Seizures (Lorraine Gilbert)    Vertigo    Patient Active Problem List   Diagnosis Date Noted   Fecal smearing 10/23/2021   Dehydration 05/12/2021   SBO (small bowel obstruction) ([place]) 05/12/2021   Rib pain on right side 03/30/2021   Hypertension 03/30/2021   Anal cancer ([place]) 03/10/2021   Hemorrhage of anus and rectum 03/09/2021   Anal lesion 02/26/2021   Anxiety 05/04/2020    Bowel incontinence 05/04/2020   Diabetes mellitus without complication (Lorraine Gilbert) [id number]   Fatty liver 05/04/2020   Hardening of the aorta (main artery of the heart) (Lorraine Gilbert) 05/04/2020   Inflammatory and toxic neuropathy (Lorraine Gilbert) 05/04/2020   Mild dementia (Lorraine Gilbert) 05/04/2020   Irritable bowel syndrome 05/04/2020   Polymyalgia rheumatica (Lorraine Gilbert) 05/04/2020   Primary open angle glaucoma (POAG) of both eyes, mild stage 05/04/2020   Type 2 diabetes mellitus with other diabetic ophthalmic complication (Lorraine Gilbert) [id number]   Symptomatic sinus bradycardia 05/04/2020   Chronic venous insufficiency 12/14/2019   Monckeberg's medial calcinosis 12/14/2019   Allergic rhinitis 08/07/2019   Abnormal findings on diagnostic imaging of lung 08/07/2019   Atherosclerosis of native artery of extremity with intermittent claudication (Lorraine Gilbert) 06/12/2019   Degeneration of lumbar intervertebral disc 06/07/2019   Pain in right leg 05/22/2019   DDD (degenerative disc disease), cervical 05/17/2019   Pulmonary nodule 03/24/2018   Metastatic squamous cell carcinoma involving lymph node with unknown primary site (Lorraine Gilbert) 03/15/2018   S/p reverse total shoulder arthroplasty 08/04/2017   Mild cognitive impairment 10/10/2016   Heart valve disease 04/06/2016   Memory loss 08/20/2014   Stabbing headache 08/20/2014   Essential hypertension 08/20/2014   Hyperlipidemia 08/20/2014   Type 2 diabetes mellitus, uncontrolled 08/20/2014   Atrial fibrillation (Lorraine Gilbert) 08/20/2014   Rheumatoid arthritis-On Methotrexate  11/01/2013   Upper  airway cough syndrome 07/24/2012   Cough variant asthma vs UACS 07/24/2012   Chest pain 02/17/2011   Pulmonary HTN (Lorraine Gilbert) 02/17/2011   Home Medication(s) Prior to Admission medications   Medication Sig Start Date End Date Taking? Authorizing Provider  ACCU-CHEK AVIVA PLUS test strip 1 each as directed. 03/07/19   [provider]  Accu-Chek Softclix Lancets lancets 1 each by Other route as directed.  03/07/19   [provider]  ACETAMINOPHEN 8 HOUR PO Take 1 tablet by mouth as needed.    [provider]  albuterol (PROVENTIL) (2.5 MG/3ML) 0.083% nebulizer solution SMARTSIG:1 Vial(s) Via Nebulizer Every 4-6 Hours PRN 12/26/20   [provider]  Alcohol Swabs (ALCOHOL WIPES) 70 % PADS DropSafe Alcohol Prep Pads    [provider]  amiodarone (PACERONE) 100 MG tablet Take 100 mg by mouth daily. 03/14/21   [provider]  Blood Glucose Calibration (ACCU-CHEK AVIVA) SOLN  04/18/19   [provider]  Blood Glucose Monitoring Suppl (TRUE METRIX AIR GLUCOSE METER) w/Device KIT See admin instructions. 06/14/19   [provider]  clopidogrel (PLAVIX) 75 MG tablet Take 75 mg by mouth daily. 03/20/21   [provider]  diclofenac Sodium (VOLTAREN) 1 % GEL Apply topically. 03/25/21   [provider]  ELIQUIS 2.5 MG TABS tablet Take 2.5 mg by mouth 2 (two) times daily. 03/17/21   [provider]  escitalopram (LEXAPRO) 10 MG tablet Take 10 mg by mouth daily.    [provider]  folic acid (FOLVITE) 1 MG tablet Take by mouth. Patient not taking: Reported on 06/01/2021    [provider]  hydrocortisone (ANUSOL-HC) 25 MG suppository Place 1 suppository (25 mg total) rectally 2 (two) times daily. Patient not taking: Reported on 11/12/2021 08/13/21   Lorraine Pray, MD  levalbuterol Penne Lash) 1.25 MG/3ML nebulizer solution Inhale into the lungs. 02/23/21   [provider]  megestrol (MEGACE) 400 MG/10ML suspension Take 10 mLs (400 mg total) by mouth daily. 10/13/21   Lorraine Merle, MD  mirtazapine (REMERON) 7.5 MG tablet Take 1 tablet (7.5 mg total) by mouth at bedtime. 04/16/21   Lorraine Feeling, NP  nitroGLYCERIN (NITROSTAT) 0.4 MG SL tablet Place 0.4 mg under the tongue every 5 (five) minutes as needed for chest pain. Patient not taking: Reported on 03/30/2021 05/13/18   [provider]  ondansetron  (ZOFRAN) 4 MG tablet Take 1 tablet (4 mg total) by mouth every 8 (eight) hours as needed for nausea or vomiting. Patient not taking: Reported on 03/30/2021 03/26/21   Lorraine Merle, MD  polyethylene glycol powder [facility]) 17 GM/SCOOP powder Take by mouth.    [provider]  prochlorperazine (COMPAZINE) 10 MG tablet Take 1 tablet (10 mg total) by mouth every 6 (six) hours as needed for nausea or vomiting. Patient not taking: Reported on 03/30/2021 03/26/21   Lorraine Merle, MD  rosuvastatin (CRESTOR) 5 MG tablet Take by mouth. 04/10/20   [provider]  Donnal Debar 200-62.5-25 MCG/ACT AEPB Take 1 puff by mouth daily. 12/26/20   [provider]  Vitamin D, Ergocalciferol, 50000 units CAPS 1 capsule    [provider]  Past Surgical History Past Surgical History:  Procedure Laterality Date   BIOPSY  12/13/2018   Procedure: BIOPSY;  Surgeon: Wonda Horner, MD;  Location: [facility] ENDOSCOPY;  Service: Endoscopy;;   CATARACT EXTRACTION     bilateral   COLONOSCOPY WITH PROPOFOL N/A 12/13/2018   Procedure: COLONOSCOPY WITH PROPOFOL;  Surgeon: Wonda Horner, MD;  Location: [facility] ENDOSCOPY;  Service: Endoscopy;  Laterality: N/A;   ESOPHAGOGASTRODUODENOSCOPY N/A 12/13/2018   Procedure: ESOPHAGOGASTRODUODENOSCOPY (EGD);  Surgeon: Wonda Horner, MD;  Location: [facility];  Service: Endoscopy;  Laterality: N/A;   EYE SURGERY     POLYPECTOMY  12/13/2018   Procedure: POLYPECTOMY;  Surgeon: Wonda Horner, MD;  Location: [facility] ENDOSCOPY;  Service: Endoscopy;;   REVERSE SHOULDER ARTHROPLASTY Right 08/04/2017   REVERSE SHOULDER ARTHROPLASTY Right 08/04/2017   Procedure: RIGHT REVERSE SHOULDER ARTHROPLASTY;  Surgeon: Justice Britain, MD;  Location: [facility];  Service: Orthopedics;  Laterality: Right;   TUBAL LIGATION     Family History Family History   Problem Relation Age of Onset   Emphysema Father        smoked   Heart disease Mother    Heart attack Son    Breast cancer Neg Hx     Social History Social History   Tobacco Use   Smoking status: Never   Smokeless tobacco: Never  Vaping Use   Vaping Use: Never used  Substance Use Topics   Alcohol use: No    Alcohol/week: 0.0 standard drinks of alcohol   Drug use: No   Allergies Ibuprofen  Review of Systems Review of Systems  All other systems reviewed and are negative.   Physical Exam Vital Signs  I have reviewed the triage vital signs BP (!) 155/95   Pulse 83   Temp 99.4 F (37.4 C) (Oral)   Resp 20   SpO2 99%  Physical Exam Vitals and nursing note reviewed.  Constitutional:      General: She is not in acute distress.    Appearance: She is well-developed.  HENT:     Head: Normocephalic and atraumatic.     Mouth/Throat:     Mouth: Mucous membranes are moist.  Eyes:     Pupils: Pupils are equal, round, and reactive to light.  Cardiovascular:     Rate and Rhythm: Normal rate and regular rhythm.     Heart sounds: No murmur heard. Pulmonary:     Effort: Pulmonary effort is normal. No respiratory distress.     Breath sounds: Normal breath sounds.  Abdominal:     General: Abdomen is flat.     Palpations: Abdomen is soft.     Tenderness: There is no abdominal tenderness.  Musculoskeletal:        General: No tenderness.     Right lower leg: No edema.     Left lower leg: No edema.  Skin:    General: Skin is warm and dry.  Neurological:     General: No focal deficit present.     Mental Status: She is alert. Mental status is at baseline.  Psychiatric:        Mood and Affect: Mood normal.        Behavior: Behavior normal.     ED Results and Treatments Labs (all labs ordered are listed, but only abnormal results are displayed) Labs Reviewed  COMPREHENSIVE METABOLIC PANEL - Abnormal; Notable for the following components:      Result Value   Glucose,  Bld 119 (*)  All other components within normal limits  CBC WITH DIFFERENTIAL/PLATELET - Abnormal; Notable for the following components:   Abs Immature Granulocytes 0.21 (*)    All other components within normal limits  URINALYSIS, W/ REFLEX TO CULTURE (INFECTION SUSPECTED) - Abnormal; Notable for the following components:   Color, Urine STRAW (*)    Specific Gravity, Urine 1.004 (*)    Hgb urine dipstick SMALL (*)    Bacteria, UA RARE (*)    All other components within normal limits  RESP PANEL BY RT-PCR (RSV, FLU A&B, COVID)  [id number]                                                                                                                          Radiology CT CHEST ABDOMEN PELVIS W CONTRAST  Result Date: 03/15/2022 CLINICAL DATA:  Metastatic disease evaluation, anal cancer * Tracking Code: BO * EXAM: CT CHEST, ABDOMEN, AND PELVIS WITH CONTRAST TECHNIQUE: Multidetector CT imaging of the chest, abdomen and pelvis was performed following the standard protocol during bolus administration of intravenous contrast. RADIATION DOSE REDUCTION: This exam was performed according to the departmental dose-optimization program which includes automated exposure control, adjustment of the mA and/or kV according to patient size and/or use of iterative reconstruction technique. CONTRAST:  185m OMNIPAQUE IOHEXOL 300 MG/ML SOLN additional oral enteric contrast COMPARISON:  PET-CT, 08/14/2021 FINDINGS: CT CHEST FINDINGS Cardiovascular: Aortic atherosclerosis. Normal heart size. Left and right coronary artery calcifications. No pericardial effusion. Mediastinum/Nodes: No enlarged mediastinal, hilar, or axillary lymph nodes. Thyroid gland, trachea, and esophagus demonstrate no significant findings. Lungs/Pleura: Mild pulmonary fibrosis in a pattern with apical to basal gradient, featuring irregular peripheral interstitial opacity, septal thickening, traction bronchiectasis, and small areas of subpleural  bronchiolectasis at the lung bases without clear evidence of honeycombing. No pleural effusion or pneumothorax. Musculoskeletal: No chest wall abnormality. No acute osseous findings. Status post right shoulder reverse arthroplasty. CT ABDOMEN PELVIS FINDINGS Hepatobiliary: No solid liver abnormality is seen. No gallstones, gallbladder wall thickening, or biliary dilatation. Pancreas: Unremarkable. No pancreatic ductal dilatation or surrounding inflammatory changes. Spleen: Normal in size without significant abnormality. Adrenals/Urinary Tract: Adrenal glands are unremarkable. Simple, benign right renal cortical cysts. Kidneys are otherwise normal, without renal calculi, solid lesion, or hydronephrosis. Bladder is unremarkable. Stomach/Bowel: Stomach is within normal limits. Appendix appears normal. Unchanged soft tissue thickening involving the low rectum and anus (series 3, image 850). No other evidence of bowel wall thickening, distention, or inflammatory changes. Vascular/Lymphatic: No significant vascular findings are present. No enlarged abdominal or pelvic lymph nodes. Reproductive: No mass or other abnormality. Other: No abdominal wall hernia or abnormality. No ascites. Musculoskeletal: No acute osseous findings. Unchanged superior endplate deformity of L1 (series 6, image 108). IMPRESSION: 1. Unchanged soft tissue thickening involving the low rectum and anus, in keeping with patient's known anal malignancy. 2. No evidence of lymphadenopathy or metastatic disease in the chest, abdomen, or pelvis. 3. Mild pulmonary fibrosis in a pattern with apical to basal  gradient, featuring irregular peripheral interstitial opacity, septal thickening, traction bronchiectasis, and small areas of subpleural bronchiolectasis at the lung bases without clear evidence of honeycombing. Findings are consistent with probable UIP pattern fibrosis by ATS pulmonary fibrosis criteria. Aortic Atherosclerosis (ICD10-I70.0). Electronically  Signed   By: Delanna Ahmadi M.D.   On: 03/15/2022 13:55   CT Head Wo Contrast  Result Date: 03/15/2022 CLINICAL DATA:  New onset seizure. No history of trauma. History of anal carcinoma. EXAM: CT HEAD WITHOUT CONTRAST TECHNIQUE: Contiguous axial images were obtained from the base of the skull through the vertex without intravenous contrast. RADIATION DOSE REDUCTION: This exam was performed according to the departmental dose-optimization program which includes automated exposure control, adjustment of the mA and/or kV according to patient size and/or use of iterative reconstruction technique. COMPARISON:  09/23/2016 head CT FINDINGS: Brain: Global mild brain volume loss. Particularly of the frontal lobes. No midline shift, mass effect. Slight prominence of the ventricular system which is nonspecific although could relate to chronic muscle ischemic changes and central atrophy. Scattered periventricular and deep white matter low attenuation seen which is nonspecific although could relate to chronic small vessel ischemic change. Benign basal ganglia calcifications. Vascular: Scattered vascular calcifications are identified. Skull: Bone windows demonstrate and displaced or depressed skull fracture. No lytic or sclerotic bone lesion. Sinuses/Orbits: Visualized paranasal sinuses and mastoid air cells are clear. Other: No abnormal intra or extra-axial fluid collections are identified. IMPRESSION: Atrophy with likely chronic small-vessel ischemic changes changes. Electronically Signed   By: Jill Side M.D.   On: 03/15/2022 13:55   DG Chest Port 1 View  Result Date: 03/15/2022 CLINICAL DATA:  Weakness. EXAM: PORTABLE CHEST 1 VIEW COMPARISON:  03/12/2022. FINDINGS: Rotated patient. Low lung volumes accentuate the pulmonary vasculature and cardiomediastinal silhouette. No focal airspace opacity. No pleural effusion or pneumothorax. Prior right reverse shoulder arthroplasty. IMPRESSION: Low lung volumes without evidence  of acute cardiopulmonary disease. Electronically Signed   By: Emmit Alexanders M.D.   On: 03/15/2022 13:15    Pertinent labs & imaging results that were available during my care of the patient were reviewed by me and considered in my medical decision making (see MDM for details).  Medications Ordered in ED Medications  acetaminophen (TYLENOL) tablet 1,000 mg (has no administration in time range)  sodium chloride 0.9 % bolus 1,000 mL (1,000 mLs Intravenous New Bag/Given 03/15/22 1238)  iohexol (OMNIPAQUE) 300 MG/ML solution 100 mL (100 mLs Intravenous Contrast Given 03/15/22 1330)                                                                                                                                     Procedures Procedures  (including critical care time)  Medical Decision Making / ED Course   MDM:  89 year old female presenting to the emergency department with abnormal shaking episode.  Unclear cause but per patient this has been going on for some time now.  Laboratory workup overall reassuring without evidence of electrolyte abnormality.  Urine without evidence of infection.  COVID-negative.  CT scan of the head without evidence of acute process.  Very low concern for seizure given patient reports she was conscious during the episode which would not correlate with generalized seizure.  No episodes observed in the emergency department.   Patient was supposed to get CT chest abdomen pelvis for cancer surveillance, I ordered this since she was in the emergency department anyways.  It showed signs of pulmonary fibrosis which on review has been somewhat chronic.  Patient is taking amiodarone, advise she should follow-up with her pulmonologist to determine whether she needs to stop this medication.  She has follow-up closely with her oncologist.     Additional history obtained: -Additional history obtained from spouse -External records from outside source obtained and reviewed  including: Chart review including previous notes, labs, imaging, consultation notes including most recent oncology note   Lab Tests: -I ordered, reviewed, and interpreted labs.   The pertinent results include:   Labs Reviewed  COMPREHENSIVE METABOLIC PANEL - Abnormal; Notable for the following components:      Result Value   Glucose, Bld 119 (*)    All other components within normal limits  CBC WITH DIFFERENTIAL/PLATELET - Abnormal; Notable for the following components:   Abs Immature Granulocytes 0.21 (*)    All other components within normal limits  URINALYSIS, W/ REFLEX TO CULTURE (INFECTION SUSPECTED) - Abnormal; Notable for the following components:   Color, Urine STRAW (*)    Specific Gravity, Urine 1.004 (*)    Hgb urine dipstick SMALL (*)    Bacteria, UA RARE (*)    All other components within normal limits  RESP PANEL BY RT-PCR (RSV, FLU A&B, COVID)  [id number]    Notable for normal electrolytes    Imaging Studies ordered: I ordered imaging studies including CT head, CT C/A/P On my interpretation imaging demonstrates unchanged anal thickening, e/o pulmonary fibrosis I independently visualized and interpreted imaging. I agree with the radiologist interpretation   Medicines ordered and prescription drug management: Meds ordered this encounter  Medications   sodium chloride 0.9 % bolus 1,000 mL   iohexol (OMNIPAQUE) 300 MG/ML solution 100 mL   acetaminophen (TYLENOL) tablet 1,000 mg    -I have reviewed the patients home medicines and have made adjustments as needed  Reevaluation: After the interventions noted above, I reevaluated the patient and found that they have resolved  Co morbidities that complicate the patient evaluation  Past Medical History:  Diagnosis Date   A-fib ([place])    Abnormal heart rhythm    Allergic rhinitis    anal ca 01/2018   with mets to left inguinal ln 01/2018   Anxiety    Asthma    Diabetes mellitus without complication (HCC)    Type  II   GERD (gastroesophageal reflux disease)    Headache    History of radiation therapy 07/05/2018-07/18/2018   left pelvis        Dr Lorraine Gilbert   History of radiation therapy    Anus- 03/30/21-05/07/21- Dr. Gery Gilbert   Hypertension    Neuromuscular disorder [facility])    Siactic pain in right leg   Osteopenia    Rheumatoid arthritis ([place])    Rotator cuff tear arthropathy    Right   Seizures ([place])    Vertigo       Dispostion: Disposition decision including need for hospitalization was considered, and patient discharged from emergency  department.    Final Clinical Impression(s) / ED Diagnoses Final diagnoses:  Occasional tremors     This chart was dictated using voice recognition software.  Despite best efforts to proofread,  errors can occur which can change the documentation meaning.    Cristie Hem, MD 03/15/22 [phone]

## 2022-03-16 ENCOUNTER — Other Ambulatory Visit: Payer: Self-pay

## 2022-03-16 ENCOUNTER — Encounter: Payer: Self-pay | Admitting: Neurology

## 2022-03-16 ENCOUNTER — Ambulatory Visit: Payer: Medicare Other | Admitting: Neurology

## 2022-03-16 ENCOUNTER — Ambulatory Visit (INDEPENDENT_AMBULATORY_CARE_PROVIDER_SITE_OTHER): Payer: Medicare Other | Admitting: Neurology

## 2022-03-16 VITALS — BP 156/80 | HR 100 | Ht 63.0 in | Wt 131.2 lb

## 2022-03-16 DIAGNOSIS — Z7902 Long term (current) use of antithrombotics/antiplatelets: Secondary | ICD-10-CM | POA: Diagnosis not present

## 2022-03-16 DIAGNOSIS — M069 Rheumatoid arthritis, unspecified: Secondary | ICD-10-CM | POA: Diagnosis not present

## 2022-03-16 DIAGNOSIS — I872 Venous insufficiency (chronic) (peripheral): Secondary | ICD-10-CM | POA: Diagnosis not present

## 2022-03-16 DIAGNOSIS — R569 Unspecified convulsions: Secondary | ICD-10-CM

## 2022-03-16 DIAGNOSIS — Z8673 Personal history of transient ischemic attack (TIA), and cerebral infarction without residual deficits: Secondary | ICD-10-CM | POA: Diagnosis not present

## 2022-03-16 DIAGNOSIS — R9089 Other abnormal findings on diagnostic imaging of central nervous system: Secondary | ICD-10-CM

## 2022-03-16 DIAGNOSIS — K219 Gastro-esophageal reflux disease without esophagitis: Secondary | ICD-10-CM | POA: Diagnosis not present

## 2022-03-16 DIAGNOSIS — J45909 Unspecified asthma, uncomplicated: Secondary | ICD-10-CM | POA: Diagnosis not present

## 2022-03-16 DIAGNOSIS — I10 Essential (primary) hypertension: Secondary | ICD-10-CM | POA: Diagnosis not present

## 2022-03-16 DIAGNOSIS — R252 Cramp and spasm: Secondary | ICD-10-CM

## 2022-03-16 DIAGNOSIS — E1151 Type 2 diabetes mellitus with diabetic peripheral angiopathy without gangrene: Secondary | ICD-10-CM | POA: Diagnosis not present

## 2022-03-16 DIAGNOSIS — F32A Depression, unspecified: Secondary | ICD-10-CM | POA: Diagnosis not present

## 2022-03-16 DIAGNOSIS — N390 Urinary tract infection, site not specified: Secondary | ICD-10-CM | POA: Diagnosis not present

## 2022-03-16 DIAGNOSIS — E039 Hypothyroidism, unspecified: Secondary | ICD-10-CM | POA: Diagnosis not present

## 2022-03-16 DIAGNOSIS — Z85048 Personal history of other malignant neoplasm of rectum, rectosigmoid junction, and anus: Secondary | ICD-10-CM | POA: Diagnosis not present

## 2022-03-16 DIAGNOSIS — I4891 Unspecified atrial fibrillation: Secondary | ICD-10-CM | POA: Diagnosis not present

## 2022-03-16 DIAGNOSIS — E785 Hyperlipidemia, unspecified: Secondary | ICD-10-CM | POA: Diagnosis not present

## 2022-03-16 DIAGNOSIS — H4010X0 Unspecified open-angle glaucoma, stage unspecified: Secondary | ICD-10-CM | POA: Diagnosis not present

## 2022-03-16 DIAGNOSIS — I70213 Atherosclerosis of native arteries of extremities with intermittent claudication, bilateral legs: Secondary | ICD-10-CM | POA: Diagnosis not present

## 2022-03-16 NOTE — Progress Notes (Signed)
NEUROLOGY FOLLOW UP OFFICE NOTE  Toia Chatelain FM:[id number] 05-09-1932  HISTORY OF PRESENT ILLNESS: I had the pleasure of seeing Lorraine Gilbert in follow-up in the neurology clinic on 03/16/2022.  The patient was last seen on in November 2022 for memory loss, vertigo, and neuropathy. She presents today for a new symptom of new onset seizure that occurred on 02/13/22. She is accompanied by her son who helps supplement the history today.  Records and images were personally reviewed where available. She was admitted to [facility] from January 26 to February 22, 2022. Her other son found her tensed up and drooling. When she arrived to the ER, she was unresponsive with left gaze and convulsions for 7-10 minutes. She was given %mg IV Versed. Head CT/CTA no acute changes. She had another episode in the ER with rhythmic left arm shaking, head turned to the right, gaze to the left while still talking to the ER physician. Episode resolved with Ativan. She was started on Levetiracetam. Long-term EEG monitoring done showed lateralized periodic discharges with right frontal maximum, independent sharp waves, multifocal sharp waves along with mild background slowing. Brain MRI reported restricted diffusion and slightly expansile T2?FLAIR hyperintense signal in the anterior medical right temporal lobe and dorsal medial right thalamus, likely related to recent seizure activity, although acute right thalamic infarct or early changes of herpes encephalitis cannot be entirely excluded. She was started on Acyclovis. Mental status improved while awaiting washout period for Plavix, so lumbar puncture was deferred. She was discharged home on Levetiracetam '500mg'$  BID. Due to concern for new stroke, she had a stroke workup as well with TTE showing an EF of 50-60%, moderate to severe AR, HbA1c 7.8, LDL 124. CTA showed severe intracranial atherosclerosis and remote right cerebellar lacune. She states that on hospital discharge, she  stopped both the Plavix and Eliquis. Although notes indicate to continue Plavix and consider anticoagulation for atrial fibrillation as she was previously on Eliquis, increase Crestor as tolerated.   Family denies any further similar seizures. Since hospital discharge though, she is always dizzy and sometimes cannot remember things. She has been having episodes where she would vocalize as if taking a deep breath/hiccup then have upper body jerks. These would only last no more than 3 seconds, there is no pain, she calls it an involuntary muscle spasm, but they can occur several times an hour, sometimes waking her up. She had 2 in the office today. There is no loss of consciousness/unresponsiveness. Her other son on speakerphone reported she was having periods where she would scream with eyes closed and then say she is in pain. These have quieted down. She would be exercising and say she has pain in her legs. She states pain is a lot better, she is not in so much pain when moving around. She notes drowsiness most of the time, her son feels she is bored. She still has light headaches, these have worsened since hospital discharge. Family denies any staring/unresponsive episodes. She reports a lump on the left occipital area, sometime it is swollen then would resolve. She denies any abdominal pain.    PAST MEDICAL HISTORY: Past Medical History:  Diagnosis Date   A-fib ([place])    Abnormal heart rhythm    Allergic rhinitis    anal ca 01/2018   with mets to left inguinal ln 01/2018   Anxiety    Asthma    Diabetes mellitus without complication (HCC)    Type II   GERD (gastroesophageal reflux disease)  Headache    History of radiation therapy 07/05/2018-07/18/2018   left pelvis        Dr Gery Pray   History of radiation therapy    Anus- 03/30/21-05/07/21- Dr. Gery Pray   Hypertension    Neuromuscular disorder [facility])    Siactic pain in right leg   Osteopenia    Rheumatoid arthritis ([place])     Rotator cuff tear arthropathy    Right   Seizures ([place])    Vertigo     MEDICATIONS: Current Outpatient Medications on File Prior to Visit  Medication Sig Dispense Refill   ACCU-CHEK AVIVA PLUS test strip 1 each as directed.     Accu-Chek Softclix Lancets lancets 1 each by Other route as directed.     ACETAMINOPHEN 8 HOUR PO Take 1 tablet by mouth as needed.     albuterol (PROVENTIL) (2.5 MG/3ML) 0.083% nebulizer solution SMARTSIG:1 Vial(s) Via Nebulizer Every 4-6 Hours PRN     Alcohol Swabs (ALCOHOL WIPES) 70 % PADS DropSafe Alcohol Prep Pads     amiodarone (PACERONE) 100 MG tablet Take 100 mg by mouth daily.     Blood Glucose Calibration (ACCU-CHEK AVIVA) SOLN      Blood Glucose Monitoring Suppl (TRUE METRIX AIR GLUCOSE METER) w/Device KIT See admin instructions.     escitalopram (LEXAPRO) 10 MG tablet Take 10 mg by mouth daily.     levalbuterol (XOPENEX) 1.25 MG/3ML nebulizer solution Inhale into the lungs.     levETIRAcetam (KEPPRA) 500 MG tablet Take 500 mg by mouth 2 (two) times daily.     mirtazapine (REMERON) 7.5 MG tablet Take 1 tablet (7.5 mg total) by mouth at bedtime. 30 tablet 0   polyethylene glycol powder (GLYCOLAX/MIRALAX) 17 GM/SCOOP powder Take by mouth.     rosuvastatin (CRESTOR) 5 MG tablet Take by mouth.     TRELEGY ELLIPTA 200-62.5-25 MCG/ACT AEPB Take 1 puff by mouth daily.     Vitamin D, Ergocalciferol, 50000 units CAPS 1 capsule     clopidogrel (PLAVIX) 75 MG tablet Take 75 mg by mouth daily. (Patient not taking: Reported on 03/16/2022)     ELIQUIS 2.5 MG TABS tablet Take 2.5 mg by mouth 2 (two) times daily. (Patient not taking: Reported on [id number])     folic acid (FOLVITE) 1 MG tablet Take by mouth. (Patient not taking: Reported on 03/16/2022)     hydrocortisone (ANUSOL-HC) 25 MG suppository Place 1 suppository (25 mg total) rectally 2 (two) times daily. (Patient not taking: Reported on 11/12/2021) 12 suppository 0   megestrol (MEGACE) 400 MG/10ML suspension Take  10 mLs (400 mg total) by mouth daily. (Patient not taking: Reported on 03/16/2022) 480 mL 1   nitroGLYCERIN (NITROSTAT) 0.4 MG SL tablet Place 0.4 mg under the tongue every 5 (five) minutes as needed for chest pain. (Patient not taking: Reported on 03/30/2021)     ondansetron (ZOFRAN) 4 MG tablet Take 1 tablet (4 mg total) by mouth every 8 (eight) hours as needed for nausea or vomiting. (Patient not taking: Reported on 03/30/2021) 20 tablet 1   No current facility-administered medications on file prior to visit.    ALLERGIES: Allergies  Allergen Reactions   Ibuprofen Other (See Comments)    Confusion, Patient states that the '800mg'$  Motrin made her feel like she was flying.    FAMILY HISTORY: Family History  Problem Relation Age of Onset   Emphysema Father        smoked   Heart disease Mother    Heart  attack Son    Breast cancer Neg Hx     SOCIAL HISTORY: Social History   Socioeconomic History   Marital status: Married    Spouse name: Pascual   Number of children: 6   Years of education: Not on file   Highest education level: Not on file  Occupational History   Occupation: Retired Event organiser  Tobacco Use   Smoking status: Never   Smokeless tobacco: Never  Vaping Use   Vaping Use: Never used  Substance and Sexual Activity   Alcohol use: No    Alcohol/week: 0.0 standard drinks of alcohol   Drug use: No   Sexual activity: Not on file  Other Topics Concern   Not on file  Social History Narrative   Right Handed   Lives with husband and son   One Story Home   Caffeine, Yes drinks coffee    Social Determinants of Health   Financial Resource Strain: Not on file  Food Insecurity: Not on file  Transportation Needs: Not on file  Physical Activity: Not on file  Stress: Not on file  Social Connections: Not on file  Intimate Partner Violence: Not on file     PHYSICAL EXAM: Vitals:   03/16/22 1257  BP: (!) 156/80  Pulse: 100  SpO2: 99%   General: No acute  distress Head:  Normocephalic/atraumatic Skin/Extremities: No rash, no edema Neurological Exam: alert and awake. No aphasia or dysarthria. Fund of knowledge is appropriate.  Attention and concentration are normal.   Cranial nerves: Pupils equal, round. Extraocular movements intact with no nystagmus. Visual fields full.  No facial asymmetry.  Motor: Bulk and tone normal, muscle strength 5/5 throughout with no pronator drift.   Finger to nose testing intact.  Gait slow and cautious. No tremors. She had one episode with nursing staff where she vocalized quickly and had upper body shaking with no loss of consciousness. She one at the beginning of the visit where she vocalized with upper body jerking back, no loss of consciousness.   IMPRESSION: This is a pleasant 89yo with hypertension, hyperlipidemia, diabetes, atrial fibrillation on Eliquis, PMR, mild dementia, neuropathy, seen today for new symptoms of new onset seizures that started 02/13/2022 with EEG showing lateralized eriodic discharges with right frontal maximum, independent sharp waves, multifocal sharp waves along with mild background slowing. MRI showed changes in the anterior medical right temporal lobe and dorsal medial right thalamus, likely related to recent seizure activity, although acute right thalamic infarct or early changes of herpes encephalitis cannot be entirely excluded. Follow-up brain MRI with and without contrast will be ordered to assess for interval change. No typical seizures since hospital discharge, however she has had episodes where she vocalizes with upper body jerking (no loss of consciousness), with 2 witnessed in the office today. Stat EEG will be done today to evaluate these new changes. Continue Levetiracetam '500mg'$  BID for now, further recommendations after EEG is done. Patient advised to restart Eliquis 2.'5mg'$  BID for now. Follow-up in 2 months, call for any changes.    Thank you for allowing me to participate in her  care.  Please do not hesitate to call for any questions or concerns.    Ellouise Newer, M.D.   CC: Dr. Moreen Fowler

## 2022-03-16 NOTE — Progress Notes (Unsigned)
EEG complete - results pending 

## 2022-03-16 NOTE — Progress Notes (Unsigned)
[facility]   [phone] Fax:([phone]) [phone]   Clinic Follow up Note   Patient Care Team: [name], MD as PCP - General (Family Medicine) Cameron Sprang, MD as Consulting Physician (Neurology) Truitt Merle, MD as Consulting Physician (Oncology) Royston Bake, RN as Oncology Nurse Navigator (Oncology)  Date of Service:  03/18/2022  CHIEF COMPLAINT: f/u of  anal cancer   CURRENT THERAPY:  Surveillance   ASSESSMENT:  Lorraine Gilbert is a 89 y.o. female with   Metastatic squamous cell carcinoma involving lymph node with unknown primary site [facility]) -cT1N0M0  -she had metastatic squamous cell carcinoma to left groin lymph nodes in February 2020, PET scan was negative for primary tumor, she underwent definitive radiation. -She was found to have anal cancer in 02/2021 -Status post concurrent chemoradiation, completed in April 2023. -Posttreatment PET scan in July 2023 showed treatment response, no evidence of metastasis. -Surveillance CT scan from March 15, 2022 showed unchanged soft tissue thickening involving the low rectum and anus, no evidence of recurrent disease.  I discussed the findings with patient. -She was recently hospitalized for altered mental status secondary to virus infection, brain MRI was negative, she has recovered     PLAN: -lab reviewed -Discuss CT scan -no evidence of recurrent disease. -Continue Surveillance - lab and f/u in 4 months  SUMMARY OF ONCOLOGIC HISTORY: Oncology History Overview Note   Cancer Staging  Anal cancer ([place]) Staging form: Anus, AJCC V9 - Clinical stage from 01/30/2021: Stage I (cT1, cN0, cM0) - Signed by Truitt Merle, MD on 03/11/2021 Stage prefix: Initial diagnosis Histologic grade (G): G2 Histologic grading system: 4 grade system     Metastatic squamous cell carcinoma involving lymph node with unknown primary site ([name])  02/06/2018 Imaging   Impression CT abdomen and pelvis 1.  Enlarged left groin  lymph node which is of uncertain etiology. This would be amenable to percutaneous sampling if deemed clinically appropriate. 2.  Hepatic steatosis. Focal area of low-attenuation in the far lateral left hepatic lobe, not clearly seen on the prior exam. Consider liver ultrasound for further evaluation.  3.  Mild L1 compression deformity which is new.   02/28/2018 Pathology Results   Left groin lymph node, needle core biopsy:       -Metastatic squamous cell carcinoma, see comment  P16 stain is strongly positive, suggestive of cervical or anogenital origin in this location. Immunohistochemical stains for CK5, p63, and pan cytokeratin are positive. ER is weakly positive. CK7 and CK20 are negative.   03/15/2018 Pathology Results   1. Cervix, biopsy, 12 o'clock - BENIGN SQUAMOUS MUCOSA WITH INFLAMMATION. - NO DYSPLASIA OR MALIGNANCY. 2. Endocervix, curettage - BENIGN SQUAMOUS EPITHELIUM.   03/23/2018 PET scan   1. Isolated intensely hypermetabolic enlarged LEFT inguinal lymph node. No clear primary identified. 2. No abnormal activity associated with the vagina or anorectal tissue. 3. No hypermetabolic lymph nodes in the pelvis. 4. Two adjacent nodules in the RIGHT upper lobe with very low metabolic activity. These are not favored primary malignancies. Recommend follow-up per Fleischner criteria. Non-contrast chest CT at 6-12 months is recommended.    07/05/2018 - 07/18/2018 Radiation Therapy   Left inguinal area/nodes treated to 30 Gy in 10 fractions   09/22/2020 Imaging   EXAM: CT ABDOMEN PELVIS W IV CONTRAST   IMPRESSION:  1. Multicystic enhancing right adnexal lesion with mild surrounding stranding. Given short interval since recent CT 3 weeks prior, this is more suspicious for acute findings such as torsion  or infection, less likely neoplasm. Recommend endovaginal ultrasound for further evaluation and follow-up to resolution.  2. Other stable chronic findings.    01/30/2021 Pathology Results    DIAGNOSIS   SMALL  TUFT OF TISSUE ABOVE ANAL VERGE, ENDOSCOPIC BIOPSIES:   - HIGH-GRADE SQUAMOUS INTRAEPITHELIAL LESION/ANAL INTRAEPITHELIAL NEOPLASIA 2-3 OUT OF 3 (HGSIL/AIN 2-3).   - SEE COMMENT.  ([id number])   COMMENT  MORPHOLOGICALLY THERE IS AT LEAST TWO THIRDS THICKNESS INVOLVEMENT BY DYSPLASTIC EPITHELIUM WITH MID-LEVEL MITOSES SECONDARY HOWEVER DUE TO POOR ORIENTATION AND MORPHOLOGIC ASSESSMENT IS 2 SERVICE MUCOSA IS ONLY PRESENT IN FOCAL AREAS MORPHOLOGICALLY THIS REPRESENTS AT LEAST AIN 2 AND MOST LIKELY AIN 3.  IN EITHER RESPECT THE FINDINGS ARE CONSISTENT WITH HIGH-GRADE SQUAMOUS INTRAEPITHELIAL LESION WHICH IS ALSO CONFIRMED BY STRONGLY BLOCK LIKE P16 IMMUNOHISTOCHEMICAL  POSITIVITY.    03/05/2021 Pathology Results   Final Diagnosis    Rectal mass, biopsy: -Invasive squamous cell carcinoma, moderately differentiated   Addendum 1    The carcinoma was analyzed by [facility] for DNA mismatch repair proteins.  The neoplasm retained nuclear expression of all four mismatch repair proteins, MLH1, PMS2, MSH2, MSH6.  Controls worked appropriately.     Anal cancer ([place])  01/30/2021 Cancer Staging   Staging form: Anus, AJCC V9 - Clinical stage from 01/30/2021: Stage I (cT1, cN0, cM0) - Signed by Truitt Merle, MD on 03/11/2021 Stage prefix: Initial diagnosis Histologic grade (G): G2 Histologic grading system: 4 grade system   03/11/2021 Initial Diagnosis   Anal cancer ([name])   03/30/2021 - 05/01/2021 Chemotherapy   Patient is on Treatment Plan : ANUS Mitomycin D1,28 / 5FU D1-4, 28-31 q32d     03/15/2022 Imaging    IMPRESSION: 1. Unchanged soft tissue thickening involving the low rectum and anus, in keeping with patient's known anal malignancy. 2. No evidence of lymphadenopathy or metastatic disease in the chest, abdomen, or pelvis. 3. Mild pulmonary fibrosis in a pattern with apical to basal gradient, featuring irregular peripheral interstitial opacity, septal thickening, traction  bronchiectasis, and small areas of subpleural bronchiolectasis at the lung bases without clear evidence of honeycombing. Findings are consistent with probable UIP pattern fibrosis by ATS pulmonary fibrosis criteria.        INTERVAL HISTORY:  Lorraine Gilbert is here for a follow up of  anal cancer  She was last seen by me on 11/17/2022 She presents to the clinic accompanied by son. Pt was recently in the  ED on 03/15/2022 due to tremors.  Pt stated she has been having dizziness since she left the hospital.Pt state she had a normal BM. Pt denied having any bleeding or pain.       All other systems were reviewed with the patient and are negative.  MEDICAL HISTORY:  Past Medical History:  Diagnosis Date   A-fib ([place])    Abnormal heart rhythm    Allergic rhinitis    anal ca 01/2018   with mets to left inguinal ln 01/2018   Anxiety    Asthma    Diabetes mellitus without complication (HCC)    Type II   GERD (gastroesophageal reflux disease)    Headache    History of radiation therapy 07/05/2018-07/18/2018   left pelvis        Dr Gery Pray   History of radiation therapy    Anus- 03/30/21-05/07/21- Dr. Gery Pray   Hypertension    Neuromuscular disorder [facility])    Siactic pain in right leg   Osteopenia    Rheumatoid arthritis ([place])  Rotator cuff tear arthropathy    Right   Seizures ([place])    Vertigo     SURGICAL HISTORY: Past Surgical History:  Procedure Laterality Date   BIOPSY  12/13/2018   Procedure: BIOPSY;  Surgeon: Wonda Horner, MD;  Location: [facility] ENDOSCOPY;  Service: Endoscopy;;   CATARACT EXTRACTION     bilateral   COLONOSCOPY WITH PROPOFOL N/A 12/13/2018   Procedure: COLONOSCOPY WITH PROPOFOL;  Surgeon: Wonda Horner, MD;  Location: [facility] ENDOSCOPY;  Service: Endoscopy;  Laterality: N/A;   ESOPHAGOGASTRODUODENOSCOPY N/A 12/13/2018   Procedure: ESOPHAGOGASTRODUODENOSCOPY (EGD);  Surgeon: Wonda Horner, MD;  Location: [facility];  Service: Endoscopy;   Laterality: N/A;   EYE SURGERY     POLYPECTOMY  12/13/2018   Procedure: POLYPECTOMY;  Surgeon: Wonda Horner, MD;  Location: [facility] ENDOSCOPY;  Service: Endoscopy;;   REVERSE SHOULDER ARTHROPLASTY Right 08/04/2017   REVERSE SHOULDER ARTHROPLASTY Right 08/04/2017   Procedure: RIGHT REVERSE SHOULDER ARTHROPLASTY;  Surgeon: Justice Britain, MD;  Location: [place];  Service: Orthopedics;  Laterality: Right;   TUBAL LIGATION      I have reviewed the social history and family history with the patient and they are unchanged from previous note.  ALLERGIES:  is allergic to ibuprofen.  MEDICATIONS:  Current Outpatient Medications  Medication Sig Dispense Refill   ACCU-CHEK AVIVA PLUS test strip 1 each as directed.     Accu-Chek Softclix Lancets lancets 1 each by Other route as directed.     ACETAMINOPHEN 8 HOUR PO Take 1 tablet by mouth as needed.     albuterol (PROVENTIL) (2.5 MG/3ML) 0.083% nebulizer solution SMARTSIG:1 Vial(s) Via Nebulizer Every 4-6 Hours PRN     Alcohol Swabs (ALCOHOL WIPES) 70 % PADS DropSafe Alcohol Prep Pads     amiodarone (PACERONE) 100 MG tablet Take 100 mg by mouth daily.     Blood Glucose Calibration (ACCU-CHEK AVIVA) SOLN      Blood Glucose Monitoring Suppl (TRUE METRIX AIR GLUCOSE METER) w/Device KIT See admin instructions.     clopidogrel (PLAVIX) 75 MG tablet Take 75 mg by mouth daily. (Patient not taking: Reported on 03/16/2022)     ELIQUIS 2.5 MG TABS tablet Take 2.5 mg by mouth 2 (two) times daily. (Patient not taking: Reported on 03/16/2022)     escitalopram (LEXAPRO) 10 MG tablet Take 10 mg by mouth daily.     levalbuterol (XOPENEX) 1.25 MG/3ML nebulizer solution Inhale into the lungs.     levETIRAcetam (KEPPRA) 500 MG tablet Take 1 tablet (500 mg total) by mouth 2 (two) times daily. 180 tablet 3   mirtazapine (REMERON) 7.5 MG tablet Take 1 tablet (7.5 mg total) by mouth at bedtime. 30 tablet 0   nitroGLYCERIN (NITROSTAT) 0.4 MG SL tablet Place 0.4 mg under the tongue  every 5 (five) minutes as needed for chest pain. (Patient not taking: Reported on 03/30/2021)     polyethylene glycol powder (GLYCOLAX/MIRALAX) 17 GM/SCOOP powder Take by mouth.     rosuvastatin (CRESTOR) 5 MG tablet Take by mouth.     TRELEGY ELLIPTA 200-62.5-25 MCG/ACT AEPB Take 1 puff by mouth daily.     Vitamin D, Ergocalciferol, 50000 units CAPS 1 capsule     No current facility-administered medications for this visit.    PHYSICAL EXAMINATION: ECOG PERFORMANCE STATUS: 2 - Symptomatic, <50% confined to bed  Vitals:   03/18/22 1103  BP: (!) 171/83  Pulse: 83  Resp: 16  Temp: (!) 97.5 F (36.4 C)  SpO2: 100%  Wt Readings from Last 3 Encounters:  03/18/22 131 lb 1.6 oz (59.5 kg)  03/16/22 131 lb 3.2 oz (59.5 kg)  11/16/21 131 lb 14.4 oz (59.8 kg)     GENERAL:alert, no distress and comfortable SKIN: skin color normal, no rashes or significant lesions EYES: normal, Conjunctiva are pink and non-injected, sclera clear  NEURO: alert & oriented x 3 with fluent speech   LABORATORY DATA:  I have reviewed the data as listed    Latest Ref Rng & Units 03/18/2022   10:20 AM 03/15/2022   12:40 PM 11/16/2021    1:19 PM  CBC  WBC 4.0 - 10.5 K/uL 6.3  6.7  5.4   Hemoglobin 12.0 - 15.0 g/dL 13.2  13.1  12.6   Hematocrit 36.0 - 46.0 % 39.6  39.9  37.9   Platelets 150 - 400 K/uL 217  200  239         Latest Ref Rng & Units 03/18/2022   10:20 AM 03/15/2022   12:40 PM 11/16/2021    1:19 PM  CMP  Glucose 70 - 99 mg/dL 170  119  96   BUN 8 - 23 mg/dL '17  16  20   '$ Creatinine 0.44 - 1.00 mg/dL 0.83  0.84  1.03   Sodium 135 - 145 mmol/L 141  135  140   Potassium 3.5 - 5.1 mmol/L 4.6  3.8  4.9   Chloride 98 - 111 mmol/L 108  103  111   CO2 22 - 32 mmol/L '26  23  23   '$ Calcium 8.9 - 10.3 mg/dL 9.1  9.0  9.3   Total Protein 6.5 - 8.1 g/dL 6.7  7.6  7.3   Total Bilirubin 0.3 - 1.2 mg/dL 0.4  0.8  0.5   Alkaline Phos 38 - 126 U/L 59  50  46   AST 15 - 41 U/L '18  25  16   '$ ALT 0 - 44 U/L  '18  20  14       '$ RADIOGRAPHIC STUDIES: I have personally reviewed the radiological images as listed and agreed with the findings in the report. No results found.    No orders of the defined types were placed in this encounter.  All questions were answered. The patient knows to call the clinic with any problems, questions or concerns. No barriers to learning was detected. The total time spent in the appointment was 30 minutes.     Truitt Merle, MD 03/18/2022   Felicity Coyer, CMA, am acting as scribe for Truitt Merle, MD.   I have reviewed the above documentation for accuracy and completeness, and I agree with the above.

## 2022-03-16 NOTE — Patient Instructions (Signed)
Good to see you.  Do 1-hour EEG today. Depending on results, we will plan on Keppra dose  2. Schedule MRI brain with and without contrast  3. Restart Eliquis 2.'5mg'$  twice a day  4. Follow-up in 2 months, call for any changes   Seizure Precautions: 1. If medication has been prescribed for you to prevent seizures, take it exactly as directed.  Do not stop taking the medicine without talking to your doctor first, even if you have not had a seizure in a long time.   2. Avoid activities in which a seizure would cause danger to yourself or to others.  Don't operate dangerous machinery, swim alone, or climb in high or dangerous places, such as on ladders, roofs, or girders.  Do not drive unless your doctor says you may.  3. If you have any warning that you may have a seizure, lay down in a safe place where you can't hurt yourself.    4.  No driving for 6 months from last seizure, as per [facility].   Please refer to the following link on the [place] website for more information: http://[email or web address].org/[email or web address]/drivingu.cfm   5.  Maintain good sleep hygiene.  6.  Contact your doctor if you have any problems that may be related to the medicine you are taking.  7.  Call 911 and bring the patient back to the ED if:        A.  The seizure lasts longer than 5 minutes.       B.  The patient doesn't awaken shortly after the seizure  C.  The patient has new problems such as difficulty seeing, speaking or moving  D.  The patient was injured during the seizure  E.  The patient has a temperature over 102 F (39C)  F.  The patient vomited and now is having trouble breathing

## 2022-03-17 ENCOUNTER — Other Ambulatory Visit: Payer: Self-pay

## 2022-03-17 DIAGNOSIS — C210 Malignant neoplasm of anus, unspecified: Secondary | ICD-10-CM

## 2022-03-17 DIAGNOSIS — C779 Secondary and unspecified malignant neoplasm of lymph node, unspecified: Secondary | ICD-10-CM

## 2022-03-17 MED ORDER — LEVETIRACETAM 500 MG PO TABS: 500.0000 mg | ORAL_TABLET | Freq: Two times a day (BID) | ORAL | 3 refills | Status: DC

## 2022-03-17 NOTE — Procedures (Signed)
ELECTROENCEPHALOGRAM REPORT  Date of Study: 03/15/2022  Patient's Name: Lorraine Gilbert MRN: [id number] Date of Birth: 15-Nov-1932  Referring Provider: Dr. Ellouise Newer  Clinical History: This is an 89 year old woman with new onset seizure with EEG showing lateralized periodic discharges with a right frontal maximum, Independent sharp waves multifocal sharp waves. Patient has been having episodes of vocalization with upper body jerking since then, no loss of consciousness. Patient had episodes in the clinic. Stat EEG for classification.  Seizure Medications: Keppra  Technical Summary: A multichannel digital 1-hour EEG recording measured by the international 10-20 system with electrodes applied with paste and impedances below 5000 ohms performed in our laboratory with EKG monitoring in an awake and drowsy patient.  Hyperventilation was not performed. Photic stimulation was performed.  The digital EEG was referentially recorded, reformatted, and digitally filtered in a variety of bipolar and referential montages for optimal display.    Description: The patient is awake and drowsy during the recording.  During maximal wakefulness, there is a symmetric, medium voltage 8-8.5 Hz posterior dominant rhythm that attenuates with eye opening.  The record is symmetric.  During drowsiness and sleep, there is an increase in theta slowing over the bilateral temporal region. Sleep was not captured. Photic stimulation did not elicit any abnormalities.  There were no epileptiform discharges or electrographic seizures seen.    EKG lead was unremarkable.  Impression: This 1-hour awake and asleep EEG is normal.    Clinical Correlation: A normal EEG does not exclude a clinical diagnosis of epilepsy. Typical episodes were not captured. If further clinical questions remain, prolonged EEG may be helpful.  Clinical correlation is advised.   Ellouise Newer, M.D.

## 2022-03-17 NOTE — Assessment & Plan Note (Signed)
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 -She was found to have anal cancer in 02/2021 -Status post concurrent chemoradiation, completed in April 2023. -Posttreatment PET scan in July 2023 showed treatment response, no evidence of metastasis. -Surveillance CT scan from March 15, 2022 showed unchanged soft tissue thickening involving the low rectum and anus, no evidence of recurrent disease.

## 2022-03-18 ENCOUNTER — Inpatient Hospital Stay (HOSPITAL_BASED_OUTPATIENT_CLINIC_OR_DEPARTMENT_OTHER): Payer: Medicare Other | Admitting: Hematology

## 2022-03-18 ENCOUNTER — Inpatient Hospital Stay: Payer: Medicare Other | Attending: Hematology

## 2022-03-18 ENCOUNTER — Encounter: Payer: Self-pay | Admitting: Hematology

## 2022-03-18 ENCOUNTER — Other Ambulatory Visit: Payer: Self-pay

## 2022-03-18 VITALS — BP 171/83 | HR 83 | Temp 97.5°F | Resp 16 | Ht 63.0 in | Wt 131.1 lb

## 2022-03-18 DIAGNOSIS — C210 Malignant neoplasm of anus, unspecified: Secondary | ICD-10-CM

## 2022-03-18 DIAGNOSIS — Z85048 Personal history of other malignant neoplasm of rectum, rectosigmoid junction, and anus: Secondary | ICD-10-CM | POA: Diagnosis not present

## 2022-03-18 DIAGNOSIS — C801 Malignant (primary) neoplasm, unspecified: Secondary | ICD-10-CM

## 2022-03-18 DIAGNOSIS — Z923 Personal history of irradiation: Secondary | ICD-10-CM | POA: Insufficient documentation

## 2022-03-18 DIAGNOSIS — C779 Secondary and unspecified malignant neoplasm of lymph node, unspecified: Secondary | ICD-10-CM

## 2022-03-18 DIAGNOSIS — Z9221 Personal history of antineoplastic chemotherapy: Secondary | ICD-10-CM | POA: Diagnosis not present

## 2022-03-18 LAB — CMP (CANCER CENTER ONLY)
ALT: 18 U/L (ref 0–44)
AST: 18 U/L (ref 15–41)
Albumin: 4.1 g/dL (ref 3.5–5.0)
Alkaline Phosphatase: 59 U/L (ref 38–126)
Anion gap: 7 (ref 5–15)
BUN: 17 mg/dL (ref 8–23)
CO2: 26 mmol/L (ref 22–32)
Calcium: 9.1 mg/dL (ref 8.9–10.3)
Chloride: 108 mmol/L (ref 98–111)
Creatinine: 0.83 mg/dL (ref 0.44–1.00)
GFR, Estimated: >60 mL/min (ref >60)
Glucose, Bld: 170 mg/dL — ABNORMAL HIGH (ref 70–99)
Potassium: 4.6 mmol/L (ref 3.5–5.1)
Sodium: 141 mmol/L (ref 135–145)
Total Bilirubin: 0.4 mg/dL (ref 0.3–1.2)
Total Protein: 6.7 g/dL (ref 6.5–8.1)

## 2022-03-18 LAB — CBC WITH DIFFERENTIAL (CANCER CENTER ONLY)
Abs Immature Granulocytes: 0.21 10*3/uL — ABNORMAL HIGH (ref 0.00–0.07)
Basophils Absolute: 0 10*3/uL (ref 0.0–0.1)
Basophils Relative: 0 %
Eosinophils Absolute: 0.1 10*3/uL (ref 0.0–0.5)
Eosinophils Relative: 2 %
HCT: 39.6 % (ref 36.0–46.0)
Hemoglobin: 13.2 g/dL (ref 12.0–15.0)
Immature Granulocytes: 3 %
Lymphocytes Relative: 22 %
Lymphs Abs: 1.4 10*3/uL (ref 0.7–4.0)
MCH: 32.4 pg (ref 26.0–34.0)
MCHC: 33.3 g/dL (ref 30.0–36.0)
MCV: 97.3 fL (ref 80.0–100.0)
Monocytes Absolute: 0.6 10*3/uL (ref 0.1–1.0)
Monocytes Relative: 9 %
Neutro Abs: 4 10*3/uL (ref 1.7–7.7)
Neutrophils Relative %: 64 %
Platelet Count: 217 10*3/uL (ref 150–400)
RBC: 4.07 MIL/uL (ref 3.87–5.11)
RDW: 14.6 % (ref 11.5–15.5)
WBC Count: 6.3 10*3/uL (ref 4.0–10.5)
nRBC: 0 % (ref 0.0–0.2)

## 2022-03-19 ENCOUNTER — Telehealth: Payer: Self-pay | Admitting: Neurology

## 2022-03-19 NOTE — Telephone Encounter (Signed)
Left message with the after hour service on 03-19-22 at 12:50 pm  Caller wants to see if he can get the resutls from patients blood work

## 2022-03-22 ENCOUNTER — Encounter: Payer: Self-pay | Admitting: Neurology

## 2022-03-22 NOTE — Telephone Encounter (Signed)
Spoke with pt she needs to call oncology to get lab results we didnt do any labs

## 2022-03-23 ENCOUNTER — Encounter: Payer: Self-pay | Admitting: Neurology

## 2022-03-25 ENCOUNTER — Ambulatory Visit (HOSPITAL_COMMUNITY)
Admission: RE | Admit: 2022-03-25 | Discharge: 2022-03-25 | Disposition: A | Discharge status: Home / Self Care | Payer: Medicare Other | Source: Ambulatory Visit | Attending: Hematology | Admitting: Hematology

## 2022-03-25 DIAGNOSIS — C210 Malignant neoplasm of anus, unspecified: Secondary | ICD-10-CM | POA: Insufficient documentation

## 2022-03-25 MED ORDER — SODIUM CHLORIDE (PF) 0.9 % IJ SOLN
INTRAMUSCULAR | Status: AC
  Filled 2022-03-25: qty 50

## 2022-03-25 MED ORDER — IOHEXOL 300 MG/ML  SOLN
80.0000 mL | Freq: Once | INTRAMUSCULAR | Status: AC | PRN
  Administered 2022-03-25: 80 mL via INTRAVENOUS

## 2022-03-26 DIAGNOSIS — M25511 Pain in right shoulder: Secondary | ICD-10-CM | POA: Diagnosis not present

## 2022-03-26 DIAGNOSIS — M25561 Pain in right knee: Secondary | ICD-10-CM | POA: Diagnosis not present

## 2022-03-26 DIAGNOSIS — R251 Tremor, unspecified: Secondary | ICD-10-CM | POA: Diagnosis not present

## 2022-03-31 DIAGNOSIS — F32A Depression, unspecified: Secondary | ICD-10-CM | POA: Diagnosis not present

## 2022-03-31 DIAGNOSIS — I4891 Unspecified atrial fibrillation: Secondary | ICD-10-CM | POA: Diagnosis not present

## 2022-03-31 DIAGNOSIS — I70213 Atherosclerosis of native arteries of extremities with intermittent claudication, bilateral legs: Secondary | ICD-10-CM | POA: Diagnosis not present

## 2022-03-31 DIAGNOSIS — I872 Venous insufficiency (chronic) (peripheral): Secondary | ICD-10-CM | POA: Diagnosis not present

## 2022-03-31 DIAGNOSIS — M069 Rheumatoid arthritis, unspecified: Secondary | ICD-10-CM | POA: Diagnosis not present

## 2022-03-31 DIAGNOSIS — E785 Hyperlipidemia, unspecified: Secondary | ICD-10-CM | POA: Diagnosis not present

## 2022-03-31 DIAGNOSIS — K219 Gastro-esophageal reflux disease without esophagitis: Secondary | ICD-10-CM | POA: Diagnosis not present

## 2022-03-31 DIAGNOSIS — E1151 Type 2 diabetes mellitus with diabetic peripheral angiopathy without gangrene: Secondary | ICD-10-CM | POA: Diagnosis not present

## 2022-03-31 DIAGNOSIS — Z85048 Personal history of other malignant neoplasm of rectum, rectosigmoid junction, and anus: Secondary | ICD-10-CM | POA: Diagnosis not present

## 2022-03-31 DIAGNOSIS — H4010X0 Unspecified open-angle glaucoma, stage unspecified: Secondary | ICD-10-CM | POA: Diagnosis not present

## 2022-03-31 DIAGNOSIS — E039 Hypothyroidism, unspecified: Secondary | ICD-10-CM | POA: Diagnosis not present

## 2022-03-31 DIAGNOSIS — J45909 Unspecified asthma, uncomplicated: Secondary | ICD-10-CM | POA: Diagnosis not present

## 2022-03-31 DIAGNOSIS — Z7902 Long term (current) use of antithrombotics/antiplatelets: Secondary | ICD-10-CM | POA: Diagnosis not present

## 2022-03-31 DIAGNOSIS — Z8673 Personal history of transient ischemic attack (TIA), and cerebral infarction without residual deficits: Secondary | ICD-10-CM | POA: Diagnosis not present

## 2022-03-31 DIAGNOSIS — I10 Essential (primary) hypertension: Secondary | ICD-10-CM | POA: Diagnosis not present

## 2022-03-31 DIAGNOSIS — N390 Urinary tract infection, site not specified: Secondary | ICD-10-CM | POA: Diagnosis not present

## 2022-04-02 DIAGNOSIS — Z8673 Personal history of transient ischemic attack (TIA), and cerebral infarction without residual deficits: Secondary | ICD-10-CM | POA: Diagnosis not present

## 2022-04-02 DIAGNOSIS — M069 Rheumatoid arthritis, unspecified: Secondary | ICD-10-CM | POA: Diagnosis not present

## 2022-04-02 DIAGNOSIS — H4010X0 Unspecified open-angle glaucoma, stage unspecified: Secondary | ICD-10-CM | POA: Diagnosis not present

## 2022-04-02 DIAGNOSIS — I10 Essential (primary) hypertension: Secondary | ICD-10-CM | POA: Diagnosis not present

## 2022-04-02 DIAGNOSIS — I70213 Atherosclerosis of native arteries of extremities with intermittent claudication, bilateral legs: Secondary | ICD-10-CM | POA: Diagnosis not present

## 2022-04-02 DIAGNOSIS — E039 Hypothyroidism, unspecified: Secondary | ICD-10-CM | POA: Diagnosis not present

## 2022-04-02 DIAGNOSIS — F32A Depression, unspecified: Secondary | ICD-10-CM | POA: Diagnosis not present

## 2022-04-02 DIAGNOSIS — Z7902 Long term (current) use of antithrombotics/antiplatelets: Secondary | ICD-10-CM | POA: Diagnosis not present

## 2022-04-02 DIAGNOSIS — I872 Venous insufficiency (chronic) (peripheral): Secondary | ICD-10-CM | POA: Diagnosis not present

## 2022-04-02 DIAGNOSIS — N390 Urinary tract infection, site not specified: Secondary | ICD-10-CM | POA: Diagnosis not present

## 2022-04-02 DIAGNOSIS — E785 Hyperlipidemia, unspecified: Secondary | ICD-10-CM | POA: Diagnosis not present

## 2022-04-02 DIAGNOSIS — I4891 Unspecified atrial fibrillation: Secondary | ICD-10-CM | POA: Diagnosis not present

## 2022-04-02 DIAGNOSIS — Z85048 Personal history of other malignant neoplasm of rectum, rectosigmoid junction, and anus: Secondary | ICD-10-CM | POA: Diagnosis not present

## 2022-04-02 DIAGNOSIS — J45909 Unspecified asthma, uncomplicated: Secondary | ICD-10-CM | POA: Diagnosis not present

## 2022-04-02 DIAGNOSIS — K219 Gastro-esophageal reflux disease without esophagitis: Secondary | ICD-10-CM | POA: Diagnosis not present

## 2022-04-02 DIAGNOSIS — E1151 Type 2 diabetes mellitus with diabetic peripheral angiopathy without gangrene: Secondary | ICD-10-CM | POA: Diagnosis not present

## 2022-04-04 DIAGNOSIS — Z7901 Long term (current) use of anticoagulants: Secondary | ICD-10-CM | POA: Diagnosis not present

## 2022-04-04 DIAGNOSIS — M47812 Spondylosis without myelopathy or radiculopathy, cervical region: Secondary | ICD-10-CM | POA: Diagnosis not present

## 2022-04-04 DIAGNOSIS — R519 Headache, unspecified: Secondary | ICD-10-CM | POA: Diagnosis not present

## 2022-04-04 DIAGNOSIS — Z8673 Personal history of transient ischemic attack (TIA), and cerebral infarction without residual deficits: Secondary | ICD-10-CM | POA: Diagnosis not present

## 2022-04-04 DIAGNOSIS — I4891 Unspecified atrial fibrillation: Secondary | ICD-10-CM | POA: Diagnosis not present

## 2022-04-04 DIAGNOSIS — R252 Cramp and spasm: Secondary | ICD-10-CM | POA: Diagnosis not present

## 2022-04-04 DIAGNOSIS — M47814 Spondylosis without myelopathy or radiculopathy, thoracic region: Secondary | ICD-10-CM | POA: Diagnosis not present

## 2022-04-04 DIAGNOSIS — Z7902 Long term (current) use of antithrombotics/antiplatelets: Secondary | ICD-10-CM | POA: Diagnosis not present

## 2022-04-04 DIAGNOSIS — Z85048 Personal history of other malignant neoplasm of rectum, rectosigmoid junction, and anus: Secondary | ICD-10-CM | POA: Diagnosis not present

## 2022-04-04 DIAGNOSIS — Z923 Personal history of irradiation: Secondary | ICD-10-CM | POA: Diagnosis not present

## 2022-04-04 DIAGNOSIS — Z79899 Other long term (current) drug therapy: Secondary | ICD-10-CM | POA: Diagnosis not present

## 2022-04-04 DIAGNOSIS — I6782 Cerebral ischemia: Secondary | ICD-10-CM | POA: Diagnosis not present

## 2022-04-04 DIAGNOSIS — R569 Unspecified convulsions: Secondary | ICD-10-CM | POA: Diagnosis not present

## 2022-04-04 DIAGNOSIS — R42 Dizziness and giddiness: Secondary | ICD-10-CM | POA: Diagnosis not present

## 2022-04-04 DIAGNOSIS — I959 Hypotension, unspecified: Secondary | ICD-10-CM | POA: Diagnosis not present

## 2022-04-04 DIAGNOSIS — E039 Hypothyroidism, unspecified: Secondary | ICD-10-CM | POA: Diagnosis not present

## 2022-04-04 DIAGNOSIS — K219 Gastro-esophageal reflux disease without esophagitis: Secondary | ICD-10-CM | POA: Diagnosis not present

## 2022-04-04 DIAGNOSIS — R251 Tremor, unspecified: Secondary | ICD-10-CM | POA: Diagnosis not present

## 2022-04-04 DIAGNOSIS — Z8619 Personal history of other infectious and parasitic diseases: Secondary | ICD-10-CM | POA: Diagnosis not present

## 2022-04-04 DIAGNOSIS — M4802 Spinal stenosis, cervical region: Secondary | ICD-10-CM | POA: Diagnosis not present

## 2022-04-04 DIAGNOSIS — R809 Proteinuria, unspecified: Secondary | ICD-10-CM | POA: Diagnosis not present

## 2022-04-04 DIAGNOSIS — I872 Venous insufficiency (chronic) (peripheral): Secondary | ICD-10-CM | POA: Diagnosis not present

## 2022-04-05 DIAGNOSIS — R569 Unspecified convulsions: Secondary | ICD-10-CM | POA: Diagnosis not present

## 2022-04-05 DIAGNOSIS — R258 Other abnormal involuntary movements: Secondary | ICD-10-CM | POA: Diagnosis not present

## 2022-04-05 DIAGNOSIS — Z7901 Long term (current) use of anticoagulants: Secondary | ICD-10-CM | POA: Diagnosis not present

## 2022-04-05 DIAGNOSIS — R519 Headache, unspecified: Secondary | ICD-10-CM | POA: Diagnosis not present

## 2022-04-05 DIAGNOSIS — R252 Cramp and spasm: Secondary | ICD-10-CM | POA: Diagnosis not present

## 2022-04-05 DIAGNOSIS — I482 Chronic atrial fibrillation, unspecified: Secondary | ICD-10-CM | POA: Diagnosis not present

## 2022-04-06 DIAGNOSIS — M47812 Spondylosis without myelopathy or radiculopathy, cervical region: Secondary | ICD-10-CM | POA: Diagnosis not present

## 2022-04-06 DIAGNOSIS — G9389 Other specified disorders of brain: Secondary | ICD-10-CM | POA: Diagnosis not present

## 2022-04-06 DIAGNOSIS — M47894 Other spondylosis, thoracic region: Secondary | ICD-10-CM | POA: Diagnosis not present

## 2022-04-06 DIAGNOSIS — R252 Cramp and spasm: Secondary | ICD-10-CM | POA: Diagnosis not present

## 2022-04-07 DIAGNOSIS — R252 Cramp and spasm: Secondary | ICD-10-CM | POA: Diagnosis not present

## 2022-04-07 DIAGNOSIS — R9089 Other abnormal findings on diagnostic imaging of central nervous system: Secondary | ICD-10-CM | POA: Diagnosis not present

## 2022-04-10 DIAGNOSIS — Z7902 Long term (current) use of antithrombotics/antiplatelets: Secondary | ICD-10-CM | POA: Diagnosis not present

## 2022-04-10 DIAGNOSIS — H4010X0 Unspecified open-angle glaucoma, stage unspecified: Secondary | ICD-10-CM | POA: Diagnosis not present

## 2022-04-10 DIAGNOSIS — F32A Depression, unspecified: Secondary | ICD-10-CM | POA: Diagnosis not present

## 2022-04-10 DIAGNOSIS — E039 Hypothyroidism, unspecified: Secondary | ICD-10-CM | POA: Diagnosis not present

## 2022-04-10 DIAGNOSIS — E785 Hyperlipidemia, unspecified: Secondary | ICD-10-CM | POA: Diagnosis not present

## 2022-04-10 DIAGNOSIS — K219 Gastro-esophageal reflux disease without esophagitis: Secondary | ICD-10-CM | POA: Diagnosis not present

## 2022-04-10 DIAGNOSIS — E1151 Type 2 diabetes mellitus with diabetic peripheral angiopathy without gangrene: Secondary | ICD-10-CM | POA: Diagnosis not present

## 2022-04-10 DIAGNOSIS — M069 Rheumatoid arthritis, unspecified: Secondary | ICD-10-CM | POA: Diagnosis not present

## 2022-04-10 DIAGNOSIS — Z8673 Personal history of transient ischemic attack (TIA), and cerebral infarction without residual deficits: Secondary | ICD-10-CM | POA: Diagnosis not present

## 2022-04-10 DIAGNOSIS — J45909 Unspecified asthma, uncomplicated: Secondary | ICD-10-CM | POA: Diagnosis not present

## 2022-04-10 DIAGNOSIS — I872 Venous insufficiency (chronic) (peripheral): Secondary | ICD-10-CM | POA: Diagnosis not present

## 2022-04-10 DIAGNOSIS — I10 Essential (primary) hypertension: Secondary | ICD-10-CM | POA: Diagnosis not present

## 2022-04-10 DIAGNOSIS — I4891 Unspecified atrial fibrillation: Secondary | ICD-10-CM | POA: Diagnosis not present

## 2022-04-10 DIAGNOSIS — Z85048 Personal history of other malignant neoplasm of rectum, rectosigmoid junction, and anus: Secondary | ICD-10-CM | POA: Diagnosis not present

## 2022-04-10 DIAGNOSIS — N390 Urinary tract infection, site not specified: Secondary | ICD-10-CM | POA: Diagnosis not present

## 2022-04-10 DIAGNOSIS — I70213 Atherosclerosis of native arteries of extremities with intermittent claudication, bilateral legs: Secondary | ICD-10-CM | POA: Diagnosis not present

## 2022-04-13 ENCOUNTER — Telehealth: Payer: Self-pay | Admitting: *Deleted

## 2022-04-14 DIAGNOSIS — H4010X0 Unspecified open-angle glaucoma, stage unspecified: Secondary | ICD-10-CM | POA: Diagnosis not present

## 2022-04-14 DIAGNOSIS — F32A Depression, unspecified: Secondary | ICD-10-CM | POA: Diagnosis not present

## 2022-04-14 DIAGNOSIS — K219 Gastro-esophageal reflux disease without esophagitis: Secondary | ICD-10-CM | POA: Diagnosis not present

## 2022-04-14 DIAGNOSIS — E785 Hyperlipidemia, unspecified: Secondary | ICD-10-CM | POA: Diagnosis not present

## 2022-04-14 DIAGNOSIS — E1151 Type 2 diabetes mellitus with diabetic peripheral angiopathy without gangrene: Secondary | ICD-10-CM | POA: Diagnosis not present

## 2022-04-14 DIAGNOSIS — I4891 Unspecified atrial fibrillation: Secondary | ICD-10-CM | POA: Diagnosis not present

## 2022-04-14 DIAGNOSIS — N390 Urinary tract infection, site not specified: Secondary | ICD-10-CM | POA: Diagnosis not present

## 2022-04-14 DIAGNOSIS — E039 Hypothyroidism, unspecified: Secondary | ICD-10-CM | POA: Diagnosis not present

## 2022-04-14 DIAGNOSIS — M069 Rheumatoid arthritis, unspecified: Secondary | ICD-10-CM | POA: Diagnosis not present

## 2022-04-14 DIAGNOSIS — Z8673 Personal history of transient ischemic attack (TIA), and cerebral infarction without residual deficits: Secondary | ICD-10-CM | POA: Diagnosis not present

## 2022-04-14 DIAGNOSIS — I10 Essential (primary) hypertension: Secondary | ICD-10-CM | POA: Diagnosis not present

## 2022-04-14 DIAGNOSIS — I872 Venous insufficiency (chronic) (peripheral): Secondary | ICD-10-CM | POA: Diagnosis not present

## 2022-04-14 DIAGNOSIS — J45909 Unspecified asthma, uncomplicated: Secondary | ICD-10-CM | POA: Diagnosis not present

## 2022-04-14 DIAGNOSIS — I70213 Atherosclerosis of native arteries of extremities with intermittent claudication, bilateral legs: Secondary | ICD-10-CM | POA: Diagnosis not present

## 2022-04-14 DIAGNOSIS — Z85048 Personal history of other malignant neoplasm of rectum, rectosigmoid junction, and anus: Secondary | ICD-10-CM | POA: Diagnosis not present

## 2022-04-14 DIAGNOSIS — Z7902 Long term (current) use of antithrombotics/antiplatelets: Secondary | ICD-10-CM | POA: Diagnosis not present

## 2022-04-15 ENCOUNTER — Ambulatory Visit
Admission: RE | Admit: 2022-04-15 | Discharge: 2022-04-15 | Disposition: A | Discharge status: Home / Self Care | Payer: Medicare Other | Source: Ambulatory Visit | Attending: Neurology | Admitting: Neurology

## 2022-04-15 DIAGNOSIS — R252 Cramp and spasm: Secondary | ICD-10-CM

## 2022-04-15 DIAGNOSIS — R9089 Other abnormal findings on diagnostic imaging of central nervous system: Secondary | ICD-10-CM

## 2022-04-15 DIAGNOSIS — R569 Unspecified convulsions: Secondary | ICD-10-CM

## 2022-04-15 DIAGNOSIS — R519 Headache, unspecified: Secondary | ICD-10-CM | POA: Diagnosis not present

## 2022-04-15 DIAGNOSIS — R42 Dizziness and giddiness: Secondary | ICD-10-CM | POA: Diagnosis not present

## 2022-04-15 MED ORDER — GADOPICLENOL 0.5 MMOL/ML IV SOLN
7.5000 mL | Freq: Once | INTRAVENOUS | Status: AC | PRN
  Administered 2022-04-15: 7.5 mL via INTRAVENOUS

## 2022-04-20 ENCOUNTER — Telehealth: Payer: Self-pay | Admitting: Neurology

## 2022-04-20 DIAGNOSIS — R252 Cramp and spasm: Secondary | ICD-10-CM

## 2022-04-20 DIAGNOSIS — R9089 Other abnormal findings on diagnostic imaging of central nervous system: Secondary | ICD-10-CM

## 2022-04-20 NOTE — Telephone Encounter (Signed)
I advised to son as well, thanked me for calling, note he is on [place]

## 2022-04-20 NOTE — Telephone Encounter (Signed)
Pt's son called in stating the pt was just given imaging results, but she didn't really understand. He would like to see if someone could give him a call so he can explain it to her better

## 2022-04-21 DIAGNOSIS — J069 Acute upper respiratory infection, unspecified: Secondary | ICD-10-CM | POA: Diagnosis not present

## 2022-04-21 DIAGNOSIS — K625 Hemorrhage of anus and rectum: Secondary | ICD-10-CM | POA: Diagnosis not present

## 2022-04-22 DIAGNOSIS — H4010X0 Unspecified open-angle glaucoma, stage unspecified: Secondary | ICD-10-CM | POA: Diagnosis not present

## 2022-04-22 DIAGNOSIS — I4891 Unspecified atrial fibrillation: Secondary | ICD-10-CM | POA: Diagnosis not present

## 2022-04-22 DIAGNOSIS — J45909 Unspecified asthma, uncomplicated: Secondary | ICD-10-CM | POA: Diagnosis not present

## 2022-04-22 DIAGNOSIS — E785 Hyperlipidemia, unspecified: Secondary | ICD-10-CM | POA: Diagnosis not present

## 2022-04-22 DIAGNOSIS — K219 Gastro-esophageal reflux disease without esophagitis: Secondary | ICD-10-CM | POA: Diagnosis not present

## 2022-04-22 DIAGNOSIS — I872 Venous insufficiency (chronic) (peripheral): Secondary | ICD-10-CM | POA: Diagnosis not present

## 2022-04-22 DIAGNOSIS — E1151 Type 2 diabetes mellitus with diabetic peripheral angiopathy without gangrene: Secondary | ICD-10-CM | POA: Diagnosis not present

## 2022-04-22 DIAGNOSIS — M069 Rheumatoid arthritis, unspecified: Secondary | ICD-10-CM | POA: Diagnosis not present

## 2022-04-22 DIAGNOSIS — E039 Hypothyroidism, unspecified: Secondary | ICD-10-CM | POA: Diagnosis not present

## 2022-04-22 DIAGNOSIS — Z85048 Personal history of other malignant neoplasm of rectum, rectosigmoid junction, and anus: Secondary | ICD-10-CM | POA: Diagnosis not present

## 2022-04-22 DIAGNOSIS — Z8673 Personal history of transient ischemic attack (TIA), and cerebral infarction without residual deficits: Secondary | ICD-10-CM | POA: Diagnosis not present

## 2022-04-22 DIAGNOSIS — Z7902 Long term (current) use of antithrombotics/antiplatelets: Secondary | ICD-10-CM | POA: Diagnosis not present

## 2022-04-22 DIAGNOSIS — I70213 Atherosclerosis of native arteries of extremities with intermittent claudication, bilateral legs: Secondary | ICD-10-CM | POA: Diagnosis not present

## 2022-04-22 DIAGNOSIS — N390 Urinary tract infection, site not specified: Secondary | ICD-10-CM | POA: Diagnosis not present

## 2022-04-22 DIAGNOSIS — F32A Depression, unspecified: Secondary | ICD-10-CM | POA: Diagnosis not present

## 2022-04-22 DIAGNOSIS — I10 Essential (primary) hypertension: Secondary | ICD-10-CM | POA: Diagnosis not present

## 2022-04-24 DIAGNOSIS — K219 Gastro-esophageal reflux disease without esophagitis: Secondary | ICD-10-CM | POA: Diagnosis not present

## 2022-04-24 DIAGNOSIS — F32A Depression, unspecified: Secondary | ICD-10-CM | POA: Diagnosis not present

## 2022-04-24 DIAGNOSIS — I10 Essential (primary) hypertension: Secondary | ICD-10-CM | POA: Diagnosis not present

## 2022-04-24 DIAGNOSIS — I872 Venous insufficiency (chronic) (peripheral): Secondary | ICD-10-CM | POA: Diagnosis not present

## 2022-04-24 DIAGNOSIS — E1151 Type 2 diabetes mellitus with diabetic peripheral angiopathy without gangrene: Secondary | ICD-10-CM | POA: Diagnosis not present

## 2022-04-24 DIAGNOSIS — I4891 Unspecified atrial fibrillation: Secondary | ICD-10-CM | POA: Diagnosis not present

## 2022-04-24 DIAGNOSIS — E039 Hypothyroidism, unspecified: Secondary | ICD-10-CM | POA: Diagnosis not present

## 2022-04-24 DIAGNOSIS — J45909 Unspecified asthma, uncomplicated: Secondary | ICD-10-CM | POA: Diagnosis not present

## 2022-04-24 DIAGNOSIS — M069 Rheumatoid arthritis, unspecified: Secondary | ICD-10-CM | POA: Diagnosis not present

## 2022-04-24 DIAGNOSIS — I70213 Atherosclerosis of native arteries of extremities with intermittent claudication, bilateral legs: Secondary | ICD-10-CM | POA: Diagnosis not present

## 2022-04-24 DIAGNOSIS — H4010X0 Unspecified open-angle glaucoma, stage unspecified: Secondary | ICD-10-CM | POA: Diagnosis not present

## 2022-04-27 DIAGNOSIS — E559 Vitamin D deficiency, unspecified: Secondary | ICD-10-CM | POA: Diagnosis not present

## 2022-04-27 DIAGNOSIS — E1142 Type 2 diabetes mellitus with diabetic polyneuropathy: Secondary | ICD-10-CM | POA: Diagnosis not present

## 2022-04-27 DIAGNOSIS — C210 Malignant neoplasm of anus, unspecified: Secondary | ICD-10-CM | POA: Diagnosis not present

## 2022-04-27 DIAGNOSIS — I25119 Atherosclerotic heart disease of native coronary artery with unspecified angina pectoris: Secondary | ICD-10-CM | POA: Diagnosis not present

## 2022-04-27 DIAGNOSIS — I700 Atherosclerosis of aorta: Secondary | ICD-10-CM | POA: Diagnosis not present

## 2022-04-27 DIAGNOSIS — Z87898 Personal history of other specified conditions: Secondary | ICD-10-CM | POA: Diagnosis not present

## 2022-04-27 DIAGNOSIS — E1151 Type 2 diabetes mellitus with diabetic peripheral angiopathy without gangrene: Secondary | ICD-10-CM | POA: Diagnosis not present

## 2022-04-27 DIAGNOSIS — I4891 Unspecified atrial fibrillation: Secondary | ICD-10-CM | POA: Diagnosis not present

## 2022-04-27 DIAGNOSIS — C779 Secondary and unspecified malignant neoplasm of lymph node, unspecified: Secondary | ICD-10-CM | POA: Diagnosis not present

## 2022-04-27 DIAGNOSIS — C801 Malignant (primary) neoplasm, unspecified: Secondary | ICD-10-CM | POA: Diagnosis not present

## 2022-04-29 ENCOUNTER — Other Ambulatory Visit (INDEPENDENT_AMBULATORY_CARE_PROVIDER_SITE_OTHER): Payer: Medicare Other

## 2022-04-29 ENCOUNTER — Encounter: Payer: Self-pay | Admitting: Neurology

## 2022-04-29 ENCOUNTER — Ambulatory Visit: Payer: Medicare Other | Admitting: Neurology

## 2022-04-29 VITALS — BP 184/70 | HR 77 | Ht 63.0 in | Wt 133.8 lb

## 2022-04-29 DIAGNOSIS — M159 Polyosteoarthritis, unspecified: Secondary | ICD-10-CM | POA: Diagnosis not present

## 2022-04-29 DIAGNOSIS — G8929 Other chronic pain: Secondary | ICD-10-CM | POA: Diagnosis not present

## 2022-04-29 DIAGNOSIS — I639 Cerebral infarction, unspecified: Secondary | ICD-10-CM | POA: Diagnosis not present

## 2022-04-29 DIAGNOSIS — R9089 Other abnormal findings on diagnostic imaging of central nervous system: Secondary | ICD-10-CM

## 2022-04-29 DIAGNOSIS — R569 Unspecified convulsions: Secondary | ICD-10-CM

## 2022-04-29 LAB — LIPID PANEL
Cholesterol: 147 mg/dL (ref 0–200)
HDL: 41 mg/dL (ref >39.00)
LDL Cholesterol: 79 mg/dL (ref 0–99)
NonHDL: 106.43
Total CHOL/HDL Ratio: 4
Triglycerides: 135 mg/dL (ref 0.0–149.0)
VLDL: 27 mg/dL (ref 0.0–40.0)

## 2022-04-29 LAB — HEMOGLOBIN A1C: Hgb A1c MFr Bld: 7.2 % — ABNORMAL HIGH (ref 4.6–6.5)

## 2022-04-29 MED ORDER — CLOPIDOGREL BISULFATE 75 MG PO TABS: 75.0000 mg | ORAL_TABLET | Freq: Every day | ORAL | 3 refills | Status: DC

## 2022-04-29 MED ORDER — LEVETIRACETAM 500 MG PO TABS: 500.0000 mg | ORAL_TABLET | Freq: Two times a day (BID) | ORAL | 3 refills | Status: AC

## 2022-04-29 NOTE — Patient Instructions (Addendum)
Good to see you doing better.  Bloodwork for lipid panel, HbA1c  Restart Plavix 75mg  daily. Continue Eliquis 2.5mg  twice a day  3. Continue Keppra (Levetiracetam) 500mg  twice a day  4. Schedule repeat brain MRI with and without contrast to be done in 6 weeks  5. Referral will be sent to Pain Management in [facility]  6. Follow-up in 8 weeks (I will see you after the repeat MRI), call for any changes.

## 2022-04-29 NOTE — Progress Notes (Signed)
NEUROLOGY FOLLOW UP OFFICE NOTE  Lorraine Gilbert [id number] [birth year]  HISTORY OF PRESENT ILLNESS: I had the pleasure of seeing Lorraine Gilbert in follow-up in the neurology clinic on 04/29/2022.  The patient was last seen 2 months ago for new onset seizure that occurred on 02/13/22. She was previously being seen in the office for memory loss, vertigo, and neuropathy. She is accompanied by her spouse today.  Records and images were personally reviewed where available. On her last visit, they denied any further episodes of unresponsiveness with left gaze/convulsions, left arm shaking since 02/13/22, however she was having episodes where she would vocalize as if taking a deep breath/hiccup then have upper body jerks. She was having these in the office on her last visit, however when she had the EEG a few minutes after clinic visit, EEG was normal and typical events were not captured. She is on Levetiracetam  BID. She reported feeling always dizzy with worse memory. She was then brought to the ER and admitted at [facility] from March 17-20 for the spasms occurring 3-4 times a day in clusters. She was evaluated by Neurology and had a brain MRI with and without contrast 04/06/22 showing T2/FLAIR hyperintense focus of restricted diffusion involving the left posterior periatrial white matter with enhancement adjacent to the left lateral extent of the splenium of the corpus callosum, likely acute/early subacute infarct. Metastatic disease is a differential consideration. No change in 6mm enhancing dural lesion overlying left frontal convexity with mild restricted diffusion, age-advanced cerebral and cerebellar volume loss with ex-vacuo dilatation of the ventricular system. She had an MRI of the cervical and thoracic spine which multilevel cervical and thoracic spondylosis, T2 hypointense lesion emanating from the right T9-T10 facet, favored to relate to osteophytic degenerative changes with a suggestion of mild cord  edema at T9-T10 secondary to mass effect. Long-term EEG captured an episode with no EEG correlate seen, baseline EEG normal. She was started on Baclofen  BID (not currently on her medication list).  On review of records, patient still proceeded with outpatient ordered brain MRI which was then done on 04/17/22. I personally reviewed images, there is restricted diffusion in the left lateral corpus callosum splenium with a small focus of contrast enhancement, differential diagnosis is subacute infarction versus metastasis, infarct is favored. She denies any focal numbness/tingling/weakness, no speech changes. She reports the spasms only occur now when she has pain in her leg. She reports she is in so much pain in her shoulders, hips, back, and legs. No bowel/bladder involvement. She sees Rheumatology but requests Pain Management referral. BP today is 184/70, she reports monitoring BP at home and SBP runs in 140s. She follows with Cardiology.    Update 03/16/2022: I had the pleasure of seeing Lorraine Gilbert in follow-up in the neurology clinic on 03/16/2022.  The patient was last seen on in November 2022 for memory loss, vertigo, and neuropathy. She presents today for a new symptom of new onset seizure that occurred on 02/13/22. She is accompanied by her son who helps supplement the history today.  Records and images were personally reviewed where available. She was admitted to [facility] from January 26 to February 22, 2022. Her other son found her tensed up and drooling. When she arrived to the ER, she was unresponsive with left gaze and convulsions for 7-10 minutes. She was given  IV Versed. Head CT/CTA no acute changes. She had another episode in the ER with rhythmic left arm shaking, head turned to the right,  gaze to the left while still talking to the ER physician. Episode resolved with Ativan. She was started on Levetiracetam. Long-term EEG monitoring done showed lateralized periodic discharges with  right frontal maximum, independent sharp waves, multifocal sharp waves along with mild background slowing. Brain MRI reported restricted diffusion and slightly expansile T2/FLAIR hyperintense signal in the anterior medical right temporal lobe and dorsal medial right thalamus, likely related to recent seizure activity, although acute right thalamic infarct or early changes of herpes encephalitis cannot be entirely excluded. She was started on Acyclovis. Mental status improved while awaiting washout period for Plavix, so lumbar puncture was deferred. She was discharged home on Levetiracetam 500mg  BID. Due to concern for new stroke, she had a stroke workup as well with TTE showing an EF of 50-60%, moderate to severe AR, HbA1c 7.8, LDL 124. CTA showed severe intracranial atherosclerosis and remote right cerebellar lacune. She states that on hospital discharge, she stopped both the Plavix and Eliquis. Although notes indicate to continue Plavix and consider anticoagulation for atrial fibrillation as she was previously on Eliquis, increase Crestor as tolerated.   Family denies any further similar seizures. Since hospital discharge though, she is always dizzy and sometimes cannot remember things. She has been having episodes where she would vocalize as if taking a deep breath/hiccup then have upper body jerks. These would only last no more than 3 seconds, there is no pain, she calls it an involuntary muscle spasm, but they can occur several times an hour, sometimes waking her up. She had 2 in the office today. There is no loss of consciousness/unresponsiveness. Her other son on speakerphone reported she was having periods where she would scream with eyes closed and then say she is in pain. These have quieted down. She would be exercising and say she has pain in her legs. She states pain is a lot better, she is not in so much pain when moving around. She notes drowsiness most of the time, her son feels she is bored. She  still has light headaches, these have worsened since hospital discharge. Family denies any staring/unresponsive episodes. She reports a lump on the left occipital area, sometime it is swollen then would resolve. She denies any abdominal pain.     PAST MEDICAL HISTORY: Past Medical History:  Diagnosis Date   A-fib    Abnormal heart rhythm    Allergic rhinitis    anal ca 01/2018   with mets to left inguinal ln 01/2018   Anxiety    Asthma    Diabetes mellitus without complication    Type II   GERD (gastroesophageal reflux disease)    Headache    History of radiation therapy 07/05/2018-07/18/2018   left pelvis        Dr Antony Blackbird   History of radiation therapy    Anus- 03/30/21-05/07/21- Dr. Antony Blackbird   Hypertension    Neuromuscular disorder    Siactic pain in right leg   Osteopenia    Rheumatoid arthritis    Rotator cuff tear arthropathy    Right   Seizures    Vertigo     MEDICATIONS: Current Outpatient Medications on File Prior to Visit  Medication Sig Dispense Refill   ACCU-CHEK AVIVA PLUS test strip 1 each as directed.     Accu-Chek Softclix Lancets lancets 1 each by Other route as directed.     ACETAMINOPHEN 8 HOUR PO Take 1 tablet by mouth as needed.     albuterol (PROVENTIL) (2.5 MG/3ML) 0.083% nebulizer  solution SMARTSIG:1 Vial(s) Via Nebulizer Every 4-6 Hours PRN     Alcohol Swabs (ALCOHOL WIPES) 70 % PADS DropSafe Alcohol Prep Pads     amiodarone (PACERONE) 100 MG tablet Take 100 mg by mouth daily.     benzonatate (TESSALON) 100 MG capsule Take 100 mg by mouth 3 (three) times daily as needed.     Blood Glucose Calibration (ACCU-CHEK AVIVA) SOLN      Blood Glucose Monitoring Suppl (TRUE METRIX AIR GLUCOSE METER) w/Device KIT See admin instructions.     ELIQUIS 2.5 MG TABS tablet Take 2.5 mg by mouth 2 (two) times daily.     escitalopram (LEXAPRO) 10 MG tablet Take 10 mg by mouth daily.     levalbuterol (XOPENEX) 1.25 MG/3ML nebulizer solution Inhale into the  lungs.     levETIRAcetam (KEPPRA) 500 MG tablet Take 1 tablet (500 mg total) by mouth 2 (two) times daily. 180 tablet 3   mirtazapine (REMERON) 7.5 MG tablet Take 1 tablet (7.5 mg total) by mouth at bedtime. 30 tablet 0   nitroGLYCERIN (NITROSTAT) 0.4 MG SL tablet Place 0.4 mg under the tongue every 5 (five) minutes as needed for chest pain.     polyethylene glycol powder (GLYCOLAX/MIRALAX) 17 GM/SCOOP powder Take by mouth.     rosuvastatin (CRESTOR) 5 MG tablet Take by mouth.     TRELEGY ELLIPTA 200-62.5-25 MCG/ACT AEPB Take 1 puff by mouth daily.     No current facility-administered medications on file prior to visit.    ALLERGIES: Allergies  Allergen Reactions   Ibuprofen Other (See Comments)    Confusion, Patient states that the 800mg  Motrin made her feel like she was flying.    FAMILY HISTORY: Family History  Problem Relation Age of Onset   Emphysema Father        smoked   Heart disease Mother    Heart attack Son    Breast cancer Neg Hx     SOCIAL HISTORY: Social History   Socioeconomic History   Marital status: Married    Spouse name: Pascual   Number of children: 6   Years of education: Not on file   Highest education level: Not on file  Occupational History   Occupation: Retired English as a second language teacher  Tobacco Use   Smoking status: Never   Smokeless tobacco: Never  Vaping Use   Vaping Use: Never used  Substance and Sexual Activity   Alcohol use: No    Alcohol/week: 0.0 standard drinks of alcohol   Drug use: No   Sexual activity: Not on file  Other Topics Concern   Not on file  Social History Narrative   Right Handed   Lives with husband and son   One Story Home   Caffeine, Yes drinks coffee    Social Determinants of Health   Financial Resource Strain: Not on file  Food Insecurity: Not on file  Transportation Needs: Not on file  Physical Activity: Not on file  Stress: Not on file  Social Connections: Not on file  Intimate Partner Violence: Not on  file     PHYSICAL EXAM: Vitals:   04/29/22 0857  BP: (!) 184/70  Pulse: 77  SpO2: 98%   General: No acute distress Head:  Normocephalic/atraumatic Skin/Extremities: No rash, no edema Neurological Exam: alert and awake. No aphasia or dysarthria. Fund of knowledge is appropriate. Attention and concentration are normal.   Cranial nerves: Pupils equal, round. Extraocular movements intact with no nystagmus. Visual fields full.  No facial asymmetry.  Motor: Bulk and tone normal, muscle strength 5/5 throughout with no pronator drift. Reflexes +1 throughout.  Finger to nose testing intact.  Gait slow and cautious reporting bilateral leg pain and balance being off. No ataxia.    IMPRESSION: This is a pleasant 89yo with hypertension, hyperlipidemia, diabetes, atrial fibrillation on Eliquis, PMR, mild dementia, neuropathy, new onset seizures that occurred 02/13/2022 with EEG at that time showing LPDs with right frontal maximum activity, independent sharp waves, multifocal sharp waves. MRI at that time showed changes in the anterior medial RIGHT temporal lobe and dorsal medial right thalamus. These have resolved on follow-up brain MRI however most recent MRIs done 3/19 and 04/17/22 showed restricted diffusion in the LEFT lateral corpus callosum splenium, subacute infarct versus metastasis. She was advised to restart Plavix 75mg  daily in addition to Eliquis. Follow-up MRI will be scheduled for 6 weeks. Check lipid panel, HbA1c, continue control of vascular risk factors. She denies any seizures since 01/2022, continue Levetiracetam 500mg  BID. She reports significant body pain from osteoarthritis and requests Pain Management referral closer to home. She does not drive. Follow-up in 8 weeks (after brain MRI), they know to call for any changes.    Thank you for allowing me to participate in her care.  Please do not hesitate to call for any questions or concerns.    Patrcia DollyKaren Kamara Allan, M.D.   CC: Dr. Azucena CecilSwayne

## 2022-05-03 DIAGNOSIS — I25118 Atherosclerotic heart disease of native coronary artery with other forms of angina pectoris: Secondary | ICD-10-CM | POA: Diagnosis not present

## 2022-05-03 DIAGNOSIS — D701 Agranulocytosis secondary to cancer chemotherapy: Secondary | ICD-10-CM | POA: Diagnosis not present

## 2022-05-03 DIAGNOSIS — E1139 Type 2 diabetes mellitus with other diabetic ophthalmic complication: Secondary | ICD-10-CM | POA: Diagnosis not present

## 2022-05-03 DIAGNOSIS — C801 Malignant (primary) neoplasm, unspecified: Secondary | ICD-10-CM | POA: Diagnosis not present

## 2022-05-03 DIAGNOSIS — I5032 Chronic diastolic (congestive) heart failure: Secondary | ICD-10-CM | POA: Diagnosis not present

## 2022-05-03 DIAGNOSIS — I739 Peripheral vascular disease, unspecified: Secondary | ICD-10-CM | POA: Diagnosis not present

## 2022-05-03 DIAGNOSIS — G619 Inflammatory polyneuropathy, unspecified: Secondary | ICD-10-CM | POA: Diagnosis not present

## 2022-05-03 DIAGNOSIS — I480 Paroxysmal atrial fibrillation: Secondary | ICD-10-CM | POA: Diagnosis not present

## 2022-05-03 DIAGNOSIS — C210 Malignant neoplasm of anus, unspecified: Secondary | ICD-10-CM | POA: Diagnosis not present

## 2022-05-03 DIAGNOSIS — G622 Polyneuropathy due to other toxic agents: Secondary | ICD-10-CM | POA: Diagnosis not present

## 2022-05-03 DIAGNOSIS — T451X5A Adverse effect of antineoplastic and immunosuppressive drugs, initial encounter: Secondary | ICD-10-CM | POA: Diagnosis not present

## 2022-05-03 DIAGNOSIS — C779 Secondary and unspecified malignant neoplasm of lymph node, unspecified: Secondary | ICD-10-CM | POA: Diagnosis not present

## 2022-05-04 DIAGNOSIS — E1151 Type 2 diabetes mellitus with diabetic peripheral angiopathy without gangrene: Secondary | ICD-10-CM | POA: Diagnosis not present

## 2022-05-04 DIAGNOSIS — K219 Gastro-esophageal reflux disease without esophagitis: Secondary | ICD-10-CM | POA: Diagnosis not present

## 2022-05-04 DIAGNOSIS — Z85048 Personal history of other malignant neoplasm of rectum, rectosigmoid junction, and anus: Secondary | ICD-10-CM | POA: Diagnosis not present

## 2022-05-04 DIAGNOSIS — E785 Hyperlipidemia, unspecified: Secondary | ICD-10-CM | POA: Diagnosis not present

## 2022-05-04 DIAGNOSIS — I70213 Atherosclerosis of native arteries of extremities with intermittent claudication, bilateral legs: Secondary | ICD-10-CM | POA: Diagnosis not present

## 2022-05-04 DIAGNOSIS — Z7902 Long term (current) use of antithrombotics/antiplatelets: Secondary | ICD-10-CM | POA: Diagnosis not present

## 2022-05-04 DIAGNOSIS — Z8673 Personal history of transient ischemic attack (TIA), and cerebral infarction without residual deficits: Secondary | ICD-10-CM | POA: Diagnosis not present

## 2022-05-04 DIAGNOSIS — Z87440 Personal history of urinary (tract) infections: Secondary | ICD-10-CM | POA: Diagnosis not present

## 2022-05-04 DIAGNOSIS — E039 Hypothyroidism, unspecified: Secondary | ICD-10-CM | POA: Diagnosis not present

## 2022-05-04 DIAGNOSIS — F32A Depression, unspecified: Secondary | ICD-10-CM | POA: Diagnosis not present

## 2022-05-04 DIAGNOSIS — I872 Venous insufficiency (chronic) (peripheral): Secondary | ICD-10-CM | POA: Diagnosis not present

## 2022-05-04 DIAGNOSIS — I4891 Unspecified atrial fibrillation: Secondary | ICD-10-CM | POA: Diagnosis not present

## 2022-05-04 DIAGNOSIS — I10 Essential (primary) hypertension: Secondary | ICD-10-CM | POA: Diagnosis not present

## 2022-05-04 DIAGNOSIS — H4010X0 Unspecified open-angle glaucoma, stage unspecified: Secondary | ICD-10-CM | POA: Diagnosis not present

## 2022-05-04 DIAGNOSIS — J45909 Unspecified asthma, uncomplicated: Secondary | ICD-10-CM | POA: Diagnosis not present

## 2022-05-04 DIAGNOSIS — M069 Rheumatoid arthritis, unspecified: Secondary | ICD-10-CM | POA: Diagnosis not present

## 2022-05-07 DIAGNOSIS — I2510 Atherosclerotic heart disease of native coronary artery without angina pectoris: Secondary | ICD-10-CM | POA: Diagnosis not present

## 2022-05-12 DIAGNOSIS — Z85048 Personal history of other malignant neoplasm of rectum, rectosigmoid junction, and anus: Secondary | ICD-10-CM | POA: Diagnosis not present

## 2022-05-12 DIAGNOSIS — I872 Venous insufficiency (chronic) (peripheral): Secondary | ICD-10-CM | POA: Diagnosis not present

## 2022-05-12 DIAGNOSIS — E785 Hyperlipidemia, unspecified: Secondary | ICD-10-CM | POA: Diagnosis not present

## 2022-05-12 DIAGNOSIS — K219 Gastro-esophageal reflux disease without esophagitis: Secondary | ICD-10-CM | POA: Diagnosis not present

## 2022-05-12 DIAGNOSIS — E1151 Type 2 diabetes mellitus with diabetic peripheral angiopathy without gangrene: Secondary | ICD-10-CM | POA: Diagnosis not present

## 2022-05-12 DIAGNOSIS — M069 Rheumatoid arthritis, unspecified: Secondary | ICD-10-CM | POA: Diagnosis not present

## 2022-05-12 DIAGNOSIS — I4891 Unspecified atrial fibrillation: Secondary | ICD-10-CM | POA: Diagnosis not present

## 2022-05-12 DIAGNOSIS — Z7902 Long term (current) use of antithrombotics/antiplatelets: Secondary | ICD-10-CM | POA: Diagnosis not present

## 2022-05-12 DIAGNOSIS — E039 Hypothyroidism, unspecified: Secondary | ICD-10-CM | POA: Diagnosis not present

## 2022-05-12 DIAGNOSIS — H4010X0 Unspecified open-angle glaucoma, stage unspecified: Secondary | ICD-10-CM | POA: Diagnosis not present

## 2022-05-12 DIAGNOSIS — I70213 Atherosclerosis of native arteries of extremities with intermittent claudication, bilateral legs: Secondary | ICD-10-CM | POA: Diagnosis not present

## 2022-05-12 DIAGNOSIS — I10 Essential (primary) hypertension: Secondary | ICD-10-CM | POA: Diagnosis not present

## 2022-05-12 DIAGNOSIS — J45909 Unspecified asthma, uncomplicated: Secondary | ICD-10-CM | POA: Diagnosis not present

## 2022-05-12 DIAGNOSIS — Z8673 Personal history of transient ischemic attack (TIA), and cerebral infarction without residual deficits: Secondary | ICD-10-CM | POA: Diagnosis not present

## 2022-05-12 DIAGNOSIS — F32A Depression, unspecified: Secondary | ICD-10-CM | POA: Diagnosis not present

## 2022-05-12 DIAGNOSIS — Z87440 Personal history of urinary (tract) infections: Secondary | ICD-10-CM | POA: Diagnosis not present

## 2022-05-13 DIAGNOSIS — R2689 Other abnormalities of gait and mobility: Secondary | ICD-10-CM | POA: Diagnosis not present

## 2022-05-13 DIAGNOSIS — R252 Cramp and spasm: Secondary | ICD-10-CM | POA: Diagnosis not present

## 2022-05-14 DIAGNOSIS — M48062 Spinal stenosis, lumbar region with neurogenic claudication: Secondary | ICD-10-CM | POA: Diagnosis not present

## 2022-05-14 DIAGNOSIS — M79605 Pain in left leg: Secondary | ICD-10-CM | POA: Diagnosis not present

## 2022-05-14 DIAGNOSIS — M2550 Pain in unspecified joint: Secondary | ICD-10-CM | POA: Diagnosis not present

## 2022-05-14 DIAGNOSIS — M79604 Pain in right leg: Secondary | ICD-10-CM | POA: Diagnosis not present

## 2022-05-14 DIAGNOSIS — M545 Low back pain, unspecified: Secondary | ICD-10-CM | POA: Diagnosis not present

## 2022-05-14 DIAGNOSIS — G8929 Other chronic pain: Secondary | ICD-10-CM | POA: Diagnosis not present

## 2022-05-17 DIAGNOSIS — F32A Depression, unspecified: Secondary | ICD-10-CM | POA: Diagnosis not present

## 2022-05-17 DIAGNOSIS — H4010X0 Unspecified open-angle glaucoma, stage unspecified: Secondary | ICD-10-CM | POA: Diagnosis not present

## 2022-05-17 DIAGNOSIS — Z87440 Personal history of urinary (tract) infections: Secondary | ICD-10-CM | POA: Diagnosis not present

## 2022-05-17 DIAGNOSIS — J45909 Unspecified asthma, uncomplicated: Secondary | ICD-10-CM | POA: Diagnosis not present

## 2022-05-17 DIAGNOSIS — E039 Hypothyroidism, unspecified: Secondary | ICD-10-CM | POA: Diagnosis not present

## 2022-05-17 DIAGNOSIS — I70213 Atherosclerosis of native arteries of extremities with intermittent claudication, bilateral legs: Secondary | ICD-10-CM | POA: Diagnosis not present

## 2022-05-17 DIAGNOSIS — E1151 Type 2 diabetes mellitus with diabetic peripheral angiopathy without gangrene: Secondary | ICD-10-CM | POA: Diagnosis not present

## 2022-05-17 DIAGNOSIS — Z8673 Personal history of transient ischemic attack (TIA), and cerebral infarction without residual deficits: Secondary | ICD-10-CM | POA: Diagnosis not present

## 2022-05-17 DIAGNOSIS — E785 Hyperlipidemia, unspecified: Secondary | ICD-10-CM | POA: Diagnosis not present

## 2022-05-17 DIAGNOSIS — K219 Gastro-esophageal reflux disease without esophagitis: Secondary | ICD-10-CM | POA: Diagnosis not present

## 2022-05-17 DIAGNOSIS — Z85048 Personal history of other malignant neoplasm of rectum, rectosigmoid junction, and anus: Secondary | ICD-10-CM | POA: Diagnosis not present

## 2022-05-17 DIAGNOSIS — I4891 Unspecified atrial fibrillation: Secondary | ICD-10-CM | POA: Diagnosis not present

## 2022-05-17 DIAGNOSIS — Z7902 Long term (current) use of antithrombotics/antiplatelets: Secondary | ICD-10-CM | POA: Diagnosis not present

## 2022-05-17 DIAGNOSIS — M069 Rheumatoid arthritis, unspecified: Secondary | ICD-10-CM | POA: Diagnosis not present

## 2022-05-17 DIAGNOSIS — I872 Venous insufficiency (chronic) (peripheral): Secondary | ICD-10-CM | POA: Diagnosis not present

## 2022-05-17 DIAGNOSIS — I10 Essential (primary) hypertension: Secondary | ICD-10-CM | POA: Diagnosis not present

## 2022-05-19 ENCOUNTER — Ambulatory Visit
Admission: RE | Admit: 2022-05-19 | Discharge: 2022-05-19 | Disposition: A | Discharge status: Home / Self Care | Payer: Medicare Other | Source: Ambulatory Visit | Attending: Neurology | Admitting: Neurology

## 2022-05-19 DIAGNOSIS — R252 Cramp and spasm: Secondary | ICD-10-CM

## 2022-05-19 DIAGNOSIS — R259 Unspecified abnormal involuntary movements: Secondary | ICD-10-CM | POA: Diagnosis not present

## 2022-05-19 DIAGNOSIS — R9089 Other abnormal findings on diagnostic imaging of central nervous system: Secondary | ICD-10-CM

## 2022-05-19 MED ORDER — GADOPICLENOL 0.5 MMOL/ML IV SOLN
6.0000 mL | Freq: Once | INTRAVENOUS | Status: AC | PRN
  Administered 2022-05-19: 6 mL via INTRAVENOUS

## 2022-05-21 DIAGNOSIS — I70213 Atherosclerosis of native arteries of extremities with intermittent claudication, bilateral legs: Secondary | ICD-10-CM | POA: Diagnosis not present

## 2022-05-21 DIAGNOSIS — E1151 Type 2 diabetes mellitus with diabetic peripheral angiopathy without gangrene: Secondary | ICD-10-CM | POA: Diagnosis not present

## 2022-05-21 DIAGNOSIS — E785 Hyperlipidemia, unspecified: Secondary | ICD-10-CM | POA: Diagnosis not present

## 2022-05-21 DIAGNOSIS — Z85048 Personal history of other malignant neoplasm of rectum, rectosigmoid junction, and anus: Secondary | ICD-10-CM | POA: Diagnosis not present

## 2022-05-21 DIAGNOSIS — E039 Hypothyroidism, unspecified: Secondary | ICD-10-CM | POA: Diagnosis not present

## 2022-05-21 DIAGNOSIS — I4891 Unspecified atrial fibrillation: Secondary | ICD-10-CM | POA: Diagnosis not present

## 2022-05-21 DIAGNOSIS — Z8673 Personal history of transient ischemic attack (TIA), and cerebral infarction without residual deficits: Secondary | ICD-10-CM | POA: Diagnosis not present

## 2022-05-21 DIAGNOSIS — Z7902 Long term (current) use of antithrombotics/antiplatelets: Secondary | ICD-10-CM | POA: Diagnosis not present

## 2022-05-21 DIAGNOSIS — K219 Gastro-esophageal reflux disease without esophagitis: Secondary | ICD-10-CM | POA: Diagnosis not present

## 2022-05-21 DIAGNOSIS — I10 Essential (primary) hypertension: Secondary | ICD-10-CM | POA: Diagnosis not present

## 2022-05-21 DIAGNOSIS — H4010X0 Unspecified open-angle glaucoma, stage unspecified: Secondary | ICD-10-CM | POA: Diagnosis not present

## 2022-05-21 DIAGNOSIS — M069 Rheumatoid arthritis, unspecified: Secondary | ICD-10-CM | POA: Diagnosis not present

## 2022-05-21 DIAGNOSIS — J45909 Unspecified asthma, uncomplicated: Secondary | ICD-10-CM | POA: Diagnosis not present

## 2022-05-21 DIAGNOSIS — Z87440 Personal history of urinary (tract) infections: Secondary | ICD-10-CM | POA: Diagnosis not present

## 2022-05-21 DIAGNOSIS — I872 Venous insufficiency (chronic) (peripheral): Secondary | ICD-10-CM | POA: Diagnosis not present

## 2022-05-21 DIAGNOSIS — F32A Depression, unspecified: Secondary | ICD-10-CM | POA: Diagnosis not present

## 2022-05-24 DIAGNOSIS — R140 Abdominal distension (gaseous): Secondary | ICD-10-CM | POA: Diagnosis not present

## 2022-05-25 DIAGNOSIS — E039 Hypothyroidism, unspecified: Secondary | ICD-10-CM | POA: Diagnosis not present

## 2022-05-25 DIAGNOSIS — Z8673 Personal history of transient ischemic attack (TIA), and cerebral infarction without residual deficits: Secondary | ICD-10-CM | POA: Diagnosis not present

## 2022-05-25 DIAGNOSIS — Z87440 Personal history of urinary (tract) infections: Secondary | ICD-10-CM | POA: Diagnosis not present

## 2022-05-25 DIAGNOSIS — E785 Hyperlipidemia, unspecified: Secondary | ICD-10-CM | POA: Diagnosis not present

## 2022-05-25 DIAGNOSIS — I10 Essential (primary) hypertension: Secondary | ICD-10-CM | POA: Diagnosis not present

## 2022-05-25 DIAGNOSIS — Z85048 Personal history of other malignant neoplasm of rectum, rectosigmoid junction, and anus: Secondary | ICD-10-CM | POA: Diagnosis not present

## 2022-05-25 DIAGNOSIS — I872 Venous insufficiency (chronic) (peripheral): Secondary | ICD-10-CM | POA: Diagnosis not present

## 2022-05-25 DIAGNOSIS — H4010X0 Unspecified open-angle glaucoma, stage unspecified: Secondary | ICD-10-CM | POA: Diagnosis not present

## 2022-05-25 DIAGNOSIS — J45909 Unspecified asthma, uncomplicated: Secondary | ICD-10-CM | POA: Diagnosis not present

## 2022-05-25 DIAGNOSIS — M069 Rheumatoid arthritis, unspecified: Secondary | ICD-10-CM | POA: Diagnosis not present

## 2022-05-25 DIAGNOSIS — E1151 Type 2 diabetes mellitus with diabetic peripheral angiopathy without gangrene: Secondary | ICD-10-CM | POA: Diagnosis not present

## 2022-05-25 DIAGNOSIS — Z7902 Long term (current) use of antithrombotics/antiplatelets: Secondary | ICD-10-CM | POA: Diagnosis not present

## 2022-05-25 DIAGNOSIS — I4891 Unspecified atrial fibrillation: Secondary | ICD-10-CM | POA: Diagnosis not present

## 2022-05-25 DIAGNOSIS — F32A Depression, unspecified: Secondary | ICD-10-CM | POA: Diagnosis not present

## 2022-05-25 DIAGNOSIS — I70213 Atherosclerosis of native arteries of extremities with intermittent claudication, bilateral legs: Secondary | ICD-10-CM | POA: Diagnosis not present

## 2022-05-25 DIAGNOSIS — K219 Gastro-esophageal reflux disease without esophagitis: Secondary | ICD-10-CM | POA: Diagnosis not present

## 2022-06-03 DIAGNOSIS — I70213 Atherosclerosis of native arteries of extremities with intermittent claudication, bilateral legs: Secondary | ICD-10-CM | POA: Diagnosis not present

## 2022-06-03 DIAGNOSIS — H4010X0 Unspecified open-angle glaucoma, stage unspecified: Secondary | ICD-10-CM | POA: Diagnosis not present

## 2022-06-03 DIAGNOSIS — I872 Venous insufficiency (chronic) (peripheral): Secondary | ICD-10-CM | POA: Diagnosis not present

## 2022-06-03 DIAGNOSIS — Z85048 Personal history of other malignant neoplasm of rectum, rectosigmoid junction, and anus: Secondary | ICD-10-CM | POA: Diagnosis not present

## 2022-06-03 DIAGNOSIS — E039 Hypothyroidism, unspecified: Secondary | ICD-10-CM | POA: Diagnosis not present

## 2022-06-03 DIAGNOSIS — M069 Rheumatoid arthritis, unspecified: Secondary | ICD-10-CM | POA: Diagnosis not present

## 2022-06-03 DIAGNOSIS — E1151 Type 2 diabetes mellitus with diabetic peripheral angiopathy without gangrene: Secondary | ICD-10-CM | POA: Diagnosis not present

## 2022-06-03 DIAGNOSIS — F32A Depression, unspecified: Secondary | ICD-10-CM | POA: Diagnosis not present

## 2022-06-03 DIAGNOSIS — J45909 Unspecified asthma, uncomplicated: Secondary | ICD-10-CM | POA: Diagnosis not present

## 2022-06-03 DIAGNOSIS — Z8673 Personal history of transient ischemic attack (TIA), and cerebral infarction without residual deficits: Secondary | ICD-10-CM | POA: Diagnosis not present

## 2022-06-03 DIAGNOSIS — Z7902 Long term (current) use of antithrombotics/antiplatelets: Secondary | ICD-10-CM | POA: Diagnosis not present

## 2022-06-03 DIAGNOSIS — K219 Gastro-esophageal reflux disease without esophagitis: Secondary | ICD-10-CM | POA: Diagnosis not present

## 2022-06-03 DIAGNOSIS — Z87440 Personal history of urinary (tract) infections: Secondary | ICD-10-CM | POA: Diagnosis not present

## 2022-06-03 DIAGNOSIS — I10 Essential (primary) hypertension: Secondary | ICD-10-CM | POA: Diagnosis not present

## 2022-06-03 DIAGNOSIS — E785 Hyperlipidemia, unspecified: Secondary | ICD-10-CM | POA: Diagnosis not present

## 2022-06-03 DIAGNOSIS — I4891 Unspecified atrial fibrillation: Secondary | ICD-10-CM | POA: Diagnosis not present

## 2022-06-04 DIAGNOSIS — M48062 Spinal stenosis, lumbar region with neurogenic claudication: Secondary | ICD-10-CM | POA: Diagnosis not present

## 2022-06-04 DIAGNOSIS — R531 Weakness: Secondary | ICD-10-CM | POA: Diagnosis not present

## 2022-06-04 DIAGNOSIS — R2689 Other abnormalities of gait and mobility: Secondary | ICD-10-CM | POA: Diagnosis not present

## 2022-06-04 DIAGNOSIS — Z7409 Other reduced mobility: Secondary | ICD-10-CM | POA: Diagnosis not present

## 2022-06-09 DIAGNOSIS — Z85048 Personal history of other malignant neoplasm of rectum, rectosigmoid junction, and anus: Secondary | ICD-10-CM | POA: Diagnosis not present

## 2022-06-09 DIAGNOSIS — E1151 Type 2 diabetes mellitus with diabetic peripheral angiopathy without gangrene: Secondary | ICD-10-CM | POA: Diagnosis not present

## 2022-06-09 DIAGNOSIS — I4891 Unspecified atrial fibrillation: Secondary | ICD-10-CM | POA: Diagnosis not present

## 2022-06-09 DIAGNOSIS — I10 Essential (primary) hypertension: Secondary | ICD-10-CM | POA: Diagnosis not present

## 2022-06-09 DIAGNOSIS — M069 Rheumatoid arthritis, unspecified: Secondary | ICD-10-CM | POA: Diagnosis not present

## 2022-06-09 DIAGNOSIS — Z87440 Personal history of urinary (tract) infections: Secondary | ICD-10-CM | POA: Diagnosis not present

## 2022-06-09 DIAGNOSIS — I70213 Atherosclerosis of native arteries of extremities with intermittent claudication, bilateral legs: Secondary | ICD-10-CM | POA: Diagnosis not present

## 2022-06-09 DIAGNOSIS — J45909 Unspecified asthma, uncomplicated: Secondary | ICD-10-CM | POA: Diagnosis not present

## 2022-06-09 DIAGNOSIS — F32A Depression, unspecified: Secondary | ICD-10-CM | POA: Diagnosis not present

## 2022-06-09 DIAGNOSIS — K219 Gastro-esophageal reflux disease without esophagitis: Secondary | ICD-10-CM | POA: Diagnosis not present

## 2022-06-09 DIAGNOSIS — E785 Hyperlipidemia, unspecified: Secondary | ICD-10-CM | POA: Diagnosis not present

## 2022-06-09 DIAGNOSIS — Z8673 Personal history of transient ischemic attack (TIA), and cerebral infarction without residual deficits: Secondary | ICD-10-CM | POA: Diagnosis not present

## 2022-06-09 DIAGNOSIS — I872 Venous insufficiency (chronic) (peripheral): Secondary | ICD-10-CM | POA: Diagnosis not present

## 2022-06-09 DIAGNOSIS — H4010X0 Unspecified open-angle glaucoma, stage unspecified: Secondary | ICD-10-CM | POA: Diagnosis not present

## 2022-06-09 DIAGNOSIS — Z7902 Long term (current) use of antithrombotics/antiplatelets: Secondary | ICD-10-CM | POA: Diagnosis not present

## 2022-06-09 DIAGNOSIS — E039 Hypothyroidism, unspecified: Secondary | ICD-10-CM | POA: Diagnosis not present

## 2022-06-16 ENCOUNTER — Telehealth: Payer: Self-pay | Admitting: Hematology

## 2022-06-16 NOTE — Telephone Encounter (Signed)
Patient is aware that the appointment is rescheduled.

## 2022-06-17 DIAGNOSIS — H4010X0 Unspecified open-angle glaucoma, stage unspecified: Secondary | ICD-10-CM | POA: Diagnosis not present

## 2022-06-17 DIAGNOSIS — Z85048 Personal history of other malignant neoplasm of rectum, rectosigmoid junction, and anus: Secondary | ICD-10-CM | POA: Diagnosis not present

## 2022-06-17 DIAGNOSIS — I4891 Unspecified atrial fibrillation: Secondary | ICD-10-CM | POA: Diagnosis not present

## 2022-06-17 DIAGNOSIS — E039 Hypothyroidism, unspecified: Secondary | ICD-10-CM | POA: Diagnosis not present

## 2022-06-17 DIAGNOSIS — E785 Hyperlipidemia, unspecified: Secondary | ICD-10-CM | POA: Diagnosis not present

## 2022-06-17 DIAGNOSIS — J45909 Unspecified asthma, uncomplicated: Secondary | ICD-10-CM | POA: Diagnosis not present

## 2022-06-17 DIAGNOSIS — E1151 Type 2 diabetes mellitus with diabetic peripheral angiopathy without gangrene: Secondary | ICD-10-CM | POA: Diagnosis not present

## 2022-06-17 DIAGNOSIS — Z7902 Long term (current) use of antithrombotics/antiplatelets: Secondary | ICD-10-CM | POA: Diagnosis not present

## 2022-06-17 DIAGNOSIS — I70213 Atherosclerosis of native arteries of extremities with intermittent claudication, bilateral legs: Secondary | ICD-10-CM | POA: Diagnosis not present

## 2022-06-17 DIAGNOSIS — K219 Gastro-esophageal reflux disease without esophagitis: Secondary | ICD-10-CM | POA: Diagnosis not present

## 2022-06-17 DIAGNOSIS — Z8673 Personal history of transient ischemic attack (TIA), and cerebral infarction without residual deficits: Secondary | ICD-10-CM | POA: Diagnosis not present

## 2022-06-17 DIAGNOSIS — Z87440 Personal history of urinary (tract) infections: Secondary | ICD-10-CM | POA: Diagnosis not present

## 2022-06-17 DIAGNOSIS — M069 Rheumatoid arthritis, unspecified: Secondary | ICD-10-CM | POA: Diagnosis not present

## 2022-06-17 DIAGNOSIS — I10 Essential (primary) hypertension: Secondary | ICD-10-CM | POA: Diagnosis not present

## 2022-06-17 DIAGNOSIS — F32A Depression, unspecified: Secondary | ICD-10-CM | POA: Diagnosis not present

## 2022-06-17 DIAGNOSIS — I872 Venous insufficiency (chronic) (peripheral): Secondary | ICD-10-CM | POA: Diagnosis not present

## 2022-06-18 DIAGNOSIS — M4856XA Collapsed vertebra, not elsewhere classified, lumbar region, initial encounter for fracture: Secondary | ICD-10-CM | POA: Diagnosis not present

## 2022-06-22 ENCOUNTER — Ambulatory Visit: Payer: Medicare Other | Admitting: Neurology

## 2022-06-22 DIAGNOSIS — I2510 Atherosclerotic heart disease of native coronary artery without angina pectoris: Secondary | ICD-10-CM | POA: Diagnosis not present

## 2022-06-30 DIAGNOSIS — I739 Peripheral vascular disease, unspecified: Secondary | ICD-10-CM | POA: Diagnosis not present

## 2022-06-30 DIAGNOSIS — I10 Essential (primary) hypertension: Secondary | ICD-10-CM | POA: Diagnosis not present

## 2022-06-30 DIAGNOSIS — I480 Paroxysmal atrial fibrillation: Secondary | ICD-10-CM | POA: Diagnosis not present

## 2022-06-30 DIAGNOSIS — I2510 Atherosclerotic heart disease of native coronary artery without angina pectoris: Secondary | ICD-10-CM | POA: Diagnosis not present

## 2022-07-07 DIAGNOSIS — Z7409 Other reduced mobility: Secondary | ICD-10-CM | POA: Diagnosis not present

## 2022-07-07 DIAGNOSIS — R531 Weakness: Secondary | ICD-10-CM | POA: Diagnosis not present

## 2022-07-07 DIAGNOSIS — R2689 Other abnormalities of gait and mobility: Secondary | ICD-10-CM | POA: Diagnosis not present

## 2022-07-07 DIAGNOSIS — M48062 Spinal stenosis, lumbar region with neurogenic claudication: Secondary | ICD-10-CM | POA: Diagnosis not present

## 2022-07-08 DIAGNOSIS — M5417 Radiculopathy, lumbosacral region: Secondary | ICD-10-CM | POA: Diagnosis not present

## 2022-07-13 DIAGNOSIS — Z7409 Other reduced mobility: Secondary | ICD-10-CM | POA: Diagnosis not present

## 2022-07-13 DIAGNOSIS — R531 Weakness: Secondary | ICD-10-CM | POA: Diagnosis not present

## 2022-07-13 DIAGNOSIS — R2689 Other abnormalities of gait and mobility: Secondary | ICD-10-CM | POA: Diagnosis not present

## 2022-07-13 DIAGNOSIS — M48062 Spinal stenosis, lumbar region with neurogenic claudication: Secondary | ICD-10-CM | POA: Diagnosis not present

## 2022-07-15 ENCOUNTER — Other Ambulatory Visit: Payer: Medicare Other

## 2022-07-15 ENCOUNTER — Ambulatory Visit: Payer: Medicare Other | Admitting: Hematology

## 2022-07-26 ENCOUNTER — Other Ambulatory Visit: Payer: Self-pay | Admitting: Physician Assistant

## 2022-07-26 DIAGNOSIS — C779 Secondary and unspecified malignant neoplasm of lymph node, unspecified: Secondary | ICD-10-CM

## 2022-07-27 ENCOUNTER — Inpatient Hospital Stay (HOSPITAL_BASED_OUTPATIENT_CLINIC_OR_DEPARTMENT_OTHER): Payer: Medicare Other | Admitting: Physician Assistant

## 2022-07-27 ENCOUNTER — Ambulatory Visit (HOSPITAL_COMMUNITY)
Admission: RE | Admit: 2022-07-27 | Discharge: 2022-07-27 | Disposition: A | Discharge status: Home / Self Care | Payer: Medicare Other | Source: Ambulatory Visit | Attending: Physician Assistant | Admitting: Physician Assistant

## 2022-07-27 ENCOUNTER — Inpatient Hospital Stay: Payer: Medicare Other | Attending: Physician Assistant

## 2022-07-27 VITALS — BP 176/55 | HR 62 | Temp 97.5°F | Resp 15 | Wt 131.9 lb

## 2022-07-27 DIAGNOSIS — J8410 Pulmonary fibrosis, unspecified: Secondary | ICD-10-CM | POA: Diagnosis not present

## 2022-07-27 DIAGNOSIS — Z96611 Presence of right artificial shoulder joint: Secondary | ICD-10-CM | POA: Diagnosis not present

## 2022-07-27 DIAGNOSIS — R0781 Pleurodynia: Secondary | ICD-10-CM | POA: Insufficient documentation

## 2022-07-27 DIAGNOSIS — Z7901 Long term (current) use of anticoagulants: Secondary | ICD-10-CM | POA: Insufficient documentation

## 2022-07-27 DIAGNOSIS — C801 Malignant (primary) neoplasm, unspecified: Secondary | ICD-10-CM

## 2022-07-27 DIAGNOSIS — Z79899 Other long term (current) drug therapy: Secondary | ICD-10-CM | POA: Diagnosis not present

## 2022-07-27 DIAGNOSIS — Z9221 Personal history of antineoplastic chemotherapy: Secondary | ICD-10-CM | POA: Insufficient documentation

## 2022-07-27 DIAGNOSIS — C779 Secondary and unspecified malignant neoplasm of lymph node, unspecified: Secondary | ICD-10-CM

## 2022-07-27 DIAGNOSIS — Z923 Personal history of irradiation: Secondary | ICD-10-CM | POA: Insufficient documentation

## 2022-07-27 DIAGNOSIS — Z85048 Personal history of other malignant neoplasm of rectum, rectosigmoid junction, and anus: Secondary | ICD-10-CM | POA: Diagnosis not present

## 2022-07-27 DIAGNOSIS — Z7902 Long term (current) use of antithrombotics/antiplatelets: Secondary | ICD-10-CM | POA: Insufficient documentation

## 2022-07-27 DIAGNOSIS — R630 Anorexia: Secondary | ICD-10-CM | POA: Diagnosis not present

## 2022-07-27 LAB — CBC WITH DIFFERENTIAL (CANCER CENTER ONLY)
Abs Immature Granulocytes: 0.04 10*3/uL (ref 0.00–0.07)
Basophils Absolute: 0 10*3/uL (ref 0.0–0.1)
Basophils Relative: 0 %
Eosinophils Absolute: 0.1 10*3/uL (ref 0.0–0.5)
Eosinophils Relative: 3 %
HCT: 42.5 % (ref 36.0–46.0)
Hemoglobin: 13.8 g/dL (ref 12.0–15.0)
Immature Granulocytes: 1 %
Lymphocytes Relative: 25 %
Lymphs Abs: 1.1 10*3/uL (ref 0.7–4.0)
MCH: 31.3 pg (ref 26.0–34.0)
MCHC: 32.5 g/dL (ref 30.0–36.0)
MCV: 96.4 fL (ref 80.0–100.0)
Monocytes Absolute: 0.5 10*3/uL (ref 0.1–1.0)
Monocytes Relative: 11 %
Neutro Abs: 2.7 10*3/uL (ref 1.7–7.7)
Neutrophils Relative %: 60 %
Platelet Count: 189 10*3/uL (ref 150–400)
RBC: 4.41 MIL/uL (ref 3.87–5.11)
RDW: 14.1 % (ref 11.5–15.5)
WBC Count: 4.5 10*3/uL (ref 4.0–10.5)
nRBC: 0 % (ref 0.0–0.2)

## 2022-07-27 LAB — CMP (CANCER CENTER ONLY)
ALT: 18 U/L (ref 0–44)
AST: 25 U/L (ref 15–41)
Albumin: 3.9 g/dL (ref 3.5–5.0)
Alkaline Phosphatase: 70 U/L (ref 38–126)
Anion gap: 7 (ref 5–15)
BUN: 11 mg/dL (ref 8–23)
CO2: 26 mmol/L (ref 22–32)
Calcium: 9.4 mg/dL (ref 8.9–10.3)
Chloride: 108 mmol/L (ref 98–111)
Creatinine: 0.99 mg/dL (ref 0.44–1.00)
GFR, Estimated: 55 mL/min — ABNORMAL LOW (ref >60)
Glucose, Bld: 96 mg/dL (ref 70–99)
Potassium: 4.4 mmol/L (ref 3.5–5.1)
Sodium: 141 mmol/L (ref 135–145)
Total Bilirubin: 0.4 mg/dL (ref 0.3–1.2)
Total Protein: 7.3 g/dL (ref 6.5–8.1)

## 2022-07-27 MED ORDER — MEGESTROL ACETATE 400 MG/10ML PO SUSP: 400.0000 mg | Freq: Every day | ORAL | 3 refills | Status: DC

## 2022-07-29 ENCOUNTER — Encounter: Payer: Self-pay | Admitting: Hematology

## 2022-08-02 ENCOUNTER — Telehealth: Payer: Self-pay

## 2022-08-02 DIAGNOSIS — C210 Malignant neoplasm of anus, unspecified: Secondary | ICD-10-CM | POA: Diagnosis not present

## 2022-08-02 NOTE — Telephone Encounter (Signed)
Pt advised of Xray results with VU.    She is no longer having any pain.

## 2022-08-02 NOTE — Telephone Encounter (Signed)
-----   Message from Briant Cedar sent at 08/02/2022 11:30 AM EDT ----- Please notify patient the xray was negative. Please check if her rib pain is any better/worse?

## 2022-08-04 DIAGNOSIS — R531 Weakness: Secondary | ICD-10-CM | POA: Diagnosis not present

## 2022-08-04 DIAGNOSIS — R2689 Other abnormalities of gait and mobility: Secondary | ICD-10-CM | POA: Diagnosis not present

## 2022-08-04 DIAGNOSIS — Z7409 Other reduced mobility: Secondary | ICD-10-CM | POA: Diagnosis not present

## 2022-08-04 DIAGNOSIS — M48062 Spinal stenosis, lumbar region with neurogenic claudication: Secondary | ICD-10-CM | POA: Diagnosis not present

## 2022-08-05 DIAGNOSIS — M5416 Radiculopathy, lumbar region: Secondary | ICD-10-CM | POA: Diagnosis not present

## 2022-08-05 DIAGNOSIS — M533 Sacrococcygeal disorders, not elsewhere classified: Secondary | ICD-10-CM | POA: Diagnosis not present

## 2022-08-05 DIAGNOSIS — M48062 Spinal stenosis, lumbar region with neurogenic claudication: Secondary | ICD-10-CM | POA: Diagnosis not present

## 2022-08-05 DIAGNOSIS — G8929 Other chronic pain: Secondary | ICD-10-CM | POA: Diagnosis not present

## 2022-08-05 DIAGNOSIS — M25561 Pain in right knee: Secondary | ICD-10-CM | POA: Diagnosis not present

## 2022-08-05 DIAGNOSIS — M25562 Pain in left knee: Secondary | ICD-10-CM | POA: Diagnosis not present

## 2022-08-06 DIAGNOSIS — R2689 Other abnormalities of gait and mobility: Secondary | ICD-10-CM | POA: Diagnosis not present

## 2022-08-06 DIAGNOSIS — R531 Weakness: Secondary | ICD-10-CM | POA: Diagnosis not present

## 2022-08-06 DIAGNOSIS — Z7409 Other reduced mobility: Secondary | ICD-10-CM | POA: Diagnosis not present

## 2022-08-06 DIAGNOSIS — M48062 Spinal stenosis, lumbar region with neurogenic claudication: Secondary | ICD-10-CM | POA: Diagnosis not present

## 2022-08-12 DIAGNOSIS — R569 Unspecified convulsions: Secondary | ICD-10-CM | POA: Diagnosis not present

## 2022-08-13 DIAGNOSIS — Z7409 Other reduced mobility: Secondary | ICD-10-CM | POA: Diagnosis not present

## 2022-08-13 DIAGNOSIS — R531 Weakness: Secondary | ICD-10-CM | POA: Diagnosis not present

## 2022-08-13 DIAGNOSIS — M48062 Spinal stenosis, lumbar region with neurogenic claudication: Secondary | ICD-10-CM | POA: Diagnosis not present

## 2022-08-13 DIAGNOSIS — R2689 Other abnormalities of gait and mobility: Secondary | ICD-10-CM | POA: Diagnosis not present

## 2022-08-16 DIAGNOSIS — R918 Other nonspecific abnormal finding of lung field: Secondary | ICD-10-CM | POA: Diagnosis not present

## 2022-08-16 DIAGNOSIS — J4520 Mild intermittent asthma, uncomplicated: Secondary | ICD-10-CM | POA: Diagnosis not present

## 2022-08-16 DIAGNOSIS — R0609 Other forms of dyspnea: Secondary | ICD-10-CM | POA: Diagnosis not present

## 2022-08-16 DIAGNOSIS — R059 Cough, unspecified: Secondary | ICD-10-CM | POA: Diagnosis not present

## 2022-08-17 DIAGNOSIS — R531 Weakness: Secondary | ICD-10-CM | POA: Diagnosis not present

## 2022-08-17 DIAGNOSIS — R2689 Other abnormalities of gait and mobility: Secondary | ICD-10-CM | POA: Diagnosis not present

## 2022-08-17 DIAGNOSIS — Z7409 Other reduced mobility: Secondary | ICD-10-CM | POA: Diagnosis not present

## 2022-08-17 DIAGNOSIS — M48062 Spinal stenosis, lumbar region with neurogenic claudication: Secondary | ICD-10-CM | POA: Diagnosis not present

## 2022-08-20 DIAGNOSIS — R2689 Other abnormalities of gait and mobility: Secondary | ICD-10-CM | POA: Diagnosis not present

## 2022-08-20 DIAGNOSIS — Z7409 Other reduced mobility: Secondary | ICD-10-CM | POA: Diagnosis not present

## 2022-08-20 DIAGNOSIS — M48062 Spinal stenosis, lumbar region with neurogenic claudication: Secondary | ICD-10-CM | POA: Diagnosis not present

## 2022-08-20 DIAGNOSIS — R531 Weakness: Secondary | ICD-10-CM | POA: Diagnosis not present

## 2022-08-24 DIAGNOSIS — M48062 Spinal stenosis, lumbar region with neurogenic claudication: Secondary | ICD-10-CM | POA: Diagnosis not present

## 2022-08-24 DIAGNOSIS — R531 Weakness: Secondary | ICD-10-CM | POA: Diagnosis not present

## 2022-08-24 DIAGNOSIS — R2689 Other abnormalities of gait and mobility: Secondary | ICD-10-CM | POA: Diagnosis not present

## 2022-08-24 DIAGNOSIS — Z7409 Other reduced mobility: Secondary | ICD-10-CM | POA: Diagnosis not present

## 2022-09-02 ENCOUNTER — Encounter: Payer: Self-pay | Admitting: Hematology

## 2022-09-03 ENCOUNTER — Encounter: Payer: Self-pay | Admitting: Hematology

## 2022-09-07 DIAGNOSIS — M25561 Pain in right knee: Secondary | ICD-10-CM | POA: Diagnosis not present

## 2022-09-07 DIAGNOSIS — M7062 Trochanteric bursitis, left hip: Secondary | ICD-10-CM | POA: Diagnosis not present

## 2022-09-07 DIAGNOSIS — M5416 Radiculopathy, lumbar region: Secondary | ICD-10-CM | POA: Diagnosis not present

## 2022-09-07 DIAGNOSIS — M48062 Spinal stenosis, lumbar region with neurogenic claudication: Secondary | ICD-10-CM | POA: Diagnosis not present

## 2022-09-07 DIAGNOSIS — M7061 Trochanteric bursitis, right hip: Secondary | ICD-10-CM | POA: Diagnosis not present

## 2022-09-07 DIAGNOSIS — M25562 Pain in left knee: Secondary | ICD-10-CM | POA: Diagnosis not present

## 2022-09-07 DIAGNOSIS — G8929 Other chronic pain: Secondary | ICD-10-CM | POA: Diagnosis not present

## 2022-09-07 DIAGNOSIS — M533 Sacrococcygeal disorders, not elsewhere classified: Secondary | ICD-10-CM | POA: Diagnosis not present

## 2022-09-07 DIAGNOSIS — M25551 Pain in right hip: Secondary | ICD-10-CM | POA: Diagnosis not present

## 2022-09-07 DIAGNOSIS — Z79899 Other long term (current) drug therapy: Secondary | ICD-10-CM | POA: Diagnosis not present

## 2022-09-08 DIAGNOSIS — R918 Other nonspecific abnormal finding of lung field: Secondary | ICD-10-CM | POA: Diagnosis not present

## 2022-09-24 DIAGNOSIS — R140 Abdominal distension (gaseous): Secondary | ICD-10-CM | POA: Diagnosis not present

## 2022-09-30 DIAGNOSIS — J069 Acute upper respiratory infection, unspecified: Secondary | ICD-10-CM | POA: Diagnosis not present

## 2022-10-04 DIAGNOSIS — Z955 Presence of coronary angioplasty implant and graft: Secondary | ICD-10-CM | POA: Diagnosis not present

## 2022-10-04 DIAGNOSIS — E872 Acidosis, unspecified: Secondary | ICD-10-CM | POA: Diagnosis not present

## 2022-10-04 DIAGNOSIS — E782 Mixed hyperlipidemia: Secondary | ICD-10-CM | POA: Diagnosis not present

## 2022-10-04 DIAGNOSIS — I5032 Chronic diastolic (congestive) heart failure: Secondary | ICD-10-CM | POA: Diagnosis not present

## 2022-10-04 DIAGNOSIS — R9431 Abnormal electrocardiogram [ECG] [EKG]: Secondary | ICD-10-CM | POA: Diagnosis not present

## 2022-10-04 DIAGNOSIS — R739 Hyperglycemia, unspecified: Secondary | ICD-10-CM | POA: Diagnosis not present

## 2022-10-04 DIAGNOSIS — Z8673 Personal history of transient ischemic attack (TIA), and cerebral infarction without residual deficits: Secondary | ICD-10-CM | POA: Diagnosis not present

## 2022-10-04 DIAGNOSIS — E039 Hypothyroidism, unspecified: Secondary | ICD-10-CM | POA: Diagnosis not present

## 2022-10-04 DIAGNOSIS — Z91148 Patient's other noncompliance with medication regimen for other reason: Secondary | ICD-10-CM | POA: Diagnosis not present

## 2022-10-04 DIAGNOSIS — I4811 Longstanding persistent atrial fibrillation: Secondary | ICD-10-CM | POA: Diagnosis not present

## 2022-10-04 DIAGNOSIS — I499 Cardiac arrhythmia, unspecified: Secondary | ICD-10-CM | POA: Diagnosis not present

## 2022-10-04 DIAGNOSIS — Z7901 Long term (current) use of anticoagulants: Secondary | ICD-10-CM | POA: Diagnosis not present

## 2022-10-04 DIAGNOSIS — J329 Chronic sinusitis, unspecified: Secondary | ICD-10-CM | POA: Diagnosis not present

## 2022-10-04 DIAGNOSIS — R930 Abnormal findings on diagnostic imaging of skull and head, not elsewhere classified: Secondary | ICD-10-CM | POA: Diagnosis not present

## 2022-10-04 DIAGNOSIS — J9602 Acute respiratory failure with hypercapnia: Secondary | ICD-10-CM | POA: Diagnosis not present

## 2022-10-04 DIAGNOSIS — R404 Transient alteration of awareness: Secondary | ICD-10-CM | POA: Diagnosis not present

## 2022-10-04 DIAGNOSIS — Z79899 Other long term (current) drug therapy: Secondary | ICD-10-CM | POA: Diagnosis not present

## 2022-10-04 DIAGNOSIS — I2510 Atherosclerotic heart disease of native coronary artery without angina pectoris: Secondary | ICD-10-CM | POA: Diagnosis not present

## 2022-10-04 DIAGNOSIS — I6781 Acute cerebrovascular insufficiency: Secondary | ICD-10-CM | POA: Diagnosis not present

## 2022-10-04 DIAGNOSIS — E1165 Type 2 diabetes mellitus with hyperglycemia: Secondary | ICD-10-CM | POA: Diagnosis not present

## 2022-10-04 DIAGNOSIS — E119 Type 2 diabetes mellitus without complications: Secondary | ICD-10-CM | POA: Diagnosis not present

## 2022-10-04 DIAGNOSIS — Z9911 Dependence on respirator [ventilator] status: Secondary | ICD-10-CM | POA: Diagnosis not present

## 2022-10-04 DIAGNOSIS — Z743 Need for continuous supervision: Secondary | ICD-10-CM | POA: Diagnosis not present

## 2022-10-04 DIAGNOSIS — Z85048 Personal history of other malignant neoplasm of rectum, rectosigmoid junction, and anus: Secondary | ICD-10-CM | POA: Diagnosis not present

## 2022-10-04 DIAGNOSIS — R9401 Abnormal electroencephalogram [EEG]: Secondary | ICD-10-CM | POA: Diagnosis not present

## 2022-10-04 DIAGNOSIS — Z4659 Encounter for fitting and adjustment of other gastrointestinal appliance and device: Secondary | ICD-10-CM | POA: Diagnosis not present

## 2022-10-04 DIAGNOSIS — G3184 Mild cognitive impairment, so stated: Secondary | ICD-10-CM | POA: Diagnosis not present

## 2022-10-04 DIAGNOSIS — R258 Other abnormal involuntary movements: Secondary | ICD-10-CM | POA: Diagnosis not present

## 2022-10-04 DIAGNOSIS — R531 Weakness: Secondary | ICD-10-CM | POA: Diagnosis not present

## 2022-10-04 DIAGNOSIS — I110 Hypertensive heart disease with heart failure: Secondary | ICD-10-CM | POA: Diagnosis not present

## 2022-10-04 DIAGNOSIS — R6889 Other general symptoms and signs: Secondary | ICD-10-CM | POA: Diagnosis not present

## 2022-10-07 DIAGNOSIS — I499 Cardiac arrhythmia, unspecified: Secondary | ICD-10-CM | POA: Diagnosis not present

## 2022-10-14 DIAGNOSIS — J4520 Mild intermittent asthma, uncomplicated: Secondary | ICD-10-CM | POA: Diagnosis not present

## 2022-10-15 DIAGNOSIS — J8410 Pulmonary fibrosis, unspecified: Secondary | ICD-10-CM | POA: Diagnosis not present

## 2022-10-15 DIAGNOSIS — R918 Other nonspecific abnormal finding of lung field: Secondary | ICD-10-CM | POA: Diagnosis not present

## 2022-10-15 DIAGNOSIS — K551 Chronic vascular disorders of intestine: Secondary | ICD-10-CM | POA: Diagnosis not present

## 2022-10-15 DIAGNOSIS — J4520 Mild intermittent asthma, uncomplicated: Secondary | ICD-10-CM | POA: Diagnosis not present

## 2022-10-23 DIAGNOSIS — R918 Other nonspecific abnormal finding of lung field: Secondary | ICD-10-CM | POA: Diagnosis not present

## 2022-10-23 DIAGNOSIS — J8410 Pulmonary fibrosis, unspecified: Secondary | ICD-10-CM | POA: Diagnosis not present

## 2022-11-01 DIAGNOSIS — R531 Weakness: Secondary | ICD-10-CM | POA: Diagnosis not present

## 2022-11-01 DIAGNOSIS — R569 Unspecified convulsions: Secondary | ICD-10-CM | POA: Diagnosis not present

## 2022-11-01 DIAGNOSIS — Z7409 Other reduced mobility: Secondary | ICD-10-CM | POA: Diagnosis not present

## 2022-11-01 DIAGNOSIS — R2689 Other abnormalities of gait and mobility: Secondary | ICD-10-CM | POA: Diagnosis not present

## 2022-11-01 DIAGNOSIS — M48062 Spinal stenosis, lumbar region with neurogenic claudication: Secondary | ICD-10-CM | POA: Diagnosis not present

## 2022-11-02 DIAGNOSIS — Z20822 Contact with and (suspected) exposure to covid-19: Secondary | ICD-10-CM | POA: Diagnosis not present

## 2022-11-02 DIAGNOSIS — N3000 Acute cystitis without hematuria: Secondary | ICD-10-CM | POA: Diagnosis not present

## 2022-11-02 DIAGNOSIS — R509 Fever, unspecified: Secondary | ICD-10-CM | POA: Diagnosis not present

## 2022-11-03 DIAGNOSIS — C210 Malignant neoplasm of anus, unspecified: Secondary | ICD-10-CM | POA: Diagnosis not present

## 2022-11-03 DIAGNOSIS — N3000 Acute cystitis without hematuria: Secondary | ICD-10-CM | POA: Diagnosis not present

## 2022-11-10 DIAGNOSIS — M48062 Spinal stenosis, lumbar region with neurogenic claudication: Secondary | ICD-10-CM | POA: Diagnosis not present

## 2022-11-10 DIAGNOSIS — R531 Weakness: Secondary | ICD-10-CM | POA: Diagnosis not present

## 2022-11-10 DIAGNOSIS — Z7409 Other reduced mobility: Secondary | ICD-10-CM | POA: Diagnosis not present

## 2022-11-10 DIAGNOSIS — R2689 Other abnormalities of gait and mobility: Secondary | ICD-10-CM | POA: Diagnosis not present

## 2022-11-10 DIAGNOSIS — R569 Unspecified convulsions: Secondary | ICD-10-CM | POA: Diagnosis not present

## 2022-11-11 DIAGNOSIS — E1169 Type 2 diabetes mellitus with other specified complication: Secondary | ICD-10-CM | POA: Diagnosis not present

## 2022-11-11 DIAGNOSIS — E7800 Pure hypercholesterolemia, unspecified: Secondary | ICD-10-CM | POA: Diagnosis not present

## 2022-11-11 DIAGNOSIS — D6869 Other thrombophilia: Secondary | ICD-10-CM | POA: Diagnosis not present

## 2022-11-11 DIAGNOSIS — E559 Vitamin D deficiency, unspecified: Secondary | ICD-10-CM | POA: Diagnosis not present

## 2022-11-11 DIAGNOSIS — Z23 Encounter for immunization: Secondary | ICD-10-CM | POA: Diagnosis not present

## 2022-11-11 DIAGNOSIS — M069 Rheumatoid arthritis, unspecified: Secondary | ICD-10-CM | POA: Diagnosis not present

## 2022-11-11 DIAGNOSIS — I25119 Atherosclerotic heart disease of native coronary artery with unspecified angina pectoris: Secondary | ICD-10-CM | POA: Diagnosis not present

## 2022-11-11 DIAGNOSIS — E1151 Type 2 diabetes mellitus with diabetic peripheral angiopathy without gangrene: Secondary | ICD-10-CM | POA: Diagnosis not present

## 2022-11-11 DIAGNOSIS — E1142 Type 2 diabetes mellitus with diabetic polyneuropathy: Secondary | ICD-10-CM | POA: Diagnosis not present

## 2022-11-11 DIAGNOSIS — R52 Pain, unspecified: Secondary | ICD-10-CM | POA: Diagnosis not present

## 2022-11-11 DIAGNOSIS — I700 Atherosclerosis of aorta: Secondary | ICD-10-CM | POA: Diagnosis not present

## 2022-11-11 DIAGNOSIS — E039 Hypothyroidism, unspecified: Secondary | ICD-10-CM | POA: Diagnosis not present

## 2022-11-11 DIAGNOSIS — I4891 Unspecified atrial fibrillation: Secondary | ICD-10-CM | POA: Diagnosis not present

## 2022-11-16 DIAGNOSIS — Z760 Encounter for issue of repeat prescription: Secondary | ICD-10-CM | POA: Diagnosis not present

## 2022-11-16 DIAGNOSIS — M5416 Radiculopathy, lumbar region: Secondary | ICD-10-CM | POA: Diagnosis not present

## 2022-11-16 DIAGNOSIS — Z79899 Other long term (current) drug therapy: Secondary | ICD-10-CM | POA: Diagnosis not present

## 2022-11-16 DIAGNOSIS — M533 Sacrococcygeal disorders, not elsewhere classified: Secondary | ICD-10-CM | POA: Diagnosis not present

## 2022-11-17 DIAGNOSIS — M48062 Spinal stenosis, lumbar region with neurogenic claudication: Secondary | ICD-10-CM | POA: Diagnosis not present

## 2022-11-17 DIAGNOSIS — Z7409 Other reduced mobility: Secondary | ICD-10-CM | POA: Diagnosis not present

## 2022-11-17 DIAGNOSIS — R531 Weakness: Secondary | ICD-10-CM | POA: Diagnosis not present

## 2022-11-17 DIAGNOSIS — R2689 Other abnormalities of gait and mobility: Secondary | ICD-10-CM | POA: Diagnosis not present

## 2022-11-17 DIAGNOSIS — R569 Unspecified convulsions: Secondary | ICD-10-CM | POA: Diagnosis not present

## 2022-11-18 DIAGNOSIS — Z7409 Other reduced mobility: Secondary | ICD-10-CM | POA: Diagnosis not present

## 2022-11-18 DIAGNOSIS — M48062 Spinal stenosis, lumbar region with neurogenic claudication: Secondary | ICD-10-CM | POA: Diagnosis not present

## 2022-11-18 DIAGNOSIS — R569 Unspecified convulsions: Secondary | ICD-10-CM | POA: Diagnosis not present

## 2022-11-18 DIAGNOSIS — R531 Weakness: Secondary | ICD-10-CM | POA: Diagnosis not present

## 2022-11-18 DIAGNOSIS — R2689 Other abnormalities of gait and mobility: Secondary | ICD-10-CM | POA: Diagnosis not present

## 2022-11-21 DIAGNOSIS — S199XXA Unspecified injury of neck, initial encounter: Secondary | ICD-10-CM | POA: Diagnosis not present

## 2022-11-21 DIAGNOSIS — I4891 Unspecified atrial fibrillation: Secondary | ICD-10-CM | POA: Diagnosis not present

## 2022-11-21 DIAGNOSIS — E041 Nontoxic single thyroid nodule: Secondary | ICD-10-CM | POA: Diagnosis not present

## 2022-11-21 DIAGNOSIS — M79642 Pain in left hand: Secondary | ICD-10-CM | POA: Diagnosis not present

## 2022-11-21 DIAGNOSIS — Z043 Encounter for examination and observation following other accident: Secondary | ICD-10-CM | POA: Diagnosis not present

## 2022-11-21 DIAGNOSIS — S299XXA Unspecified injury of thorax, initial encounter: Secondary | ICD-10-CM | POA: Diagnosis not present

## 2022-11-21 DIAGNOSIS — Z7901 Long term (current) use of anticoagulants: Secondary | ICD-10-CM | POA: Diagnosis not present

## 2022-11-21 DIAGNOSIS — S0181XA Laceration without foreign body of other part of head, initial encounter: Secondary | ICD-10-CM | POA: Diagnosis not present

## 2022-11-21 DIAGNOSIS — M25562 Pain in left knee: Secondary | ICD-10-CM | POA: Diagnosis not present

## 2022-11-21 DIAGNOSIS — M545 Low back pain, unspecified: Secondary | ICD-10-CM | POA: Diagnosis not present

## 2022-11-21 DIAGNOSIS — M546 Pain in thoracic spine: Secondary | ICD-10-CM | POA: Diagnosis not present

## 2022-11-21 DIAGNOSIS — Z23 Encounter for immunization: Secondary | ICD-10-CM | POA: Diagnosis not present

## 2022-11-21 DIAGNOSIS — K760 Fatty (change of) liver, not elsewhere classified: Secondary | ICD-10-CM | POA: Diagnosis not present

## 2022-11-21 DIAGNOSIS — I517 Cardiomegaly: Secondary | ICD-10-CM | POA: Diagnosis not present

## 2022-11-21 DIAGNOSIS — S0990XA Unspecified injury of head, initial encounter: Secondary | ICD-10-CM | POA: Diagnosis not present

## 2022-11-21 DIAGNOSIS — S01511A Laceration without foreign body of lip, initial encounter: Secondary | ICD-10-CM | POA: Diagnosis not present

## 2022-11-22 DIAGNOSIS — S0181XD Laceration without foreign body of other part of head, subsequent encounter: Secondary | ICD-10-CM | POA: Diagnosis not present

## 2022-11-22 DIAGNOSIS — W19XXXA Unspecified fall, initial encounter: Secondary | ICD-10-CM | POA: Diagnosis not present

## 2022-11-23 DIAGNOSIS — K551 Chronic vascular disorders of intestine: Secondary | ICD-10-CM | POA: Diagnosis not present

## 2022-11-23 DIAGNOSIS — I700 Atherosclerosis of aorta: Secondary | ICD-10-CM | POA: Diagnosis not present

## 2022-11-29 DIAGNOSIS — M549 Dorsalgia, unspecified: Secondary | ICD-10-CM | POA: Diagnosis not present

## 2022-11-29 DIAGNOSIS — M542 Cervicalgia: Secondary | ICD-10-CM | POA: Diagnosis not present

## 2022-11-29 DIAGNOSIS — S0181XD Laceration without foreign body of other part of head, subsequent encounter: Secondary | ICD-10-CM | POA: Diagnosis not present

## 2022-11-29 DIAGNOSIS — Z9181 History of falling: Secondary | ICD-10-CM | POA: Diagnosis not present

## 2022-12-07 DIAGNOSIS — R41841 Cognitive communication deficit: Secondary | ICD-10-CM | POA: Diagnosis not present

## 2022-12-07 DIAGNOSIS — R569 Unspecified convulsions: Secondary | ICD-10-CM | POA: Diagnosis not present

## 2022-12-07 DIAGNOSIS — M48062 Spinal stenosis, lumbar region with neurogenic claudication: Secondary | ICD-10-CM | POA: Diagnosis not present

## 2022-12-07 DIAGNOSIS — R531 Weakness: Secondary | ICD-10-CM | POA: Diagnosis not present

## 2022-12-07 DIAGNOSIS — R2689 Other abnormalities of gait and mobility: Secondary | ICD-10-CM | POA: Diagnosis not present

## 2022-12-07 DIAGNOSIS — Z7409 Other reduced mobility: Secondary | ICD-10-CM | POA: Diagnosis not present

## 2022-12-25 ENCOUNTER — Encounter: Payer: Self-pay | Admitting: Hematology

## 2022-12-28 DIAGNOSIS — Z7901 Long term (current) use of anticoagulants: Secondary | ICD-10-CM | POA: Diagnosis not present

## 2022-12-28 DIAGNOSIS — Z79899 Other long term (current) drug therapy: Secondary | ICD-10-CM | POA: Diagnosis not present

## 2022-12-28 DIAGNOSIS — Z7409 Other reduced mobility: Secondary | ICD-10-CM | POA: Diagnosis not present

## 2022-12-28 DIAGNOSIS — M48062 Spinal stenosis, lumbar region with neurogenic claudication: Secondary | ICD-10-CM | POA: Diagnosis not present

## 2022-12-28 DIAGNOSIS — R531 Weakness: Secondary | ICD-10-CM | POA: Diagnosis not present

## 2022-12-28 DIAGNOSIS — M5416 Radiculopathy, lumbar region: Secondary | ICD-10-CM | POA: Diagnosis not present

## 2022-12-28 DIAGNOSIS — R2689 Other abnormalities of gait and mobility: Secondary | ICD-10-CM | POA: Diagnosis not present

## 2022-12-28 DIAGNOSIS — M533 Sacrococcygeal disorders, not elsewhere classified: Secondary | ICD-10-CM | POA: Diagnosis not present

## 2022-12-28 DIAGNOSIS — R569 Unspecified convulsions: Secondary | ICD-10-CM | POA: Diagnosis not present

## 2022-12-28 DIAGNOSIS — R41841 Cognitive communication deficit: Secondary | ICD-10-CM | POA: Diagnosis not present

## 2022-12-30 DIAGNOSIS — R41841 Cognitive communication deficit: Secondary | ICD-10-CM | POA: Diagnosis not present

## 2022-12-30 DIAGNOSIS — M48062 Spinal stenosis, lumbar region with neurogenic claudication: Secondary | ICD-10-CM | POA: Diagnosis not present

## 2022-12-30 DIAGNOSIS — R531 Weakness: Secondary | ICD-10-CM | POA: Diagnosis not present

## 2022-12-30 DIAGNOSIS — R569 Unspecified convulsions: Secondary | ICD-10-CM | POA: Diagnosis not present

## 2022-12-30 DIAGNOSIS — Z7409 Other reduced mobility: Secondary | ICD-10-CM | POA: Diagnosis not present

## 2022-12-30 DIAGNOSIS — R2689 Other abnormalities of gait and mobility: Secondary | ICD-10-CM | POA: Diagnosis not present

## 2023-01-04 DIAGNOSIS — R531 Weakness: Secondary | ICD-10-CM | POA: Diagnosis not present

## 2023-01-04 DIAGNOSIS — R41841 Cognitive communication deficit: Secondary | ICD-10-CM | POA: Diagnosis not present

## 2023-01-04 DIAGNOSIS — Z7409 Other reduced mobility: Secondary | ICD-10-CM | POA: Diagnosis not present

## 2023-01-04 DIAGNOSIS — R2689 Other abnormalities of gait and mobility: Secondary | ICD-10-CM | POA: Diagnosis not present

## 2023-01-04 DIAGNOSIS — R569 Unspecified convulsions: Secondary | ICD-10-CM | POA: Diagnosis not present

## 2023-01-04 DIAGNOSIS — M48062 Spinal stenosis, lumbar region with neurogenic claudication: Secondary | ICD-10-CM | POA: Diagnosis not present

## 2023-01-06 DIAGNOSIS — Z7409 Other reduced mobility: Secondary | ICD-10-CM | POA: Diagnosis not present

## 2023-01-06 DIAGNOSIS — R569 Unspecified convulsions: Secondary | ICD-10-CM | POA: Diagnosis not present

## 2023-01-06 DIAGNOSIS — R531 Weakness: Secondary | ICD-10-CM | POA: Diagnosis not present

## 2023-01-06 DIAGNOSIS — R41841 Cognitive communication deficit: Secondary | ICD-10-CM | POA: Diagnosis not present

## 2023-01-06 DIAGNOSIS — M48062 Spinal stenosis, lumbar region with neurogenic claudication: Secondary | ICD-10-CM | POA: Diagnosis not present

## 2023-01-06 DIAGNOSIS — R2689 Other abnormalities of gait and mobility: Secondary | ICD-10-CM | POA: Diagnosis not present

## 2023-01-14 ENCOUNTER — Encounter: Payer: Self-pay | Admitting: Hematology

## 2023-01-18 DIAGNOSIS — R569 Unspecified convulsions: Secondary | ICD-10-CM | POA: Diagnosis not present

## 2023-01-18 DIAGNOSIS — R2689 Other abnormalities of gait and mobility: Secondary | ICD-10-CM | POA: Diagnosis not present

## 2023-01-18 DIAGNOSIS — Z7409 Other reduced mobility: Secondary | ICD-10-CM | POA: Diagnosis not present

## 2023-01-18 DIAGNOSIS — R531 Weakness: Secondary | ICD-10-CM | POA: Diagnosis not present

## 2023-01-18 DIAGNOSIS — R41841 Cognitive communication deficit: Secondary | ICD-10-CM | POA: Diagnosis not present

## 2023-01-18 DIAGNOSIS — M48062 Spinal stenosis, lumbar region with neurogenic claudication: Secondary | ICD-10-CM | POA: Diagnosis not present

## 2023-01-21 ENCOUNTER — Telehealth: Payer: Self-pay | Admitting: Hematology

## 2023-01-24 ENCOUNTER — Inpatient Hospital Stay: Payer: Medicare PPO | Admitting: Hematology

## 2023-01-24 ENCOUNTER — Inpatient Hospital Stay: Payer: Medicare PPO

## 2023-01-24 DIAGNOSIS — M533 Sacrococcygeal disorders, not elsewhere classified: Secondary | ICD-10-CM | POA: Diagnosis not present

## 2023-01-24 NOTE — Progress Notes (Deleted)
 Patient Care Team: Seabron Lenis, MD as PCP - General (Family Medicine) Georjean Darice HERO, MD as Consulting Physician (Neurology) Lanny Callander, MD as Consulting Physician (Oncology) Detra Darice LABOR, RN (Inactive) as Oncology Nurse Navigator (Oncology)  Clinic Day:  01/24/2023  Referring physician: Seabron Lenis, MD  ASSESSMENT & PLAN:   Assessment & Plan: No problem-specific Assessment & Plan notes found for this encounter.    The patient understands the plans discussed today and is in agreement with them.  She knows to contact our office if she develops concerns prior to her next appointment.  I provided *** minutes of face-to-face time during this encounter and > 50% was spent counseling as documented under my assessment and plan.    Powell FORBES Lessen, NP   CANCER CENTER [facility] - A DEPT OF [facility] [address] [place] [address] Dept: [phone] Dept Fax: [phone]   No orders of the defined types were placed in this encounter.     CHIEF COMPLAINT:  CC: f/u anal cancer   Current Treatment:  surveillance   INTERVAL HISTORY:  Abimbola is here today for repeat clinical assessment. She was last seen 07/27/2022 by Johnston, [place].  She was having improved appetite while taking megace . She has restaging CT CAP scheduled for 01/28/2023. She denies fevers or chills. She denies pain. Her appetite is good. Her weight {Weight change:10426}.  I have reviewed the past medical history, past surgical history, social history and family history with the patient and they are unchanged from previous note.  ALLERGIES:  is allergic to ibuprofen.  MEDICATIONS:  Current Outpatient Medications  Medication Sig Dispense Refill   ACCU-CHEK AVIVA PLUS test strip 1 each as directed.     Accu-Chek Softclix Lancets lancets 1 each by Other route as directed.     ACETAMINOPHEN  8 HOUR PO Take 1 tablet by mouth as needed.     albuterol  (PROVENTIL) (2.5 MG/3ML) 0.083% nebulizer solution SMARTSIG:1 Vial(s) Via Nebulizer Every 4-6 Hours PRN     Alcohol Swabs (ALCOHOL WIPES) 70 % PADS DropSafe Alcohol Prep Pads     amiodarone  (PACERONE ) 100 MG tablet Take 100 mg by mouth daily.     Blood Glucose Calibration (ACCU-CHEK AVIVA) SOLN      Blood Glucose Monitoring Suppl (TRUE METRIX AIR GLUCOSE METER) w/Device KIT See admin instructions.     clopidogrel  (PLAVIX ) 75 MG tablet Take 1 tablet (75 mg total) by mouth daily. 90 tablet 3   ELIQUIS  2.5 MG TABS tablet Take 2.5 mg by mouth 2 (two) times daily.     escitalopram  (LEXAPRO ) 10 MG tablet Take 10 mg by mouth daily.     gabapentin  (NEURONTIN ) 100 MG capsule Take 100 mg by mouth 3 (three) times daily.     levalbuterol  (XOPENEX ) 1.25 MG/3ML nebulizer solution Inhale into the lungs.     levETIRAcetam  (KEPPRA ) 500 MG tablet Take 1 tablet (500 mg total) by mouth 2 (two) times daily. 180 tablet 3   megestrol  (MEGACE ) 400 MG/10ML suspension Take 10 mLs (400 mg total) by mouth daily. 240 mL 3   nitroGLYCERIN (NITROSTAT) 0.4 MG SL tablet Place 0.4 mg under the tongue every 5 (five) minutes as needed for chest pain.     polyethylene glycol powder (GLYCOLAX /MIRALAX ) 17 GM/SCOOP powder Take by mouth.     rosuvastatin (CRESTOR) 5 MG tablet Take by mouth.     TRELEGY ELLIPTA 200-62.5-25 MCG/ACT AEPB Take 1 puff by mouth  daily.     No current facility-administered medications for this visit.    HISTORY OF PRESENT ILLNESS:   Oncology History Overview Note   Cancer Staging  Anal cancer [facility]) Staging form: Anus, AJCC V9 - Clinical stage from 01/30/2021: Stage I (cT1, cN0, cM0) - Signed by Lanny Callander, MD on 03/11/2021 Stage prefix: Initial diagnosis Histologic grade (G): G2 Histologic grading system: 4 grade system     Metastatic squamous cell carcinoma involving lymph node with unknown primary site (HCC)  02/06/2018 Imaging   Impression CT abdomen and pelvis 1.  Enlarged left groin lymph node  which is of uncertain etiology. This would be amenable to percutaneous sampling if deemed clinically appropriate. 2.  Hepatic steatosis. Focal area of low-attenuation in the far lateral left hepatic lobe, not clearly seen on the prior exam. Consider liver ultrasound for further evaluation.  3.  Mild L1 compression deformity which is new.   02/28/2018 Pathology Results   Left groin lymph node, needle core biopsy:       -Metastatic squamous cell carcinoma, see comment  P16 stain is strongly positive, suggestive of cervical or anogenital origin in this location. Immunohistochemical stains for CK5, p63, and pan cytokeratin are positive. ER is weakly positive. CK7 and CK20 are negative.   03/15/2018 Pathology Results   1. Cervix, biopsy, 12 o'clock - BENIGN SQUAMOUS MUCOSA WITH INFLAMMATION. - NO DYSPLASIA OR MALIGNANCY. 2. Endocervix, curettage - BENIGN SQUAMOUS EPITHELIUM.   03/23/2018 PET scan   1. Isolated intensely hypermetabolic enlarged LEFT inguinal lymph node. No clear primary identified. 2. No abnormal activity associated with the vagina or anorectal tissue. 3. No hypermetabolic lymph nodes in the pelvis. 4. Two adjacent nodules in the RIGHT upper lobe with very low metabolic activity. These are not favored primary malignancies. Recommend follow-up per Fleischner criteria. Non-contrast chest CT at 6-12 months is recommended.    07/05/2018 - 07/18/2018 Radiation Therapy   Left inguinal area/nodes treated to 30 Gy in 10 fractions   09/22/2020 Imaging   EXAM: CT ABDOMEN PELVIS W IV CONTRAST   IMPRESSION:  1. Multicystic enhancing right adnexal lesion with mild surrounding stranding. Given short interval since recent CT 3 weeks prior, this is more suspicious for acute findings such as torsion or infection, less likely neoplasm. Recommend endovaginal ultrasound for further evaluation and follow-up to resolution.  2. Other stable chronic findings.    01/30/2021 Pathology Results   DIAGNOSIS    SMALL  TUFT OF TISSUE ABOVE ANAL VERGE, ENDOSCOPIC BIOPSIES:   - HIGH-GRADE SQUAMOUS INTRAEPITHELIAL LESION/ANAL INTRAEPITHELIAL NEOPLASIA 2-3 OUT OF 3 (HGSIL/AIN 2-3).   - SEE COMMENT.  ([id number])   COMMENT  MORPHOLOGICALLY THERE IS AT LEAST TWO THIRDS THICKNESS INVOLVEMENT BY DYSPLASTIC EPITHELIUM WITH MID-LEVEL MITOSES SECONDARY HOWEVER DUE TO POOR ORIENTATION AND MORPHOLOGIC ASSESSMENT IS 2 SERVICE MUCOSA IS ONLY PRESENT IN FOCAL AREAS MORPHOLOGICALLY THIS REPRESENTS AT LEAST AIN 2 AND MOST LIKELY AIN 3.  IN EITHER RESPECT THE FINDINGS ARE CONSISTENT WITH HIGH-GRADE SQUAMOUS INTRAEPITHELIAL LESION WHICH IS ALSO CONFIRMED BY STRONGLY BLOCK LIKE P16 IMMUNOHISTOCHEMICAL  POSITIVITY.    03/05/2021 Pathology Results   Final Diagnosis    Rectal mass, biopsy: -Invasive squamous cell carcinoma, moderately differentiated   Addendum 1    The carcinoma was analyzed by IHC for DNA mismatch repair proteins.  The neoplasm retained nuclear expression of all four mismatch repair proteins, MLH1, PMS2, MSH2, MSH6.  Controls worked appropriately.     Anal cancer (HCC)  01/30/2021 Cancer Staging   Staging  form: Anus, AJCC V9 - Clinical stage from 01/30/2021: Stage I (cT1, cN0, cM0) - Signed by Lanny Callander, MD on 03/11/2021 Stage prefix: Initial diagnosis Histologic grade (G): G2 Histologic grading system: 4 grade system   03/11/2021 Initial Diagnosis   Anal cancer (HCC)   03/30/2021 - 05/01/2021 Chemotherapy   Patient is on Treatment Plan : ANUS Mitomycin  D1,28 / 5FU D1-4, 28-31 q32d     03/15/2022 Imaging    IMPRESSION: 1. Unchanged soft tissue thickening involving the low rectum and anus, in keeping with patient's known anal malignancy. 2. No evidence of lymphadenopathy or metastatic disease in the chest, abdomen, or pelvis. 3. Mild pulmonary fibrosis in a pattern with apical to basal gradient, featuring irregular peripheral interstitial opacity, septal thickening, traction bronchiectasis, and  small areas of subpleural bronchiolectasis at the lung bases without clear evidence of honeycombing. Findings are consistent with probable UIP pattern fibrosis by ATS pulmonary fibrosis criteria.         REVIEW OF SYSTEMS:   Constitutional: Denies fevers, chills or abnormal weight loss Eyes: Denies blurriness of vision Ears, nose, mouth, throat, and face: Denies mucositis or sore throat Respiratory: Denies cough, dyspnea or wheezes Cardiovascular: Denies palpitation, chest discomfort or lower extremity swelling Gastrointestinal:  Denies nausea, heartburn or change in bowel habits Skin: Denies abnormal skin rashes Lymphatics: Denies new lymphadenopathy or easy bruising Neurological:Denies numbness, tingling or new weaknesses Behavioral/Psych: Mood is stable, no new changes  All other systems were reviewed with the patient and are negative.   VITALS:  There were no vitals taken for this visit.  Wt Readings from Last 3 Encounters:  07/27/22 131 lb 14.4 oz (59.8 kg)  04/29/22 133 lb 12.8 oz (60.7 kg)  03/18/22 131 lb 1.6 oz (59.5 kg)    There is no height or weight on file to calculate BMI.  Performance status (ECOG): {CHL ONC [id number]  PHYSICAL EXAM:   GENERAL:alert, no distress and comfortable SKIN: skin color, texture, turgor are normal, no rashes or significant lesions EYES: normal, Conjunctiva are pink and non-injected, sclera clear OROPHARYNX:no exudate, no erythema and lips, buccal mucosa, and tongue normal  NECK: supple, thyroid  normal size, non-tender, without nodularity LYMPH:  no palpable lymphadenopathy in the cervical, axillary or inguinal LUNGS: clear to auscultation and percussion with normal breathing effort HEART: regular rate & rhythm and no murmurs and no lower extremity edema ABDOMEN:abdomen soft, non-tender and normal bowel sounds Musculoskeletal:no cyanosis of digits and no clubbing  NEURO: alert & oriented x 3 with fluent speech, no focal  motor/sensory deficits  LABORATORY DATA:  I have reviewed the data as listed    Component Value Date/Time   NA 141 07/27/2022 1055   K 4.4 07/27/2022 1055   CL 108 07/27/2022 1055   CO2 26 07/27/2022 1055   GLUCOSE 96 07/27/2022 1055   BUN 11 07/27/2022 1055   CREATININE 0.99 07/27/2022 1055   CALCIUM 9.4 07/27/2022 1055   PROT 7.3 07/27/2022 1055   ALBUMIN 3.9 07/27/2022 1055   AST 25 07/27/2022 1055   ALT 18 07/27/2022 1055   ALKPHOS 70 07/27/2022 1055   BILITOT 0.4 07/27/2022 1055   GFRNONAA 55 (L) 07/27/2022 1055   GFRAA >60 07/28/2017 1340    No results found for: SPEP, UPEP  Lab Results  Component Value Date   WBC 4.5 07/27/2022   NEUTROABS 2.7 07/27/2022   HGB 13.8 07/27/2022   HCT 42.5 07/27/2022   MCV 96.4 07/27/2022   PLT 189 07/27/2022  Chemistry      Component Value Date/Time   NA 141 07/27/2022 1055   K 4.4 07/27/2022 1055   CL 108 07/27/2022 1055   CO2 26 07/27/2022 1055   BUN 11 07/27/2022 1055   CREATININE 0.99 07/27/2022 1055      Component Value Date/Time   CALCIUM 9.4 07/27/2022 1055   ALKPHOS 70 07/27/2022 1055   AST 25 07/27/2022 1055   ALT 18 07/27/2022 1055   BILITOT 0.4 07/27/2022 1055       RADIOGRAPHIC STUDIES: I have personally reviewed the radiological images as listed and agreed with the findings in the report. No results found.

## 2023-01-25 ENCOUNTER — Ambulatory Visit: Payer: Medicare PPO | Admitting: Nurse Practitioner

## 2023-01-25 ENCOUNTER — Other Ambulatory Visit: Payer: Medicare PPO

## 2023-01-26 DIAGNOSIS — R058 Other specified cough: Secondary | ICD-10-CM | POA: Diagnosis not present

## 2023-01-26 DIAGNOSIS — R0982 Postnasal drip: Secondary | ICD-10-CM | POA: Diagnosis not present

## 2023-01-26 DIAGNOSIS — M059 Rheumatoid arthritis with rheumatoid factor, unspecified: Secondary | ICD-10-CM | POA: Diagnosis not present

## 2023-01-26 DIAGNOSIS — J984 Other disorders of lung: Secondary | ICD-10-CM | POA: Diagnosis not present

## 2023-01-27 ENCOUNTER — Encounter: Payer: Self-pay | Admitting: Hematology

## 2023-01-27 DIAGNOSIS — R569 Unspecified convulsions: Secondary | ICD-10-CM | POA: Diagnosis not present

## 2023-01-27 DIAGNOSIS — E1151 Type 2 diabetes mellitus with diabetic peripheral angiopathy without gangrene: Secondary | ICD-10-CM | POA: Diagnosis not present

## 2023-01-27 DIAGNOSIS — R2689 Other abnormalities of gait and mobility: Secondary | ICD-10-CM | POA: Diagnosis not present

## 2023-01-27 DIAGNOSIS — I10 Essential (primary) hypertension: Secondary | ICD-10-CM | POA: Diagnosis not present

## 2023-01-27 DIAGNOSIS — I4891 Unspecified atrial fibrillation: Secondary | ICD-10-CM | POA: Diagnosis not present

## 2023-01-27 DIAGNOSIS — I739 Peripheral vascular disease, unspecified: Secondary | ICD-10-CM | POA: Diagnosis not present

## 2023-01-27 DIAGNOSIS — M48062 Spinal stenosis, lumbar region with neurogenic claudication: Secondary | ICD-10-CM | POA: Diagnosis not present

## 2023-01-27 DIAGNOSIS — Z7409 Other reduced mobility: Secondary | ICD-10-CM | POA: Diagnosis not present

## 2023-01-27 DIAGNOSIS — M069 Rheumatoid arthritis, unspecified: Secondary | ICD-10-CM | POA: Diagnosis not present

## 2023-01-27 DIAGNOSIS — E1169 Type 2 diabetes mellitus with other specified complication: Secondary | ICD-10-CM | POA: Diagnosis not present

## 2023-01-27 DIAGNOSIS — R531 Weakness: Secondary | ICD-10-CM | POA: Diagnosis not present

## 2023-01-28 ENCOUNTER — Ambulatory Visit (HOSPITAL_COMMUNITY)
Admission: RE | Admit: 2023-01-28 | Discharge: 2023-01-28 | Disposition: A | Discharge status: Home / Self Care | Payer: Medicare Other | Source: Ambulatory Visit | Attending: Physician Assistant | Admitting: Physician Assistant

## 2023-01-28 DIAGNOSIS — C210 Malignant neoplasm of anus, unspecified: Secondary | ICD-10-CM | POA: Diagnosis not present

## 2023-01-28 DIAGNOSIS — C801 Malignant (primary) neoplasm, unspecified: Secondary | ICD-10-CM | POA: Diagnosis not present

## 2023-01-28 DIAGNOSIS — I700 Atherosclerosis of aorta: Secondary | ICD-10-CM | POA: Diagnosis not present

## 2023-01-28 DIAGNOSIS — C779 Secondary and unspecified malignant neoplasm of lymph node, unspecified: Secondary | ICD-10-CM | POA: Diagnosis not present

## 2023-01-28 MED ORDER — IOHEXOL 300 MG/ML  SOLN
80.0000 mL | Freq: Once | INTRAMUSCULAR | Status: AC | PRN
  Administered 2023-01-28: 80 mL via INTRAVENOUS

## 2023-01-28 MED ORDER — IOHEXOL 300 MG/ML  SOLN
30.0000 mL | Freq: Once | INTRAMUSCULAR | Status: AC | PRN
  Administered 2023-01-28: 30 mL via ORAL

## 2023-02-02 NOTE — Assessment & Plan Note (Deleted)
Metastatic squamous cell carcinoma involving lymph node with unknown primary site [facility]) -cT1N0M0  -she had metastatic squamous cell carcinoma to left groin lymph nodes in February 2020, PET scan was negative for primary tumor, she underwent definitive radiation. -She was found to have anal cancer in 02/2021 -Status post concurrent chemoradiation, completed in April 2023. -Posttreatment PET scan in July 2023 showed treatment response, no evidence of metastasis. -Surveillance CT scan from March 15, 2022 showed unchanged soft tissue thickening involving the low rectum and anus, no evidence of recurrent disease.  -01/28/2023 - restaging CT CAP - Unchanged circumferential wall thickening of the low rectum and anus. No evidence of lymphadenopathy or metastatic disease in the chest, abdomen, or pelvis.

## 2023-02-02 NOTE — Progress Notes (Deleted)
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 -She was found to have anal cancer in 02/2021 -Status post concurrent chemoradiation, completed in April 2023. -Posttreatment PET scan in July 2023 showed treatment response, no evidence of metastasis. -Surveillance CT scan from March 15, 2022 showed unchanged soft tissue thickening involving the low rectum and anus, no evidence of recurrent disease.  -01/28/2023 - restaging CT CAP - Unchanged circumferential wall thickening of the low rectum and anus. No evidence of lymphadenopathy or metastatic disease in the chest, abdomen, or pelvis.    The patient understands the plans discussed today and is in agreement with them.  She knows to contact our office if she develops concerns prior to her next appointment.  I provided *** minutes of face-to-face time during this encounter and > 50% was spent counseling as documented under my assessment and plan.    Carlean Jews, NP  [facility] - A DEPT OF [facility] [address] [place] [address] Dept: [phone] Dept Fax: [phone]   No orders of the defined types were placed in this encounter.     CHIEF COMPLAINT:  CC: f/u anal cancer   Current Treatment:  surveillance   INTERVAL HISTORY:  Lorraine Gilbert is here today for repeat  clinical assessment. She was last seen 07/27/2022 by Karena Addison, [place].  She was having improved appetite while taking megace. She had restaging CT CAP on 01/28/2023.  Imaging showed unchanged circumferential wall thickening of the rectum and anus which is consistent with diagnosis of anorectal malignancy.  There is no evidence of lymphadenopathy or metastatic disease in the chest, abdomen, or pelvis.  There is unchanged mild bibasilar predominant pulmonary fibrosis featuring irregular peripheral interstitial opacity and septal thickening as well as traction funky ectasis and subpleural bronchiolectasis.  This is consistent with probable UIP pattern fibrosis.  She denies fevers or chills. She denies pain. Her appetite is good. Her weight {Weight change:10426}.  I have reviewed the past medical history, past surgical history, social history and family history with the patient and they are unchanged from previous note.  ALLERGIES:  is allergic to ibuprofen.  MEDICATIONS:  Current Outpatient Medications  Medication Sig Dispense Refill   ACCU-CHEK AVIVA PLUS test strip 1 each as directed.     Accu-Chek Softclix Lancets lancets 1 each by Other route as directed.     ACETAMINOPHEN 8 HOUR PO Take 1 tablet by mouth as needed.     albuterol (PROVENTIL) (2.5 MG/3ML) 0.083% nebulizer solution SMARTSIG:1 Vial(s) Via Nebulizer Every 4-6 Hours PRN     Alcohol Swabs (ALCOHOL WIPES) 70 % PADS DropSafe Alcohol Prep Pads     amiodarone (PACERONE) 100 MG tablet Take 100 mg by mouth daily.     Blood Glucose Calibration (ACCU-CHEK AVIVA) SOLN      Blood Glucose Monitoring Suppl (TRUE METRIX AIR GLUCOSE METER) w/Device KIT See admin instructions.  clopidogrel (PLAVIX) 75 MG tablet Take 1 tablet (75 mg total) by mouth daily. 90 tablet 3   ELIQUIS 2.5 MG TABS tablet Take 2.5 mg by mouth 2 (two) times daily.     escitalopram (LEXAPRO) 10 MG tablet Take 10 mg by mouth daily.     gabapentin (NEURONTIN) 100 MG capsule Take 100 mg  by mouth 3 (three) times daily.     levalbuterol (XOPENEX) 1.25 MG/3ML nebulizer solution Inhale into the lungs.     levETIRAcetam (KEPPRA) 500 MG tablet Take 1 tablet (500 mg total) by mouth 2 (two) times daily. 180 tablet 3   megestrol (MEGACE) 400 MG/10ML suspension Take 10 mLs (400 mg total) by mouth daily. 240 mL 3   nitroGLYCERIN (NITROSTAT) 0.4 MG SL tablet Place 0.4 mg under the tongue every 5 (five) minutes as needed for chest pain.     polyethylene glycol powder (GLYCOLAX/MIRALAX) 17 GM/SCOOP powder Take by mouth.     rosuvastatin (CRESTOR) 5 MG tablet Take by mouth.     TRELEGY ELLIPTA 200-62.5-25 MCG/ACT AEPB Take 1 puff by mouth daily.     No current facility-administered medications for this visit.    HISTORY OF PRESENT ILLNESS:   Oncology History Overview Note   Cancer Staging  Anal cancer [facility]) Staging form: Anus, AJCC V9 - Clinical stage from 01/30/2021: Stage I (cT1, cN0, cM0) - Signed by Malachy Mood, MD on 03/11/2021 Stage prefix: Initial diagnosis Histologic grade (G): G2 Histologic grading system: 4 grade system     Metastatic squamous cell carcinoma involving lymph node with unknown primary site (HCC)  02/06/2018 Imaging   Impression CT abdomen and pelvis 1.  Enlarged left groin lymph node which is of uncertain etiology. This would be amenable to percutaneous sampling if deemed clinically appropriate. 2.  Hepatic steatosis. Focal area of low-attenuation in the far lateral left hepatic lobe, not clearly seen on the prior exam. Consider liver ultrasound for further evaluation.  3.  Mild L1 compression deformity which is new.   02/28/2018 Pathology Results   Left groin lymph node, needle core biopsy:       -Metastatic squamous cell carcinoma, see comment  P16 stain is strongly positive, suggestive of cervical or anogenital origin in this location. Immunohistochemical stains for CK5, p63, and pan cytokeratin are positive. ER is weakly positive. CK7 and CK20 are  negative.   03/15/2018 Pathology Results   1. Cervix, biopsy, 12 o'clock - BENIGN SQUAMOUS MUCOSA WITH INFLAMMATION. - NO DYSPLASIA OR MALIGNANCY. 2. Endocervix, curettage - BENIGN SQUAMOUS EPITHELIUM.   03/23/2018 PET scan   1. Isolated intensely hypermetabolic enlarged LEFT inguinal lymph node. No clear primary identified. 2. No abnormal activity associated with the vagina or anorectal tissue. 3. No hypermetabolic lymph nodes in the pelvis. 4. Two adjacent nodules in the RIGHT upper lobe with very low metabolic activity. These are not favored primary malignancies. Recommend follow-up per Fleischner criteria. Non-contrast chest CT at 6-12 months is recommended.    07/05/2018 - 07/18/2018 Radiation Therapy   Left inguinal area/nodes treated to 30 Gy in 10 fractions   09/22/2020 Imaging   EXAM: CT ABDOMEN PELVIS W IV CONTRAST   IMPRESSION:  1. Multicystic enhancing right adnexal lesion with mild surrounding stranding. Given short interval since recent CT 3 weeks prior, this is more suspicious for acute findings such as torsion or infection, less likely neoplasm. Recommend endovaginal ultrasound for further evaluation and follow-up to resolution.  2. Other stable chronic findings.    01/30/2021 Pathology Results  DIAGNOSIS   SMALL  TUFT OF TISSUE ABOVE ANAL VERGE, ENDOSCOPIC BIOPSIES:   - HIGH-GRADE SQUAMOUS INTRAEPITHELIAL LESION/ANAL INTRAEPITHELIAL NEOPLASIA 2-3 OUT OF 3 (HGSIL/AIN 2-3).   - SEE COMMENT.  ([id number])   COMMENT  MORPHOLOGICALLY THERE IS AT LEAST TWO THIRDS THICKNESS INVOLVEMENT BY DYSPLASTIC EPITHELIUM WITH MID-LEVEL MITOSES SECONDARY HOWEVER DUE TO POOR ORIENTATION AND MORPHOLOGIC ASSESSMENT IS 2 SERVICE MUCOSA IS ONLY PRESENT IN FOCAL AREAS MORPHOLOGICALLY THIS REPRESENTS AT LEAST AIN 2 AND MOST LIKELY AIN 3.  IN EITHER RESPECT THE FINDINGS ARE CONSISTENT WITH HIGH-GRADE SQUAMOUS INTRAEPITHELIAL LESION WHICH IS ALSO CONFIRMED BY STRONGLY BLOCK LIKE P16 IMMUNOHISTOCHEMICAL   POSITIVITY.    03/05/2021 Pathology Results   Final Diagnosis    Rectal mass, biopsy: -Invasive squamous cell carcinoma, moderately differentiated   Addendum 1    The carcinoma was analyzed by [facility] for DNA mismatch repair proteins.  The neoplasm retained nuclear expression of all four mismatch repair proteins, MLH1, PMS2, MSH2, MSH6.  Controls worked appropriately.     Anal cancer (HCC)  01/30/2021 Cancer Staging   Staging form: Anus, AJCC V9 - Clinical stage from 01/30/2021: Stage I (cT1, cN0, cM0) - Signed by Malachy Mood, MD on 03/11/2021 Stage prefix: Initial diagnosis Histologic grade (G): G2 Histologic grading system: 4 grade system   03/11/2021 Initial Diagnosis   Anal cancer (HCC)   03/30/2021 - 05/01/2021 Chemotherapy   Patient is on Treatment Plan : ANUS Mitomycin D1,28 / 5FU D1-4, 28-31 q32d     03/15/2022 Imaging    IMPRESSION: 1. Unchanged soft tissue thickening involving the low rectum and anus, in keeping with patient's known anal malignancy. 2. No evidence of lymphadenopathy or metastatic disease in the chest, abdomen, or pelvis. 3. Mild pulmonary fibrosis in a pattern with apical to basal gradient, featuring irregular peripheral interstitial opacity, septal thickening, traction bronchiectasis, and small areas of subpleural bronchiolectasis at the lung bases without clear evidence of honeycombing. Findings are consistent with probable UIP pattern fibrosis by ATS pulmonary fibrosis criteria.         REVIEW OF SYSTEMS:   Constitutional: Denies fevers, chills or abnormal weight loss Eyes: Denies blurriness of vision Ears, nose, mouth, throat, and face: Denies mucositis or sore throat Respiratory: Denies cough, dyspnea or wheezes Cardiovascular: Denies palpitation, chest discomfort or lower extremity swelling Gastrointestinal:  Denies nausea, heartburn or change in bowel habits Skin: Denies abnormal skin rashes Lymphatics: Denies new lymphadenopathy or easy  bruising Neurological:Denies numbness, tingling or new weaknesses Behavioral/Psych: Mood is stable, no new changes  All other systems were reviewed with the patient and are negative.   VITALS:  There were no vitals taken for this visit.  Wt Readings from Last 3 Encounters:  07/27/22 131 lb 14.4 oz (59.8 kg)  04/29/22 133 lb 12.8 oz (60.7 kg)  03/18/22 131 lb 1.6 oz (59.5 kg)    There is no height or weight on file to calculate BMI.  Performance status (ECOG): {CHL ONC [id number]  PHYSICAL EXAM:   GENERAL:alert, no distress and comfortable SKIN: skin color, texture, turgor are normal, no rashes or significant lesions EYES: normal, Conjunctiva are pink and non-injected, sclera clear OROPHARYNX:no exudate, no erythema and lips, buccal mucosa, and tongue normal  NECK: supple, thyroid normal size, non-tender, without nodularity LYMPH:  no palpable lymphadenopathy in the cervical, axillary or inguinal LUNGS: clear to auscultation and percussion with normal breathing effort HEART: regular rate & rhythm and no murmurs and no lower extremity edema ABDOMEN:abdomen soft, non-tender and normal bowel sounds  Musculoskeletal:no cyanosis of digits and no clubbing  NEURO: alert & oriented x 3 with fluent speech, no focal motor/sensory deficits  LABORATORY DATA:  I have reviewed the data as listed    Component Value Date/Time   NA 141 07/27/2022 1055   K 4.4 07/27/2022 1055   CL 108 07/27/2022 1055   CO2 26 07/27/2022 1055   GLUCOSE 96 07/27/2022 1055   BUN 11 07/27/2022 1055   CREATININE 0.99 07/27/2022 1055   CALCIUM 9.4 07/27/2022 1055   PROT 7.3 07/27/2022 1055   ALBUMIN 3.9 07/27/2022 1055   AST 25 07/27/2022 1055   ALT 18 07/27/2022 1055   ALKPHOS 70 07/27/2022 1055   BILITOT 0.4 07/27/2022 1055   GFRNONAA 55 (L) 07/27/2022 1055   GFRAA >60 07/28/2017 1340    No results found for: "SPEP", "UPEP"  Lab Results  Component Value Date   WBC 4.5 07/27/2022   NEUTROABS 2.7  07/27/2022   HGB 13.8 07/27/2022   HCT 42.5 07/27/2022   MCV 96.4 07/27/2022   PLT 189 07/27/2022      Chemistry      Component Value Date/Time   NA 141 07/27/2022 1055   K 4.4 07/27/2022 1055   CL 108 07/27/2022 1055   CO2 26 07/27/2022 1055   BUN 11 07/27/2022 1055   CREATININE 0.99 07/27/2022 1055      Component Value Date/Time   CALCIUM 9.4 07/27/2022 1055   ALKPHOS 70 07/27/2022 1055   AST 25 07/27/2022 1055   ALT 18 07/27/2022 1055   BILITOT 0.4 07/27/2022 1055       RADIOGRAPHIC STUDIES: I have personally reviewed the radiological images as listed and agreed with the findings in the report. CT CHEST ABDOMEN PELVIS W CONTRAST Result Date: 01/30/2023 CLINICAL DATA:  Anal cancer restaging, treatment complete * Tracking Code: BO * EXAM: CT CHEST, ABDOMEN, AND PELVIS WITH CONTRAST TECHNIQUE: Multidetector CT imaging of the chest, abdomen and pelvis was performed following the standard protocol during bolus administration of intravenous contrast. RADIATION DOSE REDUCTION: This exam was performed according to the departmental dose-optimization program which includes automated exposure control, adjustment of the mA and/or kV according to patient size and/or use of iterative reconstruction technique. CONTRAST:  80mL OMNIPAQUE IOHEXOL 300 MG/ML SOLN additional oral enteric contrast COMPARISON:  03/25/2022 FINDINGS: CT CHEST FINDINGS Cardiovascular: Aortic atherosclerosis. Cardiomegaly. Left and right coronary artery calcifications. No pericardial effusion. Mediastinum/Nodes: No enlarged mediastinal, hilar, or axillary lymph nodes. Thyroid gland, trachea, and esophagus demonstrate no significant findings. Lungs/Pleura: Unchanged mild bibasilar predominant pulmonary fibrosis featuring irregular peripheral interstitial opacity and septal thickening as well as traction bronchiectasis and subpleural bronchiolectasis the lung bases. No pleural effusion or pneumothorax. Musculoskeletal: No chest  wall abnormality. No acute osseous findings. CT ABDOMEN PELVIS FINDINGS Hepatobiliary: No solid liver abnormality is seen. No gallstones, gallbladder wall thickening, or biliary dilatation. Pancreas: Unremarkable. No pancreatic ductal dilatation or surrounding inflammatory changes. Spleen: Normal in size without significant abnormality. Adrenals/Urinary Tract: Adrenal glands are unremarkable. Kidneys are normal, without renal calculi, solid lesion, or hydronephrosis. Bladder is unremarkable. Stomach/Bowel: Stomach is within normal limits. Appendix appears normal. Unchanged circumferential wall thickening of the low rectum and anus. Vascular/Lymphatic: Aortic atherosclerosis. No enlarged abdominal or pelvic lymph nodes. Reproductive: No mass or other abnormality. Other: No abdominal wall hernia or abnormality. No ascites. Musculoskeletal: No acute osseous findings. Unchanged superior endplate deformity of L1. IMPRESSION: 1. Unchanged circumferential wall thickening of the low rectum and anus, in keeping with anorectal malignancy. 2.  No evidence of lymphadenopathy or metastatic disease in the chest, abdomen, or pelvis. 3. Unchanged mild bibasilar predominant pulmonary fibrosis featuring irregular peripheral interstitial opacity and septal thickening as well as traction bronchiectasis and subpleural bronchiolectasis. Findings remain consistent with probable UIP pattern fibrosis. 4. Cardiomegaly and coronary artery disease. Aortic Atherosclerosis (ICD10-I70.0). Electronically Signed   By: Jearld Lesch M.D.   On: 01/30/2023 21:40

## 2023-02-03 ENCOUNTER — Other Ambulatory Visit: Payer: Medicare PPO

## 2023-02-03 ENCOUNTER — Other Ambulatory Visit: Payer: Self-pay

## 2023-02-03 ENCOUNTER — Ambulatory Visit: Payer: Medicare PPO | Admitting: Nurse Practitioner

## 2023-02-03 DIAGNOSIS — R2689 Other abnormalities of gait and mobility: Secondary | ICD-10-CM | POA: Diagnosis not present

## 2023-02-03 DIAGNOSIS — C210 Malignant neoplasm of anus, unspecified: Secondary | ICD-10-CM

## 2023-02-03 DIAGNOSIS — E559 Vitamin D deficiency, unspecified: Secondary | ICD-10-CM | POA: Diagnosis not present

## 2023-02-03 DIAGNOSIS — R569 Unspecified convulsions: Secondary | ICD-10-CM | POA: Diagnosis not present

## 2023-02-03 DIAGNOSIS — M48062 Spinal stenosis, lumbar region with neurogenic claudication: Secondary | ICD-10-CM | POA: Diagnosis not present

## 2023-02-03 DIAGNOSIS — E538 Deficiency of other specified B group vitamins: Secondary | ICD-10-CM | POA: Diagnosis not present

## 2023-02-03 NOTE — Progress Notes (Signed)
Patient called due to not being to come to her appointment because of transportation issues. Will need to contact patient to reschedule. Sent message to Rockey Situ to have her rescheduled.

## 2023-02-11 ENCOUNTER — Other Ambulatory Visit: Payer: Self-pay | Admitting: Family Medicine

## 2023-02-11 DIAGNOSIS — Z1231 Encounter for screening mammogram for malignant neoplasm of breast: Secondary | ICD-10-CM

## 2023-02-20 NOTE — Assessment & Plan Note (Signed)
Metastatic squamous cell carcinoma involving lymph node with unknown primary site [facility]) -cT1N0M0  -she had metastatic squamous cell carcinoma to left groin lymph nodes in February 2020, PET scan was negative for primary tumor, she underwent definitive radiation. -She was found to have anal cancer in 02/2021 -Status post concurrent chemoradiation, completed in April 2023. -Posttreatment PET scan in July 2023 showed treatment response, no evidence of metastasis. -Surveillance CT scan from March 15, 2022 showed unchanged soft tissue thickening involving the low rectum and anus, no evidence of recurrent disease.  -01/28/2023 - restaging CT CAP - Unchanged circumferential wall thickening of the low rectum and anus. No evidence of lymphadenopathy or metastatic disease in the chest, abdomen, or pelvis. -02/21/2023 - benign exam. Patient scheduled to see Dr. Oren Bracket, colorectal surgeon, tomorrow. Will see her back with labs in 3 months. Will get CT CAP or PET in 6 months or sooner if recommended by surgeon.

## 2023-02-20 NOTE — Progress Notes (Unsigned)
Patient Care Team: Tally Joe, MD as PCP - General (Family Medicine) Van Clines, MD as Consulting Physician (Neurology) Malachy Mood, MD as Consulting Physician (Oncology) Marily Lente, RN (Inactive) as Oncology Nurse Navigator (Oncology)  Clinic Day:  02/21/2023  Referring physician: Tally Joe, MD  ASSESSMENT & PLAN:   Assessment & Plan: Anal cancer [facility]) Metastatic squamous cell carcinoma involving lymph node with unknown primary site [facility]) -cT1N0M0  -she had metastatic squamous cell carcinoma to left groin lymph nodes in February 2020, PET scan was negative for primary tumor, she underwent definitive radiation. -She was found to have anal cancer in 02/2021 -Status post concurrent chemoradiation, completed in April 2023. -Posttreatment PET scan in July 2023 showed treatment response, no evidence of metastasis. -Surveillance CT scan from March 15, 2022 showed unchanged soft tissue thickening involving the low rectum and anus, no evidence of recurrent disease.  -01/28/2023 - restaging CT CAP - Unchanged circumferential wall thickening of the low rectum and anus. No evidence of lymphadenopathy or metastatic disease in the chest, abdomen, or pelvis. -02/21/2023 - benign exam. Patient scheduled to see Dr. Oren Bracket, colorectal surgeon, tomorrow. Will see her back with labs in 3 months. Will get CT CAP or PET in 6 months or sooner if recommended by surgeon.     Plan: Labs reviewed  -CBC showing WBC 5.0; Hgb 13.1; Hct 40.2; Plt 203; Anc 3.0 -CMP - K 4.7; glucose 139; BUN 18; Creatinine 1.06; eGFR 51; Ca 9.3; LFTs normal.   -reviewed CT CAP with the patient showing stable thickening if the low rectum and anus and no evidence of lymphadenopathy or metastatic disease in the chest, abdomen, or pelvis.  -she is scheduled to see Dr. Oren Bracket tomorrow, who will directly visualize the area of anal tumor.  Labs with follow up in 3 months.  CT CAP or PET scan for surveillance in 6 months. The  patent was seen along with Dr. Mosetta Putt today.   The patient understands the plans discussed today and is in agreement with them.  She knows to contact our office if she develops concerns prior to her next appointment.  I provided 25 minutes of face-to-face time during this encounter and > 50% was spent counseling as documented under my assessment and plan.    Carlean Jews, NP  [facility] - A DEPT OF [facility] [address] [place] [address] Dept: [phone] Dept Fax: [phone]   No orders of the defined types were placed in this encounter.     CHIEF COMPLAINT:  CC: f/u anal cancer   Current Treatment:  surveillance   INTERVAL HISTORY:  Lorraine Gilbert is here today for repeat clinical assessment. She was last seen 07/27/2022 by Karena Addison, [place].  She was having improved appetite while taking megace. She had restaging CT CAP on 01/28/2023.  Imaging showed unchanged circumferential wall thickening of the rectum and anus which is consistent with diagnosis of anorectal malignancy.  There is no evidence of lymphadenopathy or metastatic disease in the chest, abdomen, or pelvis. She is scheduled to see Dr. Oren Bracket, colorectal surgeon, tomorrow. The patient states that she has noted some blood and mucus in her stool recently. Reports daily bowel movements which are normal in consistency. She notes mucus present nearly every time, but blood has been present only once. She states that she has started having seizures and severe pain approximately 2 months ago. Has been hospitalized twice because of this.  She has had some injections directly into her spine or this. She will be referred to rheumatology for further evaluation. She denies chest pain, chest pressure, or shortness of breath. She denies headaches or visual disturbances. She denies abdominal pain, nausea, vomiting, or changes in bowel or bladder habits.  She denies fevers or chills.  She states that her appetite has not been so great. She has recently been prescribed Megace by another provider which is improving her appetite. Her weight has been stable.  I have reviewed the past medical history, past surgical history, social history and family history with the patient and they are unchanged from previous note.  ALLERGIES:  is allergic to ibuprofen.  MEDICATIONS:  Current Outpatient Medications  Medication Sig Dispense Refill   ACCU-CHEK AVIVA PLUS test strip 1 each as directed.     Accu-Chek Softclix Lancets lancets 1 each by Other route as directed.     ACETAMINOPHEN 8 HOUR PO Take 1 tablet by mouth as needed.     albuterol (PROVENTIL) (2.5 MG/3ML) 0.083% nebulizer solution SMARTSIG:1 Vial(s) Via Nebulizer Every 4-6 Hours PRN     Alcohol Swabs (ALCOHOL WIPES) 70 % PADS DropSafe Alcohol Prep Pads     amiodarone (PACERONE) 100 MG tablet Take 100 mg by mouth daily.     Blood Glucose Calibration (ACCU-CHEK AVIVA) SOLN      Blood Glucose Monitoring Suppl (TRUE METRIX AIR GLUCOSE METER) w/Device KIT See admin instructions.     ELIQUIS 2.5 MG TABS tablet Take 2.5 mg by mouth 2 (two) times daily.     levalbuterol (XOPENEX) 1.25 MG/3ML nebulizer solution Inhale into the lungs.     levETIRAcetam (KEPPRA) 500 MG tablet Take 1 tablet (500 mg total) by mouth 2 (two) times daily. 180 tablet 3   megestrol (MEGACE) 400 MG/10ML suspension Take 10 mLs (400 mg total) by mouth daily. 240 mL 3   nitroGLYCERIN (NITROSTAT) 0.4 MG SL tablet Place 0.4 mg under the tongue every 5 (five) minutes as needed for chest pain.     polyethylene glycol powder (GLYCOLAX/MIRALAX) 17 GM/SCOOP powder Take by mouth.     TRELEGY ELLIPTA 200-62.5-25 MCG/ACT AEPB Take 1 puff by mouth daily.     No current facility-administered medications for this visit.    HISTORY OF PRESENT ILLNESS:   Oncology History Overview Note   Cancer Staging  Anal cancer [facility]) Staging form: Anus, AJCC V9 - Clinical stage from  01/30/2021: Stage I (cT1, cN0, cM0) - Signed by Malachy Mood, MD on 03/11/2021 Stage prefix: Initial diagnosis Histologic grade (G): G2 Histologic grading system: 4 grade system     Metastatic squamous cell carcinoma involving lymph node with unknown primary site (HCC)  02/06/2018 Imaging   Impression CT abdomen and pelvis 1.  Enlarged left groin lymph node which is of uncertain etiology. This would be amenable to percutaneous sampling if deemed clinically appropriate. 2.  Hepatic steatosis. Focal area of low-attenuation in the far lateral left hepatic lobe, not clearly seen on the prior exam. Consider liver ultrasound for further evaluation.  3.  Mild L1 compression deformity which is new.   02/28/2018 Pathology Results   Left groin lymph node, needle core biopsy:       -Metastatic squamous cell carcinoma, see comment  P16 stain is strongly positive, suggestive of cervical or anogenital origin in this location. Immunohistochemical stains for CK5, p63, and pan cytokeratin are positive. ER is weakly positive. CK7 and CK20 are negative.   03/15/2018 Pathology Results   1.  Cervix, biopsy, 12 o'clock - BENIGN SQUAMOUS MUCOSA WITH INFLAMMATION. - NO DYSPLASIA OR MALIGNANCY. 2. Endocervix, curettage - BENIGN SQUAMOUS EPITHELIUM.   03/23/2018 PET scan   1. Isolated intensely hypermetabolic enlarged LEFT inguinal lymph node. No clear primary identified. 2. No abnormal activity associated with the vagina or anorectal tissue. 3. No hypermetabolic lymph nodes in the pelvis. 4. Two adjacent nodules in the RIGHT upper lobe with very low metabolic activity. These are not favored primary malignancies. Recommend follow-up per Fleischner criteria. Non-contrast chest CT at 6-12 months is recommended.    07/05/2018 - 07/18/2018 Radiation Therapy   Left inguinal area/nodes treated to 30 Gy in 10 fractions   09/22/2020 Imaging   EXAM: CT ABDOMEN PELVIS W IV CONTRAST   IMPRESSION:  1. Multicystic enhancing  right adnexal lesion with mild surrounding stranding. Given short interval since recent CT 3 weeks prior, this is more suspicious for acute findings such as torsion or infection, less likely neoplasm. Recommend endovaginal ultrasound for further evaluation and follow-up to resolution.  2. Other stable chronic findings.    01/30/2021 Pathology Results   DIAGNOSIS   SMALL  TUFT OF TISSUE ABOVE ANAL VERGE, ENDOSCOPIC BIOPSIES:   - HIGH-GRADE SQUAMOUS INTRAEPITHELIAL LESION/ANAL INTRAEPITHELIAL NEOPLASIA 2-3 OUT OF 3 (HGSIL/AIN 2-3).   - SEE COMMENT.  ([id number])   COMMENT  MORPHOLOGICALLY THERE IS AT LEAST TWO THIRDS THICKNESS INVOLVEMENT BY DYSPLASTIC EPITHELIUM WITH MID-LEVEL MITOSES SECONDARY HOWEVER DUE TO POOR ORIENTATION AND MORPHOLOGIC ASSESSMENT IS 2 SERVICE MUCOSA IS ONLY PRESENT IN FOCAL AREAS MORPHOLOGICALLY THIS REPRESENTS AT LEAST AIN 2 AND MOST LIKELY AIN 3.  IN EITHER RESPECT THE FINDINGS ARE CONSISTENT WITH HIGH-GRADE SQUAMOUS INTRAEPITHELIAL LESION WHICH IS ALSO CONFIRMED BY STRONGLY BLOCK LIKE P16 IMMUNOHISTOCHEMICAL  POSITIVITY.    03/05/2021 Pathology Results   Final Diagnosis    Rectal mass, biopsy: -Invasive squamous cell carcinoma, moderately differentiated   Addendum 1    The carcinoma was analyzed by [facility] for DNA mismatch repair proteins.  The neoplasm retained nuclear expression of all four mismatch repair proteins, MLH1, PMS2, MSH2, MSH6.  Controls worked appropriately.     Anal cancer (HCC)  01/30/2021 Cancer Staging   Staging form: Anus, AJCC V9 - Clinical stage from 01/30/2021: Stage I (cT1, cN0, cM0) - Signed by Malachy Mood, MD on 03/11/2021 Stage prefix: Initial diagnosis Histologic grade (G): G2 Histologic grading system: 4 grade system   03/11/2021 Initial Diagnosis   Anal cancer (HCC)   03/30/2021 - 05/01/2021 Chemotherapy   Patient is on Treatment Plan : ANUS Mitomycin D1,28 / 5FU D1-4, 28-31 q32d     03/15/2022 Imaging    IMPRESSION: 1. Unchanged soft tissue  thickening involving the low rectum and anus, in keeping with patient's known anal malignancy. 2. No evidence of lymphadenopathy or metastatic disease in the chest, abdomen, or pelvis. 3. Mild pulmonary fibrosis in a pattern with apical to basal gradient, featuring irregular peripheral interstitial opacity, septal thickening, traction bronchiectasis, and small areas of subpleural bronchiolectasis at the lung bases without clear evidence of honeycombing. Findings are consistent with probable UIP pattern fibrosis by ATS pulmonary fibrosis criteria.     01/28/2023 Imaging   CT chest abdomen and pelvis with contrast  IMPRESSION: 1. Unchanged circumferential wall thickening of the low rectum and anus, in keeping with anorectal malignancy. 2. No evidence of lymphadenopathy or metastatic disease in the chest, abdomen, or pelvis. 3. Unchanged mild bibasilar predominant pulmonary fibrosis featuring irregular peripheral interstitial opacity and septal thickening as well as traction  bronchiectasis and subpleural bronchiolectasis. Findings remain consistent with probable UIP pattern fibrosis. 4. Cardiomegaly and coronary artery disease.   Aortic Atherosclerosis (ICD10-I70.0).           REVIEW OF SYSTEMS:   Constitutional: Denies fevers, chills or abnormal weight loss Eyes: Denies blurriness of vision Ears, nose, mouth, throat, and face: Denies mucositis or sore throat Respiratory: Denies cough, dyspnea or wheezes Cardiovascular: Denies palpitation, chest discomfort or lower extremity swelling Gastrointestinal:  Denies nausea, heartburn or change in bowel habits Skin: Denies abnormal skin rashes Lymphatics: Denies new lymphadenopathy or easy bruising Neurological:Denies numbness, tingling or new weaknesses. Started having seizures about 2 months ago. Is seeing neurologist for this.  Behavioral/Psych: Mood is stable, no new changes  Musculoskeletal: reports generalized joint pain. This is  severe and effecting her ability to walk. Using a Rollator to help with mobility.  All other systems were reviewed with the patient and are negative.   VITALS:   Today's Vitals   02/21/23 0924 02/21/23 0930 02/21/23 0944  BP: (!) 157/73  (!) 152/78  Pulse: 78    Resp: 16    Temp: 98 F (36.7 C)    SpO2: 100%    Weight: 130 lb 3.2 oz (59.1 kg)    PainSc:  9     Body mass index is 23.06 kg/m.   Wt Readings from Last 3 Encounters:  02/21/23 130 lb 3.2 oz (59.1 kg)  07/27/22 131 lb 14.4 oz (59.8 kg)  04/29/22 133 lb 12.8 oz (60.7 kg)    Body mass index is 23.06 kg/m.  Performance status (ECOG): 1 - Symptomatic but completely ambulatory  PHYSICAL EXAM:   GENERAL:alert, no distress and comfortable SKIN: skin color, texture, turgor are normal, no rashes or significant lesions EYES: normal, Conjunctiva are pink and non-injected, sclera clear OROPHARYNX:no exudate, no erythema and lips, buccal mucosa, and tongue normal  NECK: supple, thyroid normal size, non-tender, without nodularity LYMPH:  no palpable lymphadenopathy in the cervical, axillary or inguinal LUNGS: clear to auscultation and percussion with normal breathing effort HEART: regular rate & rhythm and no murmurs and no lower extremity edema ABDOMEN:abdomen soft, non-tender and normal bowel sounds Musculoskeletal:no cyanosis of digits and no clubbing  NEURO: alert & oriented x 3 with fluent speech, no focal motor/sensory deficits. Appears unsteady on her feet. Takes a few minutes to get started with walking after standing up from a seated position.  Rectal:  Small, soft, palpable lesion in posterior of rectum. Non tender. No evidence of bleeding or mucus at this time.   LABORATORY DATA:  I have reviewed the data as listed    Component Value Date/Time   NA 139 02/21/2023 0909   K 4.7 02/21/2023 0909   CL 109 02/21/2023 0909   CO2 23 02/21/2023 0909   GLUCOSE 139 (H) 02/21/2023 0909   BUN 18 02/21/2023 0909    CREATININE 1.06 (H) 02/21/2023 0909   CALCIUM 9.3 02/21/2023 0909   PROT 7.1 02/21/2023 0909   ALBUMIN 4.0 02/21/2023 0909   AST 17 02/21/2023 0909   ALT 19 02/21/2023 0909   ALKPHOS 56 02/21/2023 0909   BILITOT 0.5 02/21/2023 0909   GFRNONAA 51 (L) 02/21/2023 0909   GFRAA >60 07/28/2017 1340   Lab Results  Component Value Date   WBC 5.0 02/21/2023   NEUTROABS 3.0 02/21/2023   HGB 13.1 02/21/2023   HCT 40.2 02/21/2023   MCV 96.2 02/21/2023   PLT 203 02/21/2023   RADIOGRAPHIC STUDIES: CT CHEST ABDOMEN PELVIS  W CONTRAST Result Date: 01/30/2023 CLINICAL DATA:  Anal cancer restaging, treatment complete * Tracking Code: BO * EXAM: CT CHEST, ABDOMEN, AND PELVIS WITH CONTRAST TECHNIQUE: Multidetector CT imaging of the chest, abdomen and pelvis was performed following the standard protocol during bolus administration of intravenous contrast. RADIATION DOSE REDUCTION: This exam was performed according to the departmental dose-optimization program which includes automated exposure control, adjustment of the mA and/or kV according to patient size and/or use of iterative reconstruction technique. CONTRAST:  80mL OMNIPAQUE IOHEXOL 300 MG/ML SOLN additional oral enteric contrast COMPARISON:  03/25/2022 FINDINGS: CT CHEST FINDINGS Cardiovascular: Aortic atherosclerosis. Cardiomegaly. Left and right coronary artery calcifications. No pericardial effusion. Mediastinum/Nodes: No enlarged mediastinal, hilar, or axillary lymph nodes. Thyroid gland, trachea, and esophagus demonstrate no significant findings. Lungs/Pleura: Unchanged mild bibasilar predominant pulmonary fibrosis featuring irregular peripheral interstitial opacity and septal thickening as well as traction bronchiectasis and subpleural bronchiolectasis the lung bases. No pleural effusion or pneumothorax. Musculoskeletal: No chest wall abnormality. No acute osseous findings. CT ABDOMEN PELVIS FINDINGS Hepatobiliary: No solid liver abnormality is seen.  No gallstones, gallbladder wall thickening, or biliary dilatation. Pancreas: Unremarkable. No pancreatic ductal dilatation or surrounding inflammatory changes. Spleen: Normal in size without significant abnormality. Adrenals/Urinary Tract: Adrenal glands are unremarkable. Kidneys are normal, without renal calculi, solid lesion, or hydronephrosis. Bladder is unremarkable. Stomach/Bowel: Stomach is within normal limits. Appendix appears normal. Unchanged circumferential wall thickening of the low rectum and anus. Vascular/Lymphatic: Aortic atherosclerosis. No enlarged abdominal or pelvic lymph nodes. Reproductive: No mass or other abnormality. Other: No abdominal wall hernia or abnormality. No ascites. Musculoskeletal: No acute osseous findings. Unchanged superior endplate deformity of L1. IMPRESSION: 1. Unchanged circumferential wall thickening of the low rectum and anus, in keeping with anorectal malignancy. 2. No evidence of lymphadenopathy or metastatic disease in the chest, abdomen, or pelvis. 3. Unchanged mild bibasilar predominant pulmonary fibrosis featuring irregular peripheral interstitial opacity and septal thickening as well as traction bronchiectasis and subpleural bronchiolectasis. Findings remain consistent with probable UIP pattern fibrosis. 4. Cardiomegaly and coronary artery disease. Aortic Atherosclerosis (ICD10-I70.0). Electronically Signed   By: Jearld Lesch M.D.   On: 01/30/2023 21:40    Addendum I have seen the patient, examined her. I agree with the assessment and and plan and have edited the notes.   I personally reviewed her surveillance CT scan from January 28, 2023, which showed unchanged low rectal wall thickening, no evidence of progressive or metastatic disease.  She is scheduled to see her surgeon Dr. Oren Bracket in next few days for endoscopic exam.  Will continue cancer surveillance, repeated next imaging in 6 months if she is clinically stable.  Will see her back in 3 months for  follow-up.  All questions were answered.  I spent a total of 25 minutes for her visit today, more than 50% time on face-to-face counseling.  Malachy Mood MD 02/21/2023

## 2023-02-21 ENCOUNTER — Inpatient Hospital Stay: Payer: Medicare Other | Admitting: Nurse Practitioner

## 2023-02-21 ENCOUNTER — Inpatient Hospital Stay: Payer: Medicare Other | Attending: Nurse Practitioner

## 2023-02-21 VITALS — BP 152/78 | HR 78 | Temp 98.0°F | Resp 16 | Wt 130.2 lb

## 2023-02-21 DIAGNOSIS — Z85048 Personal history of other malignant neoplasm of rectum, rectosigmoid junction, and anus: Secondary | ICD-10-CM | POA: Diagnosis not present

## 2023-02-21 DIAGNOSIS — C210 Malignant neoplasm of anus, unspecified: Secondary | ICD-10-CM | POA: Diagnosis not present

## 2023-02-21 DIAGNOSIS — Z9221 Personal history of antineoplastic chemotherapy: Secondary | ICD-10-CM | POA: Insufficient documentation

## 2023-02-21 DIAGNOSIS — Z923 Personal history of irradiation: Secondary | ICD-10-CM | POA: Insufficient documentation

## 2023-02-21 DIAGNOSIS — C801 Malignant (primary) neoplasm, unspecified: Secondary | ICD-10-CM

## 2023-02-21 LAB — CBC WITH DIFFERENTIAL (CANCER CENTER ONLY)
Abs Immature Granulocytes: 0.09 10*3/uL — ABNORMAL HIGH (ref 0.00–0.07)
Basophils Absolute: 0 10*3/uL (ref 0.0–0.1)
Basophils Relative: 0 %
Eosinophils Absolute: 0.2 10*3/uL (ref 0.0–0.5)
Eosinophils Relative: 4 %
HCT: 40.2 % (ref 36.0–46.0)
Hemoglobin: 13.1 g/dL (ref 12.0–15.0)
Immature Granulocytes: 2 %
Lymphocytes Relative: 23 %
Lymphs Abs: 1.2 10*3/uL (ref 0.7–4.0)
MCH: 31.3 pg (ref 26.0–34.0)
MCHC: 32.6 g/dL (ref 30.0–36.0)
MCV: 96.2 fL (ref 80.0–100.0)
Monocytes Absolute: 0.5 10*3/uL (ref 0.1–1.0)
Monocytes Relative: 11 %
Neutro Abs: 3 10*3/uL (ref 1.7–7.7)
Neutrophils Relative %: 60 %
Platelet Count: 203 10*3/uL (ref 150–400)
RBC: 4.18 MIL/uL (ref 3.87–5.11)
RDW: 13.5 % (ref 11.5–15.5)
WBC Count: 5 10*3/uL (ref 4.0–10.5)
nRBC: 0 % (ref 0.0–0.2)

## 2023-02-21 LAB — CMP (CANCER CENTER ONLY)
ALT: 19 U/L (ref 0–44)
AST: 17 U/L (ref 15–41)
Albumin: 4 g/dL (ref 3.5–5.0)
Alkaline Phosphatase: 56 U/L (ref 38–126)
Anion gap: 7 (ref 5–15)
BUN: 18 mg/dL (ref 8–23)
CO2: 23 mmol/L (ref 22–32)
Calcium: 9.3 mg/dL (ref 8.9–10.3)
Chloride: 109 mmol/L (ref 98–111)
Creatinine: 1.06 mg/dL — ABNORMAL HIGH (ref 0.44–1.00)
GFR, Estimated: 51 mL/min — ABNORMAL LOW (ref >60)
Glucose, Bld: 139 mg/dL — ABNORMAL HIGH (ref 70–99)
Potassium: 4.7 mmol/L (ref 3.5–5.1)
Sodium: 139 mmol/L (ref 135–145)
Total Bilirubin: 0.5 mg/dL (ref 0.0–1.2)
Total Protein: 7.1 g/dL (ref 6.5–8.1)

## 2023-02-23 DIAGNOSIS — C801 Malignant (primary) neoplasm, unspecified: Secondary | ICD-10-CM | POA: Diagnosis not present

## 2023-02-23 DIAGNOSIS — R569 Unspecified convulsions: Secondary | ICD-10-CM | POA: Diagnosis not present

## 2023-02-23 DIAGNOSIS — C779 Secondary and unspecified malignant neoplasm of lymph node, unspecified: Secondary | ICD-10-CM | POA: Diagnosis not present

## 2023-02-23 DIAGNOSIS — C210 Malignant neoplasm of anus, unspecified: Secondary | ICD-10-CM | POA: Diagnosis not present

## 2023-02-28 ENCOUNTER — Telehealth: Payer: Self-pay | Admitting: Nurse Practitioner

## 2023-02-28 NOTE — Telephone Encounter (Signed)
 Scheduled appointments per 2/3 los. Unable to leave a VM due to mailbox not being set up.

## 2023-03-03 ENCOUNTER — Ambulatory Visit
Admission: RE | Admit: 2023-03-03 | Discharge: 2023-03-03 | Disposition: A | Discharge status: Home / Self Care | Payer: Medicare Other | Source: Ambulatory Visit | Attending: Family Medicine | Admitting: Family Medicine

## 2023-03-03 DIAGNOSIS — Z85048 Personal history of other malignant neoplasm of rectum, rectosigmoid junction, and anus: Secondary | ICD-10-CM | POA: Diagnosis not present

## 2023-03-03 DIAGNOSIS — Z1231 Encounter for screening mammogram for malignant neoplasm of breast: Secondary | ICD-10-CM | POA: Diagnosis not present

## 2023-03-03 DIAGNOSIS — M069 Rheumatoid arthritis, unspecified: Secondary | ICD-10-CM | POA: Diagnosis not present

## 2023-03-07 DIAGNOSIS — R1013 Epigastric pain: Secondary | ICD-10-CM | POA: Diagnosis not present

## 2023-03-07 DIAGNOSIS — R140 Abdominal distension (gaseous): Secondary | ICD-10-CM | POA: Diagnosis not present

## 2023-03-07 DIAGNOSIS — R1310 Dysphagia, unspecified: Secondary | ICD-10-CM | POA: Diagnosis not present

## 2023-03-11 DIAGNOSIS — M48062 Spinal stenosis, lumbar region with neurogenic claudication: Secondary | ICD-10-CM | POA: Diagnosis not present

## 2023-03-11 DIAGNOSIS — M545 Low back pain, unspecified: Secondary | ICD-10-CM | POA: Diagnosis not present

## 2023-03-17 DIAGNOSIS — G8929 Other chronic pain: Secondary | ICD-10-CM | POA: Diagnosis not present

## 2023-03-17 DIAGNOSIS — Z7409 Other reduced mobility: Secondary | ICD-10-CM | POA: Diagnosis not present

## 2023-03-17 DIAGNOSIS — M48062 Spinal stenosis, lumbar region with neurogenic claudication: Secondary | ICD-10-CM | POA: Diagnosis not present

## 2023-03-17 DIAGNOSIS — R531 Weakness: Secondary | ICD-10-CM | POA: Diagnosis not present

## 2023-03-17 DIAGNOSIS — R2689 Other abnormalities of gait and mobility: Secondary | ICD-10-CM | POA: Diagnosis not present

## 2023-03-17 DIAGNOSIS — M5442 Lumbago with sciatica, left side: Secondary | ICD-10-CM | POA: Diagnosis not present

## 2023-03-17 DIAGNOSIS — M545 Low back pain, unspecified: Secondary | ICD-10-CM | POA: Diagnosis not present

## 2023-03-17 DIAGNOSIS — M5441 Lumbago with sciatica, right side: Secondary | ICD-10-CM | POA: Diagnosis not present

## 2023-03-17 DIAGNOSIS — R569 Unspecified convulsions: Secondary | ICD-10-CM | POA: Diagnosis not present

## 2023-03-31 DIAGNOSIS — M5442 Lumbago with sciatica, left side: Secondary | ICD-10-CM | POA: Diagnosis not present

## 2023-03-31 DIAGNOSIS — R531 Weakness: Secondary | ICD-10-CM | POA: Diagnosis not present

## 2023-03-31 DIAGNOSIS — Z7409 Other reduced mobility: Secondary | ICD-10-CM | POA: Diagnosis not present

## 2023-03-31 DIAGNOSIS — M545 Low back pain, unspecified: Secondary | ICD-10-CM | POA: Diagnosis not present

## 2023-03-31 DIAGNOSIS — M48062 Spinal stenosis, lumbar region with neurogenic claudication: Secondary | ICD-10-CM | POA: Diagnosis not present

## 2023-03-31 DIAGNOSIS — G8929 Other chronic pain: Secondary | ICD-10-CM | POA: Diagnosis not present

## 2023-03-31 DIAGNOSIS — M5441 Lumbago with sciatica, right side: Secondary | ICD-10-CM | POA: Diagnosis not present

## 2023-03-31 DIAGNOSIS — R2689 Other abnormalities of gait and mobility: Secondary | ICD-10-CM | POA: Diagnosis not present

## 2023-03-31 DIAGNOSIS — R569 Unspecified convulsions: Secondary | ICD-10-CM | POA: Diagnosis not present

## 2023-04-04 DIAGNOSIS — R1310 Dysphagia, unspecified: Secondary | ICD-10-CM | POA: Diagnosis not present

## 2023-04-04 DIAGNOSIS — R1013 Epigastric pain: Secondary | ICD-10-CM | POA: Diagnosis not present

## 2023-04-04 DIAGNOSIS — K2950 Unspecified chronic gastritis without bleeding: Secondary | ICD-10-CM | POA: Diagnosis not present

## 2023-04-06 DIAGNOSIS — R569 Unspecified convulsions: Secondary | ICD-10-CM | POA: Diagnosis not present

## 2023-04-06 DIAGNOSIS — N390 Urinary tract infection, site not specified: Secondary | ICD-10-CM | POA: Diagnosis not present

## 2023-04-06 DIAGNOSIS — G8918 Other acute postprocedural pain: Secondary | ICD-10-CM | POA: Diagnosis not present

## 2023-04-06 DIAGNOSIS — R1310 Dysphagia, unspecified: Secondary | ICD-10-CM | POA: Diagnosis not present

## 2023-04-06 DIAGNOSIS — R1013 Epigastric pain: Secondary | ICD-10-CM | POA: Diagnosis not present

## 2023-04-06 DIAGNOSIS — R9431 Abnormal electrocardiogram [ECG] [EKG]: Secondary | ICD-10-CM | POA: Diagnosis not present

## 2023-04-06 DIAGNOSIS — R410 Disorientation, unspecified: Secondary | ICD-10-CM | POA: Diagnosis not present

## 2023-04-08 DIAGNOSIS — N390 Urinary tract infection, site not specified: Secondary | ICD-10-CM | POA: Diagnosis not present

## 2023-04-08 DIAGNOSIS — I69854 Hemiplegia and hemiparesis following other cerebrovascular disease affecting left non-dominant side: Secondary | ICD-10-CM | POA: Diagnosis not present

## 2023-04-08 DIAGNOSIS — G9389 Other specified disorders of brain: Secondary | ICD-10-CM | POA: Diagnosis not present

## 2023-04-08 DIAGNOSIS — I5032 Chronic diastolic (congestive) heart failure: Secondary | ICD-10-CM | POA: Diagnosis not present

## 2023-04-08 DIAGNOSIS — Z79899 Other long term (current) drug therapy: Secondary | ICD-10-CM | POA: Diagnosis not present

## 2023-04-08 DIAGNOSIS — E039 Hypothyroidism, unspecified: Secondary | ICD-10-CM | POA: Diagnosis not present

## 2023-04-08 DIAGNOSIS — I639 Cerebral infarction, unspecified: Secondary | ICD-10-CM | POA: Diagnosis not present

## 2023-04-08 DIAGNOSIS — I63411 Cerebral infarction due to embolism of right middle cerebral artery: Secondary | ICD-10-CM | POA: Diagnosis not present

## 2023-04-08 DIAGNOSIS — Z789 Other specified health status: Secondary | ICD-10-CM | POA: Diagnosis not present

## 2023-04-08 DIAGNOSIS — Z7901 Long term (current) use of anticoagulants: Secondary | ICD-10-CM | POA: Diagnosis not present

## 2023-04-08 DIAGNOSIS — E785 Hyperlipidemia, unspecified: Secondary | ICD-10-CM | POA: Diagnosis not present

## 2023-04-08 DIAGNOSIS — Z7409 Other reduced mobility: Secondary | ICD-10-CM | POA: Diagnosis not present

## 2023-04-08 DIAGNOSIS — Z85048 Personal history of other malignant neoplasm of rectum, rectosigmoid junction, and anus: Secondary | ICD-10-CM | POA: Diagnosis not present

## 2023-04-08 DIAGNOSIS — R29818 Other symptoms and signs involving the nervous system: Secondary | ICD-10-CM | POA: Diagnosis not present

## 2023-04-08 DIAGNOSIS — I2510 Atherosclerotic heart disease of native coronary artery without angina pectoris: Secondary | ICD-10-CM | POA: Diagnosis not present

## 2023-04-08 DIAGNOSIS — R29705 NIHSS score 5: Secondary | ICD-10-CM | POA: Diagnosis not present

## 2023-04-08 DIAGNOSIS — R1310 Dysphagia, unspecified: Secondary | ICD-10-CM | POA: Diagnosis not present

## 2023-04-08 DIAGNOSIS — Z7951 Long term (current) use of inhaled steroids: Secondary | ICD-10-CM | POA: Diagnosis not present

## 2023-04-08 DIAGNOSIS — E119 Type 2 diabetes mellitus without complications: Secondary | ICD-10-CM | POA: Diagnosis not present

## 2023-04-08 DIAGNOSIS — K219 Gastro-esophageal reflux disease without esophagitis: Secondary | ICD-10-CM | POA: Diagnosis not present

## 2023-04-08 DIAGNOSIS — I69391 Dysphagia following cerebral infarction: Secondary | ICD-10-CM | POA: Diagnosis not present

## 2023-04-08 DIAGNOSIS — R29810 Facial weakness: Secondary | ICD-10-CM | POA: Diagnosis not present

## 2023-04-08 DIAGNOSIS — I351 Nonrheumatic aortic (valve) insufficiency: Secondary | ICD-10-CM | POA: Diagnosis not present

## 2023-04-08 DIAGNOSIS — I4891 Unspecified atrial fibrillation: Secondary | ICD-10-CM | POA: Diagnosis not present

## 2023-04-08 DIAGNOSIS — J45909 Unspecified asthma, uncomplicated: Secondary | ICD-10-CM | POA: Diagnosis not present

## 2023-04-08 DIAGNOSIS — I10 Essential (primary) hypertension: Secondary | ICD-10-CM | POA: Diagnosis not present

## 2023-04-08 DIAGNOSIS — Z955 Presence of coronary angioplasty implant and graft: Secondary | ICD-10-CM | POA: Diagnosis not present

## 2023-04-08 DIAGNOSIS — I110 Hypertensive heart disease with heart failure: Secondary | ICD-10-CM | POA: Diagnosis not present

## 2023-04-08 DIAGNOSIS — M069 Rheumatoid arthritis, unspecified: Secondary | ICD-10-CM | POA: Diagnosis not present

## 2023-04-08 DIAGNOSIS — R471 Dysarthria and anarthria: Secondary | ICD-10-CM | POA: Diagnosis not present

## 2023-04-12 DIAGNOSIS — E039 Hypothyroidism, unspecified: Secondary | ICD-10-CM | POA: Diagnosis not present

## 2023-04-12 DIAGNOSIS — J4489 Other specified chronic obstructive pulmonary disease: Secondary | ICD-10-CM | POA: Diagnosis not present

## 2023-04-12 DIAGNOSIS — I498 Other specified cardiac arrhythmias: Secondary | ICD-10-CM | POA: Diagnosis not present

## 2023-04-12 DIAGNOSIS — I5032 Chronic diastolic (congestive) heart failure: Secondary | ICD-10-CM | POA: Diagnosis not present

## 2023-04-12 DIAGNOSIS — I110 Hypertensive heart disease with heart failure: Secondary | ICD-10-CM | POA: Diagnosis not present

## 2023-04-12 DIAGNOSIS — Z736 Limitation of activities due to disability: Secondary | ICD-10-CM | POA: Diagnosis not present

## 2023-04-12 DIAGNOSIS — I69354 Hemiplegia and hemiparesis following cerebral infarction affecting left non-dominant side: Secondary | ICD-10-CM | POA: Diagnosis not present

## 2023-04-12 DIAGNOSIS — I69398 Other sequelae of cerebral infarction: Secondary | ICD-10-CM | POA: Diagnosis not present

## 2023-04-12 DIAGNOSIS — I69322 Dysarthria following cerebral infarction: Secondary | ICD-10-CM | POA: Diagnosis not present

## 2023-04-12 DIAGNOSIS — I6389 Other cerebral infarction: Secondary | ICD-10-CM | POA: Diagnosis not present

## 2023-04-12 DIAGNOSIS — I872 Venous insufficiency (chronic) (peripheral): Secondary | ICD-10-CM | POA: Diagnosis not present

## 2023-04-12 DIAGNOSIS — R1310 Dysphagia, unspecified: Secondary | ICD-10-CM | POA: Diagnosis not present

## 2023-04-12 DIAGNOSIS — I4891 Unspecified atrial fibrillation: Secondary | ICD-10-CM | POA: Diagnosis not present

## 2023-04-12 DIAGNOSIS — R414 Neurologic neglect syndrome: Secondary | ICD-10-CM | POA: Diagnosis not present

## 2023-04-12 DIAGNOSIS — I2510 Atherosclerotic heart disease of native coronary artery without angina pectoris: Secondary | ICD-10-CM | POA: Diagnosis not present

## 2023-04-12 DIAGNOSIS — R1312 Dysphagia, oropharyngeal phase: Secondary | ICD-10-CM | POA: Diagnosis not present

## 2023-04-12 DIAGNOSIS — J449 Chronic obstructive pulmonary disease, unspecified: Secondary | ICD-10-CM | POA: Diagnosis not present

## 2023-04-12 DIAGNOSIS — I69392 Facial weakness following cerebral infarction: Secondary | ICD-10-CM | POA: Diagnosis not present

## 2023-04-12 DIAGNOSIS — I639 Cerebral infarction, unspecified: Secondary | ICD-10-CM | POA: Diagnosis not present

## 2023-04-12 DIAGNOSIS — I951 Orthostatic hypotension: Secondary | ICD-10-CM | POA: Diagnosis not present

## 2023-04-12 DIAGNOSIS — Z789 Other specified health status: Secondary | ICD-10-CM | POA: Diagnosis not present

## 2023-04-12 DIAGNOSIS — R569 Unspecified convulsions: Secondary | ICD-10-CM | POA: Diagnosis not present

## 2023-04-12 DIAGNOSIS — E1151 Type 2 diabetes mellitus with diabetic peripheral angiopathy without gangrene: Secondary | ICD-10-CM | POA: Diagnosis not present

## 2023-04-12 DIAGNOSIS — I69391 Dysphagia following cerebral infarction: Secondary | ICD-10-CM | POA: Diagnosis not present

## 2023-04-12 DIAGNOSIS — K5900 Constipation, unspecified: Secondary | ICD-10-CM | POA: Diagnosis not present

## 2023-04-12 DIAGNOSIS — R0781 Pleurodynia: Secondary | ICD-10-CM | POA: Diagnosis not present

## 2023-04-12 DIAGNOSIS — E041 Nontoxic single thyroid nodule: Secondary | ICD-10-CM | POA: Diagnosis not present

## 2023-04-12 DIAGNOSIS — I10 Essential (primary) hypertension: Secondary | ICD-10-CM | POA: Diagnosis not present

## 2023-04-12 DIAGNOSIS — R633 Feeding difficulties, unspecified: Secondary | ICD-10-CM | POA: Diagnosis not present

## 2023-04-12 DIAGNOSIS — R7989 Other specified abnormal findings of blood chemistry: Secondary | ICD-10-CM | POA: Diagnosis not present

## 2023-04-12 DIAGNOSIS — R2689 Other abnormalities of gait and mobility: Secondary | ICD-10-CM | POA: Diagnosis not present

## 2023-04-12 DIAGNOSIS — Z7409 Other reduced mobility: Secondary | ICD-10-CM | POA: Diagnosis not present

## 2023-04-13 DIAGNOSIS — Z789 Other specified health status: Secondary | ICD-10-CM | POA: Diagnosis not present

## 2023-04-13 DIAGNOSIS — I69391 Dysphagia following cerebral infarction: Secondary | ICD-10-CM | POA: Diagnosis not present

## 2023-04-13 DIAGNOSIS — Z7409 Other reduced mobility: Secondary | ICD-10-CM | POA: Diagnosis not present

## 2023-04-13 DIAGNOSIS — I10 Essential (primary) hypertension: Secondary | ICD-10-CM | POA: Diagnosis not present

## 2023-04-13 DIAGNOSIS — I4891 Unspecified atrial fibrillation: Secondary | ICD-10-CM | POA: Diagnosis not present

## 2023-04-14 DIAGNOSIS — I4891 Unspecified atrial fibrillation: Secondary | ICD-10-CM | POA: Diagnosis not present

## 2023-04-14 DIAGNOSIS — Z789 Other specified health status: Secondary | ICD-10-CM | POA: Diagnosis not present

## 2023-04-14 DIAGNOSIS — I10 Essential (primary) hypertension: Secondary | ICD-10-CM | POA: Diagnosis not present

## 2023-04-14 DIAGNOSIS — Z7409 Other reduced mobility: Secondary | ICD-10-CM | POA: Diagnosis not present

## 2023-04-14 DIAGNOSIS — I69391 Dysphagia following cerebral infarction: Secondary | ICD-10-CM | POA: Diagnosis not present

## 2023-04-15 DIAGNOSIS — Z7409 Other reduced mobility: Secondary | ICD-10-CM | POA: Diagnosis not present

## 2023-04-15 DIAGNOSIS — I10 Essential (primary) hypertension: Secondary | ICD-10-CM | POA: Diagnosis not present

## 2023-04-15 DIAGNOSIS — I69391 Dysphagia following cerebral infarction: Secondary | ICD-10-CM | POA: Diagnosis not present

## 2023-04-15 DIAGNOSIS — I4891 Unspecified atrial fibrillation: Secondary | ICD-10-CM | POA: Diagnosis not present

## 2023-04-15 DIAGNOSIS — I498 Other specified cardiac arrhythmias: Secondary | ICD-10-CM | POA: Diagnosis not present

## 2023-04-15 DIAGNOSIS — Z789 Other specified health status: Secondary | ICD-10-CM | POA: Diagnosis not present

## 2023-05-03 DIAGNOSIS — I509 Heart failure, unspecified: Secondary | ICD-10-CM | POA: Diagnosis not present

## 2023-05-03 DIAGNOSIS — I69354 Hemiplegia and hemiparesis following cerebral infarction affecting left non-dominant side: Secondary | ICD-10-CM | POA: Diagnosis not present

## 2023-05-03 DIAGNOSIS — Z7901 Long term (current) use of anticoagulants: Secondary | ICD-10-CM | POA: Diagnosis not present

## 2023-05-03 DIAGNOSIS — Z9181 History of falling: Secondary | ICD-10-CM | POA: Diagnosis not present

## 2023-05-03 DIAGNOSIS — I69391 Dysphagia following cerebral infarction: Secondary | ICD-10-CM | POA: Diagnosis not present

## 2023-05-03 DIAGNOSIS — R1312 Dysphagia, oropharyngeal phase: Secondary | ICD-10-CM | POA: Diagnosis not present

## 2023-05-03 DIAGNOSIS — J45909 Unspecified asthma, uncomplicated: Secondary | ICD-10-CM | POA: Diagnosis not present

## 2023-05-03 DIAGNOSIS — E1151 Type 2 diabetes mellitus with diabetic peripheral angiopathy without gangrene: Secondary | ICD-10-CM | POA: Diagnosis not present

## 2023-05-03 DIAGNOSIS — I739 Peripheral vascular disease, unspecified: Secondary | ICD-10-CM | POA: Diagnosis not present

## 2023-05-03 DIAGNOSIS — I4891 Unspecified atrial fibrillation: Secondary | ICD-10-CM | POA: Diagnosis not present

## 2023-05-03 DIAGNOSIS — K5900 Constipation, unspecified: Secondary | ICD-10-CM | POA: Diagnosis not present

## 2023-05-03 DIAGNOSIS — J4489 Other specified chronic obstructive pulmonary disease: Secondary | ICD-10-CM | POA: Diagnosis not present

## 2023-05-03 DIAGNOSIS — I110 Hypertensive heart disease with heart failure: Secondary | ICD-10-CM | POA: Diagnosis not present

## 2023-05-03 DIAGNOSIS — I2510 Atherosclerotic heart disease of native coronary artery without angina pectoris: Secondary | ICD-10-CM | POA: Diagnosis not present

## 2023-05-03 DIAGNOSIS — I69322 Dysarthria following cerebral infarction: Secondary | ICD-10-CM | POA: Diagnosis not present

## 2023-05-04 DIAGNOSIS — R1312 Dysphagia, oropharyngeal phase: Secondary | ICD-10-CM | POA: Diagnosis not present

## 2023-05-04 DIAGNOSIS — Z9181 History of falling: Secondary | ICD-10-CM | POA: Diagnosis not present

## 2023-05-04 DIAGNOSIS — J4489 Other specified chronic obstructive pulmonary disease: Secondary | ICD-10-CM | POA: Diagnosis not present

## 2023-05-04 DIAGNOSIS — I69322 Dysarthria following cerebral infarction: Secondary | ICD-10-CM | POA: Diagnosis not present

## 2023-05-04 DIAGNOSIS — K5900 Constipation, unspecified: Secondary | ICD-10-CM | POA: Diagnosis not present

## 2023-05-04 DIAGNOSIS — I110 Hypertensive heart disease with heart failure: Secondary | ICD-10-CM | POA: Diagnosis not present

## 2023-05-04 DIAGNOSIS — J45909 Unspecified asthma, uncomplicated: Secondary | ICD-10-CM | POA: Diagnosis not present

## 2023-05-04 DIAGNOSIS — I739 Peripheral vascular disease, unspecified: Secondary | ICD-10-CM | POA: Diagnosis not present

## 2023-05-04 DIAGNOSIS — I69391 Dysphagia following cerebral infarction: Secondary | ICD-10-CM | POA: Diagnosis not present

## 2023-05-04 DIAGNOSIS — Z7901 Long term (current) use of anticoagulants: Secondary | ICD-10-CM | POA: Diagnosis not present

## 2023-05-04 DIAGNOSIS — E1151 Type 2 diabetes mellitus with diabetic peripheral angiopathy without gangrene: Secondary | ICD-10-CM | POA: Diagnosis not present

## 2023-05-04 DIAGNOSIS — I4891 Unspecified atrial fibrillation: Secondary | ICD-10-CM | POA: Diagnosis not present

## 2023-05-04 DIAGNOSIS — I509 Heart failure, unspecified: Secondary | ICD-10-CM | POA: Diagnosis not present

## 2023-05-04 DIAGNOSIS — I2510 Atherosclerotic heart disease of native coronary artery without angina pectoris: Secondary | ICD-10-CM | POA: Diagnosis not present

## 2023-05-04 DIAGNOSIS — I69354 Hemiplegia and hemiparesis following cerebral infarction affecting left non-dominant side: Secondary | ICD-10-CM | POA: Diagnosis not present

## 2023-05-05 DIAGNOSIS — I4891 Unspecified atrial fibrillation: Secondary | ICD-10-CM | POA: Diagnosis not present

## 2023-05-05 DIAGNOSIS — I739 Peripheral vascular disease, unspecified: Secondary | ICD-10-CM | POA: Diagnosis not present

## 2023-05-05 DIAGNOSIS — R609 Edema, unspecified: Secondary | ICD-10-CM | POA: Diagnosis not present

## 2023-05-05 DIAGNOSIS — Z85048 Personal history of other malignant neoplasm of rectum, rectosigmoid junction, and anus: Secondary | ICD-10-CM | POA: Diagnosis not present

## 2023-05-05 DIAGNOSIS — I10 Essential (primary) hypertension: Secondary | ICD-10-CM | POA: Diagnosis not present

## 2023-05-05 DIAGNOSIS — Z8673 Personal history of transient ischemic attack (TIA), and cerebral infarction without residual deficits: Secondary | ICD-10-CM | POA: Diagnosis not present

## 2023-05-05 DIAGNOSIS — M792 Neuralgia and neuritis, unspecified: Secondary | ICD-10-CM | POA: Diagnosis not present

## 2023-05-05 DIAGNOSIS — R1310 Dysphagia, unspecified: Secondary | ICD-10-CM | POA: Diagnosis not present

## 2023-05-05 DIAGNOSIS — Z7901 Long term (current) use of anticoagulants: Secondary | ICD-10-CM | POA: Diagnosis not present

## 2023-05-06 DIAGNOSIS — K5900 Constipation, unspecified: Secondary | ICD-10-CM | POA: Diagnosis not present

## 2023-05-06 DIAGNOSIS — I2510 Atherosclerotic heart disease of native coronary artery without angina pectoris: Secondary | ICD-10-CM | POA: Diagnosis not present

## 2023-05-06 DIAGNOSIS — I69391 Dysphagia following cerebral infarction: Secondary | ICD-10-CM | POA: Diagnosis not present

## 2023-05-06 DIAGNOSIS — I4891 Unspecified atrial fibrillation: Secondary | ICD-10-CM | POA: Diagnosis not present

## 2023-05-06 DIAGNOSIS — I110 Hypertensive heart disease with heart failure: Secondary | ICD-10-CM | POA: Diagnosis not present

## 2023-05-06 DIAGNOSIS — I69354 Hemiplegia and hemiparesis following cerebral infarction affecting left non-dominant side: Secondary | ICD-10-CM | POA: Diagnosis not present

## 2023-05-06 DIAGNOSIS — R1312 Dysphagia, oropharyngeal phase: Secondary | ICD-10-CM | POA: Diagnosis not present

## 2023-05-06 DIAGNOSIS — J4489 Other specified chronic obstructive pulmonary disease: Secondary | ICD-10-CM | POA: Diagnosis not present

## 2023-05-06 DIAGNOSIS — Z7901 Long term (current) use of anticoagulants: Secondary | ICD-10-CM | POA: Diagnosis not present

## 2023-05-06 DIAGNOSIS — J45909 Unspecified asthma, uncomplicated: Secondary | ICD-10-CM | POA: Diagnosis not present

## 2023-05-06 DIAGNOSIS — E1151 Type 2 diabetes mellitus with diabetic peripheral angiopathy without gangrene: Secondary | ICD-10-CM | POA: Diagnosis not present

## 2023-05-06 DIAGNOSIS — I69322 Dysarthria following cerebral infarction: Secondary | ICD-10-CM | POA: Diagnosis not present

## 2023-05-06 DIAGNOSIS — I739 Peripheral vascular disease, unspecified: Secondary | ICD-10-CM | POA: Diagnosis not present

## 2023-05-06 DIAGNOSIS — Z9181 History of falling: Secondary | ICD-10-CM | POA: Diagnosis not present

## 2023-05-06 DIAGNOSIS — I509 Heart failure, unspecified: Secondary | ICD-10-CM | POA: Diagnosis not present

## 2023-05-09 DIAGNOSIS — K5900 Constipation, unspecified: Secondary | ICD-10-CM | POA: Diagnosis not present

## 2023-05-09 DIAGNOSIS — I2510 Atherosclerotic heart disease of native coronary artery without angina pectoris: Secondary | ICD-10-CM | POA: Diagnosis not present

## 2023-05-09 DIAGNOSIS — E1151 Type 2 diabetes mellitus with diabetic peripheral angiopathy without gangrene: Secondary | ICD-10-CM | POA: Diagnosis not present

## 2023-05-09 DIAGNOSIS — Z7901 Long term (current) use of anticoagulants: Secondary | ICD-10-CM | POA: Diagnosis not present

## 2023-05-09 DIAGNOSIS — C210 Malignant neoplasm of anus, unspecified: Secondary | ICD-10-CM | POA: Diagnosis not present

## 2023-05-09 DIAGNOSIS — M7732 Calcaneal spur, left foot: Secondary | ICD-10-CM | POA: Diagnosis not present

## 2023-05-09 DIAGNOSIS — J4489 Other specified chronic obstructive pulmonary disease: Secondary | ICD-10-CM | POA: Diagnosis not present

## 2023-05-09 DIAGNOSIS — R42 Dizziness and giddiness: Secondary | ICD-10-CM | POA: Diagnosis not present

## 2023-05-09 DIAGNOSIS — Z9181 History of falling: Secondary | ICD-10-CM | POA: Diagnosis not present

## 2023-05-09 DIAGNOSIS — M79672 Pain in left foot: Secondary | ICD-10-CM | POA: Diagnosis not present

## 2023-05-09 DIAGNOSIS — R1312 Dysphagia, oropharyngeal phase: Secondary | ICD-10-CM | POA: Diagnosis not present

## 2023-05-09 DIAGNOSIS — I69322 Dysarthria following cerebral infarction: Secondary | ICD-10-CM | POA: Diagnosis not present

## 2023-05-09 DIAGNOSIS — I739 Peripheral vascular disease, unspecified: Secondary | ICD-10-CM | POA: Diagnosis not present

## 2023-05-09 DIAGNOSIS — I69391 Dysphagia following cerebral infarction: Secondary | ICD-10-CM | POA: Diagnosis not present

## 2023-05-09 DIAGNOSIS — I110 Hypertensive heart disease with heart failure: Secondary | ICD-10-CM | POA: Diagnosis not present

## 2023-05-09 DIAGNOSIS — I4891 Unspecified atrial fibrillation: Secondary | ICD-10-CM | POA: Diagnosis not present

## 2023-05-09 DIAGNOSIS — J45909 Unspecified asthma, uncomplicated: Secondary | ICD-10-CM | POA: Diagnosis not present

## 2023-05-09 DIAGNOSIS — I69354 Hemiplegia and hemiparesis following cerebral infarction affecting left non-dominant side: Secondary | ICD-10-CM | POA: Diagnosis not present

## 2023-05-09 DIAGNOSIS — Z8673 Personal history of transient ischemic attack (TIA), and cerebral infarction without residual deficits: Secondary | ICD-10-CM | POA: Diagnosis not present

## 2023-05-09 DIAGNOSIS — I509 Heart failure, unspecified: Secondary | ICD-10-CM | POA: Diagnosis not present

## 2023-05-10 DIAGNOSIS — I4891 Unspecified atrial fibrillation: Secondary | ICD-10-CM | POA: Diagnosis not present

## 2023-05-10 DIAGNOSIS — I2510 Atherosclerotic heart disease of native coronary artery without angina pectoris: Secondary | ICD-10-CM | POA: Diagnosis not present

## 2023-05-10 DIAGNOSIS — I509 Heart failure, unspecified: Secondary | ICD-10-CM | POA: Diagnosis not present

## 2023-05-10 DIAGNOSIS — I69322 Dysarthria following cerebral infarction: Secondary | ICD-10-CM | POA: Diagnosis not present

## 2023-05-10 DIAGNOSIS — I69354 Hemiplegia and hemiparesis following cerebral infarction affecting left non-dominant side: Secondary | ICD-10-CM | POA: Diagnosis not present

## 2023-05-10 DIAGNOSIS — I110 Hypertensive heart disease with heart failure: Secondary | ICD-10-CM | POA: Diagnosis not present

## 2023-05-10 DIAGNOSIS — E1151 Type 2 diabetes mellitus with diabetic peripheral angiopathy without gangrene: Secondary | ICD-10-CM | POA: Diagnosis not present

## 2023-05-10 DIAGNOSIS — I739 Peripheral vascular disease, unspecified: Secondary | ICD-10-CM | POA: Diagnosis not present

## 2023-05-10 DIAGNOSIS — J4489 Other specified chronic obstructive pulmonary disease: Secondary | ICD-10-CM | POA: Diagnosis not present

## 2023-05-10 DIAGNOSIS — Z7901 Long term (current) use of anticoagulants: Secondary | ICD-10-CM | POA: Diagnosis not present

## 2023-05-10 DIAGNOSIS — I69391 Dysphagia following cerebral infarction: Secondary | ICD-10-CM | POA: Diagnosis not present

## 2023-05-10 DIAGNOSIS — R1312 Dysphagia, oropharyngeal phase: Secondary | ICD-10-CM | POA: Diagnosis not present

## 2023-05-10 DIAGNOSIS — K5900 Constipation, unspecified: Secondary | ICD-10-CM | POA: Diagnosis not present

## 2023-05-10 DIAGNOSIS — J45909 Unspecified asthma, uncomplicated: Secondary | ICD-10-CM | POA: Diagnosis not present

## 2023-05-10 DIAGNOSIS — Z9181 History of falling: Secondary | ICD-10-CM | POA: Diagnosis not present

## 2023-05-12 DIAGNOSIS — I69354 Hemiplegia and hemiparesis following cerebral infarction affecting left non-dominant side: Secondary | ICD-10-CM | POA: Diagnosis not present

## 2023-05-12 DIAGNOSIS — I509 Heart failure, unspecified: Secondary | ICD-10-CM | POA: Diagnosis not present

## 2023-05-12 DIAGNOSIS — K5900 Constipation, unspecified: Secondary | ICD-10-CM | POA: Diagnosis not present

## 2023-05-12 DIAGNOSIS — Z9181 History of falling: Secondary | ICD-10-CM | POA: Diagnosis not present

## 2023-05-12 DIAGNOSIS — J4489 Other specified chronic obstructive pulmonary disease: Secondary | ICD-10-CM | POA: Diagnosis not present

## 2023-05-12 DIAGNOSIS — I2510 Atherosclerotic heart disease of native coronary artery without angina pectoris: Secondary | ICD-10-CM | POA: Diagnosis not present

## 2023-05-12 DIAGNOSIS — I110 Hypertensive heart disease with heart failure: Secondary | ICD-10-CM | POA: Diagnosis not present

## 2023-05-12 DIAGNOSIS — I69322 Dysarthria following cerebral infarction: Secondary | ICD-10-CM | POA: Diagnosis not present

## 2023-05-12 DIAGNOSIS — R1312 Dysphagia, oropharyngeal phase: Secondary | ICD-10-CM | POA: Diagnosis not present

## 2023-05-12 DIAGNOSIS — I739 Peripheral vascular disease, unspecified: Secondary | ICD-10-CM | POA: Diagnosis not present

## 2023-05-12 DIAGNOSIS — Z7901 Long term (current) use of anticoagulants: Secondary | ICD-10-CM | POA: Diagnosis not present

## 2023-05-12 DIAGNOSIS — J45909 Unspecified asthma, uncomplicated: Secondary | ICD-10-CM | POA: Diagnosis not present

## 2023-05-12 DIAGNOSIS — E1151 Type 2 diabetes mellitus with diabetic peripheral angiopathy without gangrene: Secondary | ICD-10-CM | POA: Diagnosis not present

## 2023-05-12 DIAGNOSIS — I69391 Dysphagia following cerebral infarction: Secondary | ICD-10-CM | POA: Diagnosis not present

## 2023-05-12 DIAGNOSIS — I4891 Unspecified atrial fibrillation: Secondary | ICD-10-CM | POA: Diagnosis not present

## 2023-05-13 DIAGNOSIS — K5900 Constipation, unspecified: Secondary | ICD-10-CM | POA: Diagnosis not present

## 2023-05-13 DIAGNOSIS — E1151 Type 2 diabetes mellitus with diabetic peripheral angiopathy without gangrene: Secondary | ICD-10-CM | POA: Diagnosis not present

## 2023-05-13 DIAGNOSIS — Z7901 Long term (current) use of anticoagulants: Secondary | ICD-10-CM | POA: Diagnosis not present

## 2023-05-13 DIAGNOSIS — I69391 Dysphagia following cerebral infarction: Secondary | ICD-10-CM | POA: Diagnosis not present

## 2023-05-13 DIAGNOSIS — I69354 Hemiplegia and hemiparesis following cerebral infarction affecting left non-dominant side: Secondary | ICD-10-CM | POA: Diagnosis not present

## 2023-05-13 DIAGNOSIS — I2510 Atherosclerotic heart disease of native coronary artery without angina pectoris: Secondary | ICD-10-CM | POA: Diagnosis not present

## 2023-05-13 DIAGNOSIS — Z9181 History of falling: Secondary | ICD-10-CM | POA: Diagnosis not present

## 2023-05-13 DIAGNOSIS — I69322 Dysarthria following cerebral infarction: Secondary | ICD-10-CM | POA: Diagnosis not present

## 2023-05-13 DIAGNOSIS — J4489 Other specified chronic obstructive pulmonary disease: Secondary | ICD-10-CM | POA: Diagnosis not present

## 2023-05-13 DIAGNOSIS — R1312 Dysphagia, oropharyngeal phase: Secondary | ICD-10-CM | POA: Diagnosis not present

## 2023-05-13 DIAGNOSIS — J45909 Unspecified asthma, uncomplicated: Secondary | ICD-10-CM | POA: Diagnosis not present

## 2023-05-13 DIAGNOSIS — I110 Hypertensive heart disease with heart failure: Secondary | ICD-10-CM | POA: Diagnosis not present

## 2023-05-13 DIAGNOSIS — I4891 Unspecified atrial fibrillation: Secondary | ICD-10-CM | POA: Diagnosis not present

## 2023-05-13 DIAGNOSIS — I509 Heart failure, unspecified: Secondary | ICD-10-CM | POA: Diagnosis not present

## 2023-05-13 DIAGNOSIS — I739 Peripheral vascular disease, unspecified: Secondary | ICD-10-CM | POA: Diagnosis not present

## 2023-05-16 DIAGNOSIS — I69354 Hemiplegia and hemiparesis following cerebral infarction affecting left non-dominant side: Secondary | ICD-10-CM | POA: Diagnosis not present

## 2023-05-16 DIAGNOSIS — J45909 Unspecified asthma, uncomplicated: Secondary | ICD-10-CM | POA: Diagnosis not present

## 2023-05-16 DIAGNOSIS — Z9181 History of falling: Secondary | ICD-10-CM | POA: Diagnosis not present

## 2023-05-16 DIAGNOSIS — K5900 Constipation, unspecified: Secondary | ICD-10-CM | POA: Diagnosis not present

## 2023-05-16 DIAGNOSIS — R1312 Dysphagia, oropharyngeal phase: Secondary | ICD-10-CM | POA: Diagnosis not present

## 2023-05-16 DIAGNOSIS — I739 Peripheral vascular disease, unspecified: Secondary | ICD-10-CM | POA: Diagnosis not present

## 2023-05-16 DIAGNOSIS — I4891 Unspecified atrial fibrillation: Secondary | ICD-10-CM | POA: Diagnosis not present

## 2023-05-16 DIAGNOSIS — E1151 Type 2 diabetes mellitus with diabetic peripheral angiopathy without gangrene: Secondary | ICD-10-CM | POA: Diagnosis not present

## 2023-05-16 DIAGNOSIS — I2510 Atherosclerotic heart disease of native coronary artery without angina pectoris: Secondary | ICD-10-CM | POA: Diagnosis not present

## 2023-05-16 DIAGNOSIS — Z7901 Long term (current) use of anticoagulants: Secondary | ICD-10-CM | POA: Diagnosis not present

## 2023-05-16 DIAGNOSIS — I509 Heart failure, unspecified: Secondary | ICD-10-CM | POA: Diagnosis not present

## 2023-05-16 DIAGNOSIS — I69391 Dysphagia following cerebral infarction: Secondary | ICD-10-CM | POA: Diagnosis not present

## 2023-05-16 DIAGNOSIS — I110 Hypertensive heart disease with heart failure: Secondary | ICD-10-CM | POA: Diagnosis not present

## 2023-05-16 DIAGNOSIS — I69322 Dysarthria following cerebral infarction: Secondary | ICD-10-CM | POA: Diagnosis not present

## 2023-05-16 DIAGNOSIS — J4489 Other specified chronic obstructive pulmonary disease: Secondary | ICD-10-CM | POA: Diagnosis not present

## 2023-05-17 DIAGNOSIS — C210 Malignant neoplasm of anus, unspecified: Secondary | ICD-10-CM | POA: Diagnosis not present

## 2023-05-19 DIAGNOSIS — I739 Peripheral vascular disease, unspecified: Secondary | ICD-10-CM | POA: Diagnosis not present

## 2023-05-19 DIAGNOSIS — J45909 Unspecified asthma, uncomplicated: Secondary | ICD-10-CM | POA: Diagnosis not present

## 2023-05-19 DIAGNOSIS — I69391 Dysphagia following cerebral infarction: Secondary | ICD-10-CM | POA: Diagnosis not present

## 2023-05-19 DIAGNOSIS — E1151 Type 2 diabetes mellitus with diabetic peripheral angiopathy without gangrene: Secondary | ICD-10-CM | POA: Diagnosis not present

## 2023-05-19 DIAGNOSIS — I4891 Unspecified atrial fibrillation: Secondary | ICD-10-CM | POA: Diagnosis not present

## 2023-05-19 DIAGNOSIS — K5900 Constipation, unspecified: Secondary | ICD-10-CM | POA: Diagnosis not present

## 2023-05-19 DIAGNOSIS — R1312 Dysphagia, oropharyngeal phase: Secondary | ICD-10-CM | POA: Diagnosis not present

## 2023-05-19 DIAGNOSIS — I69322 Dysarthria following cerebral infarction: Secondary | ICD-10-CM | POA: Diagnosis not present

## 2023-05-19 DIAGNOSIS — I69354 Hemiplegia and hemiparesis following cerebral infarction affecting left non-dominant side: Secondary | ICD-10-CM | POA: Diagnosis not present

## 2023-05-19 DIAGNOSIS — J4489 Other specified chronic obstructive pulmonary disease: Secondary | ICD-10-CM | POA: Diagnosis not present

## 2023-05-19 DIAGNOSIS — Z7901 Long term (current) use of anticoagulants: Secondary | ICD-10-CM | POA: Diagnosis not present

## 2023-05-19 DIAGNOSIS — I509 Heart failure, unspecified: Secondary | ICD-10-CM | POA: Diagnosis not present

## 2023-05-19 DIAGNOSIS — I110 Hypertensive heart disease with heart failure: Secondary | ICD-10-CM | POA: Diagnosis not present

## 2023-05-19 DIAGNOSIS — I2510 Atherosclerotic heart disease of native coronary artery without angina pectoris: Secondary | ICD-10-CM | POA: Diagnosis not present

## 2023-05-19 DIAGNOSIS — Z9181 History of falling: Secondary | ICD-10-CM | POA: Diagnosis not present

## 2023-05-20 ENCOUNTER — Other Ambulatory Visit: Payer: Self-pay

## 2023-05-20 DIAGNOSIS — J45909 Unspecified asthma, uncomplicated: Secondary | ICD-10-CM | POA: Diagnosis not present

## 2023-05-20 DIAGNOSIS — E1151 Type 2 diabetes mellitus with diabetic peripheral angiopathy without gangrene: Secondary | ICD-10-CM | POA: Diagnosis not present

## 2023-05-20 DIAGNOSIS — Z9181 History of falling: Secondary | ICD-10-CM | POA: Diagnosis not present

## 2023-05-20 DIAGNOSIS — I4891 Unspecified atrial fibrillation: Secondary | ICD-10-CM | POA: Diagnosis not present

## 2023-05-20 DIAGNOSIS — R1312 Dysphagia, oropharyngeal phase: Secondary | ICD-10-CM | POA: Diagnosis not present

## 2023-05-20 DIAGNOSIS — I739 Peripheral vascular disease, unspecified: Secondary | ICD-10-CM | POA: Diagnosis not present

## 2023-05-20 DIAGNOSIS — I110 Hypertensive heart disease with heart failure: Secondary | ICD-10-CM | POA: Diagnosis not present

## 2023-05-20 DIAGNOSIS — I69322 Dysarthria following cerebral infarction: Secondary | ICD-10-CM | POA: Diagnosis not present

## 2023-05-20 DIAGNOSIS — I69391 Dysphagia following cerebral infarction: Secondary | ICD-10-CM | POA: Diagnosis not present

## 2023-05-20 DIAGNOSIS — I2510 Atherosclerotic heart disease of native coronary artery without angina pectoris: Secondary | ICD-10-CM | POA: Diagnosis not present

## 2023-05-20 DIAGNOSIS — C210 Malignant neoplasm of anus, unspecified: Secondary | ICD-10-CM

## 2023-05-20 DIAGNOSIS — J4489 Other specified chronic obstructive pulmonary disease: Secondary | ICD-10-CM | POA: Diagnosis not present

## 2023-05-20 DIAGNOSIS — I509 Heart failure, unspecified: Secondary | ICD-10-CM | POA: Diagnosis not present

## 2023-05-20 DIAGNOSIS — Z7901 Long term (current) use of anticoagulants: Secondary | ICD-10-CM | POA: Diagnosis not present

## 2023-05-20 DIAGNOSIS — K5900 Constipation, unspecified: Secondary | ICD-10-CM | POA: Diagnosis not present

## 2023-05-20 DIAGNOSIS — I69354 Hemiplegia and hemiparesis following cerebral infarction affecting left non-dominant side: Secondary | ICD-10-CM | POA: Diagnosis not present

## 2023-05-22 ENCOUNTER — Other Ambulatory Visit: Payer: Self-pay | Admitting: Nurse Practitioner

## 2023-05-22 DIAGNOSIS — C210 Malignant neoplasm of anus, unspecified: Secondary | ICD-10-CM

## 2023-05-22 NOTE — Assessment & Plan Note (Signed)
 Metastatic squamous cell carcinoma involving lymph node with unknown primary site [facility]) -cT1N0M0  -she had metastatic squamous cell carcinoma to left groin lymph nodes in February 2020, PET scan was negative for primary tumor, she underwent definitive radiation. -She was found to have anal cancer in 02/2021 -Status post concurrent chemoradiation, completed in April 2023. -Posttreatment PET scan in July 2023 showed treatment response, no evidence of metastasis. -Surveillance CT scan from March 15, 2022 showed unchanged soft tissue thickening involving the low rectum and anus, no evidence of recurrent disease.  -01/28/2023 - restaging CT CAP - Unchanged circumferential wall thickening of the low rectum and anus. No evidence of lymphadenopathy or metastatic disease in the chest, abdomen, or pelvis. -02/21/2023 - benign exam. Patient scheduled to see Dr. Oren Bracket, colorectal surgeon, tomorrow. Will see her back with labs in 3 months. Will get CT CAP or PET in 6 months or sooner if recommended by surgeon.

## 2023-05-22 NOTE — Progress Notes (Unsigned)
 Patient Care Team: Lorraine Bugler, MD as PCP - General (Family Medicine) Lorraine Moss, MD as Consulting Physician (Neurology) Lorraine Pace, MD as Consulting Physician (Oncology) Lorraine Graham, RN (Inactive) as Oncology Nurse Navigator (Oncology)  Clinic Day:  05/23/2023  Referring physician: Rae Bugler, MD  ASSESSMENT & PLAN:   Assessment & Plan: Anal cancer [facility]) Metastatic squamous cell carcinoma involving lymph node with unknown primary site [facility]) -cT1N0M0  -she had metastatic squamous cell carcinoma to left groin lymph nodes in February 2020, PET scan was negative for primary tumor, she underwent definitive radiation. -She was found to have anal cancer in 02/2021 -Status post concurrent chemoradiation, completed in April 2023. -Posttreatment PET scan in July 2023 showed treatment response, no evidence of metastasis. -Surveillance CT scan from March 15, 2022 showed unchanged soft tissue thickening involving the low rectum and anus, no evidence of recurrent disease.  -01/28/2023 - restaging CT CAP - Unchanged circumferential wall thickening of the low rectum and anus. No evidence of lymphadenopathy or metastatic disease in the chest, abdomen, or pelvis. -02/21/2023 - benign exam. Patient scheduled to see Dr. Barrett Gilbert, colorectal surgeon, tomorrow. Will see her back with labs in 3 months. Will get CT CAP or PET in 6 months. This was ordered today and should be scheduled for 07/2023.  -visit to surgeon in April 2025. No evidence of anal cancer recurrence. He will see her back in 10/2023.  -follow up at cancer center in 3 months and sooner if needed.     CVA  Patient suffered CVA 04/08/2023. Was hospitalized at [facility]. Reviewed CTA and MRI of the brain with the patient and her family member, present with her during today's visit. She had right middle cerebral artery occlusion, into right MCA territory, and left frontal lobe cortex. She is now on eliqus 5 mg twice daily. She has  moderate left sided weakness which involves the face and upper and lower extremities. She is currently receiving PT and rehab at home, after extensive admission in rehab facility. She reports very little progress. She sees neurology on regular basis at [facility]. Will also see cardiology due to atrial fibrillation.   Blood in stool This is likely from increase of eliquis  to 5 mg twice daily. She has seen surgeon, Dr. Barrett Gilbert, as she felt this may be due to cancer recurrence. I reviewed his note and he noted no evidence of rectal cancer recurrence. She was advised to increase the fiber in her diet and use gentle laxatives if needed to prevent hard stools. She will follow up with him in 6 months. She states that since following his instructions, she has had not further concerns and she is having normal bowel movements.   Plan: Reviewed labs.  -Mild anemia with Hgb 11.76. normal Hct and platelet count.  -mild decrease in kidney functions. In genera, stable from prior checks. Encouraged her to increase oral fluid intake.  -CEA pending.  PET/CT for cancer surveillance, ordered today. Should be done in July 2025.  Follow up with Dr. Barrett Gilbert, surgeon, in October 2025. Labs and follow up in 3 months, sooner if needed.   The patient understands the plans discussed today and is in agreement with them.  She knows to contact our office if she develops concerns prior to her next appointment.  I provided 25 minutes of face-to-face time during this encounter and > 50% was spent counseling as documented under my assessment and plan.    Lorraine Deis, NP  [facility] - A DEPT OF [facility] [address] [place] [address] Dept: [phone] Dept Fax: [phone]   Orders Placed This Encounter  Procedures   NM PET Image Restag (PS) Skull Base To Thigh    Standing Status:   Future    Expected Date:   07/23/2023    Expiration Date:    05/22/2024    If indicated for the ordered procedure, I authorize the administration of a radiopharmaceutical per Radiology protocol:   Yes    Preferred imaging location?:   [facility]      CHIEF COMPLAINT:  CC: Anal cancer  Current Treatment: Surveillance  INTERVAL HISTORY:  Lorraine Gilbert is here today for repeat clinical assessment.  She was last seen by me on 02/21/2023.  Doing well in general with improved appetite on Megace .  She saw her surgeon Dr. Barrett Gilbert on 05/17/2023. He saw no evidence of anal cancer recurrence. Recommended high fiber diet and use of gentle laxatives as needed. Will folow up with her in October 2025. Will be due for PET/CT restaging exam in July 2025.   She was admitted 04/08/2023 due to right middle cerebral artery occlusion, right MCA territory, and left frontal lobe cortex. She is now on Eliquis  5 mg twice daily. Atorvastatin was started. She denies fevers or chills. She denies pain. Her appetite is good. Her weight has decreased 5 pounds over last 3 months .  I have reviewed the past medical history, past surgical history, social history and family history with the patient and they are unchanged from previous note.  ALLERGIES:  is allergic to ibuprofen.  MEDICATIONS:  Current Outpatient Medications  Medication Sig Dispense Refill   ACCU-CHEK AVIVA PLUS test strip 1 each as directed.     Accu-Chek Softclix Lancets lancets 1 each by Other route as directed.     ACETAMINOPHEN  8 HOUR PO Take 1 tablet by mouth as needed.     albuterol (PROVENTIL) (2.5 MG/3ML) 0.083% nebulizer solution SMARTSIG:1 Vial(s) Via Nebulizer Every 4-6 Hours PRN     Alcohol Swabs (ALCOHOL WIPES) 70 % PADS DropSafe Alcohol Prep Pads     amiodarone  (PACERONE ) 100 MG tablet Take 100 mg by mouth daily.     Blood Glucose Calibration (ACCU-CHEK AVIVA) SOLN      Blood Glucose Monitoring Suppl (TRUE METRIX AIR GLUCOSE METER) w/Device KIT See admin instructions.     ELIQUIS  2.5 MG TABS tablet Take 2.5 mg  by mouth 2 (two) times daily.     levalbuterol  (XOPENEX ) 1.25 MG/3ML nebulizer solution Inhale into the lungs.     levETIRAcetam  (KEPPRA ) 500 MG tablet Take 1 tablet (500 mg total) by mouth 2 (two) times daily. 180 tablet 3   megestrol  (MEGACE ) 400 MG/10ML suspension Take 10 mLs (400 mg total) by mouth daily. 240 mL 3   nitroGLYCERIN (NITROSTAT) 0.4 MG SL tablet Place 0.4 mg under the tongue every 5 (five) minutes as needed for chest pain.     polyethylene glycol powder (GLYCOLAX /MIRALAX ) 17 GM/SCOOP powder Take by mouth.     TRELEGY ELLIPTA 200-62.5-25 MCG/ACT AEPB Take 1 puff by mouth daily.     No current facility-administered medications for this visit.    HISTORY OF PRESENT ILLNESS:   Oncology History Overview Note   Cancer Staging  Anal cancer [facility]) Staging form: Anus, AJCC V9 - Clinical stage from 01/30/2021: Stage I (cT1, cN0, cM0) - Signed by Lorraine [place], MD on 03/11/2021 Stage  prefix: Initial diagnosis Histologic grade (G): G2 Histologic grading system: 4 grade system     Metastatic squamous cell carcinoma involving lymph node with unknown primary site (HCC)  02/06/2018 Imaging   Impression CT abdomen and pelvis 1.  Enlarged left groin lymph node which is of uncertain etiology. This would be amenable to percutaneous sampling if deemed clinically appropriate. 2.  Hepatic steatosis. Focal area of low-attenuation in the far lateral left hepatic lobe, not clearly seen on the prior exam. Consider liver ultrasound for further evaluation.  3.  Mild L1 compression deformity which is new.   02/28/2018 Pathology Results   Left groin lymph node, needle core biopsy:       -Metastatic squamous cell carcinoma, see comment  P16 stain is strongly positive, suggestive of cervical or anogenital origin in this location. Immunohistochemical stains for CK5, p63, and pan cytokeratin are positive. ER is weakly positive. CK7 and CK20 are negative.   03/15/2018 Pathology Results   1. Cervix, biopsy,  12 o'clock - BENIGN SQUAMOUS MUCOSA WITH INFLAMMATION. - NO DYSPLASIA OR MALIGNANCY. 2. Endocervix, curettage - BENIGN SQUAMOUS EPITHELIUM.   03/23/2018 PET scan   1. Isolated intensely hypermetabolic enlarged LEFT inguinal lymph node. No clear primary identified. 2. No abnormal activity associated with the vagina or anorectal tissue. 3. No hypermetabolic lymph nodes in the pelvis. 4. Two adjacent nodules in the RIGHT upper lobe with very low metabolic activity. These are not favored primary malignancies. Recommend follow-up per Fleischner criteria. Non-contrast chest CT at 6-12 months is recommended.    07/05/2018 - 07/18/2018 Radiation Therapy   Left inguinal area/nodes treated to 30 Gy in 10 fractions   09/22/2020 Imaging   EXAM: CT ABDOMEN PELVIS W IV CONTRAST   IMPRESSION:  1. Multicystic enhancing right adnexal lesion with mild surrounding stranding. Given short interval since recent CT 3 weeks prior, this is more suspicious for acute findings such as torsion or infection, less likely neoplasm. Recommend endovaginal ultrasound for further evaluation and follow-up to resolution.  2. Other stable chronic findings.    01/30/2021 Pathology Results   DIAGNOSIS   SMALL  TUFT OF TISSUE ABOVE ANAL VERGE, ENDOSCOPIC BIOPSIES:   - HIGH-GRADE SQUAMOUS INTRAEPITHELIAL LESION/ANAL INTRAEPITHELIAL NEOPLASIA 2-3 OUT OF 3 (HGSIL/AIN 2-3).   - SEE COMMENT.  ([id number])   COMMENT  MORPHOLOGICALLY THERE IS AT LEAST TWO THIRDS THICKNESS INVOLVEMENT BY DYSPLASTIC EPITHELIUM WITH MID-LEVEL MITOSES SECONDARY HOWEVER DUE TO POOR ORIENTATION AND MORPHOLOGIC ASSESSMENT IS 2 SERVICE MUCOSA IS ONLY PRESENT IN FOCAL AREAS MORPHOLOGICALLY THIS REPRESENTS AT LEAST AIN 2 AND MOST LIKELY AIN 3.  IN EITHER RESPECT THE FINDINGS ARE CONSISTENT WITH HIGH-GRADE SQUAMOUS INTRAEPITHELIAL LESION WHICH IS ALSO CONFIRMED BY STRONGLY BLOCK LIKE P16 IMMUNOHISTOCHEMICAL  POSITIVITY.    03/05/2021 Pathology Results   Final Diagnosis     Rectal mass, biopsy: -Invasive squamous cell carcinoma, moderately differentiated   Addendum 1    The carcinoma was analyzed by [facility] for DNA mismatch repair proteins.  The neoplasm retained nuclear expression of all four mismatch repair proteins, MLH1, PMS2, MSH2, MSH6.  Controls worked appropriately.     Anal cancer (HCC)  01/30/2021 Cancer Staging   Staging form: Anus, AJCC V9 - Clinical stage from 01/30/2021: Stage I (cT1, cN0, cM0) - Signed by Lorraine [place]pp[place], MD on 03/11/2021 Stage prefix: Initial diagnosis Histologic grade (G): G2 Histologic grading system: 4 grade system   03/11/2021 Initial Diagnosis   Anal cancer (HCC)   03/30/2021 - 05/01/2021 Chemotherapy   Patient is on  Treatment Plan : ANUS Mitomycin  D1,28 / 5FU D1-4, 28-31 q32d     03/15/2022 Imaging    IMPRESSION: 1. Unchanged soft tissue thickening involving the low rectum and anus, in keeping with patient's known anal malignancy. 2. No evidence of lymphadenopathy or metastatic disease in the chest, abdomen, or pelvis. 3. Mild pulmonary fibrosis in a pattern with apical to basal gradient, featuring irregular peripheral interstitial opacity, septal thickening, traction bronchiectasis, and small areas of subpleural bronchiolectasis at the lung bases without clear evidence of honeycombing. Findings are consistent with probable UIP pattern fibrosis by ATS pulmonary fibrosis criteria.     01/28/2023 Imaging   CT chest abdomen and pelvis with contrast  IMPRESSION: 1. Unchanged circumferential wall thickening of the low rectum and anus, in keeping with anorectal malignancy. 2. No evidence of lymphadenopathy or metastatic disease in the chest, abdomen, or pelvis. 3. Unchanged mild bibasilar predominant pulmonary fibrosis featuring irregular peripheral interstitial opacity and septal thickening as well as traction bronchiectasis and subpleural bronchiolectasis. Findings remain consistent with probable UIP pattern fibrosis. 4.  Cardiomegaly and coronary artery disease.   Aortic Atherosclerosis (ICD10-I70.0).           REVIEW OF SYSTEMS:   Constitutional: Denies fevers, chills or abnormal weight loss Eyes: Denies blurriness of vision Ears, nose, mouth, throat, and face: Denies mucositis or sore throat. Facial weakness on left side of the face.  Respiratory: Denies cough, dyspnea or wheezes Cardiovascular: Denies palpitation, chest discomfort or lower extremity swelling Gastrointestinal:  Denies nausea, heartburn or change in bowel habits Skin: Denies abnormal skin rashes Lymphatics: Denies new lymphadenopathy or easy bruising Neurological:Denies numbness or tingling. Has severe weakness of left upper and lower extremity. Severe pain of left lower extremity for which she sees pain management.  Behavioral/Psych: Mood is stable, no new changes  All other systems were reviewed with the patient and are negative.   VITALS:   Today's Vitals   05/23/23 0911 05/23/23 0917  BP: 130/60   Pulse: (!) 112   Resp: 17   Temp: 98.1 F (36.7 C)   SpO2: 93%   Weight: 125 lb (56.7 kg)   PainSc:  5    Body mass index is 22.14 kg/m.   Wt Readings from Last 3 Encounters:  05/23/23 125 lb (56.7 kg)  02/21/23 130 lb 3.2 oz (59.1 kg)  07/27/22 131 lb 14.4 oz (59.8 kg)    Body mass index is 22.14 kg/m.  Performance status (ECOG): 2 - Symptomatic, <50% confined to bed  PHYSICAL EXAM:   GENERAL:alert, no distress and comfortable SKIN: skin color, texture, turgor are normal, no rashes or significant lesions EYES: normal, Conjunctiva are pink and non-injected, sclera clear OROPHARYNX:no exudate, no erythema and lips, buccal mucosa, and tongue normal. Facial asymmetry noted.  NECK: supple, thyroid  normal size, non-tender, without nodularity LYMPH:  no palpable lymphadenopathy in the cervical, axillary or inguinal LUNGS: clear to auscultation and percussion with normal breathing effort HEART: regular rate & rhythm  and no murmurs and no lower extremity edema ABDOMEN:abdomen soft, non-tender and normal bowel sounds Musculoskeletal:no cyanosis of digits and no clubbing  NEURO: alert & oriented x 3 with fluent speech, no focal motor/sensory deficits. Moderate weakness of left arm and left leg. Requires additional assistance with all regular activities of daily living.   LABORATORY DATA:  I have reviewed the data as listed    Component Value Date/Time   NA 138 05/23/2023 0852   K 4.2 05/23/2023 0852   CL 107 05/23/2023 0852  CO2 23 05/23/2023 0852   GLUCOSE 284 (H) 05/23/2023 0852   BUN 26 (H) 05/23/2023 0852   CREATININE 1.11 (H) 05/23/2023 0852   CALCIUM 9.5 05/23/2023 0852   PROT 7.4 05/23/2023 0852   ALBUMIN 4.1 05/23/2023 0852   AST 24 05/23/2023 0852   ALT 21 05/23/2023 0852   ALKPHOS 74 05/23/2023 0852   BILITOT 0.4 05/23/2023 0852   GFRNONAA 48 (L) 05/23/2023 0852   GFRAA >60 07/28/2017 1340    Lab Results  Component Value Date   WBC 7.1 05/23/2023   NEUTROABS 5.0 05/23/2023   HGB 11.6 (L) 05/23/2023   HCT 36.1 05/23/2023   MCV 95.5 05/23/2023   PLT 315 05/23/2023

## 2023-05-23 ENCOUNTER — Encounter: Payer: Self-pay | Admitting: Nurse Practitioner

## 2023-05-23 ENCOUNTER — Inpatient Hospital Stay (HOSPITAL_BASED_OUTPATIENT_CLINIC_OR_DEPARTMENT_OTHER): Payer: Medicare Other | Admitting: Nurse Practitioner

## 2023-05-23 ENCOUNTER — Inpatient Hospital Stay: Payer: Medicare Other | Attending: Nurse Practitioner

## 2023-05-23 VITALS — BP 130/60 | HR 112 | Temp 98.1°F | Resp 17 | Wt 125.0 lb

## 2023-05-23 DIAGNOSIS — Z8673 Personal history of transient ischemic attack (TIA), and cerebral infarction without residual deficits: Secondary | ICD-10-CM | POA: Diagnosis not present

## 2023-05-23 DIAGNOSIS — I4891 Unspecified atrial fibrillation: Secondary | ICD-10-CM | POA: Diagnosis not present

## 2023-05-23 DIAGNOSIS — Z85048 Personal history of other malignant neoplasm of rectum, rectosigmoid junction, and anus: Secondary | ICD-10-CM | POA: Insufficient documentation

## 2023-05-23 DIAGNOSIS — E1151 Type 2 diabetes mellitus with diabetic peripheral angiopathy without gangrene: Secondary | ICD-10-CM | POA: Diagnosis not present

## 2023-05-23 DIAGNOSIS — Z7901 Long term (current) use of anticoagulants: Secondary | ICD-10-CM | POA: Insufficient documentation

## 2023-05-23 DIAGNOSIS — I69354 Hemiplegia and hemiparesis following cerebral infarction affecting left non-dominant side: Secondary | ICD-10-CM | POA: Diagnosis not present

## 2023-05-23 DIAGNOSIS — R1312 Dysphagia, oropharyngeal phase: Secondary | ICD-10-CM | POA: Diagnosis not present

## 2023-05-23 DIAGNOSIS — I2510 Atherosclerotic heart disease of native coronary artery without angina pectoris: Secondary | ICD-10-CM | POA: Diagnosis not present

## 2023-05-23 DIAGNOSIS — I69322 Dysarthria following cerebral infarction: Secondary | ICD-10-CM | POA: Diagnosis not present

## 2023-05-23 DIAGNOSIS — Z79899 Other long term (current) drug therapy: Secondary | ICD-10-CM | POA: Diagnosis not present

## 2023-05-23 DIAGNOSIS — Z9181 History of falling: Secondary | ICD-10-CM | POA: Diagnosis not present

## 2023-05-23 DIAGNOSIS — I69391 Dysphagia following cerebral infarction: Secondary | ICD-10-CM | POA: Diagnosis not present

## 2023-05-23 DIAGNOSIS — I110 Hypertensive heart disease with heart failure: Secondary | ICD-10-CM | POA: Diagnosis not present

## 2023-05-23 DIAGNOSIS — I509 Heart failure, unspecified: Secondary | ICD-10-CM | POA: Diagnosis not present

## 2023-05-23 DIAGNOSIS — J4489 Other specified chronic obstructive pulmonary disease: Secondary | ICD-10-CM | POA: Diagnosis not present

## 2023-05-23 DIAGNOSIS — C210 Malignant neoplasm of anus, unspecified: Secondary | ICD-10-CM | POA: Diagnosis not present

## 2023-05-23 DIAGNOSIS — Z9221 Personal history of antineoplastic chemotherapy: Secondary | ICD-10-CM | POA: Diagnosis not present

## 2023-05-23 DIAGNOSIS — J45909 Unspecified asthma, uncomplicated: Secondary | ICD-10-CM | POA: Diagnosis not present

## 2023-05-23 DIAGNOSIS — K5900 Constipation, unspecified: Secondary | ICD-10-CM | POA: Diagnosis not present

## 2023-05-23 DIAGNOSIS — K921 Melena: Secondary | ICD-10-CM | POA: Insufficient documentation

## 2023-05-23 DIAGNOSIS — Z923 Personal history of irradiation: Secondary | ICD-10-CM | POA: Diagnosis not present

## 2023-05-23 DIAGNOSIS — I739 Peripheral vascular disease, unspecified: Secondary | ICD-10-CM | POA: Diagnosis not present

## 2023-05-23 LAB — CBC WITH DIFFERENTIAL (CANCER CENTER ONLY)
Abs Immature Granulocytes: 0.09 10*3/uL — ABNORMAL HIGH (ref 0.00–0.07)
Basophils Absolute: 0 10*3/uL (ref 0.0–0.1)
Basophils Relative: 0 %
Eosinophils Absolute: 0.3 10*3/uL (ref 0.0–0.5)
Eosinophils Relative: 4 %
HCT: 36.1 % (ref 36.0–46.0)
Hemoglobin: 11.6 g/dL — ABNORMAL LOW (ref 12.0–15.0)
Immature Granulocytes: 1 %
Lymphocytes Relative: 16 %
Lymphs Abs: 1.1 10*3/uL (ref 0.7–4.0)
MCH: 30.7 pg (ref 26.0–34.0)
MCHC: 32.1 g/dL (ref 30.0–36.0)
MCV: 95.5 fL (ref 80.0–100.0)
Monocytes Absolute: 0.6 10*3/uL (ref 0.1–1.0)
Monocytes Relative: 8 %
Neutro Abs: 5 10*3/uL (ref 1.7–7.7)
Neutrophils Relative %: 71 %
Platelet Count: 315 10*3/uL (ref 150–400)
RBC: 3.78 MIL/uL — ABNORMAL LOW (ref 3.87–5.11)
RDW: 14.3 % (ref 11.5–15.5)
WBC Count: 7.1 10*3/uL (ref 4.0–10.5)
nRBC: 0 % (ref 0.0–0.2)

## 2023-05-23 LAB — CMP (CANCER CENTER ONLY)
ALT: 21 U/L (ref 0–44)
AST: 24 U/L (ref 15–41)
Albumin: 4.1 g/dL (ref 3.5–5.0)
Alkaline Phosphatase: 74 U/L (ref 38–126)
Anion gap: 8 (ref 5–15)
BUN: 26 mg/dL — ABNORMAL HIGH (ref 8–23)
CO2: 23 mmol/L (ref 22–32)
Calcium: 9.5 mg/dL (ref 8.9–10.3)
Chloride: 107 mmol/L (ref 98–111)
Creatinine: 1.11 mg/dL — ABNORMAL HIGH (ref 0.44–1.00)
GFR, Estimated: 48 mL/min — ABNORMAL LOW (ref >60)
Glucose, Bld: 284 mg/dL — ABNORMAL HIGH (ref 70–99)
Potassium: 4.2 mmol/L (ref 3.5–5.1)
Sodium: 138 mmol/L (ref 135–145)
Total Bilirubin: 0.4 mg/dL (ref 0.0–1.2)
Total Protein: 7.4 g/dL (ref 6.5–8.1)

## 2023-05-23 LAB — CEA (ACCESS): CEA (CHCC): 3.52 ng/mL (ref 0.00–5.00)

## 2023-05-25 DIAGNOSIS — I480 Paroxysmal atrial fibrillation: Secondary | ICD-10-CM | POA: Diagnosis not present

## 2023-05-25 DIAGNOSIS — I2510 Atherosclerotic heart disease of native coronary artery without angina pectoris: Secondary | ICD-10-CM | POA: Diagnosis not present

## 2023-05-25 DIAGNOSIS — I739 Peripheral vascular disease, unspecified: Secondary | ICD-10-CM | POA: Diagnosis not present

## 2023-05-26 DIAGNOSIS — R569 Unspecified convulsions: Secondary | ICD-10-CM | POA: Diagnosis not present

## 2023-05-26 DIAGNOSIS — Z7409 Other reduced mobility: Secondary | ICD-10-CM | POA: Diagnosis not present

## 2023-05-27 DIAGNOSIS — I739 Peripheral vascular disease, unspecified: Secondary | ICD-10-CM | POA: Diagnosis not present

## 2023-05-27 DIAGNOSIS — I509 Heart failure, unspecified: Secondary | ICD-10-CM | POA: Diagnosis not present

## 2023-05-27 DIAGNOSIS — I69322 Dysarthria following cerebral infarction: Secondary | ICD-10-CM | POA: Diagnosis not present

## 2023-05-27 DIAGNOSIS — Z9181 History of falling: Secondary | ICD-10-CM | POA: Diagnosis not present

## 2023-05-27 DIAGNOSIS — J4489 Other specified chronic obstructive pulmonary disease: Secondary | ICD-10-CM | POA: Diagnosis not present

## 2023-05-27 DIAGNOSIS — I110 Hypertensive heart disease with heart failure: Secondary | ICD-10-CM | POA: Diagnosis not present

## 2023-05-27 DIAGNOSIS — K5900 Constipation, unspecified: Secondary | ICD-10-CM | POA: Diagnosis not present

## 2023-05-27 DIAGNOSIS — I2510 Atherosclerotic heart disease of native coronary artery without angina pectoris: Secondary | ICD-10-CM | POA: Diagnosis not present

## 2023-05-27 DIAGNOSIS — I69354 Hemiplegia and hemiparesis following cerebral infarction affecting left non-dominant side: Secondary | ICD-10-CM | POA: Diagnosis not present

## 2023-05-27 DIAGNOSIS — J45909 Unspecified asthma, uncomplicated: Secondary | ICD-10-CM | POA: Diagnosis not present

## 2023-05-27 DIAGNOSIS — I69391 Dysphagia following cerebral infarction: Secondary | ICD-10-CM | POA: Diagnosis not present

## 2023-05-27 DIAGNOSIS — I4891 Unspecified atrial fibrillation: Secondary | ICD-10-CM | POA: Diagnosis not present

## 2023-05-27 DIAGNOSIS — E1151 Type 2 diabetes mellitus with diabetic peripheral angiopathy without gangrene: Secondary | ICD-10-CM | POA: Diagnosis not present

## 2023-05-27 DIAGNOSIS — Z7901 Long term (current) use of anticoagulants: Secondary | ICD-10-CM | POA: Diagnosis not present

## 2023-05-27 DIAGNOSIS — R1312 Dysphagia, oropharyngeal phase: Secondary | ICD-10-CM | POA: Diagnosis not present

## 2023-05-30 DIAGNOSIS — I2510 Atherosclerotic heart disease of native coronary artery without angina pectoris: Secondary | ICD-10-CM | POA: Diagnosis not present

## 2023-05-30 DIAGNOSIS — M533 Sacrococcygeal disorders, not elsewhere classified: Secondary | ICD-10-CM | POA: Diagnosis not present

## 2023-05-30 DIAGNOSIS — M7918 Myalgia, other site: Secondary | ICD-10-CM | POA: Diagnosis not present

## 2023-05-30 DIAGNOSIS — Z79899 Other long term (current) drug therapy: Secondary | ICD-10-CM | POA: Diagnosis not present

## 2023-05-30 DIAGNOSIS — I110 Hypertensive heart disease with heart failure: Secondary | ICD-10-CM | POA: Diagnosis not present

## 2023-05-30 DIAGNOSIS — I69391 Dysphagia following cerebral infarction: Secondary | ICD-10-CM | POA: Diagnosis not present

## 2023-05-30 DIAGNOSIS — Z9181 History of falling: Secondary | ICD-10-CM | POA: Diagnosis not present

## 2023-05-30 DIAGNOSIS — K5900 Constipation, unspecified: Secondary | ICD-10-CM | POA: Diagnosis not present

## 2023-05-30 DIAGNOSIS — J45909 Unspecified asthma, uncomplicated: Secondary | ICD-10-CM | POA: Diagnosis not present

## 2023-05-30 DIAGNOSIS — I4891 Unspecified atrial fibrillation: Secondary | ICD-10-CM | POA: Diagnosis not present

## 2023-05-30 DIAGNOSIS — M791 Myalgia, unspecified site: Secondary | ICD-10-CM | POA: Diagnosis not present

## 2023-05-30 DIAGNOSIS — I69354 Hemiplegia and hemiparesis following cerebral infarction affecting left non-dominant side: Secondary | ICD-10-CM | POA: Diagnosis not present

## 2023-05-30 DIAGNOSIS — I69322 Dysarthria following cerebral infarction: Secondary | ICD-10-CM | POA: Diagnosis not present

## 2023-05-30 DIAGNOSIS — J4489 Other specified chronic obstructive pulmonary disease: Secondary | ICD-10-CM | POA: Diagnosis not present

## 2023-05-30 DIAGNOSIS — Z7901 Long term (current) use of anticoagulants: Secondary | ICD-10-CM | POA: Diagnosis not present

## 2023-05-30 DIAGNOSIS — R1312 Dysphagia, oropharyngeal phase: Secondary | ICD-10-CM | POA: Diagnosis not present

## 2023-05-30 DIAGNOSIS — E1151 Type 2 diabetes mellitus with diabetic peripheral angiopathy without gangrene: Secondary | ICD-10-CM | POA: Diagnosis not present

## 2023-05-30 DIAGNOSIS — I739 Peripheral vascular disease, unspecified: Secondary | ICD-10-CM | POA: Diagnosis not present

## 2023-05-30 DIAGNOSIS — I509 Heart failure, unspecified: Secondary | ICD-10-CM | POA: Diagnosis not present

## 2023-06-02 DIAGNOSIS — Z7901 Long term (current) use of anticoagulants: Secondary | ICD-10-CM | POA: Diagnosis not present

## 2023-06-02 DIAGNOSIS — J4489 Other specified chronic obstructive pulmonary disease: Secondary | ICD-10-CM | POA: Diagnosis not present

## 2023-06-02 DIAGNOSIS — I2510 Atherosclerotic heart disease of native coronary artery without angina pectoris: Secondary | ICD-10-CM | POA: Diagnosis not present

## 2023-06-02 DIAGNOSIS — R1312 Dysphagia, oropharyngeal phase: Secondary | ICD-10-CM | POA: Diagnosis not present

## 2023-06-02 DIAGNOSIS — J45909 Unspecified asthma, uncomplicated: Secondary | ICD-10-CM | POA: Diagnosis not present

## 2023-06-02 DIAGNOSIS — E1151 Type 2 diabetes mellitus with diabetic peripheral angiopathy without gangrene: Secondary | ICD-10-CM | POA: Diagnosis not present

## 2023-06-02 DIAGNOSIS — I509 Heart failure, unspecified: Secondary | ICD-10-CM | POA: Diagnosis not present

## 2023-06-02 DIAGNOSIS — I110 Hypertensive heart disease with heart failure: Secondary | ICD-10-CM | POA: Diagnosis not present

## 2023-06-02 DIAGNOSIS — K5900 Constipation, unspecified: Secondary | ICD-10-CM | POA: Diagnosis not present

## 2023-06-02 DIAGNOSIS — I739 Peripheral vascular disease, unspecified: Secondary | ICD-10-CM | POA: Diagnosis not present

## 2023-06-02 DIAGNOSIS — I4891 Unspecified atrial fibrillation: Secondary | ICD-10-CM | POA: Diagnosis not present

## 2023-06-02 DIAGNOSIS — I69354 Hemiplegia and hemiparesis following cerebral infarction affecting left non-dominant side: Secondary | ICD-10-CM | POA: Diagnosis not present

## 2023-06-02 DIAGNOSIS — I69322 Dysarthria following cerebral infarction: Secondary | ICD-10-CM | POA: Diagnosis not present

## 2023-06-02 DIAGNOSIS — Z9181 History of falling: Secondary | ICD-10-CM | POA: Diagnosis not present

## 2023-06-02 DIAGNOSIS — I69391 Dysphagia following cerebral infarction: Secondary | ICD-10-CM | POA: Diagnosis not present

## 2023-06-07 DIAGNOSIS — Z9181 History of falling: Secondary | ICD-10-CM | POA: Diagnosis not present

## 2023-06-07 DIAGNOSIS — I110 Hypertensive heart disease with heart failure: Secondary | ICD-10-CM | POA: Diagnosis not present

## 2023-06-07 DIAGNOSIS — I69391 Dysphagia following cerebral infarction: Secondary | ICD-10-CM | POA: Diagnosis not present

## 2023-06-07 DIAGNOSIS — I2510 Atherosclerotic heart disease of native coronary artery without angina pectoris: Secondary | ICD-10-CM | POA: Diagnosis not present

## 2023-06-07 DIAGNOSIS — I509 Heart failure, unspecified: Secondary | ICD-10-CM | POA: Diagnosis not present

## 2023-06-07 DIAGNOSIS — K5900 Constipation, unspecified: Secondary | ICD-10-CM | POA: Diagnosis not present

## 2023-06-07 DIAGNOSIS — R1312 Dysphagia, oropharyngeal phase: Secondary | ICD-10-CM | POA: Diagnosis not present

## 2023-06-07 DIAGNOSIS — I4891 Unspecified atrial fibrillation: Secondary | ICD-10-CM | POA: Diagnosis not present

## 2023-06-07 DIAGNOSIS — J45909 Unspecified asthma, uncomplicated: Secondary | ICD-10-CM | POA: Diagnosis not present

## 2023-06-07 DIAGNOSIS — E1151 Type 2 diabetes mellitus with diabetic peripheral angiopathy without gangrene: Secondary | ICD-10-CM | POA: Diagnosis not present

## 2023-06-07 DIAGNOSIS — J4489 Other specified chronic obstructive pulmonary disease: Secondary | ICD-10-CM | POA: Diagnosis not present

## 2023-06-07 DIAGNOSIS — I739 Peripheral vascular disease, unspecified: Secondary | ICD-10-CM | POA: Diagnosis not present

## 2023-06-07 DIAGNOSIS — I69354 Hemiplegia and hemiparesis following cerebral infarction affecting left non-dominant side: Secondary | ICD-10-CM | POA: Diagnosis not present

## 2023-06-07 DIAGNOSIS — I69322 Dysarthria following cerebral infarction: Secondary | ICD-10-CM | POA: Diagnosis not present

## 2023-06-07 DIAGNOSIS — Z7901 Long term (current) use of anticoagulants: Secondary | ICD-10-CM | POA: Diagnosis not present

## 2023-06-08 DIAGNOSIS — B349 Viral infection, unspecified: Secondary | ICD-10-CM | POA: Diagnosis not present

## 2023-06-13 DIAGNOSIS — K5900 Constipation, unspecified: Secondary | ICD-10-CM | POA: Diagnosis not present

## 2023-06-13 DIAGNOSIS — Z9181 History of falling: Secondary | ICD-10-CM | POA: Diagnosis not present

## 2023-06-13 DIAGNOSIS — I739 Peripheral vascular disease, unspecified: Secondary | ICD-10-CM | POA: Diagnosis not present

## 2023-06-13 DIAGNOSIS — R1312 Dysphagia, oropharyngeal phase: Secondary | ICD-10-CM | POA: Diagnosis not present

## 2023-06-13 DIAGNOSIS — J45909 Unspecified asthma, uncomplicated: Secondary | ICD-10-CM | POA: Diagnosis not present

## 2023-06-13 DIAGNOSIS — I110 Hypertensive heart disease with heart failure: Secondary | ICD-10-CM | POA: Diagnosis not present

## 2023-06-13 DIAGNOSIS — I69322 Dysarthria following cerebral infarction: Secondary | ICD-10-CM | POA: Diagnosis not present

## 2023-06-13 DIAGNOSIS — I69354 Hemiplegia and hemiparesis following cerebral infarction affecting left non-dominant side: Secondary | ICD-10-CM | POA: Diagnosis not present

## 2023-06-13 DIAGNOSIS — I4891 Unspecified atrial fibrillation: Secondary | ICD-10-CM | POA: Diagnosis not present

## 2023-06-13 DIAGNOSIS — I509 Heart failure, unspecified: Secondary | ICD-10-CM | POA: Diagnosis not present

## 2023-06-13 DIAGNOSIS — Z7901 Long term (current) use of anticoagulants: Secondary | ICD-10-CM | POA: Diagnosis not present

## 2023-06-13 DIAGNOSIS — J4489 Other specified chronic obstructive pulmonary disease: Secondary | ICD-10-CM | POA: Diagnosis not present

## 2023-06-13 DIAGNOSIS — E1151 Type 2 diabetes mellitus with diabetic peripheral angiopathy without gangrene: Secondary | ICD-10-CM | POA: Diagnosis not present

## 2023-06-13 DIAGNOSIS — I69391 Dysphagia following cerebral infarction: Secondary | ICD-10-CM | POA: Diagnosis not present

## 2023-06-13 DIAGNOSIS — I2510 Atherosclerotic heart disease of native coronary artery without angina pectoris: Secondary | ICD-10-CM | POA: Diagnosis not present

## 2023-06-14 DIAGNOSIS — Z7409 Other reduced mobility: Secondary | ICD-10-CM | POA: Diagnosis not present

## 2023-06-14 DIAGNOSIS — Z8669 Personal history of other diseases of the nervous system and sense organs: Secondary | ICD-10-CM | POA: Diagnosis not present

## 2023-06-14 DIAGNOSIS — K6289 Other specified diseases of anus and rectum: Secondary | ICD-10-CM | POA: Diagnosis not present

## 2023-06-14 DIAGNOSIS — E785 Hyperlipidemia, unspecified: Secondary | ICD-10-CM | POA: Diagnosis not present

## 2023-06-14 DIAGNOSIS — Z8673 Personal history of transient ischemic attack (TIA), and cerebral infarction without residual deficits: Secondary | ICD-10-CM | POA: Diagnosis not present

## 2023-06-14 DIAGNOSIS — Z7901 Long term (current) use of anticoagulants: Secondary | ICD-10-CM | POA: Diagnosis not present

## 2023-06-14 DIAGNOSIS — R29818 Other symptoms and signs involving the nervous system: Secondary | ICD-10-CM | POA: Diagnosis not present

## 2023-06-14 DIAGNOSIS — I672 Cerebral atherosclerosis: Secondary | ICD-10-CM | POA: Diagnosis not present

## 2023-06-14 DIAGNOSIS — Z789 Other specified health status: Secondary | ICD-10-CM | POA: Diagnosis not present

## 2023-06-14 DIAGNOSIS — I639 Cerebral infarction, unspecified: Secondary | ICD-10-CM | POA: Diagnosis not present

## 2023-06-14 DIAGNOSIS — R569 Unspecified convulsions: Secondary | ICD-10-CM | POA: Diagnosis not present

## 2023-06-14 DIAGNOSIS — E119 Type 2 diabetes mellitus without complications: Secondary | ICD-10-CM | POA: Diagnosis not present

## 2023-06-14 DIAGNOSIS — Z85528 Personal history of other malignant neoplasm of kidney: Secondary | ICD-10-CM | POA: Diagnosis not present

## 2023-06-14 DIAGNOSIS — I10 Essential (primary) hypertension: Secondary | ICD-10-CM | POA: Diagnosis not present

## 2023-06-14 DIAGNOSIS — R197 Diarrhea, unspecified: Secondary | ICD-10-CM | POA: Diagnosis not present

## 2023-06-14 DIAGNOSIS — I4891 Unspecified atrial fibrillation: Secondary | ICD-10-CM | POA: Diagnosis not present

## 2023-06-15 DIAGNOSIS — K6289 Other specified diseases of anus and rectum: Secondary | ICD-10-CM | POA: Diagnosis not present

## 2023-06-16 DIAGNOSIS — M533 Sacrococcygeal disorders, not elsewhere classified: Secondary | ICD-10-CM | POA: Diagnosis not present

## 2023-06-20 DIAGNOSIS — J4489 Other specified chronic obstructive pulmonary disease: Secondary | ICD-10-CM | POA: Diagnosis not present

## 2023-06-20 DIAGNOSIS — I69322 Dysarthria following cerebral infarction: Secondary | ICD-10-CM | POA: Diagnosis not present

## 2023-06-20 DIAGNOSIS — E1151 Type 2 diabetes mellitus with diabetic peripheral angiopathy without gangrene: Secondary | ICD-10-CM | POA: Diagnosis not present

## 2023-06-20 DIAGNOSIS — I110 Hypertensive heart disease with heart failure: Secondary | ICD-10-CM | POA: Diagnosis not present

## 2023-06-20 DIAGNOSIS — I69354 Hemiplegia and hemiparesis following cerebral infarction affecting left non-dominant side: Secondary | ICD-10-CM | POA: Diagnosis not present

## 2023-06-20 DIAGNOSIS — I739 Peripheral vascular disease, unspecified: Secondary | ICD-10-CM | POA: Diagnosis not present

## 2023-06-20 DIAGNOSIS — Z7901 Long term (current) use of anticoagulants: Secondary | ICD-10-CM | POA: Diagnosis not present

## 2023-06-20 DIAGNOSIS — I4891 Unspecified atrial fibrillation: Secondary | ICD-10-CM | POA: Diagnosis not present

## 2023-06-20 DIAGNOSIS — I509 Heart failure, unspecified: Secondary | ICD-10-CM | POA: Diagnosis not present

## 2023-06-20 DIAGNOSIS — I2510 Atherosclerotic heart disease of native coronary artery without angina pectoris: Secondary | ICD-10-CM | POA: Diagnosis not present

## 2023-06-20 DIAGNOSIS — I69391 Dysphagia following cerebral infarction: Secondary | ICD-10-CM | POA: Diagnosis not present

## 2023-06-20 DIAGNOSIS — R1312 Dysphagia, oropharyngeal phase: Secondary | ICD-10-CM | POA: Diagnosis not present

## 2023-06-20 DIAGNOSIS — Z9181 History of falling: Secondary | ICD-10-CM | POA: Diagnosis not present

## 2023-06-20 DIAGNOSIS — K5900 Constipation, unspecified: Secondary | ICD-10-CM | POA: Diagnosis not present

## 2023-06-20 DIAGNOSIS — J45909 Unspecified asthma, uncomplicated: Secondary | ICD-10-CM | POA: Diagnosis not present

## 2023-06-24 DIAGNOSIS — K649 Unspecified hemorrhoids: Secondary | ICD-10-CM | POA: Diagnosis not present

## 2023-06-24 DIAGNOSIS — C210 Malignant neoplasm of anus, unspecified: Secondary | ICD-10-CM | POA: Diagnosis not present

## 2023-06-24 DIAGNOSIS — C779 Secondary and unspecified malignant neoplasm of lymph node, unspecified: Secondary | ICD-10-CM | POA: Diagnosis not present

## 2023-06-24 DIAGNOSIS — E039 Hypothyroidism, unspecified: Secondary | ICD-10-CM | POA: Diagnosis not present

## 2023-06-24 DIAGNOSIS — R64 Cachexia: Secondary | ICD-10-CM | POA: Diagnosis not present

## 2023-06-24 DIAGNOSIS — C801 Malignant (primary) neoplasm, unspecified: Secondary | ICD-10-CM | POA: Diagnosis not present

## 2023-06-24 DIAGNOSIS — E7800 Pure hypercholesterolemia, unspecified: Secondary | ICD-10-CM | POA: Diagnosis not present

## 2023-06-26 DIAGNOSIS — R569 Unspecified convulsions: Secondary | ICD-10-CM | POA: Diagnosis not present

## 2023-06-26 DIAGNOSIS — Z7409 Other reduced mobility: Secondary | ICD-10-CM | POA: Diagnosis not present

## 2023-06-28 DIAGNOSIS — J45909 Unspecified asthma, uncomplicated: Secondary | ICD-10-CM | POA: Diagnosis not present

## 2023-06-28 DIAGNOSIS — I4891 Unspecified atrial fibrillation: Secondary | ICD-10-CM | POA: Diagnosis not present

## 2023-06-28 DIAGNOSIS — I69391 Dysphagia following cerebral infarction: Secondary | ICD-10-CM | POA: Diagnosis not present

## 2023-06-28 DIAGNOSIS — I2510 Atherosclerotic heart disease of native coronary artery without angina pectoris: Secondary | ICD-10-CM | POA: Diagnosis not present

## 2023-06-28 DIAGNOSIS — K5900 Constipation, unspecified: Secondary | ICD-10-CM | POA: Diagnosis not present

## 2023-06-28 DIAGNOSIS — J4489 Other specified chronic obstructive pulmonary disease: Secondary | ICD-10-CM | POA: Diagnosis not present

## 2023-06-28 DIAGNOSIS — E1151 Type 2 diabetes mellitus with diabetic peripheral angiopathy without gangrene: Secondary | ICD-10-CM | POA: Diagnosis not present

## 2023-06-28 DIAGNOSIS — I110 Hypertensive heart disease with heart failure: Secondary | ICD-10-CM | POA: Diagnosis not present

## 2023-06-28 DIAGNOSIS — I69354 Hemiplegia and hemiparesis following cerebral infarction affecting left non-dominant side: Secondary | ICD-10-CM | POA: Diagnosis not present

## 2023-06-28 DIAGNOSIS — Z9181 History of falling: Secondary | ICD-10-CM | POA: Diagnosis not present

## 2023-06-28 DIAGNOSIS — I739 Peripheral vascular disease, unspecified: Secondary | ICD-10-CM | POA: Diagnosis not present

## 2023-06-28 DIAGNOSIS — I509 Heart failure, unspecified: Secondary | ICD-10-CM | POA: Diagnosis not present

## 2023-06-28 DIAGNOSIS — R1312 Dysphagia, oropharyngeal phase: Secondary | ICD-10-CM | POA: Diagnosis not present

## 2023-06-28 DIAGNOSIS — Z7901 Long term (current) use of anticoagulants: Secondary | ICD-10-CM | POA: Diagnosis not present

## 2023-06-28 DIAGNOSIS — I69322 Dysarthria following cerebral infarction: Secondary | ICD-10-CM | POA: Diagnosis not present

## 2023-06-29 DIAGNOSIS — I739 Peripheral vascular disease, unspecified: Secondary | ICD-10-CM | POA: Diagnosis not present

## 2023-06-29 DIAGNOSIS — I25119 Atherosclerotic heart disease of native coronary artery with unspecified angina pectoris: Secondary | ICD-10-CM | POA: Diagnosis not present

## 2023-06-29 DIAGNOSIS — I4891 Unspecified atrial fibrillation: Secondary | ICD-10-CM | POA: Diagnosis not present

## 2023-06-29 DIAGNOSIS — E1151 Type 2 diabetes mellitus with diabetic peripheral angiopathy without gangrene: Secondary | ICD-10-CM | POA: Diagnosis not present

## 2023-07-04 DIAGNOSIS — I2510 Atherosclerotic heart disease of native coronary artery without angina pectoris: Secondary | ICD-10-CM | POA: Diagnosis not present

## 2023-07-04 DIAGNOSIS — I69354 Hemiplegia and hemiparesis following cerebral infarction affecting left non-dominant side: Secondary | ICD-10-CM | POA: Diagnosis not present

## 2023-07-04 DIAGNOSIS — I69322 Dysarthria following cerebral infarction: Secondary | ICD-10-CM | POA: Diagnosis not present

## 2023-07-04 DIAGNOSIS — K5900 Constipation, unspecified: Secondary | ICD-10-CM | POA: Diagnosis not present

## 2023-07-04 DIAGNOSIS — I509 Heart failure, unspecified: Secondary | ICD-10-CM | POA: Diagnosis not present

## 2023-07-04 DIAGNOSIS — I69391 Dysphagia following cerebral infarction: Secondary | ICD-10-CM | POA: Diagnosis not present

## 2023-07-04 DIAGNOSIS — Z9181 History of falling: Secondary | ICD-10-CM | POA: Diagnosis not present

## 2023-07-04 DIAGNOSIS — Z7901 Long term (current) use of anticoagulants: Secondary | ICD-10-CM | POA: Diagnosis not present

## 2023-07-04 DIAGNOSIS — I739 Peripheral vascular disease, unspecified: Secondary | ICD-10-CM | POA: Diagnosis not present

## 2023-07-04 DIAGNOSIS — E1151 Type 2 diabetes mellitus with diabetic peripheral angiopathy without gangrene: Secondary | ICD-10-CM | POA: Diagnosis not present

## 2023-07-04 DIAGNOSIS — I4891 Unspecified atrial fibrillation: Secondary | ICD-10-CM | POA: Diagnosis not present

## 2023-07-04 DIAGNOSIS — R1312 Dysphagia, oropharyngeal phase: Secondary | ICD-10-CM | POA: Diagnosis not present

## 2023-07-04 DIAGNOSIS — J4489 Other specified chronic obstructive pulmonary disease: Secondary | ICD-10-CM | POA: Diagnosis not present

## 2023-07-04 DIAGNOSIS — I110 Hypertensive heart disease with heart failure: Secondary | ICD-10-CM | POA: Diagnosis not present

## 2023-07-04 DIAGNOSIS — J45909 Unspecified asthma, uncomplicated: Secondary | ICD-10-CM | POA: Diagnosis not present

## 2023-07-05 DIAGNOSIS — C210 Malignant neoplasm of anus, unspecified: Secondary | ICD-10-CM | POA: Diagnosis not present

## 2023-07-08 ENCOUNTER — Ambulatory Visit (HOSPITAL_COMMUNITY)
Admission: RE | Admit: 2023-07-08 | Discharge: 2023-07-08 | Disposition: A | Discharge status: Home / Self Care | Source: Ambulatory Visit | Attending: Nurse Practitioner | Admitting: Nurse Practitioner

## 2023-07-08 DIAGNOSIS — C189 Malignant neoplasm of colon, unspecified: Secondary | ICD-10-CM | POA: Diagnosis not present

## 2023-07-08 DIAGNOSIS — C210 Malignant neoplasm of anus, unspecified: Secondary | ICD-10-CM | POA: Diagnosis not present

## 2023-07-08 LAB — GLUCOSE, CAPILLARY: Glucose-Capillary: 93 mg/dL (ref 70–99)

## 2023-07-08 MED ORDER — FLUDEOXYGLUCOSE F - 18 (FDG) INJECTION
6.5600 | Freq: Once | INTRAVENOUS | Status: AC
  Administered 2023-07-08: 6.56 via INTRAVENOUS

## 2023-07-13 DIAGNOSIS — J4489 Other specified chronic obstructive pulmonary disease: Secondary | ICD-10-CM | POA: Diagnosis not present

## 2023-07-13 DIAGNOSIS — I69322 Dysarthria following cerebral infarction: Secondary | ICD-10-CM | POA: Diagnosis not present

## 2023-07-13 DIAGNOSIS — I4891 Unspecified atrial fibrillation: Secondary | ICD-10-CM | POA: Diagnosis not present

## 2023-07-13 DIAGNOSIS — I2510 Atherosclerotic heart disease of native coronary artery without angina pectoris: Secondary | ICD-10-CM | POA: Diagnosis not present

## 2023-07-13 DIAGNOSIS — R1312 Dysphagia, oropharyngeal phase: Secondary | ICD-10-CM | POA: Diagnosis not present

## 2023-07-13 DIAGNOSIS — I110 Hypertensive heart disease with heart failure: Secondary | ICD-10-CM | POA: Diagnosis not present

## 2023-07-13 DIAGNOSIS — J45909 Unspecified asthma, uncomplicated: Secondary | ICD-10-CM | POA: Diagnosis not present

## 2023-07-13 DIAGNOSIS — Z7901 Long term (current) use of anticoagulants: Secondary | ICD-10-CM | POA: Diagnosis not present

## 2023-07-13 DIAGNOSIS — I69354 Hemiplegia and hemiparesis following cerebral infarction affecting left non-dominant side: Secondary | ICD-10-CM | POA: Diagnosis not present

## 2023-07-13 DIAGNOSIS — I509 Heart failure, unspecified: Secondary | ICD-10-CM | POA: Diagnosis not present

## 2023-07-13 DIAGNOSIS — K5900 Constipation, unspecified: Secondary | ICD-10-CM | POA: Diagnosis not present

## 2023-07-13 DIAGNOSIS — Z9181 History of falling: Secondary | ICD-10-CM | POA: Diagnosis not present

## 2023-07-13 DIAGNOSIS — I739 Peripheral vascular disease, unspecified: Secondary | ICD-10-CM | POA: Diagnosis not present

## 2023-07-13 DIAGNOSIS — E1151 Type 2 diabetes mellitus with diabetic peripheral angiopathy without gangrene: Secondary | ICD-10-CM | POA: Diagnosis not present

## 2023-07-13 DIAGNOSIS — I69391 Dysphagia following cerebral infarction: Secondary | ICD-10-CM | POA: Diagnosis not present

## 2023-07-14 ENCOUNTER — Telehealth: Payer: Self-pay

## 2023-07-14 ENCOUNTER — Other Ambulatory Visit: Payer: Self-pay

## 2023-07-14 NOTE — Telephone Encounter (Signed)
 Patient called in stating she is in need of something to help with her appetite. She was taking Megace  when she was in the hospital in March and they told her to stop taking it. She was weighed by her PT and stated she has lost 11 lbs. She wanted to know if there was anything else she could take to increase her appetite. Informed patient I would let Dr. Lanny know and would call her back after doctor gets back to me.

## 2023-07-15 ENCOUNTER — Telehealth: Payer: Self-pay | Admitting: Nurse Practitioner

## 2023-07-15 NOTE — Telephone Encounter (Signed)
 Rescheduled appointments per 6/27 secure chat from [facility] NP. Called and left a VM with appointment details.

## 2023-07-18 DIAGNOSIS — I739 Peripheral vascular disease, unspecified: Secondary | ICD-10-CM | POA: Diagnosis not present

## 2023-07-18 DIAGNOSIS — Z85048 Personal history of other malignant neoplasm of rectum, rectosigmoid junction, and anus: Secondary | ICD-10-CM | POA: Diagnosis not present

## 2023-07-18 DIAGNOSIS — E1151 Type 2 diabetes mellitus with diabetic peripheral angiopathy without gangrene: Secondary | ICD-10-CM | POA: Diagnosis not present

## 2023-07-18 DIAGNOSIS — I25119 Atherosclerotic heart disease of native coronary artery with unspecified angina pectoris: Secondary | ICD-10-CM | POA: Diagnosis not present

## 2023-07-18 DIAGNOSIS — I4891 Unspecified atrial fibrillation: Secondary | ICD-10-CM | POA: Diagnosis not present

## 2023-07-18 DIAGNOSIS — E7800 Pure hypercholesterolemia, unspecified: Secondary | ICD-10-CM | POA: Diagnosis not present

## 2023-07-19 ENCOUNTER — Other Ambulatory Visit: Payer: Self-pay | Admitting: Nurse Practitioner

## 2023-07-19 DIAGNOSIS — M5441 Lumbago with sciatica, right side: Secondary | ICD-10-CM | POA: Diagnosis not present

## 2023-07-19 DIAGNOSIS — G8929 Other chronic pain: Secondary | ICD-10-CM | POA: Diagnosis not present

## 2023-07-19 DIAGNOSIS — M5442 Lumbago with sciatica, left side: Secondary | ICD-10-CM | POA: Diagnosis not present

## 2023-07-19 DIAGNOSIS — C210 Malignant neoplasm of anus, unspecified: Secondary | ICD-10-CM

## 2023-07-19 DIAGNOSIS — Z7901 Long term (current) use of anticoagulants: Secondary | ICD-10-CM | POA: Diagnosis not present

## 2023-07-19 DIAGNOSIS — M7918 Myalgia, other site: Secondary | ICD-10-CM | POA: Diagnosis not present

## 2023-07-19 DIAGNOSIS — Z79899 Other long term (current) drug therapy: Secondary | ICD-10-CM | POA: Diagnosis not present

## 2023-07-19 NOTE — Progress Notes (Signed)
 Patient Care Team: Seabron Lenis, MD as PCP - General (Family Medicine) Georjean Darice HERO, MD as Consulting Physician (Neurology) Lanny Callander, MD as Consulting Physician (Oncology) Detra Darice LABOR, RN (Inactive) as Oncology Nurse Navigator (Oncology)  Clinic Day:  07/20/2023  Referring physician: Seabron Lenis, MD  ASSESSMENT & PLAN:   Assessment & Plan: Anal cancer [facility]) Metastatic squamous cell carcinoma involving lymph node with unknown primary site [facility]) -cT1N0M0  -she had metastatic squamous cell carcinoma to left groin lymph nodes in February 2020, PET scan was negative for primary tumor, she underwent definitive radiation. -She was found to have anal cancer in 02/2021 -Status post concurrent chemoradiation, completed in April 2023. -Posttreatment PET scan in July 2023 showed treatment response, no evidence of metastasis. -Surveillance CT scan from March 15, 2022 showed unchanged soft tissue thickening involving the low rectum and anus, no evidence of recurrent disease.  -01/28/2023 - restaging CT CAP - Unchanged circumferential wall thickening of the low rectum and anus. No evidence of lymphadenopathy or metastatic disease in the chest, abdomen, or pelvis. -02/21/2023 - benign exam. Patient scheduled to see Dr. Ladena, colorectal surgeon, tomorrow. Will see her back with labs in 3 months. Will get CT CAP or PET in 6 months. This was ordered today and should be scheduled for 07/2023.  -visit to surgeon in April 2025. No evidence of anal cancer recurrence. He will see her back in 10/2023.  - PET/CT done 07/08/2023 showed no evidence of recurrence or metastatic disease. - Plan for subsequent surveillance CT CAP in 6 months. - Labs and follow-up in 3 months, sooner if needed.   Thyroid  nodule There is 6 mm thyroid  nodule, slightly hypermetabolic, in the right lobe of the thyroid .  No palpable nodules or masses noted on today's exam.  No tenderness in the area of thyroid .  Will continue to  monitor.  Will obtain ultrasound possible biopsy of thyroid  nodule if becomes clinically warranted.  Osteoarthritis Patient having severe pain in her right shoulder, low back, and both hips.  No bony lesions noted on PET/CT.  She is seeing orthopedic provider for further evaluation and management.  Decreased appetite The patient reports having no appetite for several months.  States she has lost a lot of weight.  Was taken off Megace  when hospitalized for her stroke.  Feels like she needs to have something different to help stimulate her appetite.  Will do trial mirtazapine  7.5 mg every evening.  Advised that should be taken in the evening as it might cause her to feel sleepy.  Advised her to contact office if it does not seem to be helping after first week or 2.  May need to increase dosing.  She voiced agreement and understanding.   Plan Reviewed labs. -CBC unremarkable. -CMP showing improved renal functions.  Recommend increased p.o. hydration. -CEA slightly elevated at 5.07. Continue regular visits with surgeon, Dr. Ladena. surveillance CT CAP  in 6 months (12/2023 or 01/2024) follow up with labs in 3 months and sooner if needed  The patient understands the plans discussed today and is in agreement with them.  She knows to contact our office if she develops concerns prior to her next appointment.  I provided 30 minutes of face-to-face time during this encounter and > 50% was spent counseling as documented under my assessment and plan.    Powell FORBES Lessen, NP  [facility] - A DEPT OF [place] [address] [place] [address] Dept: [phone] Dept Fax: [phone]   No orders of the defined types were placed in this encounter.     CHIEF COMPLAINT:  CC: Anal cancer  Current Treatment: Surveillance  INTERVAL HISTORY:  Lorraine Gilbert is here today for repeat clinical assessment.  She was last seen by me on  05/23/2023.  She had a surveillance PET/CT done on 07/08/2023.  This showed no evidence of recurrence or metastatic disease.  There is a small thyroid  nodule noted.  Patient reports having significantly decreased appetite.  She feels like she is losing a great deal of weight.  She does have mild and intermittent rectal pain.  Did see her surgeon who gave her a cream to help with hemorrhoidal type pain.  She denies chest pain, chest pressure, or shortness of breath. She denies headaches or visual disturbances. She denies abdominal pain, nausea, vomiting, or changes in bowel or bladder habits.  She denies fevers or chills. She denies pain. Her weight has been stable.  I have reviewed the past medical history, past surgical history, social history and family history with the patient and they are unchanged from previous note.  ALLERGIES:  is allergic to ibuprofen.  MEDICATIONS:  Current Outpatient Medications  Medication Sig Dispense Refill   ACCU-CHEK AVIVA PLUS test strip 1 each as directed.     Accu-Chek Softclix Lancets lancets 1 each by Other route as directed.     ACETAMINOPHEN  8 HOUR PO Take 1 tablet by mouth as needed.     albuterol (PROVENTIL) (2.5 MG/3ML) 0.083% nebulizer solution SMARTSIG:1 Vial(s) Via Nebulizer Every 4-6 Hours PRN     Alcohol Swabs (ALCOHOL WIPES) 70 % PADS DropSafe Alcohol Prep Pads     amiodarone  (PACERONE ) 100 MG tablet Take 100 mg by mouth daily.     Blood Glucose Calibration (ACCU-CHEK AVIVA) SOLN      Blood Glucose Monitoring Suppl (TRUE METRIX AIR GLUCOSE METER) w/Device KIT See admin instructions.     ELIQUIS  2.5 MG TABS tablet Take 2.5 mg by mouth 2 (two) times daily.     levalbuterol  (XOPENEX ) 1.25 MG/3ML nebulizer solution Inhale into the lungs.     levETIRAcetam  (KEPPRA ) 500 MG tablet Take 1 tablet (500 mg total) by mouth 2 (two) times daily. 180 tablet 3   megestrol  (MEGACE ) 400 MG/10ML suspension Take 10 mLs (400 mg total) by mouth daily. 240 mL 3    mirtazapine  (REMERON ) 7.5 MG tablet Take 1 tablet (7.5 mg total) by mouth at bedtime. 30 tablet 2   nitroGLYCERIN (NITROSTAT) 0.4 MG SL tablet Place 0.4 mg under the tongue every 5 (five) minutes as needed for chest pain.     polyethylene glycol powder (GLYCOLAX /MIRALAX ) 17 GM/SCOOP powder Take by mouth.     TRELEGY ELLIPTA 200-62.5-25 MCG/ACT AEPB Take 1 puff by mouth daily.     No current facility-administered medications for this visit.    HISTORY OF PRESENT ILLNESS:   Oncology History Overview Note   Cancer Staging  Anal cancer [facility]) Staging form: Anus, AJCC V9 - Clinical stage from 01/30/2021: Stage I (cT1, cN0, cM0) - Signed by Lanny Callander, MD on 03/11/2021 Stage prefix: Initial diagnosis Histologic grade (G): G2 Histologic grading system: 4 grade system     Metastatic squamous cell carcinoma involving lymph node with unknown primary site (HCC)  02/06/2018 Imaging   Impression CT abdomen and pelvis 1.  Enlarged left groin lymph node which is of uncertain etiology. This would be amenable to percutaneous sampling if  deemed clinically appropriate. 2.  Hepatic steatosis. Focal area of low-attenuation in the far lateral left hepatic lobe, not clearly seen on the prior exam. Consider liver ultrasound for further evaluation.  3.  Mild L1 compression deformity which is new.   02/28/2018 Pathology Results   Left groin lymph node, needle core biopsy:       -Metastatic squamous cell carcinoma, see comment  P16 stain is strongly positive, suggestive of cervical or anogenital origin in this location. Immunohistochemical stains for CK5, p63, and pan cytokeratin are positive. ER is weakly positive. CK7 and CK20 are negative.   03/15/2018 Pathology Results   1. Cervix, biopsy, 12 o'clock - BENIGN SQUAMOUS MUCOSA WITH INFLAMMATION. - NO DYSPLASIA OR MALIGNANCY. 2. Endocervix, curettage - BENIGN SQUAMOUS EPITHELIUM.   03/23/2018 PET scan   1. Isolated intensely hypermetabolic enlarged LEFT  inguinal lymph node. No clear primary identified. 2. No abnormal activity associated with the vagina or anorectal tissue. 3. No hypermetabolic lymph nodes in the pelvis. 4. Two adjacent nodules in the RIGHT upper lobe with very low metabolic activity. These are not favored primary malignancies. Recommend follow-up per Fleischner criteria. Non-contrast chest CT at 6-12 months is recommended.    07/05/2018 - 07/18/2018 Radiation Therapy   Left inguinal area/nodes treated to 30 Gy in 10 fractions   09/22/2020 Imaging   EXAM: CT ABDOMEN PELVIS W IV CONTRAST   IMPRESSION:  1. Multicystic enhancing right adnexal lesion with mild surrounding stranding. Given short interval since recent CT 3 weeks prior, this is more suspicious for acute findings such as torsion or infection, less likely neoplasm. Recommend endovaginal ultrasound for further evaluation and follow-up to resolution.  2. Other stable chronic findings.    01/30/2021 Pathology Results   DIAGNOSIS   SMALL  TUFT OF TISSUE ABOVE ANAL VERGE, ENDOSCOPIC BIOPSIES:   - HIGH-GRADE SQUAMOUS INTRAEPITHELIAL LESION/ANAL INTRAEPITHELIAL NEOPLASIA 2-3 OUT OF 3 (HGSIL/AIN 2-3).   - SEE COMMENT.  ([id number])   COMMENT  MORPHOLOGICALLY THERE IS AT LEAST TWO THIRDS THICKNESS INVOLVEMENT BY DYSPLASTIC EPITHELIUM WITH MID-LEVEL MITOSES SECONDARY HOWEVER DUE TO POOR ORIENTATION AND MORPHOLOGIC ASSESSMENT IS 2 SERVICE MUCOSA IS ONLY PRESENT IN FOCAL AREAS MORPHOLOGICALLY THIS REPRESENTS AT LEAST AIN 2 AND MOST LIKELY AIN 3.  IN EITHER RESPECT THE FINDINGS ARE CONSISTENT WITH HIGH-GRADE SQUAMOUS INTRAEPITHELIAL LESION WHICH IS ALSO CONFIRMED BY STRONGLY BLOCK LIKE P16 IMMUNOHISTOCHEMICAL  POSITIVITY.    03/05/2021 Pathology Results   Final Diagnosis    Rectal mass, biopsy: -Invasive squamous cell carcinoma, moderately differentiated   Addendum 1    The carcinoma was analyzed by IHC for DNA mismatch repair proteins.  The neoplasm retained nuclear expression of  all four mismatch repair proteins, MLH1, PMS2, MSH2, MSH6.  Controls worked appropriately.     Anal cancer (HCC)  01/30/2021 Cancer Staging   Staging form: Anus, AJCC V9 - Clinical stage from 01/30/2021: Stage I (cT1, cN0, cM0) - Signed by Lanny Callander, MD on 03/11/2021 Stage prefix: Initial diagnosis Histologic grade (G): G2 Histologic grading system: 4 grade system   03/11/2021 Initial Diagnosis   Anal cancer (HCC)   03/30/2021 - 05/01/2021 Chemotherapy   Patient is on Treatment Plan : ANUS Mitomycin  D1,28 / 5FU D1-4, 28-31 q32d     03/15/2022 Imaging    IMPRESSION: 1. Unchanged soft tissue thickening involving the low rectum and anus, in keeping with patient's known anal malignancy. 2. No evidence of lymphadenopathy or metastatic disease in the chest, abdomen, or pelvis. 3. Mild pulmonary fibrosis in a  pattern with apical to basal gradient, featuring irregular peripheral interstitial opacity, septal thickening, traction bronchiectasis, and small areas of subpleural bronchiolectasis at the lung bases without clear evidence of honeycombing. Findings are consistent with probable UIP pattern fibrosis by ATS pulmonary fibrosis criteria.     01/28/2023 Imaging   CT chest abdomen and pelvis with contrast  IMPRESSION: 1. Unchanged circumferential wall thickening of the low rectum and anus, in keeping with anorectal malignancy. 2. No evidence of lymphadenopathy or metastatic disease in the chest, abdomen, or pelvis. 3. Unchanged mild bibasilar predominant pulmonary fibrosis featuring irregular peripheral interstitial opacity and septal thickening as well as traction bronchiectasis and subpleural bronchiolectasis. Findings remain consistent with probable UIP pattern fibrosis. 4. Cardiomegaly and coronary artery disease.   Aortic Atherosclerosis (ICD10-I70.0).       07/08/2023 PET scan   IMPRESSION: 1. No evidence of recurrent or metastatic disease. 2. Focal hypermetabolism in the right  thyroid , corresponding to a 6 mm nodule on 01/28/2023. Typically, thyroid  ultrasound and biopsy would be recommended. However, in a patient of this age and with comorbidities, workup is at the discretion of the referring provider. 3. Aortic atherosclerosis (ICD10-I70.0). Coronary artery calcification.         REVIEW OF SYSTEMS:   Constitutional: Denies fevers, chills or abnormal weight loss.  Moderately reduced appetite. Eyes: Denies blurriness of vision Ears, nose, mouth, throat, and face: Denies mucositis or sore throat Respiratory: Denies cough, dyspnea or wheezes Cardiovascular: Denies palpitation, chest discomfort or lower extremity swelling Gastrointestinal:  Denies nausea, heartburn or change in bowel habits.  Denies abdominal pain.  Has some intermittent rectal pain.  Her surgeon, this is related to hemorrhoid. Skin: Denies abnormal skin rashes Lymphatics: Denies new lymphadenopathy or easy bruising Neurological:Denies numbness, tingling or new weaknesses Behavioral/Psych: Mood is stable, no new changes  All other systems were reviewed with the patient and are negative.   VITALS:   Today's Vitals   07/20/23 1102 07/20/23 1103  BP: (!) 152/43 (!) 154/43  Pulse: 61   Resp: 16   Temp: 97.6 F (36.4 C)   TempSrc: Temporal   SpO2: 100%   Weight: 125 lb 8 oz (56.9 kg)   Height: 5' 3 (1.6 m)    Body mass index is 22.23 kg/m.   Wt Readings from Last 3 Encounters:  07/20/23 125 lb 8 oz (56.9 kg)  07/08/23 131 lb (59.4 kg)  05/23/23 125 lb (56.7 kg)    Body mass index is 22.23 kg/m.  Performance status (ECOG): 1 - Symptomatic but completely ambulatory  PHYSICAL EXAM:   GENERAL:alert, no distress and comfortable SKIN: skin color, texture, turgor are normal, no rashes or significant lesions EYES: normal, Conjunctiva are pink and non-injected, sclera clear OROPHARYNX:no exudate, no erythema and lips, buccal mucosa, and tongue normal  NECK: supple, thyroid  normal  size, non-tender, without nodularity LYMPH:  no palpable lymphadenopathy in the cervical, axillary or inguinal LUNGS: clear to auscultation and percussion with normal breathing effort HEART: regular rate & rhythm and no murmurs and no lower extremity edema ABDOMEN:abdomen soft, non-tender and normal bowel sounds Musculoskeletal:no cyanosis of digits and no clubbing  NEURO: alert & oriented x 3 with fluent speech, no focal motor/sensory deficits  LABORATORY DATA:  I have reviewed the data as listed    Component Value Date/Time   NA 143 07/20/2023 0955   K 4.8 07/20/2023 0955   CL 107 07/20/2023 0955   CO2 27 07/20/2023 0955   GLUCOSE 134 (H) 07/20/2023 0955  BUN 18 07/20/2023 0955   CREATININE 1.04 (H) 07/20/2023 0955   CALCIUM 9.9 07/20/2023 0955   PROT 8.0 07/20/2023 0955   ALBUMIN 4.2 07/20/2023 0955   AST 30 07/20/2023 0955   ALT 45 (H) 07/20/2023 0955   ALKPHOS 87 07/20/2023 0955   BILITOT 0.4 07/20/2023 0955   GFRNONAA 51 (L) 07/20/2023 0955   GFRAA >60 07/28/2017 1340     Lab Results  Component Value Date   WBC 5.7 07/20/2023   NEUTROABS 3.7 07/20/2023   HGB 14.0 07/20/2023   HCT 43.4 07/20/2023   MCV 96.7 07/20/2023   PLT 185 07/20/2023     RADIOGRAPHIC STUDIES: NM PET Image Restag (PS) Skull Base To Thigh Result Date: 07/16/2023 CLINICAL DATA:  Subsequent treatment strategy for colon cancer. EXAM: NUCLEAR MEDICINE PET SKULL BASE TO THIGH TECHNIQUE: 6.6 mCi F-18 FDG was injected intravenously. Full-ring PET imaging was performed from the skull base to thigh after the radiotracer. CT data was obtained and used for attenuation correction and anatomic localization. Fasting blood glucose: 93 mg/dl COMPARISON:  CT chest abdomen pelvis 01/28/2023 and PET 08/14/2021. FINDINGS: Mediastinal blood pool activity: SUV max 3.2 Liver activity: SUV max NA NECK: No abnormal hypermetabolism. Incidental CT findings: None. CHEST: Focal hypermetabolism in the right thyroid , SUV max  5.2. A 6 mm low-attenuation nodule is seen on 01/28/2023. Otherwise, no abnormal hypermetabolism. Incidental CT findings: Atherosclerotic calcification of the aorta, aortic valve and coronary arteries. Heart is enlarged. No pericardial or pleural effusion. ABDOMEN/PELVIS: Focal hypermetabolism within a short segment of small bowel in the lateral right pelvis without a CT correlate. Otherwise, no abnormal hypermetabolism. Incidental CT findings: Low-attenuation lesion in the right kidney. No specific follow-up necessary. SKELETON: No abnormal hypermetabolism. Incidental CT findings: Degenerative changes in the spine.  Right shoulder arthroplasty. IMPRESSION: 1. No evidence of recurrent or metastatic disease. 2. Focal hypermetabolism in the right thyroid , corresponding to a 6 mm nodule on 01/28/2023. Typically, thyroid  ultrasound and biopsy would be recommended. However, in a patient of this age and with comorbidities, workup is at the discretion of the referring provider. 3. Aortic atherosclerosis (ICD10-I70.0). Coronary artery calcification. Electronically Signed   By: Newell Eke M.D.   On: 07/16/2023 13:58

## 2023-07-19 NOTE — Assessment & Plan Note (Addendum)
 Metastatic squamous cell carcinoma involving lymph node with unknown primary site [facility]) -cT1N0M0  -she had metastatic squamous cell carcinoma to left groin lymph nodes in February 2020, PET scan was negative for primary tumor, she underwent definitive radiation. -She was found to have anal cancer in 02/2021 -Status post concurrent chemoradiation, completed in April 2023. -Posttreatment PET scan in July 2023 showed treatment response, no evidence of metastasis. -Surveillance CT scan from March 15, 2022 showed unchanged soft tissue thickening involving the low rectum and anus, no evidence of recurrent disease.  -01/28/2023 - restaging CT CAP - Unchanged circumferential wall thickening of the low rectum and anus. No evidence of lymphadenopathy or metastatic disease in the chest, abdomen, or pelvis. -02/21/2023 - benign exam. Patient scheduled to see Dr. Ladena, colorectal surgeon, tomorrow. Will see her back with labs in 3 months. Will get CT CAP or PET in 6 months. This was ordered today and should be scheduled for 07/2023.  -visit to surgeon in April 2025. No evidence of anal cancer recurrence. He will see her back in 10/2023.  - PET/CT done 07/08/2023 showed no evidence of recurrence or metastatic disease. - Plan for subsequent surveillance CT CAP in 6 months. - Labs and follow-up in 3 months, sooner if needed.

## 2023-07-20 ENCOUNTER — Inpatient Hospital Stay: Attending: Nurse Practitioner

## 2023-07-20 ENCOUNTER — Inpatient Hospital Stay: Admitting: Nurse Practitioner

## 2023-07-20 VITALS — BP 154/43 | HR 61 | Temp 97.6°F | Resp 16 | Ht 63.0 in | Wt 125.5 lb

## 2023-07-20 DIAGNOSIS — C210 Malignant neoplasm of anus, unspecified: Secondary | ICD-10-CM

## 2023-07-20 DIAGNOSIS — Z85048 Personal history of other malignant neoplasm of rectum, rectosigmoid junction, and anus: Secondary | ICD-10-CM | POA: Diagnosis not present

## 2023-07-20 LAB — CBC WITH DIFFERENTIAL (CANCER CENTER ONLY)
Abs Immature Granulocytes: 0.09 10*3/uL — ABNORMAL HIGH (ref 0.00–0.07)
Basophils Absolute: 0 10*3/uL (ref 0.0–0.1)
Basophils Relative: 0 %
Eosinophils Absolute: 0.1 10*3/uL (ref 0.0–0.5)
Eosinophils Relative: 2 %
HCT: 43.4 % (ref 36.0–46.0)
Hemoglobin: 14 g/dL (ref 12.0–15.0)
Immature Granulocytes: 2 %
Lymphocytes Relative: 22 %
Lymphs Abs: 1.3 10*3/uL (ref 0.7–4.0)
MCH: 31.2 pg (ref 26.0–34.0)
MCHC: 32.3 g/dL (ref 30.0–36.0)
MCV: 96.7 fL (ref 80.0–100.0)
Monocytes Absolute: 0.6 10*3/uL (ref 0.1–1.0)
Monocytes Relative: 10 %
Neutro Abs: 3.7 10*3/uL (ref 1.7–7.7)
Neutrophils Relative %: 64 %
Platelet Count: 185 10*3/uL (ref 150–400)
RBC: 4.49 MIL/uL (ref 3.87–5.11)
RDW: 13.8 % (ref 11.5–15.5)
WBC Count: 5.7 10*3/uL (ref 4.0–10.5)
nRBC: 0 % (ref 0.0–0.2)

## 2023-07-20 LAB — CMP (CANCER CENTER ONLY)
ALT: 45 U/L — ABNORMAL HIGH (ref 0–44)
AST: 30 U/L (ref 15–41)
Albumin: 4.2 g/dL (ref 3.5–5.0)
Alkaline Phosphatase: 87 U/L (ref 38–126)
Anion gap: 9 (ref 5–15)
BUN: 18 mg/dL (ref 8–23)
CO2: 27 mmol/L (ref 22–32)
Calcium: 9.9 mg/dL (ref 8.9–10.3)
Chloride: 107 mmol/L (ref 98–111)
Creatinine: 1.04 mg/dL — ABNORMAL HIGH (ref 0.44–1.00)
GFR, Estimated: 51 mL/min — ABNORMAL LOW (ref >60)
Glucose, Bld: 134 mg/dL — ABNORMAL HIGH (ref 70–99)
Potassium: 4.8 mmol/L (ref 3.5–5.1)
Sodium: 143 mmol/L (ref 135–145)
Total Bilirubin: 0.4 mg/dL (ref 0.0–1.2)
Total Protein: 8 g/dL (ref 6.5–8.1)

## 2023-07-20 LAB — CEA (ACCESS): CEA (CHCC): 5.07 ng/mL — ABNORMAL HIGH (ref 0.00–5.00)

## 2023-07-20 MED ORDER — MIRTAZAPINE 7.5 MG PO TABS: 7.5000 mg | ORAL_TABLET | Freq: Every day | ORAL | 2 refills | Status: DC

## 2023-07-21 ENCOUNTER — Other Ambulatory Visit: Payer: Self-pay

## 2023-07-21 ENCOUNTER — Other Ambulatory Visit: Payer: Self-pay | Admitting: Nurse Practitioner

## 2023-07-21 ENCOUNTER — Telehealth: Payer: Self-pay

## 2023-07-21 DIAGNOSIS — E1151 Type 2 diabetes mellitus with diabetic peripheral angiopathy without gangrene: Secondary | ICD-10-CM | POA: Diagnosis not present

## 2023-07-21 DIAGNOSIS — I509 Heart failure, unspecified: Secondary | ICD-10-CM | POA: Diagnosis not present

## 2023-07-21 DIAGNOSIS — I110 Hypertensive heart disease with heart failure: Secondary | ICD-10-CM | POA: Diagnosis not present

## 2023-07-21 DIAGNOSIS — J4489 Other specified chronic obstructive pulmonary disease: Secondary | ICD-10-CM | POA: Diagnosis not present

## 2023-07-21 DIAGNOSIS — I739 Peripheral vascular disease, unspecified: Secondary | ICD-10-CM | POA: Diagnosis not present

## 2023-07-21 DIAGNOSIS — Z7901 Long term (current) use of anticoagulants: Secondary | ICD-10-CM | POA: Diagnosis not present

## 2023-07-21 DIAGNOSIS — I69354 Hemiplegia and hemiparesis following cerebral infarction affecting left non-dominant side: Secondary | ICD-10-CM | POA: Diagnosis not present

## 2023-07-21 DIAGNOSIS — Z9181 History of falling: Secondary | ICD-10-CM | POA: Diagnosis not present

## 2023-07-21 DIAGNOSIS — J45909 Unspecified asthma, uncomplicated: Secondary | ICD-10-CM | POA: Diagnosis not present

## 2023-07-21 DIAGNOSIS — I69322 Dysarthria following cerebral infarction: Secondary | ICD-10-CM | POA: Diagnosis not present

## 2023-07-21 DIAGNOSIS — I69391 Dysphagia following cerebral infarction: Secondary | ICD-10-CM | POA: Diagnosis not present

## 2023-07-21 DIAGNOSIS — K5900 Constipation, unspecified: Secondary | ICD-10-CM | POA: Diagnosis not present

## 2023-07-21 DIAGNOSIS — R1312 Dysphagia, oropharyngeal phase: Secondary | ICD-10-CM | POA: Diagnosis not present

## 2023-07-21 DIAGNOSIS — I2510 Atherosclerotic heart disease of native coronary artery without angina pectoris: Secondary | ICD-10-CM | POA: Diagnosis not present

## 2023-07-21 DIAGNOSIS — I4891 Unspecified atrial fibrillation: Secondary | ICD-10-CM | POA: Diagnosis not present

## 2023-07-21 MED ORDER — MIRTAZAPINE 7.5 MG PO TABS: 7.5000 mg | ORAL_TABLET | Freq: Every day | ORAL | 2 refills | Status: DC

## 2023-07-21 NOTE — Telephone Encounter (Signed)
 Pt called stating she was seen yesterday in clinic by Powell Lessen, NP and they discussed a new prescription to help with pt's appetite.  Pt stated she checked with her pharmacy and they did not receive a new prescription for a medication to help increase the pt's appetite.  Pt stated she's not eating much at all and Powell was to prescribe a new medication other than Megestrol .  Stated this nurse will make Powell aware of the pt's call.

## 2023-07-24 ENCOUNTER — Encounter: Payer: Self-pay | Admitting: Nurse Practitioner

## 2023-07-24 ENCOUNTER — Encounter: Payer: Self-pay | Admitting: Hematology

## 2023-07-25 ENCOUNTER — Telehealth: Payer: Self-pay

## 2023-07-25 NOTE — Telephone Encounter (Signed)
 Scheduled appointments per 7/2 los. Called and left a VM with appointment details for the patient.

## 2023-07-26 ENCOUNTER — Telehealth: Payer: Self-pay

## 2023-07-26 ENCOUNTER — Other Ambulatory Visit: Payer: Self-pay

## 2023-07-26 DIAGNOSIS — Z7409 Other reduced mobility: Secondary | ICD-10-CM | POA: Diagnosis not present

## 2023-07-26 DIAGNOSIS — R569 Unspecified convulsions: Secondary | ICD-10-CM | POA: Diagnosis not present

## 2023-07-26 NOTE — Telephone Encounter (Signed)
 Received telephone call from the patient inquiring Rx for her appetite. Patient stated she was seen in the office 07/20/23. Patient stated she called 07/21/23, and has not heard back. Let patient know I would forward her message to the provider and follow-up w/ her.

## 2023-07-26 NOTE — Telephone Encounter (Signed)
 Received telephone call from Switzer @Dr . Swayne's office regarding the Mirtazapine  7.5mg  Rx. Let Lorraine Gilbert know that Lorraine Lessen, NP seen this patient 07/20/23 and sent in Rx for Mirtazapine  7.5mg . Let Lorraine Gilbert know that [facility] would not fill Rx because their records indicate Dr. Seabron sent in Rx on 06/24/23 and their system is showing the 90-day supply was picked up by the patient. Let Lorraine Gilbert know, patient states she did not pickup this medication on 06/24/23. & that patient stated she did pickup the Eliquis  2.5mg  and the Telmisartan 30mg . Let Lorraine Gilbert know that patient was still c/o of a poor appetite. Let Lorraine Gilbert know that Lorraine Lessen, NP was willing to increase the dosage of the Mirtazapine  7.5mg  to 15mg . Lorraine Gilbert voiced understanding. Lorraine Gilbert stated Dr. Seabron would take care of the dosage increase. Lorraine Gilbert confirmed that she would follow-up with the patient as well as [facility] to resolve this matter.

## 2023-07-28 DIAGNOSIS — I2510 Atherosclerotic heart disease of native coronary artery without angina pectoris: Secondary | ICD-10-CM | POA: Diagnosis not present

## 2023-07-28 DIAGNOSIS — I509 Heart failure, unspecified: Secondary | ICD-10-CM | POA: Diagnosis not present

## 2023-07-28 DIAGNOSIS — J45909 Unspecified asthma, uncomplicated: Secondary | ICD-10-CM | POA: Diagnosis not present

## 2023-07-28 DIAGNOSIS — J4489 Other specified chronic obstructive pulmonary disease: Secondary | ICD-10-CM | POA: Diagnosis not present

## 2023-07-28 DIAGNOSIS — I739 Peripheral vascular disease, unspecified: Secondary | ICD-10-CM | POA: Diagnosis not present

## 2023-07-28 DIAGNOSIS — I69354 Hemiplegia and hemiparesis following cerebral infarction affecting left non-dominant side: Secondary | ICD-10-CM | POA: Diagnosis not present

## 2023-07-28 DIAGNOSIS — K5900 Constipation, unspecified: Secondary | ICD-10-CM | POA: Diagnosis not present

## 2023-07-28 DIAGNOSIS — Z9181 History of falling: Secondary | ICD-10-CM | POA: Diagnosis not present

## 2023-07-28 DIAGNOSIS — I4891 Unspecified atrial fibrillation: Secondary | ICD-10-CM | POA: Diagnosis not present

## 2023-07-28 DIAGNOSIS — I25119 Atherosclerotic heart disease of native coronary artery with unspecified angina pectoris: Secondary | ICD-10-CM | POA: Diagnosis not present

## 2023-07-28 DIAGNOSIS — I110 Hypertensive heart disease with heart failure: Secondary | ICD-10-CM | POA: Diagnosis not present

## 2023-07-28 DIAGNOSIS — E1151 Type 2 diabetes mellitus with diabetic peripheral angiopathy without gangrene: Secondary | ICD-10-CM | POA: Diagnosis not present

## 2023-07-28 DIAGNOSIS — Z7901 Long term (current) use of anticoagulants: Secondary | ICD-10-CM | POA: Diagnosis not present

## 2023-07-28 DIAGNOSIS — I69322 Dysarthria following cerebral infarction: Secondary | ICD-10-CM | POA: Diagnosis not present

## 2023-07-28 DIAGNOSIS — R1312 Dysphagia, oropharyngeal phase: Secondary | ICD-10-CM | POA: Diagnosis not present

## 2023-07-28 DIAGNOSIS — I69391 Dysphagia following cerebral infarction: Secondary | ICD-10-CM | POA: Diagnosis not present

## 2023-08-01 ENCOUNTER — Other Ambulatory Visit: Payer: Self-pay | Admitting: Nurse Practitioner

## 2023-08-01 ENCOUNTER — Other Ambulatory Visit: Payer: Self-pay

## 2023-08-01 DIAGNOSIS — I509 Heart failure, unspecified: Secondary | ICD-10-CM | POA: Diagnosis not present

## 2023-08-01 DIAGNOSIS — I69354 Hemiplegia and hemiparesis following cerebral infarction affecting left non-dominant side: Secondary | ICD-10-CM | POA: Diagnosis not present

## 2023-08-01 DIAGNOSIS — E1151 Type 2 diabetes mellitus with diabetic peripheral angiopathy without gangrene: Secondary | ICD-10-CM | POA: Diagnosis not present

## 2023-08-01 DIAGNOSIS — Z7901 Long term (current) use of anticoagulants: Secondary | ICD-10-CM | POA: Diagnosis not present

## 2023-08-01 DIAGNOSIS — J45909 Unspecified asthma, uncomplicated: Secondary | ICD-10-CM | POA: Diagnosis not present

## 2023-08-01 DIAGNOSIS — R1312 Dysphagia, oropharyngeal phase: Secondary | ICD-10-CM | POA: Diagnosis not present

## 2023-08-01 DIAGNOSIS — I2510 Atherosclerotic heart disease of native coronary artery without angina pectoris: Secondary | ICD-10-CM | POA: Diagnosis not present

## 2023-08-01 DIAGNOSIS — K5900 Constipation, unspecified: Secondary | ICD-10-CM | POA: Diagnosis not present

## 2023-08-01 DIAGNOSIS — I4891 Unspecified atrial fibrillation: Secondary | ICD-10-CM | POA: Diagnosis not present

## 2023-08-01 DIAGNOSIS — J4489 Other specified chronic obstructive pulmonary disease: Secondary | ICD-10-CM | POA: Diagnosis not present

## 2023-08-01 DIAGNOSIS — I110 Hypertensive heart disease with heart failure: Secondary | ICD-10-CM | POA: Diagnosis not present

## 2023-08-01 DIAGNOSIS — I739 Peripheral vascular disease, unspecified: Secondary | ICD-10-CM | POA: Diagnosis not present

## 2023-08-01 DIAGNOSIS — I69391 Dysphagia following cerebral infarction: Secondary | ICD-10-CM | POA: Diagnosis not present

## 2023-08-01 DIAGNOSIS — Z9181 History of falling: Secondary | ICD-10-CM | POA: Diagnosis not present

## 2023-08-01 DIAGNOSIS — I69322 Dysarthria following cerebral infarction: Secondary | ICD-10-CM | POA: Diagnosis not present

## 2023-08-01 MED ORDER — MIRTAZAPINE 7.5 MG PO TABS: 7.1500 mg | ORAL_TABLET | Freq: Every day | ORAL | 2 refills | Status: DC

## 2023-08-02 DIAGNOSIS — K5900 Constipation, unspecified: Secondary | ICD-10-CM | POA: Diagnosis not present

## 2023-08-02 DIAGNOSIS — I69354 Hemiplegia and hemiparesis following cerebral infarction affecting left non-dominant side: Secondary | ICD-10-CM | POA: Diagnosis not present

## 2023-08-02 DIAGNOSIS — J45909 Unspecified asthma, uncomplicated: Secondary | ICD-10-CM | POA: Diagnosis not present

## 2023-08-02 DIAGNOSIS — I739 Peripheral vascular disease, unspecified: Secondary | ICD-10-CM | POA: Diagnosis not present

## 2023-08-02 DIAGNOSIS — I110 Hypertensive heart disease with heart failure: Secondary | ICD-10-CM | POA: Diagnosis not present

## 2023-08-02 DIAGNOSIS — I2510 Atherosclerotic heart disease of native coronary artery without angina pectoris: Secondary | ICD-10-CM | POA: Diagnosis not present

## 2023-08-02 DIAGNOSIS — I509 Heart failure, unspecified: Secondary | ICD-10-CM | POA: Diagnosis not present

## 2023-08-02 DIAGNOSIS — Z7901 Long term (current) use of anticoagulants: Secondary | ICD-10-CM | POA: Diagnosis not present

## 2023-08-02 DIAGNOSIS — E1151 Type 2 diabetes mellitus with diabetic peripheral angiopathy without gangrene: Secondary | ICD-10-CM | POA: Diagnosis not present

## 2023-08-02 DIAGNOSIS — I4891 Unspecified atrial fibrillation: Secondary | ICD-10-CM | POA: Diagnosis not present

## 2023-08-02 DIAGNOSIS — I69391 Dysphagia following cerebral infarction: Secondary | ICD-10-CM | POA: Diagnosis not present

## 2023-08-02 DIAGNOSIS — I69322 Dysarthria following cerebral infarction: Secondary | ICD-10-CM | POA: Diagnosis not present

## 2023-08-02 DIAGNOSIS — J4489 Other specified chronic obstructive pulmonary disease: Secondary | ICD-10-CM | POA: Diagnosis not present

## 2023-08-02 DIAGNOSIS — R1312 Dysphagia, oropharyngeal phase: Secondary | ICD-10-CM | POA: Diagnosis not present

## 2023-08-02 DIAGNOSIS — Z9181 History of falling: Secondary | ICD-10-CM | POA: Diagnosis not present

## 2023-08-03 DIAGNOSIS — M7918 Myalgia, other site: Secondary | ICD-10-CM | POA: Diagnosis not present

## 2023-08-04 ENCOUNTER — Other Ambulatory Visit: Payer: Self-pay | Admitting: Nurse Practitioner

## 2023-08-04 MED ORDER — MIRTAZAPINE 15 MG PO TABS: 15.0000 mg | ORAL_TABLET | Freq: Every day | ORAL | 1 refills | Status: DC

## 2023-08-08 DIAGNOSIS — I739 Peripheral vascular disease, unspecified: Secondary | ICD-10-CM | POA: Diagnosis not present

## 2023-08-08 DIAGNOSIS — R1312 Dysphagia, oropharyngeal phase: Secondary | ICD-10-CM | POA: Diagnosis not present

## 2023-08-08 DIAGNOSIS — Z9181 History of falling: Secondary | ICD-10-CM | POA: Diagnosis not present

## 2023-08-08 DIAGNOSIS — I4891 Unspecified atrial fibrillation: Secondary | ICD-10-CM | POA: Diagnosis not present

## 2023-08-08 DIAGNOSIS — E1151 Type 2 diabetes mellitus with diabetic peripheral angiopathy without gangrene: Secondary | ICD-10-CM | POA: Diagnosis not present

## 2023-08-08 DIAGNOSIS — I69354 Hemiplegia and hemiparesis following cerebral infarction affecting left non-dominant side: Secondary | ICD-10-CM | POA: Diagnosis not present

## 2023-08-08 DIAGNOSIS — I69391 Dysphagia following cerebral infarction: Secondary | ICD-10-CM | POA: Diagnosis not present

## 2023-08-08 DIAGNOSIS — J4489 Other specified chronic obstructive pulmonary disease: Secondary | ICD-10-CM | POA: Diagnosis not present

## 2023-08-08 DIAGNOSIS — I110 Hypertensive heart disease with heart failure: Secondary | ICD-10-CM | POA: Diagnosis not present

## 2023-08-08 DIAGNOSIS — K5900 Constipation, unspecified: Secondary | ICD-10-CM | POA: Diagnosis not present

## 2023-08-08 DIAGNOSIS — Z7901 Long term (current) use of anticoagulants: Secondary | ICD-10-CM | POA: Diagnosis not present

## 2023-08-08 DIAGNOSIS — J45909 Unspecified asthma, uncomplicated: Secondary | ICD-10-CM | POA: Diagnosis not present

## 2023-08-08 DIAGNOSIS — I69322 Dysarthria following cerebral infarction: Secondary | ICD-10-CM | POA: Diagnosis not present

## 2023-08-08 DIAGNOSIS — I2510 Atherosclerotic heart disease of native coronary artery without angina pectoris: Secondary | ICD-10-CM | POA: Diagnosis not present

## 2023-08-08 DIAGNOSIS — I509 Heart failure, unspecified: Secondary | ICD-10-CM | POA: Diagnosis not present

## 2023-08-16 DIAGNOSIS — I2510 Atherosclerotic heart disease of native coronary artery without angina pectoris: Secondary | ICD-10-CM | POA: Diagnosis not present

## 2023-08-16 DIAGNOSIS — R1312 Dysphagia, oropharyngeal phase: Secondary | ICD-10-CM | POA: Diagnosis not present

## 2023-08-16 DIAGNOSIS — I69391 Dysphagia following cerebral infarction: Secondary | ICD-10-CM | POA: Diagnosis not present

## 2023-08-16 DIAGNOSIS — I69354 Hemiplegia and hemiparesis following cerebral infarction affecting left non-dominant side: Secondary | ICD-10-CM | POA: Diagnosis not present

## 2023-08-16 DIAGNOSIS — I110 Hypertensive heart disease with heart failure: Secondary | ICD-10-CM | POA: Diagnosis not present

## 2023-08-16 DIAGNOSIS — Z9181 History of falling: Secondary | ICD-10-CM | POA: Diagnosis not present

## 2023-08-16 DIAGNOSIS — Z7901 Long term (current) use of anticoagulants: Secondary | ICD-10-CM | POA: Diagnosis not present

## 2023-08-16 DIAGNOSIS — I69322 Dysarthria following cerebral infarction: Secondary | ICD-10-CM | POA: Diagnosis not present

## 2023-08-16 DIAGNOSIS — I739 Peripheral vascular disease, unspecified: Secondary | ICD-10-CM | POA: Diagnosis not present

## 2023-08-16 DIAGNOSIS — E1151 Type 2 diabetes mellitus with diabetic peripheral angiopathy without gangrene: Secondary | ICD-10-CM | POA: Diagnosis not present

## 2023-08-16 DIAGNOSIS — K5900 Constipation, unspecified: Secondary | ICD-10-CM | POA: Diagnosis not present

## 2023-08-16 DIAGNOSIS — I509 Heart failure, unspecified: Secondary | ICD-10-CM | POA: Diagnosis not present

## 2023-08-16 DIAGNOSIS — J45909 Unspecified asthma, uncomplicated: Secondary | ICD-10-CM | POA: Diagnosis not present

## 2023-08-16 DIAGNOSIS — J4489 Other specified chronic obstructive pulmonary disease: Secondary | ICD-10-CM | POA: Diagnosis not present

## 2023-08-16 DIAGNOSIS — I4891 Unspecified atrial fibrillation: Secondary | ICD-10-CM | POA: Diagnosis not present

## 2023-08-18 DIAGNOSIS — Z85048 Personal history of other malignant neoplasm of rectum, rectosigmoid junction, and anus: Secondary | ICD-10-CM | POA: Diagnosis not present

## 2023-08-18 DIAGNOSIS — E7800 Pure hypercholesterolemia, unspecified: Secondary | ICD-10-CM | POA: Diagnosis not present

## 2023-08-18 DIAGNOSIS — I25119 Atherosclerotic heart disease of native coronary artery with unspecified angina pectoris: Secondary | ICD-10-CM | POA: Diagnosis not present

## 2023-08-18 DIAGNOSIS — I4891 Unspecified atrial fibrillation: Secondary | ICD-10-CM | POA: Diagnosis not present

## 2023-08-18 DIAGNOSIS — E1151 Type 2 diabetes mellitus with diabetic peripheral angiopathy without gangrene: Secondary | ICD-10-CM | POA: Diagnosis not present

## 2023-08-18 DIAGNOSIS — I739 Peripheral vascular disease, unspecified: Secondary | ICD-10-CM | POA: Diagnosis not present

## 2023-08-21 DIAGNOSIS — M4856XA Collapsed vertebra, not elsewhere classified, lumbar region, initial encounter for fracture: Secondary | ICD-10-CM | POA: Diagnosis not present

## 2023-08-22 DIAGNOSIS — J45909 Unspecified asthma, uncomplicated: Secondary | ICD-10-CM | POA: Diagnosis not present

## 2023-08-22 DIAGNOSIS — J4489 Other specified chronic obstructive pulmonary disease: Secondary | ICD-10-CM | POA: Diagnosis not present

## 2023-08-22 DIAGNOSIS — I69322 Dysarthria following cerebral infarction: Secondary | ICD-10-CM | POA: Diagnosis not present

## 2023-08-22 DIAGNOSIS — I4891 Unspecified atrial fibrillation: Secondary | ICD-10-CM | POA: Diagnosis not present

## 2023-08-22 DIAGNOSIS — I69354 Hemiplegia and hemiparesis following cerebral infarction affecting left non-dominant side: Secondary | ICD-10-CM | POA: Diagnosis not present

## 2023-08-22 DIAGNOSIS — E1151 Type 2 diabetes mellitus with diabetic peripheral angiopathy without gangrene: Secondary | ICD-10-CM | POA: Diagnosis not present

## 2023-08-22 DIAGNOSIS — R1312 Dysphagia, oropharyngeal phase: Secondary | ICD-10-CM | POA: Diagnosis not present

## 2023-08-22 DIAGNOSIS — I2510 Atherosclerotic heart disease of native coronary artery without angina pectoris: Secondary | ICD-10-CM | POA: Diagnosis not present

## 2023-08-22 DIAGNOSIS — Z7901 Long term (current) use of anticoagulants: Secondary | ICD-10-CM | POA: Diagnosis not present

## 2023-08-22 DIAGNOSIS — I69391 Dysphagia following cerebral infarction: Secondary | ICD-10-CM | POA: Diagnosis not present

## 2023-08-22 DIAGNOSIS — I739 Peripheral vascular disease, unspecified: Secondary | ICD-10-CM | POA: Diagnosis not present

## 2023-08-22 DIAGNOSIS — Z9181 History of falling: Secondary | ICD-10-CM | POA: Diagnosis not present

## 2023-08-22 DIAGNOSIS — I509 Heart failure, unspecified: Secondary | ICD-10-CM | POA: Diagnosis not present

## 2023-08-22 DIAGNOSIS — K5900 Constipation, unspecified: Secondary | ICD-10-CM | POA: Diagnosis not present

## 2023-08-22 DIAGNOSIS — I110 Hypertensive heart disease with heart failure: Secondary | ICD-10-CM | POA: Diagnosis not present

## 2023-08-23 ENCOUNTER — Inpatient Hospital Stay: Payer: Medicare Other | Admitting: Nurse Practitioner

## 2023-08-23 ENCOUNTER — Inpatient Hospital Stay: Payer: Medicare Other

## 2023-08-26 DIAGNOSIS — R569 Unspecified convulsions: Secondary | ICD-10-CM | POA: Diagnosis not present

## 2023-08-26 DIAGNOSIS — Z7409 Other reduced mobility: Secondary | ICD-10-CM | POA: Diagnosis not present

## 2023-08-27 DIAGNOSIS — I25119 Atherosclerotic heart disease of native coronary artery with unspecified angina pectoris: Secondary | ICD-10-CM | POA: Diagnosis not present

## 2023-08-27 DIAGNOSIS — I739 Peripheral vascular disease, unspecified: Secondary | ICD-10-CM | POA: Diagnosis not present

## 2023-08-27 DIAGNOSIS — I4891 Unspecified atrial fibrillation: Secondary | ICD-10-CM | POA: Diagnosis not present

## 2023-08-27 DIAGNOSIS — E1151 Type 2 diabetes mellitus with diabetic peripheral angiopathy without gangrene: Secondary | ICD-10-CM | POA: Diagnosis not present

## 2023-08-29 DIAGNOSIS — K5900 Constipation, unspecified: Secondary | ICD-10-CM | POA: Diagnosis not present

## 2023-08-29 DIAGNOSIS — J4489 Other specified chronic obstructive pulmonary disease: Secondary | ICD-10-CM | POA: Diagnosis not present

## 2023-08-29 DIAGNOSIS — R1312 Dysphagia, oropharyngeal phase: Secondary | ICD-10-CM | POA: Diagnosis not present

## 2023-08-29 DIAGNOSIS — I69354 Hemiplegia and hemiparesis following cerebral infarction affecting left non-dominant side: Secondary | ICD-10-CM | POA: Diagnosis not present

## 2023-08-29 DIAGNOSIS — Z7901 Long term (current) use of anticoagulants: Secondary | ICD-10-CM | POA: Diagnosis not present

## 2023-08-29 DIAGNOSIS — I69391 Dysphagia following cerebral infarction: Secondary | ICD-10-CM | POA: Diagnosis not present

## 2023-08-29 DIAGNOSIS — I4891 Unspecified atrial fibrillation: Secondary | ICD-10-CM | POA: Diagnosis not present

## 2023-08-29 DIAGNOSIS — I110 Hypertensive heart disease with heart failure: Secondary | ICD-10-CM | POA: Diagnosis not present

## 2023-08-29 DIAGNOSIS — Z9181 History of falling: Secondary | ICD-10-CM | POA: Diagnosis not present

## 2023-08-29 DIAGNOSIS — I739 Peripheral vascular disease, unspecified: Secondary | ICD-10-CM | POA: Diagnosis not present

## 2023-08-29 DIAGNOSIS — J45909 Unspecified asthma, uncomplicated: Secondary | ICD-10-CM | POA: Diagnosis not present

## 2023-08-29 DIAGNOSIS — I2510 Atherosclerotic heart disease of native coronary artery without angina pectoris: Secondary | ICD-10-CM | POA: Diagnosis not present

## 2023-08-29 DIAGNOSIS — E1151 Type 2 diabetes mellitus with diabetic peripheral angiopathy without gangrene: Secondary | ICD-10-CM | POA: Diagnosis not present

## 2023-08-29 DIAGNOSIS — I69322 Dysarthria following cerebral infarction: Secondary | ICD-10-CM | POA: Diagnosis not present

## 2023-08-29 DIAGNOSIS — I509 Heart failure, unspecified: Secondary | ICD-10-CM | POA: Diagnosis not present

## 2023-09-06 DIAGNOSIS — M48062 Spinal stenosis, lumbar region with neurogenic claudication: Secondary | ICD-10-CM | POA: Diagnosis not present

## 2023-09-06 DIAGNOSIS — M79674 Pain in right toe(s): Secondary | ICD-10-CM | POA: Diagnosis not present

## 2023-09-06 DIAGNOSIS — Z7901 Long term (current) use of anticoagulants: Secondary | ICD-10-CM | POA: Diagnosis not present

## 2023-09-06 DIAGNOSIS — M47816 Spondylosis without myelopathy or radiculopathy, lumbar region: Secondary | ICD-10-CM | POA: Diagnosis not present

## 2023-09-06 DIAGNOSIS — M79675 Pain in left toe(s): Secondary | ICD-10-CM | POA: Diagnosis not present

## 2023-09-06 DIAGNOSIS — Z79899 Other long term (current) drug therapy: Secondary | ICD-10-CM | POA: Diagnosis not present

## 2023-09-06 DIAGNOSIS — M5136 Other intervertebral disc degeneration, lumbar region with discogenic back pain only: Secondary | ICD-10-CM | POA: Diagnosis not present

## 2023-09-08 DIAGNOSIS — E559 Vitamin D deficiency, unspecified: Secondary | ICD-10-CM | POA: Diagnosis not present

## 2023-09-08 DIAGNOSIS — I10 Essential (primary) hypertension: Secondary | ICD-10-CM | POA: Diagnosis not present

## 2023-09-08 DIAGNOSIS — I4891 Unspecified atrial fibrillation: Secondary | ICD-10-CM | POA: Diagnosis not present

## 2023-09-08 DIAGNOSIS — L03032 Cellulitis of left toe: Secondary | ICD-10-CM | POA: Diagnosis not present

## 2023-09-08 DIAGNOSIS — I739 Peripheral vascular disease, unspecified: Secondary | ICD-10-CM | POA: Diagnosis not present

## 2023-09-08 DIAGNOSIS — E039 Hypothyroidism, unspecified: Secondary | ICD-10-CM | POA: Diagnosis not present

## 2023-09-08 DIAGNOSIS — G629 Polyneuropathy, unspecified: Secondary | ICD-10-CM | POA: Diagnosis not present

## 2023-09-08 DIAGNOSIS — E7800 Pure hypercholesterolemia, unspecified: Secondary | ICD-10-CM | POA: Diagnosis not present

## 2023-09-08 DIAGNOSIS — E1169 Type 2 diabetes mellitus with other specified complication: Secondary | ICD-10-CM | POA: Diagnosis not present

## 2023-09-08 DIAGNOSIS — M069 Rheumatoid arthritis, unspecified: Secondary | ICD-10-CM | POA: Diagnosis not present

## 2023-09-08 DIAGNOSIS — I25119 Atherosclerotic heart disease of native coronary artery with unspecified angina pectoris: Secondary | ICD-10-CM | POA: Diagnosis not present

## 2023-09-18 DIAGNOSIS — Z85048 Personal history of other malignant neoplasm of rectum, rectosigmoid junction, and anus: Secondary | ICD-10-CM | POA: Diagnosis not present

## 2023-09-18 DIAGNOSIS — E7800 Pure hypercholesterolemia, unspecified: Secondary | ICD-10-CM | POA: Diagnosis not present

## 2023-09-18 DIAGNOSIS — I4891 Unspecified atrial fibrillation: Secondary | ICD-10-CM | POA: Diagnosis not present

## 2023-09-20 DIAGNOSIS — C210 Malignant neoplasm of anus, unspecified: Secondary | ICD-10-CM | POA: Diagnosis not present

## 2023-09-20 DIAGNOSIS — R197 Diarrhea, unspecified: Secondary | ICD-10-CM | POA: Diagnosis not present

## 2023-09-24 DIAGNOSIS — T148XXA Other injury of unspecified body region, initial encounter: Secondary | ICD-10-CM | POA: Diagnosis not present

## 2023-09-24 DIAGNOSIS — R197 Diarrhea, unspecified: Secondary | ICD-10-CM | POA: Diagnosis not present

## 2023-09-26 DIAGNOSIS — I25119 Atherosclerotic heart disease of native coronary artery with unspecified angina pectoris: Secondary | ICD-10-CM | POA: Diagnosis not present

## 2023-09-26 DIAGNOSIS — I739 Peripheral vascular disease, unspecified: Secondary | ICD-10-CM | POA: Diagnosis not present

## 2023-09-26 DIAGNOSIS — R569 Unspecified convulsions: Secondary | ICD-10-CM | POA: Diagnosis not present

## 2023-09-26 DIAGNOSIS — I4891 Unspecified atrial fibrillation: Secondary | ICD-10-CM | POA: Diagnosis not present

## 2023-09-26 DIAGNOSIS — E1151 Type 2 diabetes mellitus with diabetic peripheral angiopathy without gangrene: Secondary | ICD-10-CM | POA: Diagnosis not present

## 2023-09-28 DIAGNOSIS — M47816 Spondylosis without myelopathy or radiculopathy, lumbar region: Secondary | ICD-10-CM | POA: Diagnosis not present

## 2023-09-28 DIAGNOSIS — Z79899 Other long term (current) drug therapy: Secondary | ICD-10-CM | POA: Diagnosis not present

## 2023-10-14 DIAGNOSIS — M47816 Spondylosis without myelopathy or radiculopathy, lumbar region: Secondary | ICD-10-CM | POA: Diagnosis not present

## 2023-10-18 ENCOUNTER — Other Ambulatory Visit: Payer: Self-pay | Admitting: Nurse Practitioner

## 2023-10-18 DIAGNOSIS — Z85048 Personal history of other malignant neoplasm of rectum, rectosigmoid junction, and anus: Secondary | ICD-10-CM | POA: Diagnosis not present

## 2023-10-18 DIAGNOSIS — E1151 Type 2 diabetes mellitus with diabetic peripheral angiopathy without gangrene: Secondary | ICD-10-CM | POA: Diagnosis not present

## 2023-10-18 DIAGNOSIS — I739 Peripheral vascular disease, unspecified: Secondary | ICD-10-CM | POA: Diagnosis not present

## 2023-10-18 DIAGNOSIS — I25119 Atherosclerotic heart disease of native coronary artery with unspecified angina pectoris: Secondary | ICD-10-CM | POA: Diagnosis not present

## 2023-10-18 DIAGNOSIS — E7800 Pure hypercholesterolemia, unspecified: Secondary | ICD-10-CM | POA: Diagnosis not present

## 2023-10-18 DIAGNOSIS — I4891 Unspecified atrial fibrillation: Secondary | ICD-10-CM | POA: Diagnosis not present

## 2023-10-20 ENCOUNTER — Other Ambulatory Visit: Payer: Self-pay | Admitting: Nurse Practitioner

## 2023-10-20 DIAGNOSIS — C210 Malignant neoplasm of anus, unspecified: Secondary | ICD-10-CM

## 2023-10-20 NOTE — Assessment & Plan Note (Deleted)
 Metastatic squamous cell carcinoma involving lymph node with unknown primary site [facility]) -cT1N0M0  -she had metastatic squamous cell carcinoma to left groin lymph nodes in February 2020, PET scan was negative for primary tumor, she underwent definitive radiation. -She was found to have anal cancer in 02/2021 -Status post concurrent chemoradiation, completed in April 2023. -Posttreatment PET scan in July 2023 showed treatment response, no evidence of metastasis. -Surveillance CT scan from March 15, 2022 showed unchanged soft tissue thickening involving the low rectum and anus, no evidence of recurrent disease.  -01/28/2023 - restaging CT CAP - Unchanged circumferential wall thickening of the low rectum and anus. No evidence of lymphadenopathy or metastatic disease in the chest, abdomen, or pelvis. -02/21/2023 - benign exam. Patient scheduled to see Dr. Ladena, colorectal surgeon, tomorrow. Will see her back with labs in 3 months. Will get CT CAP or PET in 6 months. This was ordered today and should be scheduled for 07/2023.  -visit to surgeon in April 2025. No evidence of anal cancer recurrence. He will see her back in 10/2023.  - PET/CT done 07/08/2023 showed no evidence of recurrence or metastatic disease. - Plan for subsequent surveillance CT CAP in 6 months. - Labs and follow-up in 3 months, sooner if needed.

## 2023-10-20 NOTE — Progress Notes (Deleted)
 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 -She was found to have anal cancer in 02/2021 -Status post concurrent chemoradiation, completed in April 2023. -Posttreatment PET scan in July 2023 showed treatment response, no evidence of metastasis. -Surveillance CT scan from March 15, 2022 showed unchanged soft tissue thickening involving the low rectum and anus, no evidence of recurrent disease.  -01/28/2023 - restaging CT CAP - Unchanged circumferential wall thickening of the low rectum and anus. No evidence of lymphadenopathy or metastatic disease in the chest, abdomen, or pelvis. -02/21/2023 - benign exam. Patient scheduled to see Dr. Ladena, colorectal surgeon, tomorrow. Will see her back with labs in 3 months. Will get CT CAP or PET in 6 months. This was ordered today and should be scheduled for 07/2023.  -visit to surgeon in April 2025. No evidence of anal cancer recurrence. He will see her back in 10/2023.  - PET/CT done 07/08/2023 showed no evidence of recurrence or metastatic disease. - Plan for subsequent surveillance CT CAP in 6 months. - Labs and follow-up in 3 months, sooner if needed.    The patient understands the plans discussed today and is in agreement with them.  She knows to contact our office if she develops concerns prior to her next appointment.  I provided *** minutes of face-to-face time  during this encounter and > 50% was spent counseling as documented under my assessment and plan.    Powell FORBES Lessen, NP  [facility] - A DEPT OF [facility] [address] [place] [address] Dept: [phone] Dept Fax: [phone]   No orders of the defined types were placed in this encounter.     CHIEF COMPLAINT:  CC: Anal cancer  Current Treatment: Surveillance  INTERVAL HISTORY:  Lorraine Gilbert is here today for repeat clinical assessment.  She last saw me on 07/20/2023.  She denies fevers or chills. She denies pain. Her appetite is good. Her weight {Weight change:10426}.  I have reviewed the past medical history, past surgical history, social history and family history with the patient and they are unchanged from previous note.  ALLERGIES:  is allergic to ibuprofen.  MEDICATIONS:  Current Outpatient Medications  Medication Sig Dispense Refill   ACCU-CHEK AVIVA PLUS test strip 1 each as directed.     Accu-Chek Softclix Lancets lancets 1 each by Other route as directed.     ACETAMINOPHEN  8 HOUR PO Take 1 tablet by mouth as needed.     albuterol (PROVENTIL) (2.5 MG/3ML) 0.083% nebulizer solution SMARTSIG:1 Vial(s) Via Nebulizer Every 4-6 Hours PRN     Alcohol Swabs (ALCOHOL WIPES) 70 % PADS DropSafe Alcohol Prep Pads     amiodarone  (PACERONE ) 100 MG tablet Take 100 mg by mouth daily.     Blood Glucose Calibration (ACCU-CHEK AVIVA) SOLN      Blood Glucose Monitoring Suppl (TRUE METRIX AIR GLUCOSE METER) w/Device KIT See admin instructions.  ELIQUIS  2.5 MG TABS tablet Take 2.5 mg by mouth 2 (two) times daily.     levalbuterol  (XOPENEX ) 1.25 MG/3ML nebulizer solution Inhale into the lungs.     levETIRAcetam  (KEPPRA ) 500 MG tablet Take 1 tablet (500 mg total) by mouth 2 (two) times daily. 180 tablet 3   megestrol  (MEGACE ) 400 MG/10ML suspension Take 10 mLs (400 mg total) by mouth daily. 240 mL 3   mirtazapine   (REMERON ) 15 MG tablet TAKE 1 TABLET BY MOUTH AT BEDTIME 30 tablet 0   nitroGLYCERIN (NITROSTAT) 0.4 MG SL tablet Place 0.4 mg under the tongue every 5 (five) minutes as needed for chest pain.     polyethylene glycol powder (GLYCOLAX /MIRALAX ) 17 GM/SCOOP powder Take by mouth.     TRELEGY ELLIPTA 200-62.5-25 MCG/ACT AEPB Take 1 puff by mouth daily.     No current facility-administered medications for this visit.    HISTORY OF PRESENT ILLNESS:   Oncology History Overview Note   Cancer Staging  Anal cancer [facility]) Staging form: Anus, AJCC V9 - Clinical stage from 01/30/2021: Stage I (cT1, cN0, cM0) - Signed by Lanny Callander, MD on 03/11/2021 Stage prefix: Initial diagnosis Histologic grade (G): G2 Histologic grading system: 4 grade system     Metastatic squamous cell carcinoma involving lymph node with unknown primary site (HCC)  02/06/2018 Imaging   Impression CT abdomen and pelvis 1.  Enlarged left groin lymph node which is of uncertain etiology. This would be amenable to percutaneous sampling if deemed clinically appropriate. 2.  Hepatic steatosis. Focal area of low-attenuation in the far lateral left hepatic lobe, not clearly seen on the prior exam. Consider liver ultrasound for further evaluation.  3.  Mild L1 compression deformity which is new.   02/28/2018 Pathology Results   Left groin lymph node, needle core biopsy:       -Metastatic squamous cell carcinoma, see comment  P16 stain is strongly positive, suggestive of cervical or anogenital origin in this location. Immunohistochemical stains for CK5, p63, and pan cytokeratin are positive. ER is weakly positive. CK7 and CK20 are negative.   03/15/2018 Pathology Results   1. Cervix, biopsy, 12 o'clock - BENIGN SQUAMOUS MUCOSA WITH INFLAMMATION. - NO DYSPLASIA OR MALIGNANCY. 2. Endocervix, curettage - BENIGN SQUAMOUS EPITHELIUM.   03/23/2018 PET scan   1. Isolated intensely hypermetabolic enlarged LEFT inguinal lymph node. No clear  primary identified. 2. No abnormal activity associated with the vagina or anorectal tissue. 3. No hypermetabolic lymph nodes in the pelvis. 4. Two adjacent nodules in the RIGHT upper lobe with very low metabolic activity. These are not favored primary malignancies. Recommend follow-up per Fleischner criteria. Non-contrast chest CT at 6-12 months is recommended.    07/05/2018 - 07/18/2018 Radiation Therapy   Left inguinal area/nodes treated to 30 Gy in 10 fractions   09/22/2020 Imaging   EXAM: CT ABDOMEN PELVIS W IV CONTRAST   IMPRESSION:  1. Multicystic enhancing right adnexal lesion with mild surrounding stranding. Given short interval since recent CT 3 weeks prior, this is more suspicious for acute findings such as torsion or infection, less likely neoplasm. Recommend endovaginal ultrasound for further evaluation and follow-up to resolution.  2. Other stable chronic findings.    01/30/2021 Pathology Results   DIAGNOSIS   SMALL  TUFT OF TISSUE ABOVE ANAL VERGE, ENDOSCOPIC BIOPSIES:   - HIGH-GRADE SQUAMOUS INTRAEPITHELIAL LESION/ANAL INTRAEPITHELIAL NEOPLASIA 2-3 OUT OF 3 (HGSIL/AIN 2-3).   - SEE COMMENT.  ([id number])   COMMENT  MORPHOLOGICALLY THERE IS AT LEAST TWO  THIRDS THICKNESS INVOLVEMENT BY DYSPLASTIC EPITHELIUM WITH MID-LEVEL MITOSES SECONDARY HOWEVER DUE TO POOR ORIENTATION AND MORPHOLOGIC ASSESSMENT IS 2 SERVICE MUCOSA IS ONLY PRESENT IN FOCAL AREAS MORPHOLOGICALLY THIS REPRESENTS AT LEAST AIN 2 AND MOST LIKELY AIN 3.  IN EITHER RESPECT THE FINDINGS ARE CONSISTENT WITH HIGH-GRADE SQUAMOUS INTRAEPITHELIAL LESION WHICH IS ALSO CONFIRMED BY STRONGLY BLOCK LIKE P16 IMMUNOHISTOCHEMICAL  POSITIVITY.    03/05/2021 Pathology Results   Final Diagnosis    Rectal mass, biopsy: -Invasive squamous cell carcinoma, moderately differentiated   Addendum 1    The carcinoma was analyzed by IHC for DNA mismatch repair proteins.  The neoplasm retained nuclear expression of all four mismatch repair  proteins, MLH1, PMS2, MSH2, MSH6.  Controls worked appropriately.     Anal cancer (HCC)  01/30/2021 Cancer Staging   Staging form: Anus, AJCC V9 - Clinical stage from 01/30/2021: Stage I (cT1, cN0, cM0) - Signed by Lanny Callander, MD on 03/11/2021 Stage prefix: Initial diagnosis Histologic grade (G): G2 Histologic grading system: 4 grade system   03/11/2021 Initial Diagnosis   Anal cancer (HCC)   03/30/2021 - 05/01/2021 Chemotherapy   Patient is on Treatment Plan : ANUS Mitomycin  D1,28 / 5FU D1-4, 28-31 q32d     03/15/2022 Imaging    IMPRESSION: 1. Unchanged soft tissue thickening involving the low rectum and anus, in keeping with patient's known anal malignancy. 2. No evidence of lymphadenopathy or metastatic disease in the chest, abdomen, or pelvis. 3. Mild pulmonary fibrosis in a pattern with apical to basal gradient, featuring irregular peripheral interstitial opacity, septal thickening, traction bronchiectasis, and small areas of subpleural bronchiolectasis at the lung bases without clear evidence of honeycombing. Findings are consistent with probable UIP pattern fibrosis by ATS pulmonary fibrosis criteria.     01/28/2023 Imaging   CT chest abdomen and pelvis with contrast  IMPRESSION: 1. Unchanged circumferential wall thickening of the low rectum and anus, in keeping with anorectal malignancy. 2. No evidence of lymphadenopathy or metastatic disease in the chest, abdomen, or pelvis. 3. Unchanged mild bibasilar predominant pulmonary fibrosis featuring irregular peripheral interstitial opacity and septal thickening as well as traction bronchiectasis and subpleural bronchiolectasis. Findings remain consistent with probable UIP pattern fibrosis. 4. Cardiomegaly and coronary artery disease.   Aortic Atherosclerosis (ICD10-I70.0).       07/08/2023 PET scan   IMPRESSION: 1. No evidence of recurrent or metastatic disease. 2. Focal hypermetabolism in the right thyroid , corresponding to a  6 mm nodule on 01/28/2023. Typically, thyroid  ultrasound and biopsy would be recommended. However, in a patient of this age and with comorbidities, workup is at the discretion of the referring provider. 3. Aortic atherosclerosis (ICD10-I70.0). Coronary artery calcification.         REVIEW OF SYSTEMS:   Constitutional: Denies fevers, chills or abnormal weight loss Eyes: Denies blurriness of vision Ears, nose, mouth, throat, and face: Denies mucositis or sore throat Respiratory: Denies cough, dyspnea or wheezes Cardiovascular: Denies palpitation, chest discomfort or lower extremity swelling Gastrointestinal:  Denies nausea, heartburn or change in bowel habits Skin: Denies abnormal skin rashes Lymphatics: Denies new lymphadenopathy or easy bruising Neurological:Denies numbness, tingling or new weaknesses Behavioral/Psych: Mood is stable, no new changes  All other systems were reviewed with the patient and are negative.   VITALS:  There were no vitals taken for this visit.  Wt Readings from Last 3 Encounters:  07/20/23 125 lb 8 oz (56.9 kg)  07/08/23 131 lb (59.4 kg)  05/23/23 125 lb (56.7 kg)    There  is no height or weight on file to calculate BMI.  Performance status (ECOG): {CHL ONC [id number]  PHYSICAL EXAM:   GENERAL:alert, no distress and comfortable SKIN: skin color, texture, turgor are normal, no rashes or significant lesions EYES: normal, Conjunctiva are pink and non-injected, sclera clear OROPHARYNX:no exudate, no erythema and lips, buccal mucosa, and tongue normal  NECK: supple, thyroid  normal size, non-tender, without nodularity LYMPH:  no palpable lymphadenopathy in the cervical, axillary or inguinal LUNGS: clear to auscultation and percussion with normal breathing effort HEART: regular rate & rhythm and no murmurs and no lower extremity edema ABDOMEN:abdomen soft, non-tender and normal bowel sounds Musculoskeletal:no cyanosis of digits and no clubbing   NEURO: alert & oriented x 3 with fluent speech, no focal motor/sensory deficits  LABORATORY DATA:  I have reviewed the data as listed    Component Value Date/Time   NA 143 07/20/2023 0955   K 4.8 07/20/2023 0955   CL 107 07/20/2023 0955   CO2 27 07/20/2023 0955   GLUCOSE 134 (H) 07/20/2023 0955   BUN 18 07/20/2023 0955   CREATININE 1.04 (H) 07/20/2023 0955   CALCIUM 9.9 07/20/2023 0955   PROT 8.0 07/20/2023 0955   ALBUMIN 4.2 07/20/2023 0955   AST 30 07/20/2023 0955   ALT 45 (H) 07/20/2023 0955   ALKPHOS 87 07/20/2023 0955   BILITOT 0.4 07/20/2023 0955   GFRNONAA 51 (L) 07/20/2023 0955   GFRAA >60 07/28/2017 1340    No results found for: SPEP, UPEP  Lab Results  Component Value Date   WBC 5.7 07/20/2023   NEUTROABS 3.7 07/20/2023   HGB 14.0 07/20/2023   HCT 43.4 07/20/2023   MCV 96.7 07/20/2023   PLT 185 07/20/2023      Chemistry      Component Value Date/Time   NA 143 07/20/2023 0955   K 4.8 07/20/2023 0955   CL 107 07/20/2023 0955   CO2 27 07/20/2023 0955   BUN 18 07/20/2023 0955   CREATININE 1.04 (H) 07/20/2023 0955      Component Value Date/Time   CALCIUM 9.9 07/20/2023 0955   ALKPHOS 87 07/20/2023 0955   AST 30 07/20/2023 0955   ALT 45 (H) 07/20/2023 0955   BILITOT 0.4 07/20/2023 0955       RADIOGRAPHIC STUDIES: I have personally reviewed the radiological images as listed and agreed with the findings in the report. No results found.

## 2023-10-21 ENCOUNTER — Inpatient Hospital Stay: Admitting: Nurse Practitioner

## 2023-10-21 ENCOUNTER — Inpatient Hospital Stay: Attending: Nurse Practitioner

## 2023-10-21 DIAGNOSIS — M2042 Other hammer toe(s) (acquired), left foot: Secondary | ICD-10-CM | POA: Diagnosis not present

## 2023-10-21 DIAGNOSIS — I739 Peripheral vascular disease, unspecified: Secondary | ICD-10-CM | POA: Diagnosis not present

## 2023-10-21 DIAGNOSIS — E1151 Type 2 diabetes mellitus with diabetic peripheral angiopathy without gangrene: Secondary | ICD-10-CM | POA: Diagnosis not present

## 2023-10-21 DIAGNOSIS — L851 Acquired keratosis [keratoderma] palmaris et plantaris: Secondary | ICD-10-CM | POA: Diagnosis not present

## 2023-10-21 DIAGNOSIS — M2041 Other hammer toe(s) (acquired), right foot: Secondary | ICD-10-CM | POA: Diagnosis not present

## 2023-10-21 DIAGNOSIS — B351 Tinea unguium: Secondary | ICD-10-CM | POA: Diagnosis not present

## 2023-10-21 DIAGNOSIS — C210 Malignant neoplasm of anus, unspecified: Secondary | ICD-10-CM

## 2023-10-21 NOTE — Progress Notes (Signed)
 Patient ID:  Bricia Taher is a 89 y.o. (DOB [birth year]) female.   Assessment   1. Diabetic angiopathy (*)   2. PAD (peripheral artery disease)   3. Hammer toes of both feet   4. Onychomycosis   5. Acquired keratoderma   6. Pes cavus of both feet     Plan  I did review the nature of the patient's condition with the patient in great length. The primary encounter diagnosis was Diabetic angiopathy (*). Diagnoses of PAD (peripheral artery disease), Hammer toes of both feet, Onychomycosis, Acquired keratoderma, and Pes cavus of both feet were also pertinent to this visit.   Evaluated patient and discussed their underlying clinical condition as well as clinical examination findings in detail with the patient to their noted understanding.  In depth discussion was had in pertaining to diabetes its podiatric manifestation as well as precautions with the patient.  I discussed and recommended patient be in supportive shoes and supportive inserts at all times, I also discussed custom inserts and shoes with the patient.  I discussed the benefits of these given patient's overall foot type.  Discussed the importance of routine foot care, and foot monitoring as well as proper foot hygiene with the patient in detail. Patient is to refrain from soaking their feet unless instructed by myself to do otherwise. They are to refrain from walking barefoot, inside or outside.  Shoe gear should be inspected daily for any foreign objects. Recommended shoe gear was discussed today.  I explained to the patient that with any shoe changes, it is important to inspect their feet for any signs of pressure or irritation: redness, blistering, or open sores.  Patient gets discomfort pain and tenderness vague and diffuse in nature most notably the plantar forefoot due to fat pad atrophy prominence of the metatarsal heads as well as her pes cavus foot type and lateral column overload I discussed this etiology in detail with the  patient and I discussed the burning culprit is likely due to the over excessive pressure at this level and I gave recommendations for better supportive shoe gear as her current shoe gear is slip on sandals in nature a list was discussed and given to the patient which I do believe would benefit her as well as inserts with accommodative padding options to help offload the forefoot level to decrease the pressure and friction to this level especially the fifth metatarsal head which she distributes hyperkeratosis to this area.  All questions were answered the patient notable understanding also prescribed her an ointment to help with her dry skin as well as callus formation of the plantar aspect of the foot to be utilized as needed and prescribed.  A prescription for the below medication has been sent to the pharmacy.  The patient does understand the risks and benefits of the medication and if any adverse reactions do occur, then the medication will be discontinued and the office will be contacted.  The patient was dispensed a pair of non-custom insoles.  They are medically necessary and within the standard of care for the patient's diagnosis.  The patient was instructed on the proper fit and break-in period.  The patient was also instructed to watch for areas of rubbing, irritation, blister formation, or any other signs of abnormal pressure.  If this occurs, the patient is to contact the office immediately.  The patient has seen a primary care physician for treatment and/or evaluation of the complicating disease process within the preceding  six months. Based on the below findings the patient is eligible for treatment for routine foot care today.   Class A Findings (choose one): None.   Class B Findings (choose two): The patient does have  the absence of a Posterior Tibial pulse and three of the following advanced trophic changes:  Hair growth decreased or absent, Nail changes, and Pigmentary changes.   Class  C Findings (choose one if there is 1 Class B and 2 Class C findings present): The following symptoms are also present in the patient: temperature changes and edema.  Utilizing a sterile nail nipper and sterile electronic rotary bur, nails x 10 were debrided in thickness, dystrophy, length to patient satisfaction without incidence with notable improvement in pain following debridement.  X 2 total hyperkeratotic lesions to the bilateral foot, as noted in objective exam, were sharply reduced/debrided using sharp instrumentation for selective debridement to the level of healthy pink tissue.  Health maintenance issues including healthy diet and exercise were discussed with the patient.  The risk, benefits, and alternatives of the medications and treatment plan prescribed today were discussed, and patient expressed understanding and agreement with the plan.   No orders of the defined types were placed in this encounter.  Follow up if symptoms worsen or fail to improve.   Patient's Medications       * Accurate as of October 21, 2023  9:31 AM. Reflects encounter med changes as of last refresh          New Prescriptions      Instructions  ammonium lactate 12 % lotion Commonly known as: LAC-HYDRIN,AMLACTIN Started by: Victory Birmingham, DPM  Topical, 2 times a day, Apply 1 g to the bilateral feet.       Continued Medications      Instructions  acetaminophen  325 mg tablet Commonly known as: TYLENOL   325-650 mg, Oral, Every 6 hours as needed   * albuterol sulfate HFA 108 (90 Base) MCG/ACT inhaler Commonly known as: PROVENTIL,VENTOLIN,PROAIR  1-2 puffs, Inhalation, Every 6 hours as needed   * albuterol sulfate 2.5 mg/3 mL nebulizer solution Commonly known as: PROVENTIL    amiodarone  200 mg tablet Commonly known as: PACERONE   100 mg, Oral, Daily, 0   atorvastatin 40 mg tablet Commonly known as: LIPITOR  40 mg, Daily   cilostazol 100 mg tablet Commonly known as: PLETAL  100 mg,  Oral, 2 times a day   Cyanocobalamin  1000 MCG Subl  1,000 mcg, 30 minutes before lunch   ELIQUIS  2.5 MG tablet Generic drug: apixaban   2.5 mg, Oral, 2 times a day   ergocalciferol 50,000 units Caps capsule Commonly known as: Vitamin D2  1 capsule, Weekly at 0900   famotidine  20 mg tablet Commonly known as: PEPCID   Please take 30 minutes before breakfast and dinner   fluticasone -salmeterol 115-21 MCG/ACT inhaler Commonly known as: ADVAIR HFA  2 puffs, 2 times a day as needed   folic acid 1 mg tablet  3 tablets, Daily   gabapentin  100 mg capsule Commonly known as: NEURONTIN   100 mg, 3 times daily   hyoscyamine sulfate 0.125 mg SL tablet Commonly known as: LEVSIN/SL  1 sublingually every 2 hours as needed bloating discomfort, may use up to 6 in a 24-hour period   ipratropium 0.03% nasal spray Commonly known as: ATROVENT  2 sprays, Every 12 hours scheduled   levalbuterol  1.25 MG/3ML nebulizer solution Commonly known as: XOPENEX     levETIRAcetam  750 mg tablet Commonly known as: KEPPRA   750 mg, 2 times a day   levothyroxine sodium 50 mcg tablet Commonly known as: SYNTHROID,LEVOTHROID,LOVOXYL  1 tablet in the morning on an empty stomach Orally Once a day for 30 days   megestrol  acetate 400 mg/10 mL Commonly known as: MEGACE   400 mg, Daily   mirtazapine  15 mg tablet Commonly known as: REMERON   15 mg, At bedtime   nitroGLYCERIN 0.4 mg SL tablet Commonly known as: NITROSTAT  0.4 mg, Sublingual, Every 5 minutes as needed   polyethylene glycol 17 g packet Commonly known as: MIRALAX   17 g, Daily   rosuvastatin calcium 5 mg tablet Commonly known as: CRESTOR  1 tablet   telmisartan 20 MG tablet Commonly known as: MICARDIS  20 mg, Daily      * * This list has 2 medication(s) that are the same as other medications prescribed for you. Read the directions carefully, and ask your doctor or other care provider to review them with you.             Allergies Allergies[1]  Subjective    HPI:  Lillar Bianca is a 89 y.o. female who presents today for overall at risk foot check and footcare in regards to painful calluses on her feet as well as pain to the outside and bottoms of the balls of her feet which has developed left greater than right.  It is painful when she walks denies any injury but increases part throughout her day.  She has trouble taking care of her feet given her age as well as fear of self-harm her last A1c was 7.3.  Objective   Physical Exam:   Constitutional: The patient is alert and oriented x 3, well-nourished and well developed and in no apparent distress.  Vascular: The dorsalis pedis pulses are diminished posterior tibial pulses are nonpalpable secondary to edema in this region.  Hair growth is decreased.  There is good perfusion to the digital level with a capillary refill time of less than 3 seconds to the digits, bilaterally.  There is normal proximal to distal cooling of both lower extremities.  There is mild edema to the medial rear foot bilateral.  Neurological: Epicritic and protective sensation is grossly intact to both lower extremities.   Dermatological:  Skin atrophic thin and friable nature of the bilateral foot and ankle.  Nails 1 through 5 bilaterally are thickened dystrophic and elongated in nature there is slight incurvation of the bilateral hallux nails no signs of infection no further pain or tenderness upon debridement.  Hyperkeratosis subfifth metatarsal head bilateral.  Musculoskeletal: There is good ankle joint dorsiflexion with the knee extended and the knee flexed bilaterally.  No pain or crepitus are noted with range of motion of the subtalar and the ankle joints bilaterally.  There is good stability noted of the ankle, subtalar, mid-tarsal and metatarsophalangeal joints of both lower extremities.  There are no signs of effusions or masses noted bilaterally.  All muscle groups have normal  strength, tone, stability and range of motion with 5/5 musculoskeletal strength present in both active and resisted range of motion.  A normal base and angle of gait are noted bilaterally.  Lesser digit hammertoe contractures noted bilaterally.  Prominence of the lesser metatarsal heads most substantially the fifth metatarsal head with mild discomfort diffusely lateral column as well as the subfifth metatarsal head region to the prominence left greater than right foot.  Good musculoskeletal without deficiency.  Diabetic foot exam: Left:    Monofilament test: Sensation normal  Pulses: diminshed     Skin: Normal and no erythema, no cyanosis or pallor      Other findings: callus and hammer toe Right:    Monofilament test: Sensation normal     Pulses: diminshed     Skin: Normal and no erythema, no cyanosis or pallor      Other findings: callus and hammer toe Exam performed with shoes and socks removed.  Review of Systems General ROS: negative Opthalmic ROS: negative ENT ROS: negative Cardiovascular ROS: negative Respiratory ROS: negative Gastrointestinal ROS: negative Genito-Urinary ROS: negative Musculoskeletal ROS: negative Dermatological ROS: negative Neurological ROS: negative Pyschological ROS: negative Endocrine ROS: negative Hematological & Lymphatic ROS: negative Allergy & Immunology ROS: negative  There were no vitals taken for this visit.   Patient Active Problem List   Diagnosis Date Noted  . Atrial fibrillation by electrocardiogram (*) 05/17/2023  . Generalized osteoarthritis 05/17/2023  . Hyperesthesia 05/17/2023  . Joint pain 05/17/2023  . Rotator cuff tear arthropathy 05/17/2023  . Sciatica 05/17/2023  . Dysphagia, post-stroke 05/02/2023  . Stroke (*) 05/02/2023  . Moderate protein-calorie malnutrition (*) 04/27/2023  . Thyroid  nodule 04/27/2023  . Low back pain 03/11/2023  . Cognitive communication deficit 11/10/2022  . Neurological deficit present  11/01/2022  . Generalized weakness 06/07/2022  . Balance problems 06/07/2022  . Spinal stenosis of lumbar region 06/07/2022  . Fecal smearing 10/23/2021  . SBO (small bowel obstruction) (*) 05/12/2021  . Dehydration 05/12/2021  . Rib pain on right side 03/30/2021  . Anal cancer (*) 03/10/2021  . Rectal bleed 03/09/2021    Added automatically from request for surgery [id number]   . Rectal bleeding 03/09/2021  . Hemorrhage of anus and rectum 03/09/2021    Formatting of this note might be different from the original. Formatting of this note might be different from the original. Added automatically from request for surgery [id number] Formatting of this note might be different from the original. Formatting of this note might be different from the original. Formatting of this note might be different from the original. Added automatically from request for surgery [id number] Formatting of this note might be different from the original. Formatting of this note might be different from the original. Added automatically from request for surgery [id number]   . Anal lesion 02/26/2021    Added automatically from request for surgery [id number] Formatting of this note might be different from the original.  Formatting of this note might be different from the original. Added automatically from request for surgery [id number] Formatting of this note might be different from the original.  Formatting of this note might be different from the original. Formatting of this note might be different from the original. Added automatically from request for surgery [id number] Formatting of this note might be different from the original. Added automatically from request for surgery [id number] Formatting of this note might be different from the original.  Formatting of this note might be different from the original.   Formatting of this note might be different from the original. Formatting of this note might be different from the original. Added  automatically from request for surgery [id number] Formatting of this note might be different from the original. Added automatically from request for surgery [id number]  Formatting of this note might be different from the original.   Formatting of this note might be different from the original. Added automatically from request for surgery [id number]  Formatting of this note might be different from the original.   Added automatically from  request for surgery [id number]   . Arthritis 05/04/2020  . Anxiety 05/04/2020  . Atherosclerosis of aorta 05/04/2020  . Bowel incontinence 05/04/2020  . Chronic idiopathic constipation 05/04/2020  . Diabetes mellitus without complication (*) 05/04/2020  . Fatty liver 05/04/2020  . Inflammatory and toxic neuropathy (*) 05/04/2020  . Irritable bowel syndrome 05/04/2020  . Polymyalgia rheumatica (*) 05/04/2020  . Primary open angle glaucoma (POAG) of both eyes, mild stage 05/04/2020  . Symptomatic sinus bradycardia 05/04/2020  . Hardening of the aorta (main artery of the heart) 05/04/2020  . Chronic venous insufficiency 12/14/2019  . Monckeberg's medial calcinosis 12/14/2019  . Peripheral vascular disease 12/14/2019  . Allergic rhinitis 08/07/2019    Last Assessment & Plan:  Formatting of this note might be different from the original. Plan: Continue Singulair Start nasal saline rinses Start a daily antihistamine Formatting of this note might be different from the original.  Last Assessment & Plan: Formatting of this note might be different from the original. Plan: Continue Singulair Start nasal saline rinses Start a daily antihistamine Formatting of this note might be different from the original. Last Assessment & Plan: Formatting of this note might be different from the original. Plan: Continue Singulair Start nasal saline rinses Start a daily antihistamine   . Abnormal findings on diagnostic imaging of lung 08/07/2019    Last Assessment & Plan:  Formatting of  this note might be different from the original.  Plan:  Chest x-ray today  Obtain CT imaging as currently planned in August/2021 Formatting of this note might be different from the original.  Last Assessment & Plan: Formatting of this note might be different from the original. Plan: Chest x-ray today Obtain CT imaging as currently planned in August/2021 Formatting of this note might be different from the original.  Formatting of this note might be different from the original.   Last Assessment & Plan: Formatting of this note might be different from the original. Plan: Chest x-ray today Obtain CT imaging as currently planned in August/2021  Last Assessment & Plan:    Formatting of this note might be different from the original.   Plan:   Chest x-ray today   Obtain CT imaging as currently planned in August/2021   . Pain in limb 06/12/2019    Last Assessment & Plan:  Formatting of this note might be different from the original. Patient appears to be having claudication pain in the lower extremities.  He is she is following up with cardiology and has arterial and venous Dopplers planned for later this week.  Continue follow-up. Formatting of this note might be different from the original.  Formatting of this note might be different from the original. Last Assessment & Plan: Formatting of this note might be different from the original. Patient appears to be having claudication pain in the lower extremities.  He is she is following up with cardiology and has arterial and venous Dopplers planned for later this week.  Continue follow-up.   . Atherosclerosis of native artery of extremity with intermittent claudication 06/12/2019    Last Assessment & Plan:  Formatting of this note might be different from the original.  Patient appears to be having claudication pain in the lower extremities.  He is she is following up with cardiology and has arterial and venous Dopplers planned for later this week.  Continue  follow-up. Formatting of this note might be different from the original.  Last Assessment & Plan: Formatting of this note might be  different from the original. Patient appears to be having claudication pain in the lower extremities.  He is she is following up with cardiology and has arterial and venous Dopplers planned for later this week.  Continue follow-up. Formatting of this note might be different from the original.  Formatting of this note might be different from the original.   Formatting of this note might be different from the original. Last Assessment & Plan: Formatting of this note might be different from the original. Patient appears to be having claudication pain in the lower extremities.  He is she is following up with cardiology and has arterial and venous Dopplers planned for later this week.  Continue follow-up.  Formatting of this note might be different from the original.   Last Assessment & Plan:    Formatting of this note might be different from the original.   Patient appears to be having claudication pain in the lower extremities.  He is she is following up with cardiology and has arterial and venous Dopplers planned for later this week.  Continue follow-up.  Formatting of this note might be different from the original.   Last Assessment & Plan: Formatting of this note might be different from the original. Patient appears to be having claudication pain in the lower extremities.  He is she is following up with cardiology and has arterial and venous Dopplers planned for later this week.  Continue follow-up.  Last Assessment & Plan:    Formatting of this note might be different from the original.   Patient appears to be having claudication pain in the lower extremities.  He is she is following up with cardiology and has arterial and venous Dopplers planned for later this week.  Continue follow-up.   . Degeneration of lumbar intervertebral disc 06/07/2019  . Leg pain, bilateral  05/22/2019  . Pain in both lower extremities 05/22/2019  . Degeneration of cervical intervertebral disc 05/17/2019  . Other cervical disc degeneration, unspecified cervical region 05/17/2019  . S/P coronary artery stent placement 10/27/2018  . Elevated troponin 10/26/2018  . Depression with anxiety 10/26/2018  . Abnormal stress test 05/13/2018  . Coronary artery disease involving native coronary artery without angina pectoris 05/13/2018  . Pulmonary nodule 03/24/2018    Formatting of this note might be different from the original.  - 2 small pulmonary nodules noted to left upper lobe on  PET scan 03/23/18  (6mm LUL nodule not hypermetabolic). Very low metabolic activity, not consistent with primary source  - CT 03/02/19: 1. Scattered ground-glass nodularity bilaterally, greatest in the  lower lobes subpleural distribution. Overall, pattern suggest  inflammatory or infectious change.  2. The left upper lobe nodules reported on prior PET scan are no  longer identified.  3. Likely reactive borderline enlarged mediastinal and right hilar  lymph nodes. No frankly pathologic adenopathy.  -  CT chest 05/28/20  Resolved or resolving nodules  (likely RA related)   Last Assessment & Plan: Formatting of this note might be different from the original.  - 2 small pulmonary nodules noted to left upper lobe on  PET scan 03/23/18  (6mm LUL nodule not hypermetabolic). Very low metabolic activity, not consistent with primary source  - CT 03/02/19: 1. Scattered ground-glass nodularity bilaterally, greatest in the  lower lobes subpleural distribution. Overall, pattern suggest  inflammatory or infectious change.  2. The left upper lobe nodules reported on prior PET scan are no  longer identified.  3. Likely reactive borderline enlarged mediastinal and  right hilar  lymph nodes. No frankly pathologic adenopathy.  -  CT chest 05/28/20  Resolved or resolving nodules  (likely RA related)   No pulmonary f/u needed for this problem  Formatting of this note might be different from the original.  Formatting of this note might be different from the original. - 2 small pulmonary nodules noted to left upper lobe on  PET scan 03/23/18  (6mm LUL nodule not hypermetabolic). Very low metabolic activity, not consistent with primary source - CT 03/02/19: 1. Scattered ground-glass nodularity bilaterally, greatest in the lower lobes subpleural distribution. Overall, pattern suggest inflammatory or infectious change. 2. The left upper lobe nodules reported on prior PET scan are no longer identified. 3. Likely reactive borderline enlarged mediastinal and right hilar lymph nodes. No frankly pathologic adenopathy. -  CT chest 05/28/20  Resolved or resolving nodules  (likely RA related) Last Assessment & Plan: Formatting of this note might be different from the original. - 2 small pulmonary nodules noted to left upper lobe on  PET scan 03/23/18  (6mm LUL nodule not hypermetabolic). Very low metabolic activity, not consistent with primary source - CT 03/02/19: 1. Scattered ground-glass nodularity bilaterally, greatest in the lower lobes subpleural distribution. Overall, pattern suggest inflammatory or infectious change. 2. The left upper lobe nodules reported on prior PET scan are no longer identified. 3. Likely reactive borderline enlarged mediastinal and right hilar lymph nodes. No frankly pathologic adenopathy. -  CT chest 05/28/20  Resolved or resolving nodules  (likely RA related) No pulmonary f/u needed for this problem Formatting of this note might be different from the original.  Formatting of this note might be different from the original.   Formatting of this note might be different from the original. - 2 small pulmonary nodules noted to left upper lobe on  PET scan 03/23/18  (6mm LUL nodule not hypermetabolic). Very low metabolic activity, not consistent with primary source - CT 03/02/19: 1. Scattered ground-glass nodularity bilaterally, greatest in the lower  lobes subpleural distribution. Overall, pattern suggest inflammatory or infectious change. 2. The left upper lobe nodules reported on prior PET scan are no longer identified. 3. Likely reactive borderline enlarged mediastinal and right hilar lymph nodes. No frankly pathologic adenopathy. -  CT chest 05/28/20  Resolved or resolving nodules  (likely RA related) Last Assessment & Plan: Formatting of this note might be different from the original. - 2 small pulmonary nodules noted to left upper lobe on  PET scan 03/23/18  (6mm LUL nodule not hypermetabolic). Very low metabolic activity, not consistent with primary source - CT 03/02/19: 1. Scattered ground-glass nodularity bilaterally, greatest in the lower lobes subpleural distribution. Overall, pattern suggest inflammatory or infectious change. 2. The left upper lobe nodules reported on prior PET scan are no longer identified. 3. Likely reactive borderline enlarged mediastinal and right hilar lymph nodes. No frankly pathologic adenopathy. -  CT chest 05/28/20  Resolved or resolving nodules  (likely RA related) No pulmonary f/u needed for this problem  Formatting of this note might be different from the original.   - 2 small pulmonary nodules noted to left upper lobe on  PET scan 03/23/18  (6mm LUL nodule not hypermetabolic). Very low metabolic activity, not consistent with primary source   - CT 03/02/19:    1. Scattered ground-glass nodularity bilaterally, greatest in the   lower lobes subpleural distribution. Overall, pattern suggest   inflammatory or infectious change.   2. The left upper lobe nodules reported on prior  PET scan are no   longer identified.   3. Likely reactive borderline enlarged mediastinal and right hilar   lymph nodes. No frankly pathologic adenopathy.   -  CT chest 05/28/20  Resolved or resolving nodules  (likely RA related)        Last Assessment & Plan:    Formatting of this note might be different from the original.   - 2 small pulmonary  nodules noted to left upper lobe on  PET scan 03/23/18  (6mm LUL nodule not hypermetabolic). Very low metabolic activity, not consistent with primary source   - CT 03/02/19:    1. Scattered ground-glass nodularity bilaterally, greatest in the   lower lobes subpleural distribution. Overall, pattern suggest   inflammatory or infectious change.   2. The left upper lobe nodules reported on prior PET scan are no   longer identified.   3. Likely reactive borderline enlarged mediastinal and right hilar   lymph nodes. No frankly pathologic adenopathy.   -  CT chest 05/28/20  Resolved or resolving nodules  (likely RA related)     No pulmonary f/u needed for this problem  - 2 small pulmonary nodules noted to left upper lobe on  PET scan 03/23/18  (6mm LUL nodule not hypermetabolic). Very low metabolic activity, not consistent with primary source - CT 03/02/19:  1. Scattered ground-glass nodularity bilaterally, greatest in the lower lobes subpleural distribution. Overall, pattern suggest inflammatory or infectious change. 2. The left upper lobe nodules reported on prior PET scan are no longer identified. 3. Likely reactive borderline enlarged mediastinal and right hilar lymph nodes. No frankly pathologic adenopathy. -  CT chest 05/28/20  Resolved or resolving nodules  (likely RA related)   . 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  -cT1N0M0 -she had metastatic squamous cell carcinoma to left groin lymph nodes in February 2020, PET scan was negative for primary tumor, she underwent definitive radiation.  -She was found to have anal cancer in 02/2021  -Status post concurrent chemoradiation, completed in April 2023.  -Posttreatment PET scan in July 2023 showed treatment response, no evidence of metastasis.  -Surveillance CT scan from March 15, 2022 showed unchanged  soft tissue thickening involving the low rectum and anus, no evidence of recurrent disease. Formatting of this note might be different from the original.  Last Assessment & Plan: Formatting of this note might be different from the original. Plan: Continue follow-up with oncology as well as radiation oncology Formatting of this note might be different from the original.  Formatting of this note might be different from the original.   Last Assessment & Plan: Formatting of this note might be different from the original. Plan: Continue follow-up with oncology as well as radiation oncology  Last Assessment & Plan:    Formatting of this note might be different from the original.   -cT1N0M0    -she had metastatic squamous cell carcinoma to left groin lymph nodes in February 2020, PET scan was negative for primary tumor, she underwent definitive radiation.   -She was found to have anal cancer in 02/2021   -Status post concurrent chemoradiation, completed in April 2023.   -Posttreatment PET scan in July 2023 showed treatment response, no evidence of metastasis.   -Surveillance CT scan from March 15, 2022 showed unchanged soft tissue thickening involving the low rectum and anus,  no evidence of recurrent disease.   . Pain in joint of right shoulder 02/15/2018  . History of reverse total replacement of right shoulder joint 12/30/2017  . Status post reverse total replacement of right shoulder 12/30/2017  . S/p reverse total shoulder arthroplasty 08/04/2017  . History of shoulder surgery 08/04/2017  . Radiculopathy 05/26/2017  . Aortic valve regurgitation 03/17/2017  . Laryngopharyngeal reflux (LPR) 03/14/2017  . Mild cognitive impairment 10/10/2016    Last Assessment & Plan:  Formatting of this note might be different from the original.  She has mild cognitive impairment but appears to be coping well. Formatting of this note might be different from the original.  Last Assessment & Plan: Formatting of this  note might be different from the original. She has mild cognitive impairment but appears to be coping well. Formatting of this note might be different from the original.  Formatting of this note might be different from the original.   Last Assessment & Plan: Formatting of this note might be different from the original. She has mild cognitive impairment but appears to be coping well.  Last Assessment & Plan:    Formatting of this note might be different from the original.   She has mild cognitive impairment but appears to be coping well.   . Mild neurocognitive disorder 10/10/2016  . Heart valve disease 04/06/2016    IMO 10/01 Updates Formatting of this note might be different from the original. Formatting of this note might be different from the original. IMO 10/01 Updates Formatting of this note might be different from the original. Formatting of this note might be different from the original. Formatting of this note might be different from the original. IMO 10/01 Updates Formatting of this note might be different from the original. IMO 10/01 Updates Formatting of this note might be different from the original.  Formatting of this note might be different from the original.   Formatting of this note might be different from the original. Formatting of this note might be different from the original. IMO 10/01 Updates Formatting of this note might be different from the original. IMO 10/01 Updates  Formatting of this note might be different from the original.   Formatting of this note might be different from the original. IMO 10/01 Updates  Formatting of this note might be different from the original.   IMO 10/01 Updates   Formatting of this note might be different from the original. Formatting of this note might be different from the original. IMO 10/01 Updates Formatting of this note might be different from the original. Formatting of this note might be different from the original. Formatting of this  note might be different from the original. IMO 10/01 Updates Formatting of this note might be different from the original. IMO 10/01 Updates   . Type 2 diabetes mellitus with hyperglycemia (*) 08/20/2014  . Amnesia 08/20/2014  . Memory loss 08/20/2014  . Stabbing headache 08/20/2014  . Uncontrolled type 2 diabetes mellitus with hyperglycemia (*) 08/20/2014  . Rheumatoid arthritis (*) 11/01/2013    Formatting of this note might be different from the original.  Formatting of this note might be different from the original.   Formatting of this note might be different from the original. On Methotrexate per Dr Ishmael as of ov 12/04/13 - PFT's wnl 12/04/13 with dlco 79% - MTX restarted around Nov 01 2016 - repeat pfts 01/06/2017  - PFT's  FVC 1.84 (97%)    with DLCO  67/68c % corrects to 100 % for alv volume  - PFT's  11/12/2019 FVC 1.62 with DLCO  11.31 (67%) corrects to 4.83 (116%)  for alv volume   - Last Assessment & Plan: Formatting of this note might be different from the original. On Methotrexate per Dr Ishmael as of ov 12/04/13 - PFT's wnl 12/04/13 with dlco 79% - MTX restarted around Nov 01 2016 - repeat pfts 01/06/2017  - PFT's  FVC 1.84 (97%)    with DLCO  67/68c % corrects to 100 % for alv volume  - PFT's  11/12/2019 FVC 1.62 with DLCO  11.31 (67%) corrects to 4.83 (116%)  for alv volume   No evidence of significant ILD  Related to RA or MTX or amiodarone  Patients typically have been on amiodarone  for 6-12 months before this complication manifests.  Of note, serial clinical evaluation for symptoms such as cough dyspnea or fevers is  the preferred method of monitoring for pulmonary toxicity because a decrease in DLCO or lung volumes is a nonspecific for toxicity. Pathologically amiodarone  pulmonary toxicity may appear as interstitial pneumonitis, eosinophilic pneumonia, organizing pneumonia, pulmonary fibrosis or less commonly as diffuse alveolar hemorrhage, pulmonary nodules or pleural effusions. Risk  factors for pulmonary toxicity include age greater than 60, daily dose greater than equal to 400 mg, a high cumulative dose, or pre-existing lung disease   Advised: Make sure you check your oxygen  saturations at highest level of activity to be sure it stays over 90% and keep track of it at least once a week, more often if breathing getting worse, and let me know if losing ground. Pulmonary f/u is prn Medical decision making was a moderate level of complexity in this case because of  two chronic conditions /diagnoses requiring extra time for  H and P, chart review, counseling,  and generating customized AVS unique to this office visit and charting = total time Each maintenance medication was reviewed in detail including emphasizing most importantly the difference between maintenance and prns and under what circumstances the prns are to be triggered using an action plan format where appropriate. Please see avs for details which were reviewed in writing by both me and my nurse and patient given a written copy highlighted where appropriate with yellow highlighter for the patient's continued care at home along with an updated version of their medications.  Patient was asked to maintain medication reconciliation by comparing this list to the actual medications being used at home and to contact this office right away if there is a conflict or discrepancy.  Formatting of this note might be different from the original.   On Methotrexate per Dr Ishmael as of ov 12/04/13   - PFT's wnl 12/04/13 with dlco 79%   - MTX restarted around Nov 01 2016    - repeat pfts 01/06/2017  - PFT's  FVC 1.84 (97%)    with DLCO  67/68c % corrects to 100 % for alv volume     - PFT's  11/12/2019 FVC 1.62 with DLCO  11.31 (67%) corrects to 4.83 (116%)  for alv volume     -       Last Assessment & Plan:    Formatting of this note might be different from the original.   On Methotrexate per Dr Ishmael as of ov 12/04/13   - PFT's wnl  12/04/13 with dlco 79%   - MTX restarted around Nov 01 2016    - repeat pfts 01/06/2017  - PFT's  FVC 1.84 (97%)    with DLCO  67/68c % corrects to 100 % for alv volume     - PFT's  11/12/2019 FVC 1.62 with DLCO  11.31 (67%) corrects to 4.83 (116%)  for alv volume       No evidence of significant ILD  Related to RA or MTX or amiodarone      Patients typically have been on amiodarone  for 6-12 months before this complication manifests.  Of note, serial clinical evaluation for symptoms such as cough dyspnea or fevers is  the preferred method of monitoring for pulmonary toxicity because a decrease in DLCO or lung volumes is a nonspecific for toxicity. Pathologically amiodarone  pulmonary toxicity may appear as interstitial pneumonitis, eosinophilic pneumonia, organizing pneumonia, pulmonary fibrosis or less commonly as diffuse alveolar hemorrhage, pulmonary nodules or pleural effusions.     Risk factors for pulmonary toxicity include age greater than 60, daily dose greater than equal to 400 mg, a high cumulative dose, or pre-existing lung disease       Advised:    Make sure you check your oxygen  saturations at highest level of activity to be sure it stays over 90% and keep track of it at least once a week, more often if breathing getting worse, and let me know if losing ground.      Pulmonary f/u is prn     Medical decision making was a moderate level of complexity in this case because of  two chronic conditions /diagnoses requiring extra time for  H and P, chart review, counseling,  and generating customized AVS unique to this office visit and charting = total time       Each maintenance medication was reviewed in detail including emphasizing most importantly the difference between maintenance and prns and under what circumstances the prns are to be triggered using an action plan format where appropriate. Please see avs for details which were reviewed in writing by both me and my nurse and  patient given a written copy highlighted where appropriate with yellow highlighter for the patient's continued care at home along with an updated version of their medications.  Patient was asked to maintain medication reconciliation by comparing this list to the actual medications being used at home and to contact this office right away if there is a conflict or discrepancy.   . Palpitations 02/26/2013  . SOB (shortness of breath) 10/09/2012  . Cough variant asthma (*) 07/24/2012    Formatting of this note might be different from the original. Followed in Pulmonary clinic/ [facility]/ [facility] decades prior to initial pulmonary eval 2014  - 10/18/2013  resume trial of dulera 100 2bid  -Med calendar 11/01/2013 > did not bring as instructed 12/04/13 - PFTs wnl 12/04/13 > ok to try off dulera - flare off dulera 08/14/14 > ok to resume dulera 100 2bid  - Spirometry 04/24/2015  wnl off dulera / even fef 25-75 - NO 04/24/2015  = 17  - 11/15/2016  try symbicort  80 2bid  - PFT's  01/06/2017  FEV1 1.59 (101 % ) ratio 86  p 4 % improvement from saba p symb 80 x 2 x 12h  prior to study with DLCO  67/68c % corrects to 100 % for alv volume   - 01/27/2018 cough resolved maint on just 1st gen H1 blockers per guidelines  And off inhalers  - 06/22/2018 flared off dulera > resume   - 07/12/2018   try BREO 100 one am for insurance purposes  -  12/06/2018  After extensive coaching inhaler device,  effectiveness =    90% with elipta > give breo 100 x 2 week sample then return with formulary to see NP/pharmacist for best choice. - PFT's  11/12/2019  FEV1 1.36 (91 % ) ratio 0.84  p 2 % improvement from saba p 0 prior to study with DLCO  11.31 (67%) corrects to 4.83 (116%)  for alv volume and FV curve nl    Last Assessment & Plan:  Formatting of this note might be different from the original. Onset decades prior to initial pulmonary eval 2014  - 10/18/2013  resume trial of dulera 100 2bid  -Med calendar 11/01/2013 >  did not bring as instructed 12/04/13 - PFTs wnl 12/04/13 > ok to try off dulera - flare off dulera 08/14/14 > ok to resume dulera 100 2bid  - Spirometry 04/24/2015  wnl off dulera / even fef 25-75 - NO 04/24/2015  = 17  - 11/15/2016  try symbicort  80 2bid  - PFT's  01/06/2017  FEV1 1.59 (101 % ) ratio 86  p 4 % improvement from saba p symb 80 x 2 x 12h  prior to study with DLCO  67/68c % corrects to 100 % for alv volume   - 01/27/2018 cough resolved maint on just 1st gen H1 blockers per guidelines  And off inhalers  - 06/22/2018 flared off dulera > resume   - 07/12/2018   try BREO 100 one am for insurance purposes  - 12/06/2018  After extensive coaching inhaler device,  effectiveness =    90% with elipta > give breo 100 x 2 week sample then return with formulary to see NP/pharmacist for best choice. - PFT's  11/12/2019  FEV1 1.36 (91 % ) ratio 0.84  p 2 % improvement from saba p 0 prior to study with DLCO  11.31 (67%) corrects to 4.83 (116%)  for alv volume and FV curve nl    Instead of asthma here I strongly favor Upper airway cough syndrome (previously labeled PNDS),  is so named because it's frequently impossible to sort out how much is  CR/sinusitis with freq throat clearing (which can be related to primary GERD)   vs  causing  secondary ( extra esophageal)  GERD from wide swings in gastric pressure that occur with throat clearing, often  promoting self use of mint and menthol  lozenges that reduce the lower esophageal sphincter tone and exacerbate the problem further in a cyclical fashion.   These are the same pts (now being labeled as having irritable larynx syndrome by some cough centers) who not infrequently have a history of having failed to tolerate ace inhibitors,  dry powder inhaler (like BREO) or biphosphonates or report having atypical/extraesophageal reflux symptoms that don't respond to standard doses of PPI  and are easily confused as having aecopd or asthma flares by even experienced  allergists/ pulmonologists (myself included).   Rec: Stop breo  Max rx for gerd including bed blocks/ no more tic tacs  Titrate up gabapentin  to max of 300 mg tid F/u allergy in [place] and refer to Dr GORMAN Silvan at [facility] / voice center if cough continues   Pulmonary f/u is prn Formatting of this note might be different from the original. Followed in Pulmonary clinic/ [facility]/ [facility] decades prior to initial pulmonary eval 2014 - 10/18/2013  resume trial of dulera 100 2bid -Med calendar 11/01/2013 > did not bring as instructed 12/04/13 - PFTs wnl 12/04/13 > ok to  try off dulera - flare off dulera 08/14/14 > ok to resume dulera 100 2bid - Spirometry 04/24/2015  wnl off dulera / even fef 25-75 - NO 04/24/2015  = 17 - 11/15/2016  try symbicort  80 2bid - PFT's  01/06/2017  FEV1 1.59 (101 % ) ratio 86  p 4 % improvement from saba p symb 80 x 2 x 12h  prior to study with DLCO  67/68c % corrects to 100 % for alv volume   - 01/27/2018 cough resolved maint on just 1st gen H1 blockers per guidelines  And off inhalers - 06/22/2018 flared off dulera > resume  - 07/12/2018   try BREO 100 one am for insurance purposes - 12/06/2018  After extensive coaching inhaler device,  effectiveness =    90% with elipta > give breo 100 x 2 week sample then return with formulary to see NP/pharmacist for best choice. - PFT's  11/12/2019  FEV1 1.36 (91 % ) ratio 0.84  p 2 % improvement from saba p 0 prior to study with DLCO  11.31 (67%) corrects to 4.83 (116%)  for alv volume and FV curve nl   - d/c'd breo 11/12/19 > ov 06/13/2020 with flare of wheeze and cough with nl exam > leave off breo and max rx for gerd/ cyclical coughing  With f/u [place] allergy/ent Last Assessment & Plan: Formatting of this note might be different from the original. .Onset decades prior to initial pulmonary eval 2014 - 10/18/2013  resume trial of dulera 100 2bid -Med calendar 11/01/2013 > did not bring as instructed 12/04/13 - PFTs wnl 12/04/13 > ok to try off dulera -  flare off dulera 08/14/14 > ok to resume dulera 100 2bid - Spirometry 04/24/2015  wnl off dulera / even fef 25-75 - NO 04/24/2015  = 17 - 11/15/2016  try symbicort  80 2bid - PFT's  01/06/2017  FEV1 1.59 (101 % ) ratio 86  p 4 % improvement from saba p symb 80 x 2 x 12h  prior to study with DLCO  67/68c % corrects to 100 % for alv volume   - 01/27/2018 cough resolved maint on just 1st gen H1 blockers per guidelines  And off inhalers - 06/22/2018 flared off dulera > resume  - 07/12/2018   try BREO 100 one am for insurance purposes - 12/06/2018  After extensive coaching inhaler device,  effectiveness =    90% with elipta > give breo 100 x 2 week sample then return with formulary to see NP/pharmacist for best choice. - PFT's  11/12/2019  FEV1 1.36 (91 % ) ratio 0.84  p 2 % improvement from saba p 0 prior to study with DLCO  11.31 (67%) corrects to 4.83 (116%)  for alv volume and FV curve nl   - d/c'd breo 11/12/19 > ov 06/13/2020 with flare of wheeze and cough with nl exam > leave off breo and max rx for gerd/ cyclical coughing  With f/u [place] allergy/ent see avs for instructions unique to this ov F/u here prn Each maintenance medication was reviewed in detail including emphasizing most importantly the difference between maintenance and prns and under what circumstances the prns are to be triggered using an action plan format where appropriate. Total time for H and P, chart review, counseling, reviewing hfa  device(s) and generating customized AVS unique to this office visit / same day charting  > 30 min Formatting of this note might be different from the original. Formatting of this note might be  different from the original. Followed in Pulmonary clinic/ [facility]/ [facility] decades prior to initial pulmonary eval 2014 - 10/18/2013  resume trial of dulera 100 2bid -Med calendar 11/01/2013 > did not bring as instructed 12/04/13 - PFTs wnl 12/04/13 > ok to try off dulera - flare off dulera 08/14/14 > ok to resume dulera 100  2bid - Spirometry 04/24/2015  wnl off dulera / even fef 25-75 - NO 04/24/2015  = 17 - 11/15/2016  try symbicort  80 2bid - PFT's  01/06/2017  FEV1 1.59 (101 % ) ratio 86  p 4 % improvement from saba p symb 80 x 2 x 12h  prior to study with DLCO  67/68c % corrects to 100 % for alv volume   - 01/27/2018 cough resolved maint on just 1st gen H1 blockers per guidelines  And off inhalers - 06/22/2018 flared off dulera > resume  - 07/12/2018   try BREO 100 one am for insurance purposes - 12/06/2018  After extensive coaching inhaler device,  effectiveness =    90% with elipta > give breo 100 x 2 week sample then return with formulary to see NP/pharmacist for best choice. - PFT's  11/12/2019  FEV1 1.36 (91 % ) ratio 0.84  p 2 % improvement from saba p 0 prior to study with DLCO  11.31 (67%) corrects to 4.83 (116%)  for alv volume and FV curve nl   - d/c'd breo 11/12/19 > ov 06/13/2020 with flare of wheeze and cough with nl exam > leave off breo and max rx for gerd/ cyclical coughing  With f/u [place] allergy/ent Last Assessment & Plan: Formatting of this note might be different from the original. .Onset decades prior to initial pulmonary eval 2014 - 10/18/2013  resume trial of dulera 100 2bid -Med calendar 11/01/2013 > did not bring as instructed 12/04/13 - PFTs wnl 12/04/13 > ok to try off dulera - flare off dulera 08/14/14 > ok to resume dulera 100 2bid - Spirometry 04/24/2015  wnl off dulera / even fef 25-75 - NO 04/24/2015  = 17 - 11/15/2016  try symbicort  80 2bid - PFT's  01/06/2017  FEV1 1.59 (101 % ) ratio 86  p 4 % improvement from saba p symb 80 x 2 x 12h  prior to study with DLCO  67/68c % corrects to 100 % for alv volume   - 01/27/2018 cough resolved maint on just 1st gen H1 blockers per guidelines  And off inhalers - 06/22/2018 flared off dulera > resume  - 07/12/2018   try BREO 100 one am for insurance purposes - 12/06/2018  After extensive coaching inhaler device,  effectiveness =    90% with elipta > give breo 100 x 2 week sample  then return with formulary to see NP/pharmacist for best choice. - PFT's  11/12/2019  FEV1 1.36 (91 % ) ratio 0.84  p 2 % improvement from saba p 0 prior to study with DLCO  11.31 (67%) corrects to 4.83 (116%)  for alv volume and FV curve nl   - d/c'd breo 11/12/19 > ov 06/13/2020 with flare of wheeze and cough with nl exam > leave off breo and max rx for gerd/ cyclical coughing  With f/u [place] allergy/ent see avs for instructions unique to this ov F/u here prn Each maintenance medication was reviewed in detail including emphasizing most importantly the difference between maintenance and prns and under what circumstances the prns are to be triggered using an action plan format where appropriate.  Total time for H and P, chart review, counseling, reviewing hfa  device(s) and generating customized AVS unique to this office visit / same day charting  > 30 min Formatting of this note might be different from the original. Formatting of this note might be different from the original. Followed in Pulmonary clinic/ [facility]/ [facility] decades prior to initial pulmonary eval 2014 - 10/18/2013  resume trial of dulera 100 2bid -Med calendar 11/01/2013 > did not bring as instructed 12/04/13 - PFTs wnl 12/04/13 > ok to try off dulera - flare off dulera 08/14/14 > ok to resume dulera 100 2bid - Spirometry 04/24/2015  wnl off dulera / even fef 25-75 - NO 04/24/2015  = 17 - 11/15/2016  try symbicort  80 2bid - PFT's  01/06/2017  FEV1 1.59 (101 % ) ratio 86  p 4 % improvement from saba p symb 80 x 2 x 12h  prior to study with DLCO  67/68c % corrects to 100 % for alv volume   - 01/27/2018 cough resolved maint on just 1st gen H1 blockers per guidelines  And off inhalers - 06/22/2018 flared off dulera > resume  - 07/12/2018   try BREO 100 one am for insurance purposes - 12/06/2018  After extensive coaching inhaler device,  effectiveness =    90% with elipta > give breo 100 x 2 week sample then return with formulary to see NP/pharmacist  for best choice. - PFT's  11/12/2019  FEV1 1.36 (91 % ) ratio 0.84  p 2 % improvement from saba p 0 prior to study with DLCO  11.31 (67%) corrects to 4.83 (116%)  for alv volume and FV curve nl   Last Assessment & Plan: Formatting of this note might be different from the original. Onset decades prior to initial pulmonary eval 2014 - 10/18/2013  resume trial of dulera 100 2bid -Med calendar 11/01/2013 > did not bring as instructed 12/04/13 - PFTs wnl 12/04/13 > ok to try off dulera - flare off dulera 08/14/14 > ok to resume dulera 100 2bid - Spirometry 04/24/2015  wnl off dulera / even fef 25-75 - NO 04/24/2015  = 17 - 11/15/2016  try symbicort  80 2bid - PFT's  01/06/2017  FEV1 1.59 (101 % ) ratio 86  p 4 % improvement from saba p symb 80 x 2 x 12h  prior to study with DLCO  67/68c % corrects to 100 % for alv volume   - 01/27/2018 cough resolved maint on just 1st gen H1 blockers per guidelines  And off inhalers - 06/22/2018 flared off dulera > resume  - 07/12/2018   try BREO 100 one am for insurance purposes - 12/06/2018  After extensive coaching inhaler device,  effectiveness =    90% with elipta > give breo 100 x 2 week sample then return with formulary to see NP/pharmacist for best choice. - PFT's  11/12/2019  FEV1 1.36 (91 % ) ratio 0.84  p 2 % improvement from saba p 0 prior to study with DLCO  11.31 (67%) corrects to 4.83 (116%)  for alv volume and FV curve nl   Instead of asthma here I strongly favor Upper airway cough syndrome (previously labeled PNDS),  is so named because it's frequently impossible to sort out how much is  CR/sinusitis with freq throat clearing (which can be related to primary GERD)   vs  causing  secondary ( extra esophageal)  GERD from wide swings in gastric pressure that occur with throat clearing,  often  promoting self use of mint and menthol  lozenges that reduce the lower esophageal sphincter tone and exacerbate the problem further in a cyclical fashion. These are the same pts (now being  labeled as having irritable larynx syndrome by some cough centers) who not infrequently have a history of having failed to tolerate ace inhibitors,  dry powder inhaler (like BREO) or biphosphonates or report having atypical/extraesophageal reflux symptoms that don't respond to standard doses of PPI  and are easily confused as having aecopd or asthma flares by even experienced allergists/ pulmonologists (myself included). Rec: Stop breo Max rx for gerd including bed blocks/ no more tic tacs Titrate up gabapentin  to max of 300 mg tid F/u allergy in [place] and refer to Dr GORMAN Silvan [facility] / voice center if cough continues Pulmonary f/u is prn  Followed in Pulmonary clinic/ [facility]/ [place] decades prior to initial pulmonary eval 2014  - 10/18/2013  resume trial of dulera 100 2bid  -Med calendar 11/01/2013 > did not bring as instructed 12/04/13 - PFTs wnl 12/04/13 > ok to try off dulera - flare off dulera 08/14/14 > ok to resume dulera 100 2bid  - Spirometry 04/24/2015  wnl off dulera / even fef 25-75 - NO 04/24/2015  = 17  - 11/15/2016  try symbicort  80 2bid  - PFT's  01/06/2017  FEV1 1.59 (101 % ) ratio 86  p 4 % improvement from saba p symb 80 x 2 x 12h  prior to study with DLCO  67/68c % corrects to 100 % for alv volume   - 01/27/2018 cough resolved maint on just 1st gen H1 blockers per guidelines  And off inhalers  - 06/22/2018 flared off dulera > resume   - 07/12/2018   try BREO 100 one am for insurance purposes  - 12/06/2018  After extensive coaching inhaler device,  effectiveness =    90% with elipta > give breo 100 x 2 week sample then return with formulary to see NP/pharmacist for best choice. - PFT's  11/12/2019  FEV1 1.36 (91 % ) ratio 0.84  p 2 % improvement from saba p 0 prior to study with DLCO  11.31 (67%) corrects to 4.83 (116%)  for alv volume and FV curve nl   - d/c'd breo 11/12/19 > ov 06/13/2020 with flare of wheeze and cough with nl exam > leave off breo and max rx for gerd/  cyclical coughing  With f/u [place] allergy/ent     . Posterior rhinorrhea 07/24/2012  . Upper airway cough syndrome 07/24/2012    Formatting of this note might be different from the original.  Followed in Pulmonary clinic/ [facility]/ [place]   - Note POS GERD on UGI  02/28/13   - 01/06/2017  added h2 and 1st gen H1 blockers per guidelines  At [facility] > f/u prn   - 04/21/2018  No benefit from ICS/LAMA. Symptoms facor UACS. Take protonix  40mg  TWICE daily. Take Chlorpheniramine 4mg  every 4 hours as needed for cough. Refill tessalon  perles for cough, continue delsym  cough syrup and add mucinex twice a day   - titrate up gabapentin  to max 300 mg tid/ bed blocks/ avoid mints in favor of jolly ranchers > refer to  ENT / allergy in [place]       Last Assessment & Plan:   Formatting of this note might be different from the original.  - Note POS GERD on UGI  02/28/13   - 01/06/2017  added h2 and 1st  gen H1 blockers per guidelines  At HS > f/u prn   - 04/21/2018  No benefit from ICS/LAMA. Symptoms facor UACS. Take protonix  40mg  TWICE daily. Take Chlorpheniramine 4mg  every 4 hours as needed for cough. Refill tessalon  perles for cough, continue delsym  cough syrup and add mucinex twice a day   - titrate up gabapentin  to max 300 mg tid/ bed blocks/ avoid mints in favor of jolly ranchers > refer to  ENT / allergy in [place]     Upper airway cough syndrome (previously labeled PNDS),  is so named because it's frequently impossible to sort out how much is  CR/sinusitis with freq throat clearing (which can be related to primary GERD)   vs  causing  secondary ( extra esophageal)  GERD from wide swings in gastric pressure that occur with throat clearing, often  promoting self use of mint and menthol  lozenges that reduce the lower esophageal sphincter tone and exacerbate the problem further in a cyclical fashion.     These are the same pts (now being labeled as having irritable larynx syndrome by some cough centers) who not  infrequently have a history of having failed to tolerate ace inhibitors,  dry powder inhalers or biphosphonates or report having atypical/extraesophageal reflux symptoms that don't respond to standard doses of PPI  and are easily confused as having aecopd or asthma flares by even experienced allergists/ pulmonologists (myself included).    rec max rx for gerd and titrate up gabapentin  to 300 mg tid or whatever the lowest dose is that controls the cough / pulmonary f/u is prn Formatting of this note might be different from the original.  Formatting of this note might be different from the original. Followed in Pulmonary clinic/ [facility]/ [place] - Note POS GERD on UGI  02/28/13 - 01/06/2017  added h2 and 1st gen H1 blockers per guidelines  At HS > f/u prn - 04/21/2018  No benefit from ICS/LAMA. Symptoms facor UACS. Take protonix  40mg  TWICE daily. Take Chlorpheniramine 4mg  every 4 hours as needed for cough. Refill tessalon  perles for cough, continue delsym  cough syrup and add mucinex twice a day - titrate up gabapentin  to max 300 mg tid/ bed blocks/ avoid mints in favor of jolly ranchers > refer to  ENT / allergy in [place] Last Assessment & Plan: Formatting of this note might be different from the original. - Note POS GERD on UGI  02/28/13 - 01/06/2017  added h2 and 1st gen H1 blockers per guidelines  At HS > f/u prn - 04/21/2018  No benefit from ICS/LAMA. Symptoms facor UACS. Take protonix  40mg  TWICE daily. Take Chlorpheniramine 4mg  every 4 hours as needed for cough. Refill tessalon  perles for cough, continue delsym  cough syrup and add mucinex twice a day - titrate up gabapentin  to max 300 mg tid/ bed blocks/ avoid mints in favor of jolly ranchers > refer to  ENT / allergy in [place] Upper airway cough syndrome (previously labeled PNDS),  is so named because it's frequently impossible to sort out how much is  CR/sinusitis with freq throat clearing (which can be related to primary GERD)   vs  causing  secondary ( extra  esophageal)  GERD from wide swings in gastric pressure that occur with throat clearing, often  promoting self use of mint and menthol  lozenges that reduce the lower esophageal sphincter tone and exacerbate the problem further in a cyclical fashion. These are the same pts (now being labeled as having irritable larynx syndrome by some  cough centers) who not infrequently have a history of having failed to tolerate ace inhibitors,  dry powder inhalers or biphosphonates or report having atypical/extraesophageal reflux symptoms that don't respond to standard doses of PPI  and are easily confused as having aecopd or asthma flares by even experienced allergists/ pulmonologists (myself included). rec max rx for gerd and titrate up gabapentin  to 300 mg tid or whatever the lowest dose is that controls the cough / pulmonary f/u is prn  Followed in Pulmonary clinic/ [facility]/ [place]  - Note POS GERD on UGI  02/28/13  - 01/06/2017  added h2 and 1st gen H1 blockers per guidelines  At HS > f/u prn  - 04/21/2018  No benefit from ICS/LAMA. Symptoms facor UACS. Take protonix  40mg  TWICE daily. Take Chlorpheniramine 4mg  every 4 hours as needed for cough. Refill tessalon  perles for cough, continue delsym  cough syrup and add mucinex twice a day  - titrate up gabapentin  to max 300 mg tid/ bed blocks/ avoid mints in favor of jolly ranchers > refer to  ENT / allergy in [place]   . HTN (hypertension) 02/17/2011  . Hyperlipidemia 02/17/2011    Formatting of this note might be different from the original. Last Assessment & Plan: Formatting of this note might be different from the original. Fasting lipids and liver function test to be performed  In 3  months, and return to this office for further assessment and evaluation. Pt is advised to avoid fatty and fried food. A balanced diet with increased fruits and vegetables to be followed. Appropriate cholesterol profile in patiens with history coronary artery disease: Total cholesterol                             Less than          200 LDL (bad cholesterol)                    Less than            70 HDL (good cholesterol)                 Geater than         40 TG                                                  Less than          150 Formatting of this note might be different from the original.  Formatting of this note might be different from the original.   Last Assessment & Plan: Formatting of this note might be different from the original. Fasting lipids and liver function test to be performed  In 3  months, and return to this office for further assessment and evaluation. Pt is advised to avoid fatty and fried food. A balanced diet with increased fruits and vegetables to be followed. Appropriate cholesterol profile in patiens with history coronary artery disease: Total cholesterol                            Less than          200 LDL (bad cholesterol)  Less than            70 HDL (good cholesterol)                 Geater than         40 TG                                                  Less than          150  Formatting of this note might be different from the original.   Formatting of this note might be different from the original. Last Assessment & Plan: Formatting of this note might be different from the original. Fasting lipids and liver function test to be performed  In 3  months, and return to this office for further assessment and evaluation. Pt is advised to avoid fatty and fried food. A balanced diet with increased fruits and vegetables to be followed. Appropriate cholesterol profile in patiens with history coronary artery disease: Total cholesterol                            Less than          200 LDL (bad cholesterol)                    Less than            70 HDL (good cholesterol)                 Geater than         40 TG                                                  Less than          150     Last Assessment & Plan:    Formatting of this note might be  different from the original.   Fasting lipids and liver function test to be performed  In 3  months, and return to this office for further assessment and evaluation.     Pt is advised to avoid fatty and fried food. A balanced diet with increased fruits and vegetables to be followed.      Appropriate cholesterol profile in patiens with history coronary artery disease:   Total cholesterol                            Less than          200   LDL (bad cholesterol)                    Less than            70   HDL (good cholesterol)                 Geater than         40   TG  Less than          150   . Chest pain 02/17/2011    Formatting of this note might be different from the original.  Last Assessment & Plan: Formatting of this note might be different from the original. Pt is s/p recent stress imaging without ischemia Recommend: Presently stable will continue with current management. Formatting of this note might be different from the original.  Formatting of this note might be different from the original. Last Assessment & Plan: Formatting of this note might be different from the original. Pt is s/p recent stress imaging without ischemia Recommend: Presently stable will continue with current management. Last Assessment & Plan: Formatting of this note might be different from the original. Pt is s/p recent stress imaging without ischemia Recommend: Presently stable will continue with current management.   . Essential hypertension 02/17/2011    Formatting of this note might be different from the original. Last Assessment & Plan: Formatting of this note might be different from the original. Presently stable will continue with current management. Formatting of this note might be different from the original.  Formatting of this note might be different from the original.   Last Assessment & Plan: Formatting of this note might be different from the original.  Presently stable will continue with current management.  Formatting of this note might be different from the original.   Formatting of this note might be different from the original. Last Assessment & Plan: Formatting of this note might be different from the original. Presently stable will continue with current management.  Last Assessment & Plan:    Formatting of this note might be different from the original.   Presently stable will continue with current management.   . Long term current use of anticoagulant 08/31/2010    The patient's past surgical history, past medical history, social history, family history, immunization history and health maintenance information were reviewed and are documented in the patient's electronic health record.  Patient Care Team: Alm LELON Rav, MD as PCP - General (Family Medicine) Dois JONETTA Alter, MD as Consulting Physician (Cardiology) Rankin JONETTA Maduro, FNP as Nurse Practitioner (Interventional Cardiology) Rosaline JONETTA Hedge, NP as Nurse Practitioner (Nurse Practitioner) Olam Jenkins Brewster, NP as Nurse Practitioner (Nurse Practitioner) Hadassah Rogers, PharmD as Pharmacist (Pharmacist)   I dictated a portion of the note using voice recognition software, along with smart phrases and written information directly from the patient that have been entered by myself or by my assistant or verbal information during the face-to-face time entered by my assistant and minor syntax, contextual, and spelling errors may be related to the use of this software and were not intentional. I have reviewed the information contained in this note and personally verified its accuracy.  I obtained the history of present illness and personally performed the physical exam. A written report was generated and sent electronically or by fax to the consulting physician.         [1] Allergies Allergen Reactions  . Codeine Nausea And Vomiting and Other    Pt feels as if she is  flying  Other Reaction(s): Other (See Comments)    Pt feels as if she is flying  . Ibuprofen Confusion and Other    Other Reaction(s): Confusion, Confusion (intolerance), Not available, other, Other (See Comments)    Confusion, Patient states that the 800mg  Motrin made her feel like she was flying.  . Morphine Rash and Other    Other Reaction(s): Other (  See Comments)    Patient says it makes her feel like she is flying.  Patient says it makes her feel like she is flying.  *Some images could not be shown.

## 2023-10-26 DIAGNOSIS — R569 Unspecified convulsions: Secondary | ICD-10-CM | POA: Diagnosis not present

## 2023-10-26 DIAGNOSIS — Z7409 Other reduced mobility: Secondary | ICD-10-CM | POA: Diagnosis not present

## 2023-11-03 DIAGNOSIS — E039 Hypothyroidism, unspecified: Secondary | ICD-10-CM | POA: Diagnosis not present

## 2023-11-21 ENCOUNTER — Other Ambulatory Visit: Payer: Self-pay | Admitting: Nurse Practitioner

## 2023-11-22 ENCOUNTER — Telehealth: Payer: Self-pay | Admitting: Nurse Practitioner

## 2023-11-22 ENCOUNTER — Encounter: Payer: Self-pay | Admitting: Hematology

## 2023-11-22 NOTE — Telephone Encounter (Signed)
 Spoke with son and regarding pt upcoming appt. He is ware of time and date.

## 2023-11-25 ENCOUNTER — Other Ambulatory Visit: Payer: Self-pay

## 2023-11-25 DIAGNOSIS — C210 Malignant neoplasm of anus, unspecified: Secondary | ICD-10-CM

## 2023-11-27 NOTE — Progress Notes (Unsigned)
 [facility]   [phone] Fax:([phone]    Patient Care Team: Seabron Lenis, MD as PCP - General (Family Medicine) Georjean Darice HERO, MD as Consulting Physician (Neurology) Lanny Callander, MD as Consulting Physician (Oncology) Detra Darice LABOR, RN (Inactive) as Oncology Nurse Navigator (Oncology)   CHIEF COMPLAINT: Follow up anal cancer   Oncology History Overview Note   Cancer Staging  Anal cancer [facility]) Staging form: Anus, AJCC V9 - Clinical stage from 01/30/2021: Stage I (cT1, cN0, cM0) - Signed by Lanny Callander, MD on 03/11/2021 Stage prefix: Initial diagnosis Histologic grade (G): G2 Histologic grading system: 4 grade system     Metastatic squamous cell carcinoma involving lymph node with unknown primary site [facility])  02/06/2018 Imaging   Impression CT abdomen and pelvis 1.  Enlarged left groin lymph node which is of uncertain etiology. This would be amenable to percutaneous sampling if deemed clinically appropriate. 2.  Hepatic steatosis. Focal area of low-attenuation in the far lateral left hepatic lobe, not clearly seen on the prior exam. Consider liver ultrasound for further evaluation.  3.  Mild L1 compression deformity which is new.   02/28/2018 Pathology Results   Left groin lymph node, needle core biopsy:       -Metastatic squamous cell carcinoma, see comment  P16 stain is strongly positive, suggestive of cervical or anogenital origin in this location. Immunohistochemical stains for CK5, p63, and pan cytokeratin are positive. ER is weakly positive. CK7 and CK20 are negative.   03/15/2018 Pathology Results   1. Cervix, biopsy, 12 o'clock - BENIGN SQUAMOUS MUCOSA WITH INFLAMMATION. - NO DYSPLASIA OR MALIGNANCY. 2. Endocervix, curettage - BENIGN SQUAMOUS EPITHELIUM.   03/23/2018 PET scan   1. Isolated intensely hypermetabolic enlarged LEFT inguinal lymph node. No clear primary identified. 2. No abnormal activity associated with the vagina or  anorectal tissue. 3. No hypermetabolic lymph nodes in the pelvis. 4. Two adjacent nodules in the RIGHT upper lobe with very low metabolic activity. These are not favored primary malignancies. Recommend follow-up per Fleischner criteria. Non-contrast chest CT at 6-12 months is recommended.    07/05/2018 - 07/18/2018 Radiation Therapy   Left inguinal area/nodes treated to 30 Gy in 10 fractions   09/22/2020 Imaging   EXAM: CT ABDOMEN PELVIS W IV CONTRAST   IMPRESSION:  1. Multicystic enhancing right adnexal lesion with mild surrounding stranding. Given short interval since recent CT 3 weeks prior, this is more suspicious for acute findings such as torsion or infection, less likely neoplasm. Recommend endovaginal ultrasound for further evaluation and follow-up to resolution.  2. Other stable chronic findings.    01/30/2021 Pathology Results   DIAGNOSIS   SMALL  TUFT OF TISSUE ABOVE ANAL VERGE, ENDOSCOPIC BIOPSIES:   - HIGH-GRADE SQUAMOUS INTRAEPITHELIAL LESION/ANAL INTRAEPITHELIAL NEOPLASIA 2-3 OUT OF 3 (HGSIL/AIN 2-3).   - SEE COMMENT.  ([id number])   COMMENT  MORPHOLOGICALLY THERE IS AT LEAST TWO THIRDS THICKNESS INVOLVEMENT BY DYSPLASTIC EPITHELIUM WITH MID-LEVEL MITOSES SECONDARY HOWEVER DUE TO POOR ORIENTATION AND MORPHOLOGIC ASSESSMENT IS 2 SERVICE MUCOSA IS ONLY PRESENT IN FOCAL AREAS MORPHOLOGICALLY THIS REPRESENTS AT LEAST AIN 2 AND MOST LIKELY AIN 3.  IN EITHER RESPECT THE FINDINGS ARE CONSISTENT WITH HIGH-GRADE SQUAMOUS INTRAEPITHELIAL LESION WHICH IS ALSO CONFIRMED BY STRONGLY BLOCK LIKE P16 IMMUNOHISTOCHEMICAL  POSITIVITY.    03/05/2021 Pathology Results   Final Diagnosis    Rectal mass, biopsy: -Invasive squamous cell carcinoma, moderately differentiated   Addendum 1    The carcinoma was  analyzed by IHC for DNA mismatch repair proteins.  The neoplasm retained nuclear expression of all four mismatch repair proteins, MLH1, PMS2, MSH2, MSH6.  Controls worked appropriately.     Anal  cancer (HCC)  01/30/2021 Cancer Staging   Staging form: Anus, AJCC V9 - Clinical stage from 01/30/2021: Stage I (cT1, cN0, cM0) - Signed by Lanny Callander, MD on 03/11/2021 Stage prefix: Initial diagnosis Histologic grade (G): G2 Histologic grading system: 4 grade system   03/11/2021 Initial Diagnosis   Anal cancer (HCC)   03/30/2021 - 05/01/2021 Chemotherapy   Patient is on Treatment Plan : ANUS Mitomycin  D1,28 / 5FU D1-4, 28-31 q32d     03/15/2022 Imaging    IMPRESSION: 1. Unchanged soft tissue thickening involving the low rectum and anus, in keeping with patient's known anal malignancy. 2. No evidence of lymphadenopathy or metastatic disease in the chest, abdomen, or pelvis. 3. Mild pulmonary fibrosis in a pattern with apical to basal gradient, featuring irregular peripheral interstitial opacity, septal thickening, traction bronchiectasis, and small areas of subpleural bronchiolectasis at the lung bases without clear evidence of honeycombing. Findings are consistent with probable UIP pattern fibrosis by ATS pulmonary fibrosis criteria.     01/28/2023 Imaging   CT chest abdomen and pelvis with contrast  IMPRESSION: 1. Unchanged circumferential wall thickening of the low rectum and anus, in keeping with anorectal malignancy. 2. No evidence of lymphadenopathy or metastatic disease in the chest, abdomen, or pelvis. 3. Unchanged mild bibasilar predominant pulmonary fibrosis featuring irregular peripheral interstitial opacity and septal thickening as well as traction bronchiectasis and subpleural bronchiolectasis. Findings remain consistent with probable UIP pattern fibrosis. 4. Cardiomegaly and coronary artery disease.   Aortic Atherosclerosis (ICD10-I70.0).       07/08/2023 PET scan   IMPRESSION: 1. No evidence of recurrent or metastatic disease. 2. Focal hypermetabolism in the right thyroid , corresponding to a 6 mm nodule on 01/28/2023. Typically, thyroid  ultrasound and biopsy would be  recommended. However, in a patient of this age and with comorbidities, workup is at the discretion of the referring provider. 3. Aortic atherosclerosis (ICD10-I70.0). Coronary artery calcification.        CURRENT THERAPY: Surveillance   INTERVAL HISTORY Lorraine Gilbert returns for follow up as scheduled, last seen 07/20/23. Megace  stopped after her stroke and since then appetite has gotten worse. Taking remeron  15 mg but not helping. Denies n/v, change in bowel habits, bleeding, rectal pain. Legs hurt since stroke. She sees PCP and is scheduled with Dr. Ladena next month.   ROS  All other systems reviewed and negative   Past Medical History:  Diagnosis Date   A-fib (HCC)    Abnormal heart rhythm    Allergic rhinitis    anal ca 01/2018   with mets to left inguinal ln 01/2018   Anxiety    Asthma    Diabetes mellitus without complication (HCC)    Type II   GERD (gastroesophageal reflux disease)    Headache    History of radiation therapy 07/05/2018-07/18/2018   left pelvis        Dr Lynwood Nasuti   History of radiation therapy    Anus- 03/30/21-05/07/21- Dr. Lynwood Nasuti   Hypertension    Neuromuscular disorder [facility])    Siactic pain in right leg   Osteopenia    Rheumatoid arthritis (HCC)    Rotator cuff tear arthropathy    Right   Seizures (HCC)    Vertigo      Past Surgical History:  Procedure Laterality Date  BIOPSY  12/13/2018   Procedure: BIOPSY;  Surgeon: Lennard Lesta FALCON, MD;  Location: [facility];  Service: Endoscopy;;   CATARACT EXTRACTION     bilateral   COLONOSCOPY WITH PROPOFOL  N/A 12/13/2018   Procedure: COLONOSCOPY WITH PROPOFOL ;  Surgeon: Lennard Lesta FALCON, MD;  Location: [facility];  Service: Endoscopy;  Laterality: N/A;   ESOPHAGOGASTRODUODENOSCOPY N/A 12/13/2018   Procedure: ESOPHAGOGASTRODUODENOSCOPY (EGD);  Surgeon: Lennard Lesta FALCON, MD;  Location: [facility];  Service: Endoscopy;  Laterality: N/A;   EYE SURGERY     POLYPECTOMY  12/13/2018   Procedure:  POLYPECTOMY;  Surgeon: Lennard Lesta FALCON, MD;  Location: [facility];  Service: Endoscopy;;   REVERSE SHOULDER ARTHROPLASTY Right 08/04/2017   REVERSE SHOULDER ARTHROPLASTY Right 08/04/2017   Procedure: RIGHT REVERSE SHOULDER ARTHROPLASTY;  Surgeon: Melita Drivers, MD;  Location: [facility];  Service: Orthopedics;  Laterality: Right;   TUBAL LIGATION       Outpatient Encounter Medications as of 11/28/2023  Medication Sig Note   ACCU-CHEK AVIVA PLUS test strip 1 each as directed.    Accu-Chek Softclix Lancets lancets 1 each by Other route as directed.    ACETAMINOPHEN  8 HOUR PO Take 1 tablet by mouth as needed.    albuterol (PROVENTIL) (2.5 MG/3ML) 0.083% nebulizer solution SMARTSIG:1 Vial(s) Via Nebulizer Every 4-6 Hours PRN    Alcohol Swabs (ALCOHOL WIPES) 70 % PADS DropSafe Alcohol Prep Pads    amiodarone  (PACERONE ) 100 MG tablet Take 100 mg by mouth daily.    Blood Glucose Calibration (ACCU-CHEK AVIVA) SOLN     Blood Glucose Monitoring Suppl (TRUE METRIX AIR GLUCOSE METER) w/Device KIT See admin instructions.    ELIQUIS  2.5 MG TABS tablet Take 2.5 mg by mouth 2 (two) times daily.    levalbuterol  (XOPENEX ) 1.25 MG/3ML nebulizer solution Inhale into the lungs.    levETIRAcetam  (KEPPRA ) 500 MG tablet Take 1 tablet (500 mg total) by mouth 2 (two) times daily.    nitroGLYCERIN (NITROSTAT) 0.4 MG SL tablet Place 0.4 mg under the tongue every 5 (five) minutes as needed for chest pain.    polyethylene glycol powder (GLYCOLAX /MIRALAX ) 17 GM/SCOOP powder Take by mouth.    TRELEGY ELLIPTA 200-62.5-25 MCG/ACT AEPB Take 1 puff by mouth daily.    [DISCONTINUED] megestrol  (MEGACE ) 400 MG/10ML suspension Take 10 mLs (400 mg total) by mouth daily. 11/28/2023: stroke   [DISCONTINUED] mirtazapine  (REMERON ) 15 MG tablet TAKE 1 TABLET BY MOUTH EVERY DAY AT BEDTIME    mirtazapine  (REMERON ) 30 MG tablet Take 1 tablet (30 mg total) by mouth at bedtime.    No facility-administered encounter medications on file as of  11/28/2023.     Today's Vitals   11/28/23 0926 11/28/23 0933  BP: 120/68   Pulse: 71   Resp: 17   Temp: (!) 97.4 F (36.3 C)   SpO2: 99%   Weight: 125 lb (56.7 kg)   PainSc:  0-No pain   Body mass index is 22.14 kg/m.    PHYSICAL EXAM GENERAL:alert, no distress and comfortable SKIN: no rash  EYES: sclera clear NECK: without mass LYMPH:  no palpable cervical or supraclavicular lymphadenopathy  LUNGS: clear with normal breathing effort HEART: regular rate & rhythm, no lower extremity edema ABDOMEN: abdomen soft, non-tender and normal bowel sounds Rectal exam: no inguinal adenopathy or external mass. Internal exam reveals normal sphincter tone, no palpable mass.  No blood on glove NEURO: alert & oriented x 3 with fluent speech, no focal motor deficits  Chaperone Lorraine Gilbert CMA present for  exam  CBC    Latest Ref Rng & Units 11/28/2023    9:03 AM 07/20/2023    9:55 AM 05/23/2023    8:52 AM  CBC  WBC 4.0 - 10.5 K/uL 5.9  5.7  7.1   Hemoglobin 12.0 - 15.0 g/dL 88.3  85.9  88.3   Hematocrit 36.0 - 46.0 % 35.6  43.4  36.1   Platelets 150 - 400 K/uL 258  185  315       CMP     Latest Ref Rng & Units 11/28/2023    9:03 AM 07/20/2023    9:55 AM 05/23/2023    8:52 AM  CMP  Glucose 70 - 99 mg/dL 891  865  715   BUN 8 - 23 mg/dL 18  18  26    Creatinine 0.44 - 1.00 mg/dL 8.87  8.95  8.88   Sodium 135 - 145 mmol/L 141  143  138   Potassium 3.5 - 5.1 mmol/L 4.6  4.8  4.2   Chloride 98 - 111 mmol/L 107  107  107   CO2 22 - 32 mmol/L 26  27  23    Calcium 8.9 - 10.3 mg/dL 9.6  9.9  9.5   Total Protein 6.5 - 8.1 g/dL 7.7  8.0  7.4   Total Bilirubin 0.0 - 1.2 mg/dL 0.4  0.4  0.4   Alkaline Phos 38 - 126 U/L 81  87  74   AST 15 - 41 U/L 31  30  24    ALT 0 - 44 U/L 23  45  21       ASSESSMENT & PLAN: 89 year old female  Anal cancer  Metastatic squamous cell carcinoma involving lymph node with unknown primary site, cT1N0M0  -she had metastatic squamous cell carcinoma to left  groin lymph nodes in February 2020, PET scan was negative for primary tumor, she underwent definitive radiation. -She was found to have anal cancer in 02/2021 -Status post concurrent chemoradiation, completed in April 2023. -Posttreatment PET scan in July 2023 showed treatment response, no evidence of metastasis. -Surveillance CT scan from March 15, 2022 showed unchanged soft tissue thickening involving the low rectum and anus, no evidence of recurrent disease.  -01/28/2023 - restaging CT CAP - Unchanged circumferential wall thickening of the low rectum and anus. No evidence of lymphadenopathy or metastatic disease in the chest, abdomen, or pelvis. - PET 07/08/2023 NED  - Lorraine Gilbert is clinically doing well, exam is benign, labs are stable.  Overall no clinical concern for anal cancer recurrence.  Will increase mirtazapine  to 30 mg to see if that helps her appetite, weight is stable in the past 6 months.   PLAN: -Continue anal cancer surveillance -Keep follow-up with Dr. Ladena 12/20/2023 -Increase mirtazapine  to 30 mg, Rx sent -Return for follow-up and surveillance scan in 6 months, or sooner if needed  Orders Placed This Encounter  Procedures   CT ABDOMEN PELVIS W CONTRAST    Standing Status:   Future    Expected Date:   05/27/2024    Expiration Date:   11/27/2024    If indicated for the ordered procedure, I authorize the administration of contrast media per Radiology protocol:   Yes    Does the patient have a contrast media/X-ray dye allergy?:   No    Preferred imaging location?:   [facility]    If indicated for the ordered procedure, I authorize the administration of oral contrast media per Radiology protocol:   Yes  All questions were answered. The patient knows to call the clinic with any problems, questions or concerns. No barriers to learning were detected.   Yanilen Adamik K Berit Raczkowski, NP 11/28/2023

## 2023-11-28 ENCOUNTER — Encounter: Payer: Self-pay | Admitting: Nurse Practitioner

## 2023-11-28 ENCOUNTER — Inpatient Hospital Stay: Attending: Nurse Practitioner

## 2023-11-28 ENCOUNTER — Inpatient Hospital Stay: Admitting: Nurse Practitioner

## 2023-11-28 VITALS — BP 120/68 | HR 71 | Temp 97.4°F | Resp 17 | Wt 125.0 lb

## 2023-11-28 DIAGNOSIS — Z85048 Personal history of other malignant neoplasm of rectum, rectosigmoid junction, and anus: Secondary | ICD-10-CM | POA: Insufficient documentation

## 2023-11-28 DIAGNOSIS — C779 Secondary and unspecified malignant neoplasm of lymph node, unspecified: Secondary | ICD-10-CM

## 2023-11-28 DIAGNOSIS — C210 Malignant neoplasm of anus, unspecified: Secondary | ICD-10-CM

## 2023-11-28 DIAGNOSIS — C801 Malignant (primary) neoplasm, unspecified: Secondary | ICD-10-CM

## 2023-11-28 DIAGNOSIS — Z9221 Personal history of antineoplastic chemotherapy: Secondary | ICD-10-CM | POA: Diagnosis not present

## 2023-11-28 DIAGNOSIS — Z923 Personal history of irradiation: Secondary | ICD-10-CM | POA: Diagnosis not present

## 2023-11-28 LAB — CBC WITH DIFFERENTIAL (CANCER CENTER ONLY)
Abs Immature Granulocytes: 0.01 K/uL (ref 0.00–0.07)
Basophils Absolute: 0 K/uL (ref 0.0–0.1)
Basophils Relative: 0 %
Eosinophils Absolute: 0.1 K/uL (ref 0.0–0.5)
Eosinophils Relative: 2 %
HCT: 35.6 % — ABNORMAL LOW (ref 36.0–46.0)
Hemoglobin: 11.6 g/dL — ABNORMAL LOW (ref 12.0–15.0)
Immature Granulocytes: 0 %
Lymphocytes Relative: 19 %
Lymphs Abs: 1.1 K/uL (ref 0.7–4.0)
MCH: 31.2 pg (ref 26.0–34.0)
MCHC: 32.6 g/dL (ref 30.0–36.0)
MCV: 95.7 fL (ref 80.0–100.0)
Monocytes Absolute: 0.6 K/uL (ref 0.1–1.0)
Monocytes Relative: 11 %
Neutro Abs: 4 K/uL (ref 1.7–7.7)
Neutrophils Relative %: 68 %
Platelet Count: 258 K/uL (ref 150–400)
RBC: 3.72 MIL/uL — ABNORMAL LOW (ref 3.87–5.11)
RDW: 13.7 % (ref 11.5–15.5)
WBC Count: 5.9 K/uL (ref 4.0–10.5)
nRBC: 0 % (ref 0.0–0.2)

## 2023-11-28 LAB — CMP (CANCER CENTER ONLY)
ALT: 23 U/L (ref 0–44)
AST: 31 U/L (ref 15–41)
Albumin: 4.2 g/dL (ref 3.5–5.0)
Alkaline Phosphatase: 81 U/L (ref 38–126)
Anion gap: 8 (ref 5–15)
BUN: 18 mg/dL (ref 8–23)
CO2: 26 mmol/L (ref 22–32)
Calcium: 9.6 mg/dL (ref 8.9–10.3)
Chloride: 107 mmol/L (ref 98–111)
Creatinine: 1.12 mg/dL — ABNORMAL HIGH (ref 0.44–1.00)
GFR, Estimated: 47 mL/min — ABNORMAL LOW (ref >60)
Glucose, Bld: 108 mg/dL — ABNORMAL HIGH (ref 70–99)
Potassium: 4.6 mmol/L (ref 3.5–5.1)
Sodium: 141 mmol/L (ref 135–145)
Total Bilirubin: 0.4 mg/dL (ref 0.0–1.2)
Total Protein: 7.7 g/dL (ref 6.5–8.1)

## 2023-11-28 LAB — CEA (ACCESS): CEA (CHCC): 5.6 ng/mL — ABNORMAL HIGH (ref 0.00–5.00)

## 2023-11-28 MED ORDER — MIRTAZAPINE 30 MG PO TABS: 30.0000 mg | ORAL_TABLET | Freq: Every day | ORAL | 1 refills | Status: AC

## 2024-02-22 ENCOUNTER — Other Ambulatory Visit: Payer: Self-pay | Admitting: Family Medicine

## 2024-02-22 DIAGNOSIS — Z1231 Encounter for screening mammogram for malignant neoplasm of breast: Secondary | ICD-10-CM

## 2024-03-05 ENCOUNTER — Ambulatory Visit

## 2024-05-30 ENCOUNTER — Inpatient Hospital Stay

## 2024-06-06 ENCOUNTER — Inpatient Hospital Stay: Admitting: Hematology

## 2024-07-26 ENCOUNTER — Ambulatory Visit: Payer: Self-pay | Admitting: Neurology
